# Patient Record
Sex: Male | Born: 1965 | Race: White | Hispanic: No | Marital: Single | Smoking: Never smoker
Health system: Southern US, Community
[De-identification: ages and names within clinical notes are randomized; demographics above are authoritative.]

## PROBLEM LIST (undated history)

## (undated) DIAGNOSIS — K219 Gastro-esophageal reflux disease without esophagitis: Secondary | ICD-10-CM

## (undated) DIAGNOSIS — G8929 Other chronic pain: Secondary | ICD-10-CM

## (undated) DIAGNOSIS — I10 Essential (primary) hypertension: Secondary | ICD-10-CM

## (undated) DIAGNOSIS — I614 Nontraumatic intracerebral hemorrhage in cerebellum: Secondary | ICD-10-CM

## (undated) DIAGNOSIS — R519 Headache, unspecified: Secondary | ICD-10-CM

## (undated) DIAGNOSIS — I2119 ST elevation (STEMI) myocardial infarction involving other coronary artery of inferior wall: Secondary | ICD-10-CM

## (undated) DIAGNOSIS — M545 Low back pain: Secondary | ICD-10-CM

## (undated) DIAGNOSIS — R51 Headache: Secondary | ICD-10-CM

## (undated) DIAGNOSIS — I639 Cerebral infarction, unspecified: Secondary | ICD-10-CM

## (undated) HISTORY — PX: BACK SURGERY: SHX140

---

## 2004-04-09 DIAGNOSIS — I639 Cerebral infarction, unspecified: Secondary | ICD-10-CM

## 2004-04-09 HISTORY — DX: Cerebral infarction, unspecified: I63.9

## 2004-04-09 HISTORY — PX: VENTRICULOSTOMY: SUR1435

## 2004-04-09 HISTORY — PX: OTHER SURGICAL HISTORY: SHX169

## 2004-05-10 HISTORY — PX: TRACHEOSTOMY: SUR1362

## 2004-05-15 ENCOUNTER — Ambulatory Visit: Payer: Self-pay | Admitting: Cardiology

## 2004-05-16 ENCOUNTER — Ambulatory Visit: Payer: Self-pay | Admitting: Physical Medicine & Rehabilitation

## 2004-05-16 ENCOUNTER — Ambulatory Visit: Payer: Self-pay | Admitting: Critical Care Medicine

## 2004-05-16 ENCOUNTER — Encounter (INDEPENDENT_AMBULATORY_CARE_PROVIDER_SITE_OTHER): Payer: Self-pay | Admitting: Specialist

## 2004-05-16 ENCOUNTER — Inpatient Hospital Stay (HOSPITAL_COMMUNITY): Admission: EM | Admit: 2004-05-16 | Discharge: 2004-06-15 | Payer: Self-pay | Admitting: Emergency Medicine

## 2004-05-19 ENCOUNTER — Encounter: Payer: Self-pay | Admitting: Cardiology

## 2004-06-07 ENCOUNTER — Ambulatory Visit: Payer: Self-pay | Admitting: Pulmonary Disease

## 2004-06-15 ENCOUNTER — Inpatient Hospital Stay (HOSPITAL_COMMUNITY)
Admission: AD | Admit: 2004-06-15 | Discharge: 2004-08-04 | Payer: Self-pay | Admitting: Physical Medicine & Rehabilitation

## 2004-06-15 ENCOUNTER — Ambulatory Visit: Payer: Self-pay | Admitting: Physical Medicine & Rehabilitation

## 2004-08-11 ENCOUNTER — Ambulatory Visit: Payer: Self-pay | Admitting: Family Medicine

## 2004-09-01 ENCOUNTER — Inpatient Hospital Stay (HOSPITAL_COMMUNITY): Admission: EM | Admit: 2004-09-01 | Discharge: 2004-09-03 | Payer: Self-pay | Admitting: Emergency Medicine

## 2004-09-01 ENCOUNTER — Encounter
Admission: RE | Admit: 2004-09-01 | Discharge: 2004-11-30 | Payer: Self-pay | Admitting: Physical Medicine & Rehabilitation

## 2004-09-03 ENCOUNTER — Ambulatory Visit: Payer: Self-pay | Admitting: Internal Medicine

## 2004-09-05 ENCOUNTER — Ambulatory Visit: Payer: Self-pay | Admitting: Physical Medicine & Rehabilitation

## 2004-09-06 ENCOUNTER — Ambulatory Visit (HOSPITAL_COMMUNITY)
Admission: RE | Admit: 2004-09-06 | Discharge: 2004-09-06 | Payer: Self-pay | Admitting: Physical Medicine & Rehabilitation

## 2004-10-19 ENCOUNTER — Ambulatory Visit: Payer: Self-pay | Admitting: Physical Medicine & Rehabilitation

## 2004-11-09 ENCOUNTER — Emergency Department (HOSPITAL_COMMUNITY): Admission: EM | Admit: 2004-11-09 | Discharge: 2004-11-09 | Payer: Self-pay | Admitting: Emergency Medicine

## 2005-01-22 ENCOUNTER — Encounter
Admission: RE | Admit: 2005-01-22 | Discharge: 2005-04-22 | Payer: Self-pay | Admitting: Physical Medicine & Rehabilitation

## 2005-01-22 ENCOUNTER — Ambulatory Visit: Payer: Self-pay | Admitting: Physical Medicine & Rehabilitation

## 2005-04-17 ENCOUNTER — Ambulatory Visit: Payer: Self-pay | Admitting: Physical Medicine & Rehabilitation

## 2005-04-20 IMAGING — CR DG ABDOMEN 1V
1 series · 1 of 1 positions shown · non-contrast
Comparison: 06/16/04

CLINICAL DATA: 38 year-old male with nausea and vomiting 
 TMAOXYE-5 VIEW:

[t abdomen supine *]
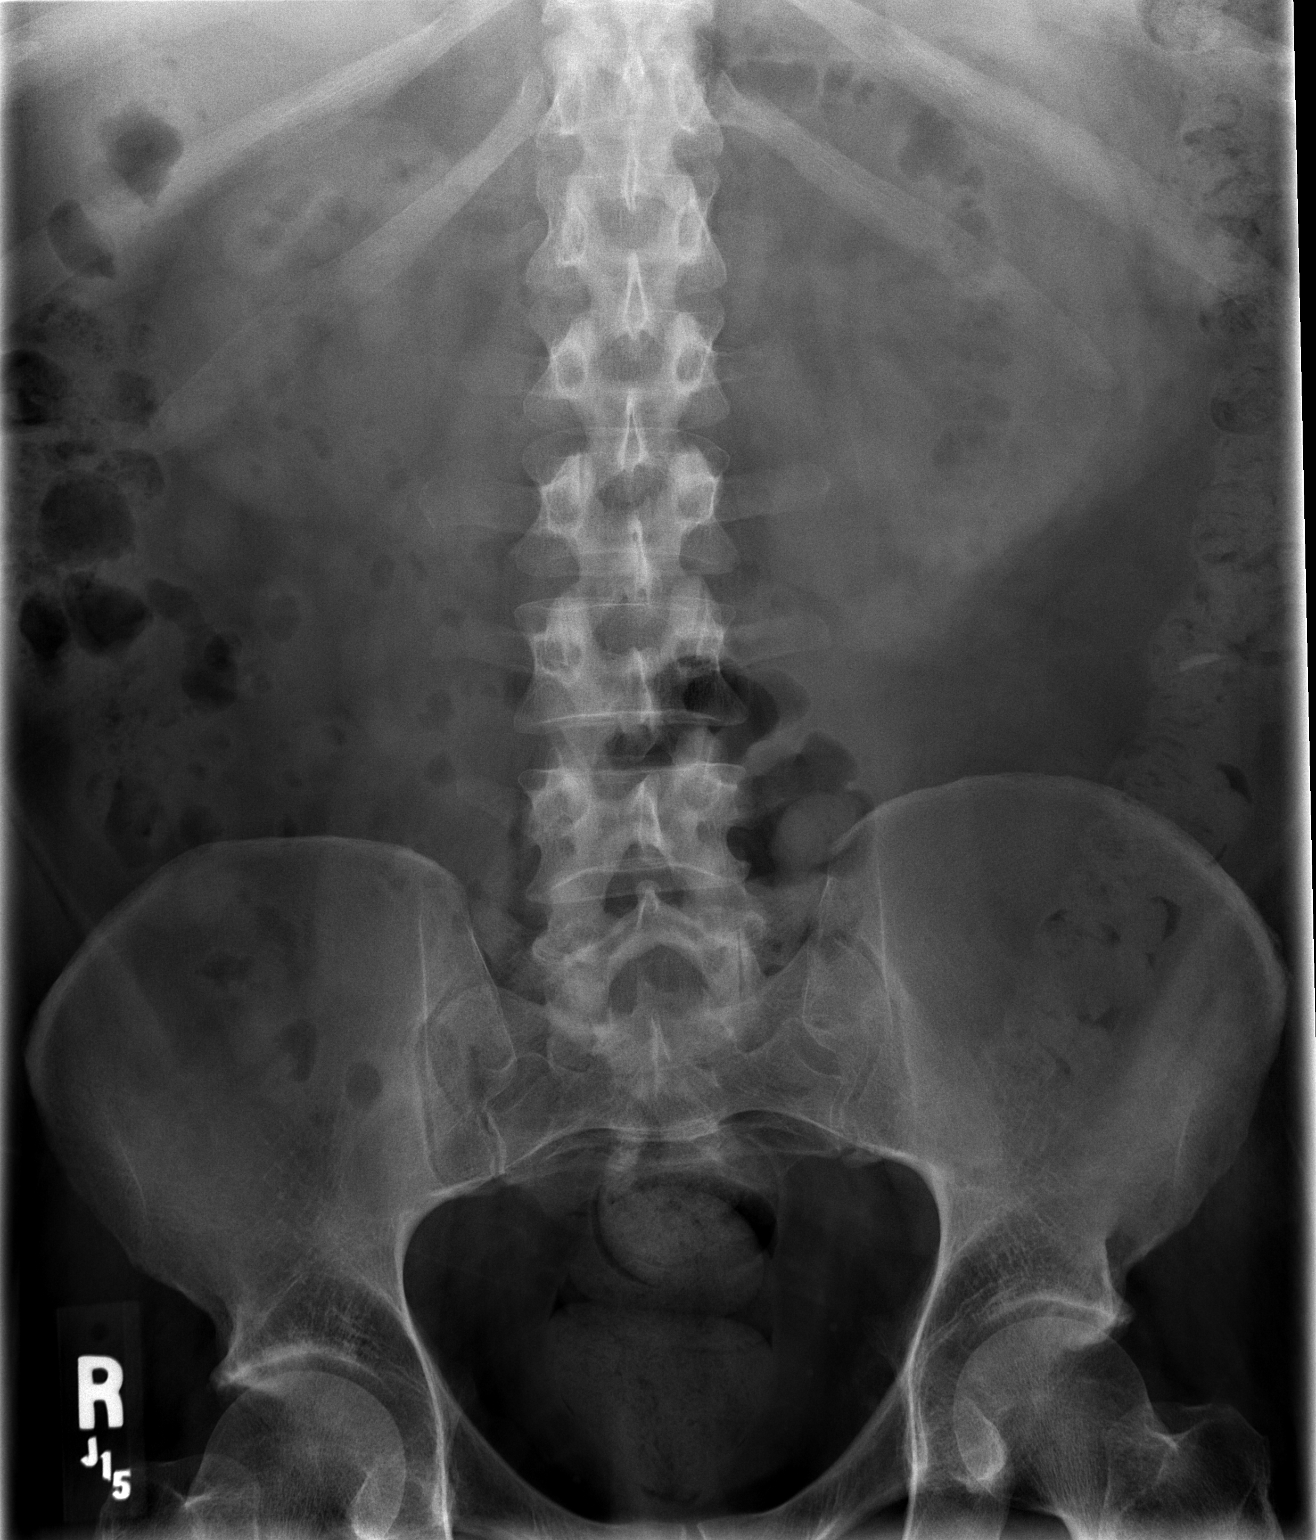

[1 of 1 positions shown; findings below may reference images not displayed]

FINDINGS: No bowel dilatation or obstruction.  Mild constipation throughout the colon.  No abnormal calcifications over the kidneys.
IMPRESSION: 1.  No obstruction or dilatation.
 2.  Mild constipation.

## 2005-09-12 ENCOUNTER — Ambulatory Visit: Payer: Self-pay | Admitting: Family Medicine

## 2005-10-16 ENCOUNTER — Ambulatory Visit: Payer: Self-pay | Admitting: Physical Medicine & Rehabilitation

## 2005-10-16 ENCOUNTER — Encounter
Admission: RE | Admit: 2005-10-16 | Discharge: 2006-01-14 | Payer: Self-pay | Admitting: Physical Medicine & Rehabilitation

## 2005-12-27 ENCOUNTER — Ambulatory Visit: Payer: Self-pay | Admitting: Family Medicine

## 2006-01-28 ENCOUNTER — Ambulatory Visit: Payer: Self-pay | Admitting: Family Medicine

## 2006-03-26 ENCOUNTER — Encounter
Admission: RE | Admit: 2006-03-26 | Discharge: 2006-06-24 | Payer: Self-pay | Admitting: Physical Medicine & Rehabilitation

## 2006-03-26 ENCOUNTER — Ambulatory Visit: Payer: Self-pay | Admitting: Physical Medicine & Rehabilitation

## 2006-05-09 ENCOUNTER — Ambulatory Visit: Payer: Self-pay | Admitting: Family Medicine

## 2007-02-27 ENCOUNTER — Emergency Department (HOSPITAL_COMMUNITY): Admission: EM | Admit: 2007-02-27 | Discharge: 2007-02-27 | Payer: Self-pay | Admitting: Emergency Medicine

## 2007-06-13 ENCOUNTER — Emergency Department (HOSPITAL_COMMUNITY): Admission: EM | Admit: 2007-06-13 | Discharge: 2007-06-13 | Payer: Self-pay | Admitting: Family Medicine

## 2007-11-25 ENCOUNTER — Emergency Department (HOSPITAL_COMMUNITY): Admission: EM | Admit: 2007-11-25 | Discharge: 2007-11-25 | Payer: Self-pay | Admitting: Emergency Medicine

## 2008-03-21 ENCOUNTER — Emergency Department (HOSPITAL_COMMUNITY): Admission: EM | Admit: 2008-03-21 | Discharge: 2008-03-21 | Payer: Self-pay | Admitting: Emergency Medicine

## 2008-03-22 ENCOUNTER — Emergency Department (HOSPITAL_COMMUNITY): Admission: EM | Admit: 2008-03-22 | Discharge: 2008-03-22 | Payer: Self-pay | Admitting: Emergency Medicine

## 2009-09-04 ENCOUNTER — Emergency Department (HOSPITAL_COMMUNITY): Admission: EM | Admit: 2009-09-04 | Discharge: 2009-09-04 | Payer: Self-pay | Admitting: Emergency Medicine

## 2010-08-25 NOTE — H&P (Signed)
NAME:  George Villa, George Villa NO.:  [id number]   MEDICAL RECORD NO.:  [id number]          PATIENT TYPE:  INP   LOCATION:  1826                         FACILITY:  [facility]   PHYSICIAN:  Hewitt Shorts, M.D.DATE OF BIRTH:  05/15/2006   DATE OF ADMISSION:  05/15/2004  DATE OF DISCHARGE:                                HISTORY & PHYSICAL   HISTORY OF PRESENTING ILLNESS:  This patient is a white man in his late 3s  who is brought in by EMS after having vomited earlier this evening and  having been found with decreased response.  The patient was brought to the  emergency room by EMS as noted above and his initial blood pressure per EMS  was 220/170.  He was evaluated in the emergency room by Dr. Bruce Donath who  found decreased response, weakness on the left side and garbled speech.  He  obtained laboratories and a CT scan of the brain.  CT revealed a large right  cerebellar hematoma with early hydrocephalus and neurosurgical consultation  was requested.  At that time Dr. Freida Busman felt that the patient's response  continued to decline and proceeded with intubation of the patient having  given him etomidate.   Further history is rather limited; however, he apparently lives with a  couple and their child.  The patient had been found by the wife of the  couple to have vomited and then she found him with decreased response.  They  know that he has a history of hypertension and has had a previous  hospitalization for that.  The husband of the couple believes that he also  has had some heart problems, but he is unsure of any medications that he  takes or of any allergies; and, he is not aware of other medical conditions  such as diabetes, cancer, previous stroke, peptic ulcer disease, or lung  disease.  He is unaware of previous surgeries.  He is unaware of any  allergies to medications and he is unsure of any medications the patient  takes on a regular basis.   FAMILY HISTORY:  The  family history is limited as well.  According to the  husband of the couple with whom he lives the patient's parents are living as  are three of his brothers, but no medical information regarding them is  available.   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   REVIEW OF SYSTEMS:  The review of systems is unobtainable.   PHYSICAL EXAMINATION:  GENERAL APPEARANCE:  The patient is a well-developed,  well-nourished white male intubated on a ventilator.  VITAL SIGNS:  Temperature is 97.1 and pulse 89.  Blood pressure on arrival  was 153/132 and is now189/122.  LUNGS:  The lungs are clear to auscultation.  He has symmetrical expiratory  excursion.  HEART:  The heart has a regular rate and rhythm.  S1 and  S2.  There is no  murmur.  ABDOMEN:  The abdomen is soft, nondistended and nontender.  Bowel sounds are  diminished.  EXTREMITIES:  The extremity examination shows no clubbing, cyanosis or  edema.  NEUROLOGIC:  Neurological examination shows on mental status he opens his  eyes to stimulation and follows some simple commands. Pupils; on the left  2.5 and on the right it is 1.5, and they are round and react to light.  He  has a right VITH nerve paresis.  Corneals are present, but more vigorous on  the left than the right.  Motor examination reveals significant left  hemiparesis.   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   PLAN:  The patient is admitted to be taken to the operating room for  evaluation of the hematoma and placement of an intraventricular catheter  with subsequent intensive care unit care.   I spoke with the husband of the family that the patient lives with as well  as another friend.   No family of the patient was available.  The patient's  care is emergent at this time and his prognosis is guarded.      RWN/MEDQ  D:  05/16/2004  T:  05/16/2004  Job:  [id number]

## 2010-08-25 NOTE — Assessment & Plan Note (Signed)
HISTORY OF PRESENT ILLNESS:  George Villa is back regarding his right cerebellar  hematoma. He was on the rehabilitation floor for approximately 1 months  time. He was discharged to home about 3 weeks ago. Since he has been home,  he has had 2 episodes where he became unresponsive. The last one was  Thursday of last week. We advised the patient to do to the emergency room.  He was admitted for 3 days at [facility] where his blood pressure  medications were adjusted. It was felt that he had orthostasis leading up to  these problems. He did not have an EEG performed. The patient apparently had  his Avapro reduced to 150 mg a day and his Clonidine patch was reduced  possibly to 0.2 mg. The patient has been progressing from a therapy  standpoint. He was discharged from home therapies. He has been walking with  a weighted walker. He has not fallen as of yet. He is still living with a  roommate. His brothers remain very supportive. The patient denies any pain.  He has had no headaches. Sleeping fairly well. They tried to remove his  Scopolamine patch a couple of weeks ago but then his nausea resurfaced. The  patient is able to walk about 15 minutes without stopping. He is independent  with all of his mobility and home needs.   REVIEW OF SYSTEMS:  The patient reports periodic dizziness. Denies any  constitutional, genitourinary, gastrointestinal, cardiorespiratory problems.   SOCIAL HISTORY:  The patient is divorced and lives with his friend, as  mentioned above.   PHYSICAL EXAMINATION:  VITAL SIGNS:  Blood pressure 108/57, pulse 97,  respiratory rate 18, sating 97% on room air.  GENERAL:  The patient is pleasant. No acute distress.  NEUROLOGIC:  He is alert and oriented x3. He seems to lack some social  awareness. He occasionally confabulates. Gait was examined with a walker  today and he did well on a straight forward walking pattern. He had problems  with changing directions. He was a  bit impulsive with his transfers and  reached for the walker instead of the side arms of the chair. Speech was  clear and articulate. Coordination was decreased particularly involving the  right hand. The patient did tend to drift to the right when he walked.  Reflexes were hyperactive on the right side. As mentioned above, the patient  lacks awareness in memory. Seemed to have better insight and a little less  impulsive than previously when I examined him but remains impulsive  nonetheless. Cranial nerve examination is grossly intact.  HEART:  Regular rate and rhythm.  LUNGS:  Clear.  ABDOMEN:  Soft, nontender.  EXTREMITIES:  Sensory examination is grossly intact on the right side.   ASSESSMENT:  1. 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  2. History of vestibular symptoms secondary to right cerebellar      hemorrhage.  3. Syncope/hypovolemia, secondary to blood pressure medications.     PLAN:  1. Continue Avapro at a 75 mg dose once daily.  2. Continue other medications per [facility] discharge to      include:      a. Maxzide 25/37.5 once daily.      b. Catapres 0.3 mg daily (which apparently was not decreased).      c. Baclofen 10 mg b.i.d.      d. Protonix 40 mg q.d.      e. Ditropan 2.5 mg t.i.d.  f. Labetalol 200 mg b.i.d.      g. Scopolamine patch q. 3 days for nausea.  3. I will send the patient for an electroencephalogram to rule out seizure      activity, although this is precautionary more than anything else.  4. Will see the patient back in about 4 to 6 weeks time to assess blood      pressure and neurologic status.  5. Encouraged better safety judgment.  6. I discussed the situation with both the patient and his brother.        ZTS/MedQ  D:  09/05/2004 15:12:55  T:  09/05/2004 16:22:05  Job #:  [id number]   cc:   Hewitt Shorts, M.D.  [address]  [place]  [place] [address]  Fax: [phone]

## 2010-08-25 NOTE — Procedures (Signed)
CLINICAL HISTORY:  The patient is a 45 year old gentleman who had a stroke  Februaryof 2000. He is right handed and walks with a walker. The patient has  had two episodes of unresponsiveness that lasted for seconds. EEG was done  to look for the presence of a seizure disorder.   PROCEDURE:  The tracing was carried out on a 32-channel digital Cadwell  recorder reformatted into 16 channel montages with one devoted to EKG. The  patient was awake and drowsy during the recording. The international 10/20  system lead placement was used. Medications were not specified in the  information available to me at the time of this dictation.   DESCRIPTION OF FINDINGS:  The dominant frequency is a 10 Hz, 25 Mv activity  that is well regulated, broadly distributed and attenuates partially with  eye opening.   Background activity shows occasional dysrhythmic theta and polymorphic delta  range activity over the left anterior temporal region that is not seen on  the right.   The patient becomes drowsy with a more generalized theta range activity with  occasional upper delta range figures. Light natural sleep was not achieved.   There was no interictal epileptiform activity in the form of spikes or sharp  waves. Activating procedures did not cause significant driving response for  photic stimulation or slowing of the background for hyperventilation.   EKG showed a regular sinus rhythm with ventricular response of 60 beats per  minute.   IMPRESSION:  Abnormal EEG on the basis of mild left-sided slowing that may  correlate with the patient's history of seizures. No seizure activity was  seen in this record.       EAV:WUJW  D:  09/06/2004 15:33:55  T:  09/06/2004 16:29:36  Job #:  [id number]   cc:   Ranelle Oyster, M.D.  [address] [place]  [place] [address]  Fax: [phone]

## 2010-08-25 NOTE — Consult Note (Signed)
NAME:  GROVER, ROBINSON NO.:  [id number]   MEDICAL RECORD NO.:  [id number]          PATIENT TYPE:  INP   LOCATION:  3111                         FACILITY:  [facility]   PHYSICIAN:  Zola Button T. Lazarus Salines, M.D. DATE OF BIRTH:  October 14, 1965   DATE OF CONSULTATION:  05/22/2004  DATE OF DISCHARGE:                                   CONSULTATION   CHIEF COMPLAINT:  Cerebellar hematoma.   HISTORY:  This 45 year old white male was found unresponsive at home and was  brought into the hospital and discovered to have a cerebellar hematoma. On  the day of admission, he was intubated and taken to the operating room where  the hematoma was evacuated and an intraventricular catheter placed. The  patient has been intubated and ventilated since February 6, one week ago. He  is having a very slow neurologic recovery. It is anticipated that he will  require substantial time with his neurologic recovery, and furthermore with  his ventilator wean, not to mention airway protection. ENT was consulted for  possible tracheostomy.   PHYSICAL EXAMINATION:  This is a young middle-aged white male who is  minimally responsive. He has an orotracheal tube in place and a left  nasogastric feeding tube. The neck anatomy is palpably normal.   IMPRESSION:  Prolonged intubation with anticipated prolonged wean and  neurologic recovery.   PLAN:  Agree with need for tracheostomy later this week. I will attempt to  speak with the family to obtain a consent. I will write orders when we have  the specific date.      KTW/MEDQ  D:  05/22/2004  T:  05/22/2004  Job:  [id number]   cc:   Hewitt Shorts, M.D.  [address]  [place]  [place] [address]  Fax: [phone]   Shan Levans, M.D. [facility]

## 2010-08-25 NOTE — Discharge Summary (Signed)
NAME:  George Villa, George Villa NO.:  [id number]   MEDICAL RECORD NO.:  [id number]          PATIENT TYPE:  IPS   LOCATION:  4006                         FACILITY:  [facility]   PHYSICIAN:  Ranelle Oyster, M.D.DATE OF BIRTH:  1965-07-13   DATE OF ADMISSION:  06/15/2004  DATE OF DISCHARGE:  08/04/2004                                 DISCHARGE SUMMARY   DISCHARGE DIAGNOSES:  1.  Right cerebellar hematoma.  2.  Dysphagia.  3.  Tracheostomy, May 23, 2004, decannulated on June 12, 2004.  4.  Hypertension.   PROCEDURES:  Status post right frontal inferior vena cava catheter and  suboccipital craniectomy with microdissection, May 16, 2004, of right  cerebellar hematoma.   HISTORY OF PRESENT ILLNESS:  This is a 45 year old white male with history  of uncontrolled hypertension who was admitted May 16, 2004 with altered  mental status with nausea and vomiting.  Blood pressure diastolic 122.  Cranial CT scan with a large right cerebellar hematoma with early  hydrocephalus.  He underwent right frontal IVC catheter and suboccipital  craniectomy with microdissection, May 16, 2004, per Dr. Newell Coral.  Hospital course with prolonged ventilator support.  Tracheostomy, May 23, 2004, per Dr. Zola Button T[facility]; patient decannulated, June 12, 2004,  without difficulty.  Serial followup modified barium swallows, initially  n.p.o. with nasogastric tube feeds.  During his hospital course, he was  treated for a pneumonia with Critical Care Medicine followup.  Latest  cranial CT scan, June 12, 2004:  No hydrocephalus and stable findings.  Restlessness initially with use of Vail bed for safety.  A PICC line was  discontinued June 14, 2004.  The patient pulled out his Panda tube.  Followup FEES (swallow study) with plan to advance diet.  He was admitted  for a comprehensive rehab program.   PAST MEDICAL HISTORY:  1.  Uncontrolled hypertension.  2.  Occasional alcohol.  3.   No tobacco.   ALLERGIES:  None.   SOCIAL HISTORY:  Lives with roommate.  He owns a [employer].  Elderly mother with limited assistance, plans to home with his brother and  sister-in-law with assistance as needed.   MEDICATIONS PRIOR TO ADMISSION:  None.   HOSPITAL COURSE:  Patient with progressive gains while on rehab services  with therapies initiated on a b.i.d. basis.  The following issues were  followed during patient's rehab course:  Concerning Mr. Harmening right  cerebellar hematoma with right frontal IVC catheter, suboccipital  craniectomy, May 16, 2004, surgical site had healed nicely, followup per  neurosurgery was advised.  His latest followup cranial CT scan, June 28, 2004, showed resolved intraventricular hemorrhage, no hydrocephalus, mild  encephalomalacia involving the right cerebellum.  There was a tiny amount of  residual pneumocephalus.  Initially, the patient was using a Vail bed for  safety, still needing some cues for using his call bell, Vail bed was later  discontinued, side rails x4 for patient's safety.  His overall speech  continued to improve; he was still somewhat slow to initiate at times with  his therapies.  He  was ambulating minimal assistance, monitoring of his  balance.  Ongoing therapies would be noted at time of his discharge.  His  diet has since been advanced to regular, tolerating well with good intake.  Blood pressure was monitored on clonidine patch, hydrochlorothiazide, Avapro  and Normodyne, diastolic pressure 60-70.  The patient did not have a primary  MD prior to hospital admission; orders were given for Case Management to  assist in providing a primary care physician on discharge to monitor  patient's hypertension.  During his rehab course, he had some bouts of  nausea and vomiting with workup leading to cranial CT scan, as noted above,  showing no acute findings.  He was placed on Reglan for a short time; this  was slowly  weaned.  A scopolamine patch was later provided with good  results.  Workup during his nausea and vomiting for amylase and lipase  levels was all within normal limits.  His nausea had greatly improved the  final 2 weeks prior to the patient's discharge from the hospital.  Initially, it was felt that the patient would need a skilled nursing  facility for discharge, although his mobility improved; it was discussed  with family.  He did receive family weekend passes that went well.  It was  felt by his brother that he could provide the necessary care and supervision  on discharge, thus a date was set for August 04, 2004 on discharge to home.   Latest labs showed a hemoglobin of 14, hematocrit 41.6, sodium 143,  potassium 3.5, BUN 13, creatinine 1.1.   DISCHARGE MEDICATIONS AT TIME OF DICTATION:  1.  Clonidine patch 0.3 mg, change every 7 days.  2.  Protonix 40 mg daily.  3.  Baclofen 10 mg three times daily.  4.  Hydrochlorothiazide 25 mg daily.  5.  Avapro 300 mg daily.  6.  Scopolamine patch 1.5 mg -- change every 72 hours.  7.  Normodyne 200 mg twice daily.  8.  Ditropan 2.5 mg three times daily.  9.  Tylenol as needed.  10. Vicodin as needed for breakthrough pain.   ACTIVITY:  Activity as tolerated with supervision for safety.   DIET:  Regular.   SPECIAL INSTRUCTIONS:  Ongoing therapies as dictated per rehab services.   FOLLOWUP:  The patient would follow up with Dr. Faith Rogue at the  outpatient rehab service office as advised, follow up with Dr. Newell Coral,  Neurosurgery, [phone], Case Management to assist in locating a primary MD  for patient on discharge.      DA/MEDQ  D:  08/03/2004  T:  08/03/2004  Job:  [id number]   cc:   Hewitt Shorts, M.D.  [address]  [place]  [place] [address]  Fax: [phone]   [facility]

## 2010-08-25 NOTE — Assessment & Plan Note (Signed)
Wednesday, March 27, 2006   George Villa is back regarding his ataxia.  He continues to walk with his  cane.  He has been working out at [employer] and has felt this has been  beneficial for his balance and strength as well as mood.  He remains on  a Catapres patch and Avapro as well as HCTZ for a blood pressure.  He is  off of labetalol completely.  The blood pressure has remained stable.  He continues to require his scopolamine patch for balance and nausea.  Without it he has significant nausea and vomiting.  He has not made a  lot of attempts to pursue vocational efforts at this point.  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   REVIEW OF SYSTEMS:  Patient denies any new neurological, psychiatric,  constitutional, GU, GI, cardiorespiratory complaints today.  Full review  is in the health and history section.   SOCIAL HISTORY:  As noted.  The patient is divorced.   PHYSICAL EXAMINATION:  Blood pressure 138/67.  Pulse is 90, respiratory  rate 16.  Satting 97% on room air.  Patient is pleasant, no acute distress.  He walks without his cane and tends to lean to the right still and walk  with the right leg externally rotated for balance.  He had some problems  in the change of direction.  He was not at a high risk to fall.  With  the cane he is much more stable.  He has limb ataxia to the right side  as well as pronator drift.  No vertigo is seen or nystagmus noted today.  Cognitively he is more appropriate with better awareness and attention.  HEART:  Was regular.  CHEST:  Was clear.  ABDOMEN:  Soft, nontender.  Patient appears to have lost weight.   ASSESSMENT:  1. Right cerebral hemorrhage, status post occipital craniotomy.  2. History of syncope and hypovolemia.  3. Hypertension.  4. Vestibular symptoms secondary to number 1.   PLAN:  1. Continue Avapro, HCTZ and Catapres.  2. I had hoped to taper off of scopolamine but apparently  he will      require this long-term.  He can use scopolamine patch every 3 days      for now.  3. Encourage ongoing strengthening exercises.  4. I have asked him to start looking at the vocational opportunities      over the next year, which he agreed to.  5. I will see the patient back on a p.r.n. basis.  At this point I      have nothing new to offer.  He will follow up with Dr. Audria Nine at      [facility].      Ranelle Oyster, M.D.  Electronically Signed     ZTS/MedQ  D:  03/27/2006 12:47:14  T:  03/27/2006 15:59:31  Job #:  [id number]   cc:   Maurice March, M.D.  Fax: [phone]

## 2010-08-25 NOTE — Discharge Summary (Signed)
NAME:  George Villa, George Villa NO.:  [id number]   MEDICAL RECORD NO.:  [id number]          PATIENT TYPE:  INP   LOCATION:  3105                         FACILITY:  [facility]   PHYSICIAN:  Hewitt Shorts, M.D.DATE OF BIRTH:  1966-02-01   DATE OF ADMISSION:  05/16/2004  DATE OF DISCHARGE:  06/15/2004                                 DISCHARGE SUMMARY   HISTORY OF PRESENT ILLNESS:  The patient is a 45 year old man who was  brought to the emergency room after having been found unresponsive after  having vomited.  The patient was evaluated by the emergency room physician.  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  The patient's response continues to  decline in the emergency room and Dr. Freida Busman, the emergency room physician,  intubated the patient and obtained neurosurgical consultation.  Further  history was limited.   Examination showed patient intubated on a ventilator, pulse 89, blood  pressure 189/122.  GENERAL EXAMINATION:  Was otherwise unremarkable.  NEUROLOGIC EXAMINATION:  Showed the patient opened his eyes to stimulation  and did follow commands.  Pupils: Left was 2.5 and the right was 1.5, they  were round and reactive to light.  There was a right 6th nerve paresis,  corneals were present left greater than right, and there was a left  hemiparesis.   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  The patient was taken to  the operating room and we placed a right frontal intraventricular catheter  and proceeded with a suboccipital craniectomy and evacuation of the  cerebellar hematoma.  Postoperatively, the patient was transferred to the  neurosurgical intensive care unit, intubated and on a ventilator.  This did  prevent wake-up, patient would open his eyes to voice.  Plus, minus follow  commands weakly on the right with  little movement on the left.  At that  time, there was only a small amount of intraventricular catheter drainage.  Critical care medicine was consulted to assist with ventilator management,  blood pressure management and so on, and the patient was seen in critical  care medicine consultation by Dr. Danise Mina.  Followup CT scan showed good  evacuation of the cerebellar hematoma, good decompression of the  hydrocephalus with the intraventricular catheter in good position.  The  patient has some hypokalemia which was corrected.  The patient continued to  have anisocoria.  He would open  his eyes to voice, left gaze tendency, left  hemiparesis, following commands on the right.  Continued on the ventilator  with critical care medicine assistance in his overall management.  Intraventricular catheter continued to drain well.  Followup CT scan 6 days  after initial presentation and surgery showed continued good evacuation of  the cerebellar hematoma, ventricles well decompressed with evidence of old  strokes including a large right lacunar stroke and a smaller left lacunar  stroke.  We began to raise the intraventricular catheter with plan for  subsequent followup CT.  The patient continued on ventilator  support.  Followup CT showed increased ventricular size, we lowered the  intraventricular catheter and set up for another followup CT.  Dr. Lazarus Salines,  from ENT, was consulted regarding the tracheostomy because of prolonged  intubation.  The patient underwent a successful tracheostomy by Dr. Lazarus Salines  and continued on the ventilator.  However, we were able to subsequently wean  the patient from the ventilator and he was changed over to a trach collar.  The intraventricular catheter continued to drain well.  The patient  continued to follow commands on the right and remained left hemiparetic.  Wound healed well and we were able to remove the patient's staples.  Followup CT scan showed that the  ventricles had decreased in size with  lowering the intraventricular catheter.  A few days later we again tried to  raise the intraventricular catheter and followup CT scan subsequently showed  the ventricles to be stable in size.  It was decided to clamp the  intraventricular catheter.  The patient's neurologic response, roughly 2  weeks after presentation, continued to improve, following commands on the  right, opening his eyes and occasional movement on the left.  Physical  therapy and occupational therapy consultations were obtained.  The patient  continued on a trach collar.  The IVC was clamped for over 24 hours.  CT  scan showed stable ventricular size without hydrocephalus and we removed the  intraventricular catheter and the exit site was sutured closed.  The patient  had had Panda tube placed, and he was supported with tube feedings as well  as free water.  Followup CT scan several days after removing the  intraventricular catheter showed no evidence of hydrocephalus and the  patient continued with physical therapy, occupational therapy and speech  therapy.  We consulted physical medicine rehabilitation about comprehensive  inpatient rehabilitation and it was felt that the patient was a good  candidate for that.  We were eventually able to discontinue his Foley and  use a [place] catheter, continued to be followed by critical medicine  throughout the hospitalization.  He is awake, alert, following commands and  moving all of his extremities, right better than left, and patient was  subsequently transferred to the rehabilitation center for further care.  Swallowing evaluation had been scheduled.  Followup CT scan the day before  transfer showed stable ventricular size, no pseudomeningocele.  The patient  was decannulated prior to transfer to rehabilitation and tolerated that well and had steadily improving voice quality and volume, he was oriented to his  name and year and had made  substantial recovery.      RWN/MEDQ  D:  08/09/2004  T:  08/09/2004  Job:  [id number]

## 2010-08-25 NOTE — Op Note (Signed)
NAME:  NILS, THOR NO.:  [id number]   MEDICAL RECORD NO.:  [id number]          PATIENT TYPE:  INP   LOCATION:  3111                         FACILITY:  [facility]   PHYSICIAN:  Zola Button T. Lazarus Salines, M.D. DATE OF BIRTH:  01-13-1966   DATE OF PROCEDURE:  05/23/2004  DATE OF DISCHARGE:                                 OPERATIVE REPORT   PREOPERATIVE DIAGNOSIS:  Prolonged intubation, vent dependent.   POSTOPERATIVE DIAGNOSIS:  Prolonged intubation, vent dependent.   PROCEDURE PERFORMED:  Tracheostomy.   SURGEON:  Gloris Manchester. Lazarus Salines, M.D.   ANESTHESIA:  General orotracheal converted to general tracheal stomal.   BLOOD LOSS:  Minimal.   COMPLICATIONS:  None.   FINDINGS:  A slightly fatty lower neck with a modest thyroid isthmus.  Relatively large caliber trachea.   PROCEDURE:  With the patient in the comfortable supine position, anesthesia  was administered per indwelling orotracheal tube. At an appropriate level,  the patient was placed in a slightly reversed Trendelenburg, a shoulder roll  was placed, and the neck was extended. The lower midline neck was palpated  with the findings as described above.  1% Xylocaine with 1:100,000  epinephrine, 10 cc total was infiltrated into the lower neck for  intraoperative hemostasis. Several minutes were allowed to this to take  effect. A sterile preparation and draping of the low neck was accomplished.   Half way between the cricoid cartilage and sternal notch, the 4 cm  transverse incision was sharply executed and carried down through skin and  subcutaneous fat.  Small vessels were controlled with cautery and with silk  ligature. The midline raphe of the strap muscles was identified and divided  in two layers and the muscles retracted laterally. The thyroid isthmus was  identified. Thyroid isthmus was isolated between hemostats, divided, and  controlled with 2-0 silk suture ligatures. The anterior face of the trachea  was  cleaned.   In the second and third interspace, the trachea was entered transversely. An  8 mm inferiorly based flap was executed and secured to the lower wound edge  with a 2-0 chromic stitch. The cut mucosal edges were coagulated for  hemostasis.   At this point, the orotracheal tube was gently backed outward and a  previously tested #8 Shiley tracheostomy tube was placed without difficulty.  Ventilation was assumed per tracheostomy tube satisfactorily. The cuff was  inflated and observed to be intact and containing air. The trach dressing  was placed. The trach was secured with cotton twill ties in the standard  fashion. Again hemostasis was observed. At this point the procedure was  completed and the patient was returned to Anesthesia, awakened, and  transferred back to the neurosurgical intensive care unit in stable  condition.   COMMENT:  A 44 year old white male now approximately eight days status post  a intracerebellar bleed secondary to uncontrolled hypertension with  anticipated slow neurologic recovery and prolonged requirements for  ventilator support and pulmonary toilet was the indication for today's  procedure. Anticipate routine postoperative recovery with attention to  standard trache hygiene.  KTW/MEDQ  D:  05/23/2004  T:  05/23/2004  Job:  [id number]   cc:   Hewitt Shorts, M.D.  [address]  [place]  [place] [address]  Fax: [phone]   Shan Levans, M.D. [facility]

## 2010-08-25 NOTE — Discharge Summary (Signed)
NAME:  George Villa, George Villa NO.:  [id number]   MEDICAL RECORD NO.:  [id number]          PATIENT TYPE:  INP   LOCATION:  5525                         FACILITY:  [facility]   PHYSICIAN:  Thomos Lemons, D.O. [facility]   DATE OF BIRTH:  February 23, 1966   DATE OF ADMISSION:  09/01/2004  DATE OF DISCHARGE:  09/03/2004                                 DISCHARGE SUMMARY   DISCHARGE DIAGNOSES:  1.  Syncope/hypovolemia secondary to blood pressure medications.  2.  History of right cerebellar hemorrhage status post suboccipital      craniectomy.  3.  Nausea and vomiting.  4.  Confusion.   DISCHARGE MEDICATIONS:  1.  Maxzide 25/37.5 mg once daily.  2.  Catapres Transdermal 0.3 mg daily.  3.  Baclofen 10 mg twice daily.  4.  Protonix 40 mg once daily.  5.  Ditropan 2.5 mg three times daily.  6.  Labetalol 200 mg twice daily.  7.  Scopolamine transdermal 1 patch q.3 days p.r.n. for nausea.  8.  Avapro 150 mg 1/2 tab once daily.   DISCHARGE INSTRUCTIONS:  The patient is to continue with his physical  therapy and occupational therapy at home and follow up with Dr. Cato Mulligan on  Tuesday, Sep 05, 2004 and we will try to leave a message for Dr. Cato Mulligan that  this patient needs to be seen for hospital followup.  An EEG was not  completed in the hospital, and the patient will need possible EEG and/or  neurologic evaluation as an outpatient.   HOSPITAL COURSE:  The patient is a 45 year old white male who was recently  discharged from [facility] on June 15, 2004 after  prolonged hospitalization for a right cerebellar hemorrhage secondary to  hypertension.  The patient underwent suboccipital craniectomy for  hydrocephalus.  The patient had prolonged ventilatory support.  The patient  was discharged on June 15, 2004 after undergoing rehab.  The patient, after  discharge had one episode of syncope one week later associated with  orthostasis, and had a second episode on admission on Sep 01, 2004.  Both  episodes were associated with orthostasis and the patient denied any  postictal symptoms, no tongue-biting or bowel or bladder incontinence.  The  patient had his blood pressure medications on hold during hospitalization.  His kidney function, which was 1.8 on admission, returned to normal limits.  The patient's blood pressure returned to 130s with 40 systolic.  The patient  on admission was 80/52.   During his hospital course, his roommate related to the nursing staff at  [facility] that he had been acting unusually at home, sometimes making odd  off-the-wall comments, and the nursing staff also noted some confusion while  he was in the hospital.  He asked the nursing staff after hearing an  overhead page, when he was going to the OR.  A physical therapy evaluation  was performed.  The patient was somewhat ataxic in his gait, but states that  he is using a walker at home.  The patient had some episodes of nausea and  vomiting,  as well, and this was apparent while he was undergoing rehab and  was improved with the addition of a Scopolamine patch.   Problem 1. Hypotension/Syncope, most likely secondary to blood pressure  medications.  His Avapro will be decreased to half before discharge.   Problem 2. Status post right cerebellar hemorrhage with some suboccipital  craniectomy.  I would recommend that he obtain an EEG as an outpatient and  arrange for followup with a neurologist.  In addition, he may benefit from  psychiatric testing.      RY/MEDQ  D:  09/03/2004  T:  09/03/2004  Job:  [id number]   cc:   Valetta Mole. Swords, M.D. [facility]

## 2010-08-25 NOTE — Assessment & Plan Note (Signed)
MEDICAL RECORD NUMBER:  [id number].   HISTORY OF PRESENT ILLNESS:  George Villa is back regarding his right cerebellar  hematoma with ataxia, diplopia, nausea and vertigo.  The patient has been at  home without any further problems.  Blood pressures are under good control.  He is on his medications which include Maxzide, Catapres, Labetalol and  Avapro.  He denies any pain or headaches.  Vertigo has been minimal with the  scopolamine patch.  He uses his walker for most movement.  He does  occasionally furniture walk in the house.  He has been in the pool doing  some water walking as well.  He had EGD performed, but I did not receive the  result of this.  He is to require supervision at home.  He has been helping  one of his brothers out at the [employer] doing some odds and ends-type of  work.  He states she can walk about 15-20 minutes without stopping.  The  patient does report some generalized sleepiness.  Denies any urinary  complaints.  Has numbness still in the right hand.   REVIEW OF SYSTEMS:  The patient reports the above.  Denies any  constitutional, GU, GI, cardiorespiratory symptoms.  Mood has been  excellent.  Has been sleeping well.   SOCIAL HISTORY:  The patient would like to get back to his business this  fall.  He works as a Academic librarian.  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   PHYSICAL EXAMINATION:  VITAL SIGNS:  Blood pressure 117/56, pulse 77,  respirations 16, saturating 100% on room air.  GENERAL APPEARANCE:  The patient is pleasant and alert and oriented x3.  NEUROLOGICAL:  Affect is bright and appropriate.  Gait is ataxic, and the  patient easily shifts to the right, ambulating without device.  When he  walks with his weighted walker, he did fairly nicely.  He tended to drift to  the right, but was able to generally compensate and re-center his focus.  He  had approximately 30-40 pounds of weight on his rolling  walker today.  The  patient continued to display generalized ataxia of his right hand and right  leg.  He has diplopia.  No frank nystagmus was seen.  Fine motor  coordination was decreased in the fingers in the right hand.  He has had  decreased proprioception in the right hand as well.  Cognitively, the  patient did have better awareness.  He still lacked some insight.  He was  alert via month and day of the week, and the reason he was here but not the  precise date.  Has insight towards the future.  Showed fair safety awareness  with me today, above and beyond what I have seen in the past.  HEART:  Regular rate and rhythm.  LUNGS:  Clear.  ABDOMEN:  Soft, nontender.   ASSESSMENT:  1.  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  2.  History of vestibular symptoms secondary to #1.  3.  Syncope and hypovolemia secondary to decreased intake and excessive      blood pressure medications.   PLAN:  1.  I refilled multiple medications today as he is doing well with the      current regimen. I  refilled hydrochlorothiazide 25 mg daily, Avapro 300      mg daily, Labetalol 200 mg b.i.d. and Clonidine patch 0.3 mg q.week.  2.  Refilled  his Propylamine patch 1.5 mg q.72h.  We will wean this at      another time.  3.  Due to fatigue, we will try to wean him off the Ditropan which is at 2.5      mg t.i.d.  4.  Change Protonix to p.r.n. dyspepsia.  5.  Encouraged work simulation activities, while avoiding sharp or dangerous      objects or material.  6.  I will see the patient back in about three months time.  Overall, he has      progressed nicely.       ZTS/MedQ  D:  10/23/2004 09:57:30  T:  10/23/2004 12:12:57  Job #:  [id number]   cc:   Hewitt Shorts, M.D.  [address]  [place]  [place] [address]  Fax: [phone]

## 2010-08-25 NOTE — Consult Note (Signed)
NAME:  George Villa, George Villa NO.:  [id number]   MEDICAL RECORD NO.:  [id number]          PATIENT TYPE:  INP   LOCATION:  3111                         FACILITY:  [facility]   PHYSICIAN:  Shan Levans, M.D. LHCDATE OF BIRTH:  1965-07-07   DATE OF CONSULTATION:  05/16/2004  DATE OF DISCHARGE:                                   CONSULTATION   CHIEF COMPLAINT:  Respiratory failure secondary to cerebellar bleeding.   HISTORY OF PRESENT ILLNESS:  This is a 45 year old white male with a  limited history brought into the emergency room by EMS after being found in  a supine position with emesis and decreased responsiveness. He was intubated  in the emergency room by the emergency room physician, CT of the head showed  a large right cerebellar hematoma with early hydrocephalus.  He continued to  decline and was intubated. He had significant hypertension.  Past history we  know of only that he has hypertension.  No other history is known.   PAST MEDICAL HISTORY:  Essentially not know at this time.   PHYSICAL EXAMINATION:  VITAL SIGNS:  Temperature 97, pulse 89, on arrival  was 189/122 now is 160/117. Orally intubated.  CHEST:  Clear to auscultation and percussion with evidence of wheeze, rale  or rhonchi.  CARDIAC:  Showed resting tachycardia without S3, normal S1, S2.  ABDOMEN:  Soft, nontender.  EXTREMITIES:  Showed no edema or clubbing.  SKIN:  Clear.  NEUROLOGIC:  The patient is unresponsive on a Diprivan drift. When the  Diprivan though is reduced, he is able to hold up one finger to response.  SKIN:  Clear.  HEENT:  Orally intubated, no jugular venous distention, no lymphadenopathy.   LABORATORY DATA:  Initial blood gas, pH 742, pCO2 36, PO2 74.   Chest x-ray showed no active disease process, endotracheal tube in adequate  position. EKG normal sinus rhythm, LVH is noted.  Hemoglobin of 15.4, white  count of 15,000, platelet count 335, sodium 142, potassium 2.5,  chloride  111, CO2 21, BUN 14, creatinine 1.7, blood sugar 165, calcium 8.4.  The  patient is on Diprivan drip.   IMPRESSION:  Hypertensive bleeds in the cerebellar location with no other  known past history, associated pyuria and increased urinary sediment is  noted.  Ventilator dependent respiratory therapy, hypertension poorly  controlled and hyperglycemia.   RECOMMENDATIONS:  Full vent support, placed SCD's to the legs, H2 blocker  IV, CCM hyperglycemia protocol. __________ drip, Diprivan for sedation.      PW/MEDQ  D:  05/16/2004  T:  05/16/2004  Job:  [id number]   cc:   Hewitt Shorts, M.D.  [address]  [place]  [place] [address]  Fax: [phone]

## 2010-08-25 NOTE — Assessment & Plan Note (Signed)
MEDICAL RECORD NUMBER:  [id number].   George Villa is back regarding his right cerebellar hematoma. The patient is  doing fairly well at home. He is using his walker and is independent with  this. He feels that his dizziness is improving to some extent. He can  furniture walk for short distances at home. He has not fallen although he  may have had some close calls. Blood pressure seems to be under fair  control. He still requires a scopolamine patch to avoid nausea, although at  times when he has tried coming off of it, his symptoms have been a bit less.  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 The patient denies any pain today. He is  sleeping well and has good appetite.   REVIEW OF SYSTEMS:  The patient denies any other neurological or psychiatric  complaints. He has no constitutional, GU, GI or cardiopulmonary problems  today.   SOCIAL HISTORY:  Pertinent positives are listed above. He lives with his  brother.   PHYSICAL EXAMINATION:  VITAL SIGNS:  Blood pressure is 100/56, pulse 88,  respiratory rate 16. He is saturating 98% on room air.  GENERAL:  The patient is pleasant in no acute distress. He is alert and  oriented x3. Affect is generally bright and appropriate. He is appears to  have lost weight. Gait is slightly shuffling and is ataxic although it is  improved. He does very nicely with his walker. He is actually worn down the  back end of his walker by about 4 to 5 inches and now has tennis balls on  the bottom to protect. He seems to change directions fairly well with his  walker, even in the altered state. Coordination remains decreased  bilaterally. Sensation is fairly normal. Motor function is 5/5.  HEART:  Regular rate and rhythm.  LUNGS:  Clear.  ABDOMEN:  Soft, nontender.  NEUROLOGICAL:  On cranial nerve examination, the patient  has no obvious  nystagmus, has good visual fields. He remains a bit impulsive and seems to  lack some awareness although this is improving as well.   ASSESSMENT:  1.  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  2.  History of vestibular symptoms secondary to #1.  3.  Syncope and hypovolemia secondary to blood pressure medications and      poor intake which seems to be stable.   PLAN:  1.  He will continue with his current blood pressure medications including      Avapro 300 mg daily, hydrochlorothiazide 25 mg daily, labetalol 200 mg      b.i.d., and clonidine patch 0.3 mg every week. I would like to decrease      the labetalol potentially at the next visit.  2.  Continue scopolamine patch 1.5 mg q.72h.  3.  I wrote him a prescription for a new walker.  4.  I will see him back in about three months' time. He continues to      improve,      Ranelle Oyster, M.D.  Electronically Signed     ZTS/MedQ  D:  01/22/2005 15:36:46  T:  01/22/2005 17:02:01  Job #:  [id number]   cc:   Hewitt Shorts, M.D.  Fax: [phone]

## 2010-08-25 NOTE — Assessment & Plan Note (Signed)
INTERVAL HISTORY:  Tab is back regarding his right cerebellar hematoma.  He continues to progress at home.  He is using a rolling walker and doing  his household chores and tasks successively.  He occasional will furniture  walk.  He still has some problems with his balance.  He is taking his  scopolamine patch once every 4 days and does not notice any increase in his  vertigo symptoms.  He still complains of poor vision of objects up close  which has not improved since the stroke.  He has done some work on some  watches and still replaces batteries at the [employer] but still complains  of problems with his vision and left hand which inhibit his coordination and  ability to perform some of the fine motor jewelry tasks.  The patient has  been losing some weight and attributes that to better dietary monitoring.  He remains on his current blood pressure medications which include labetalol  200 mg twice daily, Avapro 300 mg daily, hydrochlorothiazide 25 mg daily,  Catapres-TTS-3 one every week.   REVIEW OF SYSTEMS:  Pertinent positives are listed above.  No other changes  noted today in the health and history section.   SOCIAL HISTORY:  The patient still lives with his brother but is self-  sufficient in the home.   PHYSICAL EXAMINATION:  Blood pressure is 113/77, pulse is 88, respiratory  rate 16, he is saturating 98% in room air.  The patient is pleasant in no  acute distress.  He is alert and oriented x3.  Affect is bright and  appropriate.  Gait is ataxic and he still has problems with placement of the  left foot, particularly when walking without the walker today.  He has  decreased fine motor coordination of left upper extremity as well.  Reflexes  are 2++ on the left.  Sensation was decreased somewhat to proprioception on  the left.  He had a left pronator drift.  Cognitively, he seemed to be more  appropriate and have better insight and awareness.  He was less impulsive  than  I have seen him in the past.  His vision seems to be intact to all  fields.  He did complain of decreased vision of objects up close today.  Heart was regular rate and rhythm.  Lungs were clear.  Abdomen soft,  nontender.   ASSESSMENT:  1.  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  2.  Improving vestibular symptoms secondary to #1.  3.  History of syncope and hypovolemia.  4.  Hypertension.   PLAN:  1.  Decrease labetalol to 100 mg twice daily.  We would like to discontinue      this over the summer.  Continue other medications as above.  2.  We will taper off scopolamine patch over the next 2-3 weeks' time.      Hopefully we can get to a point where he is only using this as needed.  3.  Encouraged walker use, particularly outside the home.  He needs to      continue working on activities to improve balance and coordination of      left arm and leg.  4.  Recommend primary care followup and initiation.  5.  I will see the patient back in about 6 months' time.      Ranelle Oyster, M.D.  Electronically Signed     ZTS/MedQ  D:  04/17/2005 13:58:39  T:  04/17/2005 04:54:09  Job #:  [id number]

## 2010-08-25 NOTE — Assessment & Plan Note (Signed)
George Villa is back regarding his right cerebellar hematoma.  He has progressed  to walking with a cane.  He still complains of loss of balance, particularly  to the right side.  He denies any depression.  He is doing some odd and end  work around the house.  He is still trying to focus on home rehab at this  point.  He has not looked at looking for a new job.  He reports good sleep.  He is changing his scopolamine patch once a week now.  Usually after about 7  days, he notes that he begins to have some vestibular symptoms.  He is on a  Catapres patch TTS once every week.  We reduced his labetalol to 100 mg  b.i.d., which he essentially takes in the morning (a full 200-mg tablet).  He is also on Avapro 300 mg daily.   REVIEW OF SYSTEMS:  The patient does report some tingling on the right side.  Other pertinent positives listed above.  A full review is in the health and  history section.   SOCIAL HISTORY:  Please see health and history section.   PHYSICAL EXAMINATION:  VITAL SIGNS:  Blood pressure 137/64, pulse 91,  respiratory rate 16.  He is satting 99% on room air.  GENERAL:  The patient is pleasant, no acute distress.  He is a bit more  animated than previously.  He walks with a straight cane today and tends to  lean to the right somewhat, putting the cane far out in front of him.  He  seems to have excessive elbow flexion.  He continues to have significant  ataxia to the right side.  I see no frank vertigo or nystagmus on exam  today.  There is a right pronator drift.  Sensation may be a bit decreased  on the right side as well today.  Cognitively, he is a bit more appropriate  and seems to have improved awareness.  HEART:  Regular rate and rhythm.  LUNGS:  Clear.  ABDOMEN:  Soft, nontender.   ASSESSMENT:  1.  Right cerebellar hemorrhage, status post occipital craniotomy  2.  History of syncope and hypovolemia.  3.  Hypertension.  4.  Vestibular symptoms secondary to #1.    PLAN:  1.  We will decrease labetalol to 100 mg daily for 2 weeks, then off, blood      pressure tolerating.  2.  Continue Avapro 300 mg daily.  3.  Continue trying to taper scopolamine patch off.  4.  Encouraged water therapy and exercises and perhaps a stationary bike.  5.  When I see him back in 6 months' time, we will make vocational rehab      referral.      Ranelle Oyster, M.D.  Electronically Signed     ZTS/MedQ  D:  10/17/2005 12:59:21  T:  10/17/2005 16:15:52  Job #:  [id number]

## 2010-08-25 NOTE — H&P (Signed)
NAME:  George Villa, George Villa NO.:  [id number]   MEDICAL RECORD NO.:  [id number]          PATIENT TYPE:  INP   LOCATION:  1830                         FACILITY:  [facility]   PHYSICIAN:  Thomos Lemons, D.O. [facility]   DATE OF BIRTH:  05-26-1965   DATE OF ADMISSION:  09/01/2004  DATE OF DISCHARGE:                                HISTORY & PHYSICAL   PRIMARY CARE PHYSICIAN:  Valetta Mole. Swords, MD, [facility].   CHIEF COMPLAINT:  Syncope.   HISTORY OF PRESENT ILLNESS:  Patient is a 45 year old white male recently  discharged from [facility] on June 15, 2004, after prolonged  hospitalization for a right cerebellar hemorrhage, status post suboccipital  craniectomy.  Patient required prolonged ventilation support and was  discharged on June 15, 2004, after undergoing rehab.  Prior to today's ER  presentation, patient had an episode of syncope one week ago, brief, that  was associated with orthostasis.  Patient had repeat episode today and was  told by his primary care physician to seek care at the emergency room.  The  patient notes that when he was getting up from the sofa to go towards the  bathroom, patient fell unconscious.  There was an elderly male, who was in  the room with him who witnessed the event and told the patient that he was  shaking his hands while he was unconscious.  Episode reportedly to have  lasted approximately two to five minutes.  Patient denies any confusion or  somnolence after episode, no evidence of tongue biting, or loss of bowel or  bladder control.   PAST MEDICAL HISTORY:  1.  Cerebellar hemorrhage, status post suboccipital craniectomy.  2.  Hypertension.  3.  History of alcohol use.  4.  No history of diabetes, heart disease, or lung disease.   SOCIAL HISTORY:  Patient lives with his brother.  There is a history of  alcohol use prior to his hemorrhagic stroke.  Patient says that he consumed  approximately three or four beers per evening.  No history  of tobacco use.  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   FAMILY HISTORY:  Significant for father deceased at approximately age 33 due  to some sort of cancer,  mother is still living, and he has three siblings  that are in good health.   REVIEW OF SYSTEMS:  No recent fevers, chills, or preceding illness.  No  headache, chest pain, shortness of breath, nausea, vomiting, constipation,  diarrhea.  All other systems negative.   LABORATORY DATA:  Starting with a complete metabolic profile:  Sodium 142,  potassium 3.3, chloride 106, CO2 28, BUN 24, creatinine is elevated at 1.8,  baseline is 1.1, and blood sugar is 84.  CBC showed WBC 9.7, H&H of 13.5 and  39.6, and platelets of 333.  LFTs were within normal limits.  First set of  cardiac enzymes were less than 0.01.  EKG was performed in the ER and showed  a normal sinus rhythm at 65 beats per minute, nonspecific ST changes, no  acute changes seen.  A chest  x-ray was also reported normal by ER staff.   CURRENT MEDICATIONS:  1.  Maxzide 25/37.5 one tablet once daily.  2.  Avapro 150 mg once daily.  3.  Catapres transdermal 0.3 mg daily.  4.  Baclofen 10 mg t.i.d.  5.  Protonix 40 mg once a day.  6.  Ditropan 2.5 mg t.i.d.  7.  Labetalol 200 mg b.i.d.  8.  Scopolamine patch 1.5 mg.   PHYSICAL EXAMINATION:  VITAL SIGNS:  Blood pressure is 80/52, pulse is 94,  respirations 20, and O2 SAT is 98% on two liters.  GENERAL:  The patient is a well-developed, well-nourished, 45 year old white  male, awake, alert, and oriented x3.  Patient has some mild, garbled speech  that is at times difficult to understand.  HEENT:  Pupils are equal, round, and reactive to light bilaterally.  Extraocular motility was intact.  Patient was normocephalic, atraumatic.  Patient had no scleral icterus.  Conjunctivae were within normal limits.  Tympanic membranes were clear bilaterally.  His mucous membranes were dry.  NECK:  Exam did not reveal any  adenopathy, thyromegaly, or carotid bruit.  LUNGS:  Chest was clear to auscultation bilaterally.  No rhonchi, rales, or  wheezing.  Patient had normal respiratory effort.  CARDIOVASCULAR:  Regular rate and rhythm.  No significant murmurs, rubs, or  gallops appreciated.  Heart sounds were somewhat distant.  ABDOMEN:  Soft, nontender, positive bowel sounds, no organomegaly.  EXTREMITIES:  No clubbing, cyanosis, or edema.  NEUROLOGIC:  Cranial nerves II-XII were grossly intact.  The patient had a  mild intention tremor.  Able to move all four extremities.  Reflexes were +1  in the upper and extremities and +1 to 2 Achilles and patellar, and there  was no Babinski.   ASSESSMENT AND PLAN:  1.  Syncopal episode with hypotension in a 45 year old male with a recent      history of cerebellar hemorrhage.  2.  History of alcohol use.  3.  Dehydration/azotemia.   RECOMMENDATIONS:  Patient's syncopal episodes secondary to hypovolemia.  We  will hold his Avapro and decrease his Baclofen to 5 mg t.i.d. and decrease  his Labetalol to 100 mg p.o. b.i.d.  With his recent history of hemorrhagic  cerebellar stroke, we will need to rule out seizure activity and an EEG will  be ordered.  We will hydrate the patient with normal saline, observe the  patient with neuro checks q.4h x24 hours.  If patient is much improved  tomorrow and an EEG is negative, he can potentially be discharged with  followup with Dr. Cato Mulligan.      RY/MEDQ  D:  09/01/2004  T:  09/01/2004  Job:  [id number]   cc:   Valetta Mole. Swords, M.D. [facility]

## 2010-08-25 NOTE — Op Note (Signed)
NAME:  EZEL, VALLONE NO.:  [id number]   MEDICAL RECORD NO.:  [id number]          PATIENT TYPE:  INP   LOCATION:  1826                         FACILITY:  [facility]   PHYSICIAN:  Hewitt Shorts, M.D.DATE OF BIRTH:  05/15/2006   DATE OF PROCEDURE:  05/16/2004  DATE OF DISCHARGE:                                 OPERATIVE REPORT   PREOPERATIVE DIAGNOSES:  1.  Cerebellar hematoma.  2.  Obstructive hydrocephalus.   POSTOPERATIVE DIAGNOSES:  1.  Cerebellar hematoma.  2.  Obstructive hydrocephalus.   OPERATION PERFORMED:  1.  Placement of right frontal intraventricular catheter.  2.  Suboccipital craniectomy with evaluation of cerebellar hematoma with      microdissection.   SURGEON:  Hewitt Shorts, M.D.   ANESTHESIA:  General endotracheal.   INDICATIONS:  The patient is a gentleman in his late 65s with a history  hypertension who presented with vomiting and diminished level of response  who was found on CT scan to have a large right cerebellar hematoma with  early hydrocephalus.  The decision was made to proceed with emergent  placement of intraventricular catheter and suboccipital craniotomy, and  evacuation of the hematoma.   DESCRIPTION OF OPERATION:  The patient was intubated already in the  emergency room and was brought to the neurosurgical operating suite, placed  under general endotracheal anesthesia and prepared for surgery by the  anesthesia service.  The right frontal region was shaved, then prepped with  Betadine solution and draped in a sterile fashion.  Then a 5 mm incision was  made in the right mid pupillary line adjacent to coronal suture.  A twist  drill hole was made, the dura was punctured and a ventricular catheter was  passed into the right frontal horn.  CSF drained under pressure was then  capped off and connected to a trocar.  The catheter was tunneled  subcutaneously and brought out through a separate stab incision.  The  catheter was secured to the scalp at four separate points.   The initial incision was closed with 3-0 nylon suture and the catheter was  secured to the scalp with 3-0 nylon suture.  This system was then  subsequently connected to a closed collection system and was dressed with  two OpSites.   We then applied the three-pin Mayfield head holder.  The patient was turned  to a prone position with the neck gently flexed.  The suboccipital region  was shaved and then prepped with Betadine soap and solution, and draped in a  sterile fashion.  The midline was infiltrated with local anesthetic with  epinephrine.  Then a midline incision was made and carried down to the  subcutaneous tissue.  Bipolar cautery and electrocautery were used to  maintained hemostasis.  Dissection was carried down to the occipital bone  and we exposed the occipital bone to the foramen magnum, and exposed the  posterior ring of C1.  Self-retaining retractors were placed and then we  performed a craniectomy using the X-max drill, Kerrison punches and double-  action rongeurs.   The dura was then  opened in a V-shaped fashion, hinged superiorly and then a  further side cut was made to the right side to better expose the right  cerebellar hemisphere.  The hematoma had broken through the cerebellar  cortex.  This opening was extended.  Bipolar cautery was used to maintain  hemostasis.  We were able to gradually mobilize the hematoma by gently  irrigating it off the cerebellar surface. Portions of the hematoma were  saved and sent to pathology for permanent specimen.   The microscope was draped and brought into the field to provide additional  magnification, illumination and visualization.  The remainder of the  hematoma removal and the establishment of hemostasis were performed using  microdissection and microsurgical technique.  We gently irrigated the  remainder of the hematoma off the cerebellar surface.  Bipolar cautery  was  used as necessary to establish hemostasis.  Once the hematoma had been  removed and hemostasis established we lined the walls of the hematoma cavity  with Surgicel.  Again, good hemostasis was noted.  After this was observed  for approximately 10 minutes we proceeded with closure.   We used a 4 x 4 cm piece of DuraGuard as a dural patch and this was sutured  to the edges of the dural opening with interrupted 4-0 Nurolon sutures.  We  then approximated the nuchal muscles with interrupted undyed 1-Vicryl  sutures, the deep fascia was closed with interrupted undyed 1-Vicryl suture  and the subcutaneous and the subcuticular were closed with interrupted  inverted 2-0 undyed Vicryl sutures.  The skin edges were closed with  surgical staples. The wound was dressed with Adaptic and sterile gauze, and  Hydrofix.   Following surgery the patient was turned back to the supine position.  Th  three-pin Mayfield head holder was removed. The patient was not reversed  from anesthetic, but was then transferred to the neurosurgical intensive  care unit for further care.   ESTIMATED BLOOD LOSS:  The estimated blood loss was 100 mL.   COUNTS:  Sponge and needle counts were correct.      RWN/MEDQ  D:  05/16/2004  T:  05/16/2004  Job:  [id number]

## 2010-09-28 ENCOUNTER — Emergency Department (HOSPITAL_COMMUNITY)
Admission: EM | Admit: 2010-09-28 | Discharge: 2010-09-28 | Disposition: A | Discharge status: Home / Self Care | Payer: Medicaid Other | Attending: Emergency Medicine | Admitting: Emergency Medicine

## 2010-09-28 DIAGNOSIS — Z79899 Other long term (current) drug therapy: Secondary | ICD-10-CM | POA: Insufficient documentation

## 2010-09-28 DIAGNOSIS — Z8673 Personal history of transient ischemic attack (TIA), and cerebral infarction without residual deficits: Secondary | ICD-10-CM | POA: Insufficient documentation

## 2010-09-28 DIAGNOSIS — M79609 Pain in unspecified limb: Secondary | ICD-10-CM | POA: Insufficient documentation

## 2010-09-28 DIAGNOSIS — I10 Essential (primary) hypertension: Secondary | ICD-10-CM | POA: Insufficient documentation

## 2010-12-03 ENCOUNTER — Emergency Department (HOSPITAL_COMMUNITY): Payer: Medicaid Other

## 2010-12-03 ENCOUNTER — Emergency Department (HOSPITAL_COMMUNITY)
Admission: EM | Admit: 2010-12-03 | Discharge: 2010-12-03 | Disposition: A | Discharge status: Home / Self Care | Payer: Medicaid Other | Attending: Emergency Medicine | Admitting: Emergency Medicine

## 2010-12-03 DIAGNOSIS — IMO0002 Reserved for concepts with insufficient information to code with codable children: Secondary | ICD-10-CM | POA: Insufficient documentation

## 2010-12-03 DIAGNOSIS — I10 Essential (primary) hypertension: Secondary | ICD-10-CM | POA: Insufficient documentation

## 2010-12-03 DIAGNOSIS — Z8673 Personal history of transient ischemic attack (TIA), and cerebral infarction without residual deficits: Secondary | ICD-10-CM | POA: Insufficient documentation

## 2010-12-03 DIAGNOSIS — M79609 Pain in unspecified limb: Secondary | ICD-10-CM | POA: Insufficient documentation

## 2011-01-12 LAB — POCT I-STAT, CHEM 8
BUN: 19 mg/dL (ref 6–23)
Chloride: 106 mEq/L (ref 96–112)
Creatinine, Ser: 1.6 mg/dL — ABNORMAL HIGH (ref 0.4–1.5)
Potassium: 3.5 mEq/L (ref 3.5–5.1)
Sodium: 143 mEq/L (ref 135–145)
TCO2: 27 mmol/L (ref 0–100)

## 2011-04-10 HISTORY — PX: LUMBAR DISC SURGERY: SHX700

## 2013-05-20 ENCOUNTER — Emergency Department (HOSPITAL_COMMUNITY)
Admission: EM | Admit: 2013-05-20 | Discharge: 2013-05-20 | Disposition: A | Discharge status: Home / Self Care | Payer: Medicaid Other | Attending: Emergency Medicine | Admitting: Emergency Medicine

## 2013-05-20 ENCOUNTER — Encounter (HOSPITAL_COMMUNITY): Payer: Self-pay | Admitting: Emergency Medicine

## 2013-05-20 ENCOUNTER — Emergency Department (HOSPITAL_COMMUNITY): Payer: Medicaid Other

## 2013-05-20 DIAGNOSIS — Z79899 Other long term (current) drug therapy: Secondary | ICD-10-CM | POA: Insufficient documentation

## 2013-05-20 DIAGNOSIS — W010XXA Fall on same level from slipping, tripping and stumbling without subsequent striking against object, initial encounter: Secondary | ICD-10-CM | POA: Insufficient documentation

## 2013-05-20 DIAGNOSIS — Y9301 Activity, walking, marching and hiking: Secondary | ICD-10-CM | POA: Insufficient documentation

## 2013-05-20 DIAGNOSIS — S42291A Other displaced fracture of upper end of right humerus, initial encounter for closed fracture: Secondary | ICD-10-CM

## 2013-05-20 DIAGNOSIS — Z8673 Personal history of transient ischemic attack (TIA), and cerebral infarction without residual deficits: Secondary | ICD-10-CM | POA: Insufficient documentation

## 2013-05-20 DIAGNOSIS — Y929 Unspecified place or not applicable: Secondary | ICD-10-CM | POA: Insufficient documentation

## 2013-05-20 DIAGNOSIS — S42293A Other displaced fracture of upper end of unspecified humerus, initial encounter for closed fracture: Secondary | ICD-10-CM | POA: Insufficient documentation

## 2013-05-20 DIAGNOSIS — I10 Essential (primary) hypertension: Secondary | ICD-10-CM | POA: Insufficient documentation

## 2013-05-20 HISTORY — DX: Cerebral infarction, unspecified: I63.9

## 2013-05-20 HISTORY — DX: Essential (primary) hypertension: I10

## 2013-05-20 MED ORDER — OXYCODONE-ACETAMINOPHEN 7.5-325 MG PO TABS: 1.0000 | ORAL_TABLET | ORAL | Status: DC | PRN

## 2013-05-20 MED ORDER — MORPHINE SULFATE 4 MG/ML IJ SOLN
4.0000 mg | Freq: Once | INTRAMUSCULAR | Status: AC
  Administered 2013-05-20: 4 mg via INTRAMUSCULAR
  Filled 2013-05-20: qty 1

## 2013-05-20 NOTE — ED Notes (Signed)
Pt c/o  R shoulder pain after tripping and falling on shoulder last night while walking on sidewalk. Denies other injuries. Denies LOC. Can lift arm put painful

## 2013-05-20 NOTE — Progress Notes (Signed)
   CARE MANAGEMENT ED NOTE 05/20/2013  Patient:  George Villa,George Villa   Account Number:  [id number]  Date Initiated:  05/20/2013  Documentation initiated by:  Radford PaxFERRERO,Izik Bingman  Subjective/Objective Assessment:   Patient presents to Ed with right shoulder pain     Subjective/Objective Assessment Detail:     Action/Plan:   Action/Plan Detail:   Anticipated DC Date:  05/20/2013     Status Recommendation to Physician:   Result of Recommendation:    Other ED Services  Consult Working Plan    DC Planning Services  PCP issues    Choice offered to / List presented to:            Status of service:  Completed, signed off  ED Comments:   ED Comments Detail:  Patient confirms he does not have a pcp.  [facility] with [facility] of pcp's who [place].  Patient thankful for resources.  no further EDCM needs at this time.

## 2013-05-20 NOTE — ED Notes (Signed)
Ortho tech called for sling immobilizer.

## 2013-05-20 NOTE — Discharge Instructions (Signed)
It is important for you to wear the sling as directed. Followup with orthopedics as soon as possible. Apply ice intermittently throughout the day. Take percocet for severe pain only. No driving or operating heavy machinery while taking percocet. This medication may cause drowsiness.  Humerus Fracture, Treated with Immobilization The humerus is the large bone in your upper arm. You have a broken (fractured) humerus. These fractures are easily diagnosed with X-rays. TREATMENT  Simple fractures which will heal without disability are treated with simple immobilization. Immobilization means you will wear a cast, splint, or sling. You have a fracture which will do well with immobilization. The fracture will heal well simply by being held in a good position until it is stable enough to begin range of motion exercises. Do not take part in activities which would further injure your arm.  HOME CARE INSTRUCTIONS   Put ice on the injured area.  Put ice in a plastic bag.  Place a towel between your skin and the bag.  Leave the ice on for 15-20 minutes, 03-04 times a day.  If you have a cast:  Do not scratch the skin under the cast using sharp or pointed objects.  Check the skin around the cast every day. You may put lotion on any red or sore areas.  Keep your cast dry and clean.  If you have a splint:  Wear the splint as directed.  Keep your splint dry and clean.  You may loosen the elastic around the splint if your fingers become numb, tingle, or turn cold or blue.  If you have a sling:  Wear the sling as directed.  Do not put pressure on any part of your cast or splint until it is fully hardened.  Your cast or splint can be protected during bathing with a plastic bag. Do not lower the cast or splint into water.  Only take over-the-counter or prescription medicines for pain, discomfort, or fever as directed by your caregiver.  Do range of motion exercises as instructed by your  caregiver.  Follow up as directed by your caregiver. This is very important in order to avoid permanent injury or disability and chronic pain. SEEK IMMEDIATE MEDICAL CARE IF:   Your skin or nails in the injured arm turn blue or gray.  Your arm feels cold or numb.  You develop severe pain in the injured arm.  You are having problems with the medicines you were given. MAKE SURE YOU:   Understand these instructions.  Will watch your condition.  Will get help right away if you are not doing well or get worse. Document Released: 07/02/2000 Document Revised: 06/18/2011 Document Reviewed: 05/10/2010 [facility] Patient Information 2014 [employer] [place].

## 2013-05-20 NOTE — ED Provider Notes (Signed)
CSN: [id number]     Arrival date & time 05/20/13  1908 History  This chart was scribed for non-physician practitioner, George Mace, PA-C working with George Porter, MD by George Villa, ED scribe. This patient was seen in room WTR6/WTR6 and the patient's care was started at 8:30 PM.    Chief Complaint  Patient presents with  . Shoulder Injury    The history is provided by the patient. No language interpreter was used.   HPI Comments: George Villa is a 48 y.o. male who presents to the Emergency Department complaining of sudden onset right shoulder pain that started 1 day ago. Pt states that he was carry a heavy object when he slipped and fell on his right shoulder. Pain currently 10/10, worse with movement. He reports taking oxycodone with little to no relief. Pt is complaining of associated sleep disturbances last night due to pain. Denies numbness or tingling.    Past Medical History  Diagnosis Date  . Hypertension   . Stroke    Past Surgical History  Procedure Laterality Date  . Back surgery     No family history on file. History  Substance Use Topics  . Smoking status: Never Smoker   . Smokeless tobacco: Not on file  . Alcohol Use: Yes     Comment: occ    Review of Systems A complete 10 system review of systems was obtained and all systems are negative except as noted in the HPI and PMH.     Allergies  Review of patient's allergies indicates no known allergies.  Home Medications   Current Outpatient Rx  Name  Route  Sig  Dispense  Refill  . lisinopril (PRINIVIL,ZESTRIL) 20 MG tablet   Oral   Take 20 mg by mouth daily.         Marland Kitchen oxyCODONE-acetaminophen (PERCOCET/ROXICET) 5-325 MG per tablet   Oral   Take 1 tablet by mouth every 6 (six) hours as needed for severe pain.         Marland Kitchen oxyCODONE-acetaminophen (PERCOCET) 7.5-325 MG per tablet   Oral   Take 1 tablet by mouth every 4 (four) hours as needed for pain.   15 tablet   0     Triage Vitals: BP  146/98  Pulse 102  Temp(Src) 99.2 F (37.3 C) (Oral)  Resp 18  Ht 5\' 7"  (1.702 m)  Wt 215 lb (97.523 kg)  BMI 33.67 kg/m2  SpO2 95%  Physical Exam  Nursing note and vitals reviewed. Constitutional: He is oriented to person, place, and time. He appears well-developed and well-nourished. No distress.  HENT:  Head: Normocephalic and atraumatic.  Eyes: Conjunctivae and EOM are normal.  Neck: Normal range of motion. Neck supple.  Cardiovascular: Normal rate, regular rhythm and normal heart sounds.   Pulmonary/Chest: Effort normal and breath sounds normal.  Musculoskeletal: Normal range of motion. He exhibits no edema.  Tenderness to palpation of entire right shoulder and deltoid. Very mild ecchymosis over the right deltoid. No deformity. No swelling. Full flexion, pain noted. Abduction limited to 90 degrees. Crepitus noted. Intact distal pulses. Sensation intact. Elbow normal.    Neurological: He is alert and oriented to person, place, and time.  Skin: Skin is warm and dry.  Psychiatric: He has a normal mood and affect. His behavior is normal.    ED Course  Procedures (including critical care time)  DIAGNOSTIC STUDIES: Oxygen Saturation is 95% on RA, adequate by my interpretation.    COORDINATION OF CARE:  8:51 PM- Will prescribe some antiinflammatories. Pt advised of plan for treatment and pt agrees.  Labs Review Labs Reviewed - No data to display Imaging Review Dg Shoulder Right  05/20/2013   CLINICAL DATA:  Fall onto right shoulder with pain  EXAM: RIGHT SHOULDER - 2+ VIEW  COMPARISON:  None.  FINDINGS: There is a nondisplaced mildly comminuted fracture of the humeral head extending into the surgical neck of the humerus. Fracture extends from humeral head vertically into the surgical neck, with component of the fracture line extending into the greater tuberosity.  IMPRESSION: Mildly comminuted nondisplaced fracture of the humeral head and neck.   Electronically Signed   By:  George Heiraymond  Villa M.D.   On: 05/20/2013 20:57    EKG Interpretation   None       MDM   Final diagnoses:  Closed fracture of head of right humerus    Patient presenting with right shoulder pain. X-ray showing mildly comminuted nondisplaced fracture of humeral head fracture. Neurovascularly intact. Patient is in no apparent distress. Sling immobilizer given. Patient is stable for discharge home with orthopedic followup. Percocet for pain. Return precautions given. Patient states understanding of treatment care plan and is agreeable. Case discussed with attending Dr. Fayrene FearingJames who agrees with plan of care.   I personally performed the services described in this documentation, which was scribed in my presence. The recorded information has been reviewed and is accurate.    George MaceRobyn M Albert, PA-C 05/20/13 2118

## 2013-05-25 NOTE — ED Provider Notes (Signed)
Medical screening examination/treatment/procedure(s) were performed by non-physician practitioner and as supervising physician I was immediately available for consultation/collaboration.  EKG Interpretation   None         Shayanna Thatch, MD 05/25/13 2246 

## 2014-02-17 ENCOUNTER — Inpatient Hospital Stay (HOSPITAL_COMMUNITY)
Admission: EM | Admit: 2014-02-17 | Discharge: 2014-02-19 | DRG: 247 | Disposition: A | Discharge status: Home / Self Care | Payer: Medicaid Other | Attending: Interventional Cardiology | Admitting: Interventional Cardiology

## 2014-02-17 ENCOUNTER — Encounter (HOSPITAL_COMMUNITY): Payer: Self-pay | Admitting: Physician Assistant

## 2014-02-17 ENCOUNTER — Encounter (HOSPITAL_COMMUNITY)
Admission: EM | Disposition: A | Discharge status: Home / Self Care | Payer: Medicaid Other | Source: Home / Self Care | Attending: Interventional Cardiology

## 2014-02-17 DIAGNOSIS — Z7982 Long term (current) use of aspirin: Secondary | ICD-10-CM | POA: Diagnosis not present

## 2014-02-17 DIAGNOSIS — Z79899 Other long term (current) drug therapy: Secondary | ICD-10-CM

## 2014-02-17 DIAGNOSIS — R739 Hyperglycemia, unspecified: Secondary | ICD-10-CM | POA: Diagnosis present

## 2014-02-17 DIAGNOSIS — G8929 Other chronic pain: Secondary | ICD-10-CM | POA: Diagnosis present

## 2014-02-17 DIAGNOSIS — Z8679 Personal history of other diseases of the circulatory system: Secondary | ICD-10-CM

## 2014-02-17 DIAGNOSIS — I2582 Chronic total occlusion of coronary artery: Secondary | ICD-10-CM | POA: Diagnosis present

## 2014-02-17 DIAGNOSIS — I517 Cardiomegaly: Secondary | ICD-10-CM

## 2014-02-17 DIAGNOSIS — I2510 Atherosclerotic heart disease of native coronary artery without angina pectoris: Secondary | ICD-10-CM

## 2014-02-17 DIAGNOSIS — I2111 ST elevation (STEMI) myocardial infarction involving right coronary artery: Secondary | ICD-10-CM | POA: Diagnosis present

## 2014-02-17 DIAGNOSIS — M549 Dorsalgia, unspecified: Secondary | ICD-10-CM | POA: Diagnosis present

## 2014-02-17 DIAGNOSIS — I2119 ST elevation (STEMI) myocardial infarction involving other coronary artery of inferior wall: Secondary | ICD-10-CM | POA: Diagnosis present

## 2014-02-17 DIAGNOSIS — Z9861 Coronary angioplasty status: Secondary | ICD-10-CM

## 2014-02-17 DIAGNOSIS — R001 Bradycardia, unspecified: Secondary | ICD-10-CM | POA: Diagnosis not present

## 2014-02-17 DIAGNOSIS — I252 Old myocardial infarction: Secondary | ICD-10-CM | POA: Diagnosis present

## 2014-02-17 DIAGNOSIS — R0602 Shortness of breath: Secondary | ICD-10-CM | POA: Diagnosis present

## 2014-02-17 DIAGNOSIS — Z823 Family history of stroke: Secondary | ICD-10-CM

## 2014-02-17 DIAGNOSIS — E669 Obesity, unspecified: Secondary | ICD-10-CM | POA: Diagnosis present

## 2014-02-17 DIAGNOSIS — K219 Gastro-esophageal reflux disease without esophagitis: Secondary | ICD-10-CM | POA: Diagnosis present

## 2014-02-17 DIAGNOSIS — Z6831 Body mass index (BMI) 31.0-31.9, adult: Secondary | ICD-10-CM | POA: Diagnosis not present

## 2014-02-17 DIAGNOSIS — Z7902 Long term (current) use of antithrombotics/antiplatelets: Secondary | ICD-10-CM

## 2014-02-17 DIAGNOSIS — I9581 Postprocedural hypotension: Secondary | ICD-10-CM | POA: Diagnosis not present

## 2014-02-17 DIAGNOSIS — I10 Essential (primary) hypertension: Secondary | ICD-10-CM | POA: Diagnosis present

## 2014-02-17 HISTORY — DX: Nontraumatic intracerebral hemorrhage in cerebellum: I61.4

## 2014-02-17 HISTORY — DX: Low back pain: M54.5

## 2014-02-17 HISTORY — DX: Other chronic pain: G89.29

## 2014-02-17 HISTORY — DX: Gastro-esophageal reflux disease without esophagitis: K21.9

## 2014-02-17 HISTORY — DX: ST elevation (STEMI) myocardial infarction involving other coronary artery of inferior wall: I21.19

## 2014-02-17 HISTORY — DX: Headache, unspecified: R51.9

## 2014-02-17 HISTORY — DX: Headache: R51

## 2014-02-17 HISTORY — PX: LEFT HEART CATHETERIZATION WITH CORONARY ANGIOGRAM: SHX5451

## 2014-02-17 HISTORY — PX: PERCUTANEOUS CORONARY STENT INTERVENTION (PCI-S): SHX5485

## 2014-02-17 LAB — LIPID PANEL
CHOL/HDL RATIO: 4 ratio
Cholesterol: 177 mg/dL (ref 0–200)
HDL: 44 mg/dL (ref >39)
LDL CALC: 109 mg/dL — AB (ref 0–99)
TRIGLYCERIDES: 121 mg/dL (ref <150)
VLDL: 24 mg/dL (ref 0–40)

## 2014-02-17 LAB — CK TOTAL AND CKMB (NOT AT ARMC)
CK, MB: 3.7 ng/mL (ref 0.3–4.0)
RELATIVE INDEX: 2.2 (ref 0.0–2.5)
Total CK: 167 U/L (ref 7–232)

## 2014-02-17 LAB — CBC WITH DIFFERENTIAL/PLATELET
Basophils Absolute: 0 10*3/uL (ref 0.0–0.1)
Basophils Relative: 0 % (ref 0–1)
Eosinophils Absolute: 0.1 10*3/uL (ref 0.0–0.7)
Eosinophils Relative: 1 % (ref 0–5)
HEMATOCRIT: 41.8 % (ref 39.0–52.0)
Hemoglobin: 13.2 g/dL (ref 13.0–17.0)
LYMPHS ABS: 0.8 10*3/uL (ref 0.7–4.0)
Lymphocytes Relative: 6 % — ABNORMAL LOW (ref 12–46)
MCH: 23.1 pg — AB (ref 26.0–34.0)
MCHC: 31.6 g/dL (ref 30.0–36.0)
MCV: 73.1 fL — ABNORMAL LOW (ref 78.0–100.0)
MONO ABS: 0.7 10*3/uL (ref 0.1–1.0)
Monocytes Relative: 6 % (ref 3–12)
NEUTROS ABS: 11.4 10*3/uL — AB (ref 1.7–7.7)
NEUTROS PCT: 87 % — AB (ref 43–77)
Platelets: 290 10*3/uL (ref 150–400)
RBC: 5.72 MIL/uL (ref 4.22–5.81)
RDW: 15.3 % (ref 11.5–15.5)
WBC: 13 10*3/uL — ABNORMAL HIGH (ref 4.0–10.5)

## 2014-02-17 LAB — COMPREHENSIVE METABOLIC PANEL
ALBUMIN: 3.6 g/dL (ref 3.5–5.2)
ALK PHOS: 89 U/L (ref 39–117)
ALT: 27 U/L (ref 0–53)
AST: 23 U/L (ref 0–37)
Anion gap: 15 (ref 5–15)
BILIRUBIN TOTAL: 0.3 mg/dL (ref 0.3–1.2)
BUN: 15 mg/dL (ref 6–23)
CHLORIDE: 105 meq/L (ref 96–112)
CO2: 20 meq/L (ref 19–32)
Calcium: 8.9 mg/dL (ref 8.4–10.5)
Creatinine, Ser: 1.18 mg/dL (ref 0.50–1.35)
GFR calc Af Amer: 82 mL/min — ABNORMAL LOW (ref >90)
GFR, EST NON AFRICAN AMERICAN: 71 mL/min — AB (ref >90)
Glucose, Bld: 134 mg/dL — ABNORMAL HIGH (ref 70–99)
POTASSIUM: 3.9 meq/L (ref 3.7–5.3)
Sodium: 140 mEq/L (ref 137–147)
Total Protein: 7.5 g/dL (ref 6.0–8.3)

## 2014-02-17 LAB — POCT I-STAT, CHEM 8
BUN: 14 mg/dL (ref 6–23)
CALCIUM ION: 1.2 mmol/L (ref 1.12–1.23)
CREATININE: 1.2 mg/dL (ref 0.50–1.35)
Chloride: 111 mEq/L (ref 96–112)
Glucose, Bld: 134 mg/dL — ABNORMAL HIGH (ref 70–99)
HCT: 42 % (ref 39.0–52.0)
HEMOGLOBIN: 14.3 g/dL (ref 13.0–17.0)
Potassium: 3.8 mEq/L (ref 3.7–5.3)
Sodium: 139 mEq/L (ref 137–147)
TCO2: 20 mmol/L (ref 0–100)

## 2014-02-17 LAB — POCT ACTIVATED CLOTTING TIME: Activated Clotting Time: 445 seconds

## 2014-02-17 LAB — PROTIME-INR
INR: 1.06 (ref 0.00–1.49)
PROTHROMBIN TIME: 14 s (ref 11.6–15.2)

## 2014-02-17 LAB — MRSA PCR SCREENING: MRSA BY PCR: NEGATIVE

## 2014-02-17 LAB — TROPONIN I: Troponin I: <0.3 ng/mL (ref <0.30)

## 2014-02-17 LAB — APTT: aPTT: 32 seconds (ref 24–37)

## 2014-02-17 SURGERY — LEFT HEART CATHETERIZATION WITH CORONARY ANGIOGRAM
Anesthesia: LOCAL

## 2014-02-17 MED ORDER — SODIUM CHLORIDE 0.9 % IV SOLN
INTRAVENOUS | Status: DC
  Administered 2014-02-17 (×2): via INTRAVENOUS

## 2014-02-17 MED ORDER — NITROGLYCERIN IN D5W 200-5 MCG/ML-% IV SOLN
10.0000 ug/min | INTRAVENOUS | Status: DC
  Administered 2014-02-17: 10 ug/min via INTRAVENOUS
  Filled 2014-02-17: qty 250

## 2014-02-17 MED ORDER — BIVALIRUDIN 250 MG IV SOLR
INTRAVENOUS | Status: AC
  Filled 2014-02-17: qty 250

## 2014-02-17 MED ORDER — METOPROLOL TARTRATE 25 MG/10 ML ORAL SUSPENSION
25.0000 mg | Freq: Two times a day (BID) | ORAL | Status: DC
  Administered 2014-02-17: 25 mg via ORAL
  Filled 2014-02-17 (×2): qty 10

## 2014-02-17 MED ORDER — AMLODIPINE BESYLATE 5 MG PO TABS
5.0000 mg | ORAL_TABLET | Freq: Every day | ORAL | Status: DC
  Administered 2014-02-17 – 2014-02-19 (×3): 5 mg via ORAL
  Filled 2014-02-17 (×3): qty 1

## 2014-02-17 MED ORDER — CLOPIDOGREL BISULFATE 75 MG PO TABS
75.0000 mg | ORAL_TABLET | Freq: Every day | ORAL | Status: DC
  Administered 2014-02-18 – 2014-02-19 (×2): 75 mg via ORAL
  Filled 2014-02-17 (×3): qty 1

## 2014-02-17 MED ORDER — INFLUENZA VAC SPLIT QUAD 0.5 ML IM SUSY
0.5000 mL | PREFILLED_SYRINGE | INTRAMUSCULAR | Status: AC
  Administered 2014-02-18: 0.5 mL via INTRAMUSCULAR
  Filled 2014-02-17: qty 0.5

## 2014-02-17 MED ORDER — OXYCODONE-ACETAMINOPHEN 5-325 MG PO TABS
1.0000 | ORAL_TABLET | ORAL | Status: DC | PRN
  Administered 2014-02-17 – 2014-02-18 (×3): 2 via ORAL
  Filled 2014-02-17 (×3): qty 2

## 2014-02-17 MED ORDER — MIDAZOLAM HCL 2 MG/2ML IJ SOLN
INTRAMUSCULAR | Status: AC
  Filled 2014-02-17: qty 2

## 2014-02-17 MED ORDER — ACETAMINOPHEN 325 MG PO TABS
650.0000 mg | ORAL_TABLET | ORAL | Status: DC | PRN
  Administered 2014-02-17: 650 mg via ORAL
  Filled 2014-02-17: qty 2

## 2014-02-17 MED ORDER — CLOPIDOGREL BISULFATE 300 MG PO TABS
ORAL_TABLET | ORAL | Status: AC
  Filled 2014-02-17: qty 1

## 2014-02-17 MED ORDER — ATORVASTATIN CALCIUM 80 MG PO TABS
80.0000 mg | ORAL_TABLET | Freq: Every day | ORAL | Status: DC
  Administered 2014-02-17 – 2014-02-18 (×2): 80 mg via ORAL
  Filled 2014-02-17 (×3): qty 1

## 2014-02-17 MED ORDER — METOPROLOL TARTRATE 25 MG PO TABS
25.0000 mg | ORAL_TABLET | Freq: Two times a day (BID) | ORAL | Status: DC
  Administered 2014-02-17 – 2014-02-18 (×2): 25 mg via ORAL
  Filled 2014-02-17 (×3): qty 1

## 2014-02-17 MED ORDER — NITROGLYCERIN 1 MG/10 ML FOR IR/CATH LAB
INTRA_ARTERIAL | Status: AC
  Filled 2014-02-17: qty 10

## 2014-02-17 MED ORDER — LISINOPRIL 20 MG PO TABS
20.0000 mg | ORAL_TABLET | Freq: Every day | ORAL | Status: DC
  Administered 2014-02-17 – 2014-02-19 (×3): 20 mg via ORAL
  Filled 2014-02-17 (×3): qty 1

## 2014-02-17 MED ORDER — LIDOCAINE HCL (PF) 1 % IJ SOLN
INTRAMUSCULAR | Status: AC
  Filled 2014-02-17: qty 30

## 2014-02-17 MED ORDER — ATROPINE SULFATE 0.1 MG/ML IJ SOLN
INTRAMUSCULAR | Status: AC
  Filled 2014-02-17: qty 10

## 2014-02-17 MED ORDER — HEPARIN (PORCINE) IN NACL 2-0.9 UNIT/ML-% IJ SOLN
INTRAMUSCULAR | Status: AC
  Filled 2014-02-17: qty 1000

## 2014-02-17 MED ORDER — SODIUM CHLORIDE 0.9 % IV SOLN
0.2500 mg/kg/h | INTRAVENOUS | Status: AC
  Administered 2014-02-17: 0.25 mg/kg/h via INTRAVENOUS
  Filled 2014-02-17: qty 250

## 2014-02-17 MED ORDER — FENTANYL CITRATE 0.05 MG/ML IJ SOLN
INTRAMUSCULAR | Status: AC
  Filled 2014-02-17: qty 2

## 2014-02-17 MED ORDER — ONDANSETRON HCL 4 MG/2ML IJ SOLN: 4.0000 mg | Freq: Four times a day (QID) | INTRAMUSCULAR | Status: DC | PRN

## 2014-02-17 MED ORDER — ASPIRIN EC 325 MG PO TBEC
325.0000 mg | DELAYED_RELEASE_TABLET | Freq: Every day | ORAL | Status: DC
  Administered 2014-02-18 – 2014-02-19 (×2): 325 mg via ORAL
  Filled 2014-02-17 (×2): qty 1

## 2014-02-17 NOTE — Progress Notes (Signed)
Utilization Review Completed.Leroy Trim T11/02/2014  

## 2014-02-17 NOTE — H&P (Signed)
History and Physical  Patient ID: George Villa MRN: [id number], DOB: 02/09/1966 Date of Encounter: 02/17/2014, 12:40 PM Primary Physician: No primary care provider on file. Primary Cardiologist: New to Dr. 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 He has no prior cardiac hx or known family history of CAD (only stroke). 2D Echo 2006 in setting of his stroke showed EF 55-60%. 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 The symptoms persisted so EMS was called. EKG demonstrated inferior STEMI and he was markedly hypertensive at 200/100. He was subsequently brought emergently to the cath lab. He denies actual pain but feels a feeling like something is wrapped around his chest. He endorses nausea. He denies any known bleeding issues (aside from remote cerebellar event), vomiting, near syncope, syncope, LEE, or prior history of similar episodes.  Past Medical History  Diagnosis Date  . Hypertension   . 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  . Chronic back pain      Most Recent Cardiac Studies: 2D Echo 05/2004 Overall left ventricular systolic function was normal. Left ventricular ejection fraction was estimated , range being 55% to 65 %.. This study was inadequate for the evaluation of left ventricular regional wall motion. Appears to be a technically limited study since valves were not well visualized.    Surgical History:  Past Surgical History  Procedure Laterality Date  . Back surgery    . Brain hematoma evacuation    . Ventriculostomy       Home Meds: Prior to Admission medications   Medication Sig Start Date End Date  Taking? Authorizing Provider  lisinopril (PRINIVIL,ZESTRIL) 20 MG tablet Take 20 mg by mouth daily.    Historical Provider, MD  oxyCODONE-acetaminophen (PERCOCET) 7.5-325 MG per tablet Take 1 tablet by mouth every 4 (four) hours as needed for pain. 05/20/13   George Ching, PA-C  oxyCODONE-acetaminophen (PERCOCET/ROXICET) 5-325 MG per tablet Take 1 tablet by mouth every 6 (six) hours as needed for severe pain.    Historical Provider, MD  ? Acid reflux medicine  Allergies: No Known Allergies  History   Social History  . Marital Status: Single    Spouse Name: N/A    Number of Children: N/A  . Years of Education: N/A   Occupational History  .      Works in [place]  . Smoking status: Never Smoker   . Smokeless tobacco: Not on file  . Alcohol Use: 0.0 oz/week    0 Not specified per week     Comment: occ - 2-3x/week  . Drug Use: No  . Sexual Activity: Not on file   Other Topics Concern  . Not on file   Social History Narrative     Family History  Problem Relation Age of Onset  . Stroke      Review of Systems: General: negative for chills, fever Cardiovascular: negative for edema, orthopnea, palpitations Dermatological: negative for rash Respiratory: negative for cough or wheezing Urologic: negative for hematuria Abdominal: negative for bright red blood per rectum, melena, or hematemesis Neurologic: negative for syncope All other systems reviewed and  are otherwise negative except as noted above.  Labs: K 3.8, BUN 14/Cr 1.2, Hgb 14.3, Hct 42, glucose 131  Radiology/Studies:  No results found.   EKG: NSR 61bpm, inferior ST Elevation up to max 30m in lead III with reciprocal TWI in I, avL (max 3.514min avL), and mild ST-T abnormality in lead V2  Physical Exam: pulse 98, BP 186/140, RR 19, Pox 100% 2L General: Well developed, well nourished WM, in no acute distress. Shivering slightly. Head: Normocephalic, atraumatic, sclera non-icteric, no  xanthomas, nares are without discharge.  Neck: JVD not elevated. Lungs: Clear bilaterally to auscultation without wheezes, rales, or rhonchi. Breathing is unlabored. Heart: RRR with S1 S2. No murmurs, rubs, or gallops appreciated. Abdomen: Soft, non-tender, non-distended with normoactive bowel sounds. No hepatomegaly. No rebound/guarding. No obvious abdominal masses. Msk:  Strength and tone appear normal for age. Extremities: No clubbing or cyanosis. No edema.  Distal pedal pulses are 2+ and equal bilaterally. Neuro: Alert and oriented X 3. No focal deficit. No facial asymmetry. Moves all extremities spontaneously. Psych:  Responds to questions appropriately with a normal affect.   ASSESSMENT AND PLAN:   1. Acute inferior ST elevation MI / CAD 2. Accelerated hypertension 3. H/o cerebellar hemorrhage in 2006 s/p surgery 4. GERD 5. Hyperglycemia 6. Obesity Body mass index is 31.76 kg/(m^2).  The patient is undergoing emergent cardiac catheterization. Medical therapy to be determined based on outcome of cardiac catheterization. He is not a candidate for Brilinta or Effient due to his history of cerebellar hemorrhage. Will plan for baseline CXR, fasting lipids and A1C to further risk stratify.  Signed, DaMelina CopaA-C 02/17/2014, 12:40 PM  The patient presented with a complaint of dyspnea this started 9 AM. An EKG was then coated by EMS that demonstrated inferior ST elevation with reciprocal lateral changes compatible with STEMI. We met the patient in the emergency room where his complaint was dyspnea that he described as a sensation that something was wrapped around his chest that prevented him from taking a deep breath. EKG was obviously abnormal. We examined him and advanced for bringing him straight to the heart cath lab. No obvious abnormalities were found on examination. The history was consistent for prior history of intracranial hemorrhage 6 years prior. He has left lower extremity  weakness. He does not smoke. He denies drinking. He has severe hypertension and states that he is compliant with his medication. On the cath table to cardiac exam did not demonstrate murmur, gallop, rales, or rub. Carotid, radial, femoral, and posterior tibial pulses were 2-3+ bilateral. Neurological exam demonstrated a weak left lower extremity.  Patient's blood pressure was extremely elevated. Heparin was not administered. Arterial access was obtained immediately upon arriving in the cath lab.  The above dictated note is accurate.   Critical care time 35 minutes.

## 2014-02-17 NOTE — Progress Notes (Signed)
  Echocardiogram 2D Echocardiogram has been performed.  George Villa, George Villa 02/17/2014, 3:56 PM

## 2014-02-17 NOTE — CV Procedure (Signed)
Left Heart Catheterization with Coronary Angiography and DES RCA Report  George Villa  48 y.o.  male Oct 24, 1965  Procedure Date: 02/17/2014 Referring Physician: EMS Primary Cardiologist: Helyn Numbers, M.D.  INDICATIONS: Acute inferior ST elevation MI  PROCEDURE: 1. Left heart catheterization, emergency; 2. Coronary angiography; 3. Left ventriculography; 4. DES mid to distal RCA  CONSENT:  The risks, benefits, and details of the procedure were explained in detail to the patient. Risks including death, stroke, heart attack, kidney injury, allergy, limb ischemia, bleeding and radiation injury were discussed.  The patient verbalized understanding and wanted to proceed.  Informed written consent was obtained.  PROCEDURE TECHNIQUE:  After Xylocaine anesthesia a 5 French Slender sheath was placed in the right radial artery with an angiocath and the modified Seldinger technique.  Coronary angiography was done using a 5 F JR 4 guide catheter catheter.  The left subclavian enter the aorta beyond the midline. Left coronary was performed after intervention on the right coronary artery 5 French EBU 3.5 cm guide. Left ventriculography was done using the JR 4 diagnostic catheter and hand injection.   After the initial right coronary shots but was obvious that the EKG correlated with distal RCA occlusion. Angiomax bolus and infusion was started. 600 mg of Plavix was administered because of the patient's history 6 years ago of intracranial hemorrhage. April water guidewire was used to cross the stenosis in the distal right coronary. This led to reperfusion with TIMI grade 1-2 flow. It became obvious that the distal right coronary was severely and diffusely diseased. We used a 2.0 x 15 mm balloon for initial angioplasty. Initial angioplasty led to dissection but with patency and reestablishment of distal flow. Reperfusion was associated with transient hypotension and bradycardia. Atropine 0.6 mg IV  was administered. The blood pressure and heart rate recovered quickly. Intracoronary nitroglycerin was administered, 200 g, 2. Despite IC nitroglycerin the distal artery was documented to be severely and diffusely diseased and small in caliber. The PDA and left ventricular branches were tiny and diffusely diseased. After some consideration we placed a 2.25 x 28 Promus Premier drug-eluting stent just proximal to the PDA and deployed at 15 atm. We then placed in an overlapping fashion a 28 x 2.75 Promus Premier at 14 atm. Postdilatation was then performed with a 2.75 x 18 [place] from the distal stent back to the proximal stent margin of the overlapping stent segment. TIMI grade 3 flow was noted. Distal RCA branches were diffusely diseased but had TIMI grade 3 runoff. Symptoms resolved. ST segment elevation resolved.  Left coronary angiography demonstrated moderately severe mid obtuse marginal #2 disease. A 70% distal LAD lesion was also noted.  Wrist pain was used with good hemostasis.   CONTRAST:  Total of 170 cc.  COMPLICATIONS:  Transient bradycardia and hypotension responding to atropine and IV fluid   HEMODYNAMICS:  Aortic pressure 196/123 mmHg initially while still having severe pain. After recanalizing the right coronary and placing stents 139/99 mmHg; LV pressure 139 over 7 mmHg; LVEDP 12 mmHg  ANGIOGRAPHIC DATA:   The left main coronary artery is widely patent.  The left anterior descending artery is widely patent with irregularities noted in the proximal and mid vessel. 3 diagonal branches arise proximally. The larger of the diagonal branches trifurcates. Ostium of this diagonal contains a 70% stenosis.  The left circumflex artery is patent. He gives origin to 2 obtuse marginal branches. The first marginal contains segmental 70% stenosis. This  branch is a small of the tube marginals. The second larger marginal contains mid segment eccentric 80% stenosis..  The right coronary artery is totally  occluded pre-intervention. Postintervention, the vessel is noted to be diffusely diseased involving the PDA and continuation to the left ventricular branch.the acute marginal branch is severely and diffusely diseased. After angioplasty the distal to mid RCA is widely patent with TIMI grade 3 flow as noted below.  PCI RESULTS: The totally occluded distal RCA was recanalized with angioplasty and then stented in an overlapping fashion using a 28 x 2.25 Promus Premier distally and overlapping more proximally with a 28 x 2.75 mm Promus Premier. The entire segment was then postdilated up to 15 atm using a  15 mm 2.75 diameter balloon. 0% stenosis was noted within the stented region. Severe diffuse disease in the PDA and left ventricular branches was noted.  LEFT VENTRICULOGRAM:  Left ventricular angiogram was done in the 30 RAO projection and revealed normal cavity size with mild inferior hypokinesis and EF of 60%.   IMPRESSIONS:  1. Acute inferior ST elevation myocardial infarction due to total occlusion of the distal RCA.  2. Successful PTCA and stenting of the distal RCA. Unfortunately the PDA and left ventricular branches are small and severely/diffusely diseased. And plasty and stenting was successful with TIMI grade 3 flow reestablished, 0% stenosis within the stented segment, and resolution of symptoms.because of the severe distal vessel disease, stent thrombosis is more likely than usual because of poor runoff.  3. Moderate to severe first and second obtuse marginal stenosis and moderate distal LAD disease as noted.  3. Normal left ventricular function   RECOMMENDATION:  Aspirin and Plavix (history of intracranial hemorrhage)  P2Y12 in a.m.  Aggressive blood pressure control(continued ACE inhibitor, start beta blocker therapy, and consider other agents as needed).  Aggressive lipid management.

## 2014-02-18 ENCOUNTER — Encounter (HOSPITAL_COMMUNITY): Payer: Self-pay | Admitting: General Practice

## 2014-02-18 DIAGNOSIS — R739 Hyperglycemia, unspecified: Secondary | ICD-10-CM

## 2014-02-18 DIAGNOSIS — I2510 Atherosclerotic heart disease of native coronary artery without angina pectoris: Secondary | ICD-10-CM

## 2014-02-18 DIAGNOSIS — E669 Obesity, unspecified: Secondary | ICD-10-CM

## 2014-02-18 DIAGNOSIS — Z9861 Coronary angioplasty status: Secondary | ICD-10-CM

## 2014-02-18 LAB — BASIC METABOLIC PANEL
Anion gap: 14 (ref 5–15)
BUN: 13 mg/dL (ref 6–23)
CO2: 21 meq/L (ref 19–32)
CREATININE: 1.18 mg/dL (ref 0.50–1.35)
Calcium: 8.5 mg/dL (ref 8.4–10.5)
Chloride: 106 mEq/L (ref 96–112)
GFR calc non Af Amer: 71 mL/min — ABNORMAL LOW (ref >90)
GFR, EST AFRICAN AMERICAN: 82 mL/min — AB (ref >90)
Glucose, Bld: 100 mg/dL — ABNORMAL HIGH (ref 70–99)
Potassium: 4 mEq/L (ref 3.7–5.3)
Sodium: 141 mEq/L (ref 137–147)

## 2014-02-18 LAB — PLATELET INHIBITION P2Y12: Platelet Function  P2Y12: 208 [PRU] (ref 194–418)

## 2014-02-18 LAB — TROPONIN I
TROPONIN I: 12.16 ng/mL — AB (ref <0.30)
Troponin I: 8.35 ng/mL (ref <0.30)
Troponin I: 8.01 ng/mL (ref <0.30)

## 2014-02-18 LAB — CBC
HEMATOCRIT: 37.3 % — AB (ref 39.0–52.0)
HEMOGLOBIN: 11.8 g/dL — AB (ref 13.0–17.0)
MCH: 22.9 pg — AB (ref 26.0–34.0)
MCHC: 31.6 g/dL (ref 30.0–36.0)
MCV: 72.3 fL — ABNORMAL LOW (ref 78.0–100.0)
Platelets: 275 10*3/uL (ref 150–400)
RBC: 5.16 MIL/uL (ref 4.22–5.81)
RDW: 15.4 % (ref 11.5–15.5)
WBC: 8.9 10*3/uL (ref 4.0–10.5)

## 2014-02-18 LAB — HEMOGLOBIN A1C
Hgb A1c MFr Bld: 5.6 % (ref <5.7)
Mean Plasma Glucose: 114 mg/dL (ref <117)

## 2014-02-18 MED ORDER — METOPROLOL TARTRATE 50 MG PO TABS
50.0000 mg | ORAL_TABLET | Freq: Two times a day (BID) | ORAL | Status: DC
  Administered 2014-02-18 – 2014-02-19 (×2): 50 mg via ORAL
  Filled 2014-02-18 (×2): qty 1

## 2014-02-18 MED ORDER — NITROGLYCERIN 0.4 MG SL SUBL: 0.4000 mg | SUBLINGUAL_TABLET | SUBLINGUAL | Status: DC | PRN

## 2014-02-18 MED ORDER — PANTOPRAZOLE SODIUM 40 MG PO TBEC: 40.0000 mg | DELAYED_RELEASE_TABLET | Freq: Every day | ORAL | Status: DC

## 2014-02-18 MED ORDER — PANTOPRAZOLE SODIUM 40 MG PO TBEC
40.0000 mg | DELAYED_RELEASE_TABLET | Freq: Every day | ORAL | Status: DC
  Administered 2014-02-18 – 2014-02-19 (×2): 40 mg via ORAL
  Filled 2014-02-18 (×2): qty 1

## 2014-02-18 MED FILL — Sodium Chloride IV Soln 0.9%: INTRAVENOUS | Qty: 50 | Status: AC

## 2014-02-18 NOTE — Progress Notes (Signed)
CARDIAC REHAB PHASE I   PRE:  Rate/Rhythm: 72 SR  BP:  Supine:   Sitting: 149/97  Standing:    SaO2:   MODE:  Ambulation: 350 ft   POST:  Rate/Rhythm: 100 SR  BP:  Supine:   Sitting: 166/90  Standing:    SaO2:  0915-1000 Pt walked 350 ft on RA with rolling walker(uses cane due to CVA) with steady gait. Limps. Denied CP. Tolerated well for first walk. To recliner after walk. Complete ed except for ex ed and Phase 2 which will be completed tomorrow. Reviewed heart healthy diet, stent/plavix, MI restrictions, risk factors and NTG use and importance of notifying cardiologist of any recurring CP due to cfx blockage. Pt voiced understanding.   George Nuttingharlene Johnathin Vanderschaaf, RN BSN  02/18/2014 10:19 AM

## 2014-02-18 NOTE — Progress Notes (Signed)
       Patient Name: George Villa Date of Encounter: 02/18/2014    SUBJECTIVE: The sense that he is unable take a deep breath because there is something wrapped around his chest has resolved. He denies pain. He denies dyspnea. He has had no access site complications.  TELEMETRY:  Normal sinus rhythm Filed Vitals:   02/18/14 0500 02/18/14 0600 02/18/14 0700 02/18/14 0800  BP: 126/77 146/86 141/95 126/78  Pulse: 73 53  63  Temp:    98.2 F (36.8 C)  TempSrc:    Oral  Resp:      Height:      Weight:      SpO2: 98% 98%  97%    Intake/Output Summary (Last 24 hours) at 02/18/14 0847 Last data filed at 02/18/14 0700  Gross per 24 hour  Intake 1890.83 ml  Output   1300 ml  Net 590.83 ml   LABS: Basic Metabolic Panel:  Recent Labs  40/98/1110/02/21 1238 02/17/14 1242 02/18/14 0245  NA 140 139 141  K 3.9 3.8 4.0  CL 105 111 106  CO2 20  --  21  GLUCOSE 134* 134* 100*  BUN 15 14 13   CREATININE 1.18 1.20 1.18  CALCIUM 8.9  --  8.5   CBC:  Recent Labs  02/17/14 1238 02/17/14 1242 02/18/14 0245  WBC 13.0*  --  8.9  NEUTROABS 11.4*  --   --   HGB 13.2 14.3 11.8*  HCT 41.8 42.0 37.3*  MCV 73.1*  --  72.3*  PLT 290  --  275   Cardiac Enzymes:  Recent Labs  02/17/14 1238  CKTOTAL 167  CKMB 3.7  TROPONINI <0.30    BNP: Invalid input(s): POCBNP Hemoglobin A1C: No results for input(s): HGBA1C in the last 72 hours. Fasting Lipid Panel:  Recent Labs  02/17/14 1238  CHOL 177  HDL 44  LDLCALC 109*  TRIG 121  CHOLHDL 4.0   P2Y12 :  208  Radiology/Studies:  No new data  Physical Exam: Blood pressure 126/78, pulse 63, temperature 98.2 F (36.8 C), temperature source Oral, resp. rate 15, height 5\' 7"  (1.702 m), weight 202 lb 13.2 oz (92 kg), SpO2 97 %. Weight change:   Wt Readings from Last 3 Encounters:  02/17/14 202 lb 13.2 oz (92 kg)  05/20/13 215 lb (97.523 kg)    S4 gallop Clear chest Right radial access site is unremarkable No  edema  ASSESSMENT:  1. Acute inferior wall myocardial infarction treated with angioplasty and stenting. Distal vessel is diffusely disease. Risk of stent thrombosis is high due to poor runoff. 2. Severe hypertension, better controlled on medical regimen 3. LDL was not bad at 109.  Plan:  1. Phase I rehabilitation 2. Discontinue IV nitroglycerin 3. Increase beta blocker therapy 4. Probable discharge in a.m. 5. Plan to treat circumflex lesion only if angina or other symptoms, otherwise medical therapy  Signed, Lesleigh NoeSMITH III,Teresa Lemmerman W 02/18/2014, 8:47 AM

## 2014-02-19 DIAGNOSIS — I2119 ST elevation (STEMI) myocardial infarction involving other coronary artery of inferior wall: Principal | ICD-10-CM

## 2014-02-19 LAB — TROPONIN I: TROPONIN I: 7.61 ng/mL — AB (ref <0.30)

## 2014-02-19 MED ORDER — ATORVASTATIN CALCIUM 80 MG PO TABS: 80.0000 mg | ORAL_TABLET | Freq: Every day | ORAL | Status: DC

## 2014-02-19 MED ORDER — ASPIRIN 325 MG PO TBEC: 325.0000 mg | DELAYED_RELEASE_TABLET | Freq: Every day | ORAL | Status: DC

## 2014-02-19 MED ORDER — PANTOPRAZOLE SODIUM 40 MG PO TBEC: 40.0000 mg | DELAYED_RELEASE_TABLET | Freq: Every day | ORAL | Status: DC

## 2014-02-19 MED ORDER — CLOPIDOGREL BISULFATE 75 MG PO TABS: 75.0000 mg | ORAL_TABLET | Freq: Every day | ORAL | Status: DC

## 2014-02-19 MED ORDER — AMLODIPINE BESYLATE 5 MG PO TABS: 5.0000 mg | ORAL_TABLET | Freq: Every day | ORAL | Status: DC

## 2014-02-19 MED ORDER — NITROGLYCERIN 0.4 MG SL SUBL: 0.4000 mg | SUBLINGUAL_TABLET | SUBLINGUAL | Status: AC | PRN

## 2014-02-19 MED ORDER — METOPROLOL TARTRATE 50 MG PO TABS: 50.0000 mg | ORAL_TABLET | Freq: Two times a day (BID) | ORAL | Status: DC

## 2014-02-19 NOTE — Progress Notes (Signed)
CARDIAC REHAB PHASE I   PRE:  Rate/Rhythm: 73 SR  BP:  Supine: 118/74  Sitting:   Standing:    SaO2:   MODE:  Ambulation: 350 ft   POST:  Rate/Rhythm: 71 SR  BP:  Supine: 138/90  Sitting:   Standing:    SaO2:  0910-0930 Pt walked 350 ft with rolling walker with steady gait. No CP. Gave pt written ex ed and reviewed with pt who voiced understanding. Discussed CRP 2 and pt declined due to work schedule. Pt stated he can work up to 30 minutes walking with cane.   Luetta Nuttingharlene Prima Rayner, RN BSN  02/19/2014 9:29 AM

## 2014-02-19 NOTE — Discharge Summary (Signed)
Physician Discharge Summary  Patient ID: George Villa MRN: [id number] DOB/AGE: May 02, 1965 48 y.o.   Primary Cardiologist: Dr. [name]  Admit date: 02/17/2014 Discharge date: 02/19/2014  Admission Diagnoses: Inferior STEMI  Discharge Diagnoses:  Principal Problem:   ST elevation myocardial infarction (STEMI) of inferior wall Active Problems:   Accelerated hypertension   History of cerebellar hemorrhage 2006   GERD (gastroesophageal reflux disease)   Hyperglycemia   Obesity   ST elevation myocardial infarction (STEMI) involving right coronary artery in recovery phase   CAD S/P PCI of dRCA - 2 DES (2.25 x 28 Promus Premier & 2.75 x 28 Promus Premier -- postdilated to 2.75 mm) - distal RCA branches small & diffusely diseased.   Discharged Condition: stable  Hospital Course:  The patient  is a 48 y/o nonsmoking M with a history of HTN, cerebellar hemorrhage 05/2004 s/p evacuation (in setting of high BP), and chronic back pain who [facility] with an inferior STEMI. He denied any prior cardiac hx or known family history of CAD (only stroke). 2D Echo in 2006 in setting of his stroke showed EF 55-60%. He developed symptoms around 9am day of presentation when he developed SOB and dizziness. The symptoms persisted so EMS was called. EKG demonstrated inferior STEMI and he was markedly hypertensive at 200/100. He was subsequently brought emergently to the cath lab. The procedure was performed by Dr. [name]. Access was obtained via the right radial artery. He was found to have total occlusion of the distal RCA. He underwent successful PTCA and stenting of the segment utilizing overlapping DES. He was also noted to have residual disease in the OM1 with 70% segmental stenosis and 80% in the mid portion of the OM2. The PDA was also severely/diffusely disease, however was not amendable to PCI due to small size. Medical therapy was recommended. Left ventriculography revealed  mild inferior hypokenisis and normal systolic function with EF of 60%. He tolerated the procedure well and left the cath lab in stable condition. He was placed on DAPT with ASA + Plavix. P2Y12 testing revealed that is is a "Plavix responder" with level at 208. He was also placed on a high dose statin, a BB and ACE-I. He denied any recurrent chest pain. Vital signs remained stable. Right radial access site also stable. An echocardiogram was performed post cath and also demonstrated normal LVF. EF was estimated at 55% with basal inferior severe hypokinesis. Grade 2 diastolic dysfunction was noted. He had no difficulties ambulating with cardiac rehab. He was last seen and examined by Dr. Clifton JamesMcAlhany who determined he was stable for discharge home. He is scheduled to follow-up with Nada BoozerLaura Ingold, NP, [facility]. He will be followed long term by Dr. [name] [facility].   Consults: None  Significant Diagnostic Studies:   Emergent LHC 02/17/14 HEMODYNAMICS: Aortic pressure 196/123 mmHg initially while still having severe pain. After recanalizing the right coronary and placing stents 139/99 mmHg; LV pressure 139 over 7 mmHg; LVEDP 12 mmHg  ANGIOGRAPHIC DATA: The left main coronary artery is widely patent.  The left anterior descending artery is widely patent with irregularities noted in the proximal and mid vessel. 3 diagonal branches arise proximally. The larger of the diagonal branches trifurcates. Ostium of this diagonal contains a 70% stenosis.  The left circumflex artery is patent. He gives origin to 2 obtuse marginal branches. The first marginal contains segmental 70% stenosis. This branch is a small of the tube marginals.  The second larger marginal contains mid segment eccentric 80% stenosis..  The right coronary artery is totally occluded pre-intervention. Postintervention, the vessel is noted to be diffusely diseased involving the PDA and continuation to the left ventricular  branch.the acute marginal branch is severely and diffusely diseased. After angioplasty the distal to mid RCA is widely patent with TIMI grade 3 flow as noted below.    2D echo 02/17/14 Study Conclusions  - Left ventricle: The cavity size was normal. Wall thickness was increased in a pattern of mild LVH. The estimated ejection fraction was 55%. Basal inferior severe hypokinesis. Features are consistent with a pseudonormal left ventricular filling pattern, with concomitant abnormal relaxation and increased filling pressure (grade 2 diastolic dysfunction). - Aortic valve: There was no stenosis. There was no significant regurgitation. - Mitral valve: Mildly calcified annulus. - Right ventricle: The cavity size was normal. Systolic function was normal. - Pulmonary arteries: No complete TR doppler jet so unable to estimate PA systolic pressure. - Inferior vena cava: The vessel was normal in size. The respirophasic diameter changes were in the normal range (>= 50%), consistent with normal central venous pressure.  Impressions:  - Normal LV size with mild LV hypertrophy. EF 55% with basal inferior severe hypokinesis. Normal RV size and systolic function. No significant valvular abnormalities.   Treatments: See Hospital Course  Discharge Exam: Blood pressure 128/75, pulse 77, temperature 98.1 F (36.7 C), temperature source Oral, resp. rate 16, height 5\' 7"  (1.702 m), weight 198 lb 9.6 oz (90.084 kg), SpO2 98 %.   Disposition: 01-Home or Self Care      Discharge Instructions    Diet - low sodium heart healthy    Complete by:  As directed      Increase activity slowly    Complete by:  As directed             Medication List    STOP taking these medications        omeprazole 20 MG capsule  Commonly known as:  PRILOSEC      TAKE these medications        amLODipine 5 MG tablet  Commonly known as:  NORVASC  Take 1 tablet (5 mg total) by mouth  daily.     aspirin 325 MG EC tablet  Take 1 tablet (325 mg total) by mouth daily.     atorvastatin 80 MG tablet  Commonly known as:  LIPITOR  Take 1 tablet (80 mg total) by mouth daily at 6 PM.     clopidogrel 75 MG tablet  Commonly known as:  PLAVIX  Take 1 tablet (75 mg total) by mouth daily with breakfast.     lisinopril 20 MG tablet  Commonly known as:  PRINIVIL,ZESTRIL  Take 20 mg by mouth daily.     metoprolol 50 MG tablet  Commonly known as:  LOPRESSOR  Take 1 tablet (50 mg total) by mouth 2 (two) times daily.     nitroGLYCERIN 0.4 MG SL tablet  Commonly known as:  NITROSTAT  Place 1 tablet (0.4 mg total) under the tongue every 5 (five) minutes as needed for chest pain.     oxyCODONE-acetaminophen 7.5-325 MG per tablet  Commonly known as:  PERCOCET  Take 1 tablet by mouth every 4 (four) hours as needed for pain.     oxyCODONE-acetaminophen 10-325 MG per tablet  Commonly known as:  PERCOCET  Take 1 tablet by mouth 4 (four) times daily.     pantoprazole 40 MG  tablet  Commonly known as:  PROTONIX  Take 1 tablet (40 mg total) by mouth daily.       Follow-up Information    Follow up with INGOLD,LAURA R, NP On 03/09/2014.   Specialty:  Cardiology   Why:  11:30 am (Dr. Michaelle CopasSmith's PA. [facility])   Contact information:   [address] [place] [address] [phone]      TIME SPENT ON DISCHARGE, INCLUDING PHYSICIAN TIME: > 30 MINUTES  Signed: Levis Nazir 02/19/2014, 10:32 AM

## 2014-02-19 NOTE — Plan of Care (Signed)
Problem: Discharge Progression Outcomes Goal: No anginal pain Outcome: Completed/Met Date Met:  02/19/14 Goal: Hemodynamically stable Outcome: Completed/Met Date Met:  24/17/53 Goal: Complications resolved/controlled Outcome: Completed/Met Date Met:  02/19/14 Goal: Vascular site scale level 0 - I Vascular Site Scale Level 0: No bruising/bleeding/hematoma Level I (Mild): Bruising/Ecchymosis, minimal bleeding/ooozing, palpable hematoma < 3 cm Level II (Moderate): Bleeding not affecting hemodynamic parameters, pseudoaneurysm, palpable hematoma > 3 cm Level III (Severe) Bleeding which affects hemodynamic parameters or retroperitoneal hemorrhage  Outcome: Completed/Met Date Met:  02/19/14 Goal: Tolerates diet Outcome: Completed/Met Date Met:  02/19/14 Goal: Activity appropriate for discharge plan Outcome: Completed/Met Date Met:  02/19/14

## 2014-02-19 NOTE — Progress Notes (Signed)
SUBJECTIVE: No chest pain or SOB. Ambulating well  BP 128/75 mmHg  Pulse 77  Temp(Src) 98.1 F (36.7 C) (Oral)  Resp 16  Ht _0  (1.702 m)  Wt 198 lb 9.6 oz (90.084 kg)  BMI 31.10 kg/m2  SpO2 98%  Intake/Output Summary (Last 24 hours) at 02/19/14 0850 Last data filed at 02/18/14 2156  Gross per 24 hour  Intake    240 ml  Output      0 ml  Net    240 ml    PHYSICAL EXAM General: Well developed, well nourished, in no acute distress. Alert and oriented x 3.  Psych:  Good affect, responds appropriately Neck: No JVD. No masses noted.  Lungs: Clear bilaterally with no wheezes or rhonci noted.  Heart: RRR with no murmurs noted. Abdomen: Bowel sounds are present. Soft, non-tender.  Extremities: No lower extremity edema.   LABS: Basic Metabolic Panel:  Recent Labs  02/17/14 1238 02/17/14 1242 02/18/14 0245  NA 140 139 141  K 3.9 3.8 4.0  CL 105 111 106  CO2 20  --  21  GLUCOSE 134* 134* 100*  BUN _1 CREATININE 1.18 1.20 1.18  CALCIUM 8.9  --  8.5   CBC:  Recent Labs  02/17/14 1238 02/17/14 1242 02/18/14 0245  WBC 13.0*  --  8.9  NEUTROABS 11.4*  --   --   HGB 13.2 14.3 11.8*  HCT 41.8 42.0 37.3*  MCV 73.1*  --  72.3*  PLT 290  --  275   Cardiac Enzymes:  Recent Labs  02/17/14 1238  02/18/14 1415 02/18/14 2049 02/19/14 0230  CKTOTAL 167  --   --   --   --   CKMB 3.7  --   --   --   --   TROPONINI <0.30  < > 8.35* 8.01* 7.61*  < > = values in this interval not displayed. Fasting Lipid Panel:  Recent Labs  02/17/14 1238  CHOL 177  HDL 44  LDLCALC 109*  TRIG 121  CHOLHDL 4.0    Current Meds: . amLODipine  5 mg Oral Daily  . aspirin EC  325 mg Oral Daily  . atorvastatin  80 mg Oral q1800  . clopidogrel  75 mg Oral Q breakfast  . lisinopril  20 mg Oral Daily  . metoprolol tartrate  50 mg Oral BID  . pantoprazole  40 mg Oral Daily   Cardiac cath 02/17/14: HEMODYNAMICS: Aortic pressure 196/123 mmHg initially while still  having severe pain. After recanalizing the right coronary and placing stents 139/99 mmHg; LV pressure 139 over 7 mmHg; LVEDP 12 mmHg  ANGIOGRAPHIC DATA: The left main coronary artery is widely patent.  The left anterior descending artery is widely patent with irregularities noted in the proximal and mid vessel. 3 diagonal branches arise proximally. The larger of the diagonal branches trifurcates. Ostium of this diagonal contains a 70% stenosis.  The left circumflex artery is patent. He gives origin to 2 obtuse marginal branches. The first marginal contains segmental 70% stenosis. This branch is a small of the tube marginals. The second larger marginal contains mid segment eccentric 80% stenosis..  The right coronary artery is totally occluded pre-intervention. Postintervention, the vessel is noted to be diffusely diseased involving the PDA and continuation to the left ventricular branch.the acute marginal branch is severely and diffusely diseased. After angioplasty the distal to mid RCA is widely patent with TIMI grade 3 flow as noted below.  PCI RESULTS: The totally occluded distal RCA was recanalized with angioplasty and then stented in an overlapping fashion using a 28 x 2.25 Promus Premier distally and overlapping more proximally with a 28 x 2.75 mm Promus Premier. The entire segment was then postdilated up to 15 atm using a [place] 15 mm 2.75 diameter balloon. 0% stenosis was noted within the stented region. Severe diffuse disease in the PDA and left ventricular branches was noted.  LEFT VENTRICULOGRAM: Left ventricular angiogram was done in the 30 RAO projection and revealed normal cavity size with mild inferior hypokinesis and EF of 60%.  Echo 02/17/14: Left ventricle: The cavity size was normal. Wall thickness was increased in a pattern of mild LVH. The estimated ejection fraction was 55%. Basal inferior severe hypokinesis. Features are consistent with a pseudonormal left ventricular  filling pattern, with concomitant abnormal relaxation and increased filling pressure (grade 2 diastolic dysfunction). - Aortic valve: There was no stenosis. There was no significant regurgitation. - Mitral valve: Mildly calcified annulus. - Right ventricle: The cavity size was normal. Systolic function was normal. - Pulmonary arteries: No complete TR doppler jet so unable to estimate PA systolic pressure. - Inferior vena cava: The vessel was normal in size. The respirophasic diameter changes were in the normal range (>= 50%), consistent with normal central venous pressure.  Impressions:  - Normal LV size with mild LV hypertrophy. EF 55% with basal inferior severe hypokinesis. Normal RV size and systolic function. No significant valvular abnormalities.  ASSESSMENT AND PLAN:  1. CAD: Admitted with inferior STEMI on 02/17/14. Overlapping DES RCA. He is stable on ASA and Plavix. Continue statin, beta blocker, Ace-inh. LV function is preserved. Medical management of distal RCA and circumflex disease.   2. HTN: BP controlled on current therapy.   Discharge home today. Follow up with Dr. Tamala Julian in 2-3 weeks.   George Villa  11/13/20158:50 AM

## 2014-02-19 NOTE — Discharge Summary (Addendum)
Chrissy, charge RN, reviewed discharge instructions with patient. Patient denies questions. AVS given to patient. Patient aware that new prescription called [facility] [address]. IV and telemetry previously discontinued. Patient's ride to arrive at 11AM. Patient to notify of arrival of his ride so transport can be requested.

## 2014-03-09 ENCOUNTER — Ambulatory Visit: Payer: Medicaid Other | Admitting: Cardiology

## 2014-03-18 ENCOUNTER — Encounter (HOSPITAL_COMMUNITY): Payer: Self-pay | Admitting: Interventional Cardiology

## 2014-07-15 ENCOUNTER — Inpatient Hospital Stay (HOSPITAL_COMMUNITY)
Admission: EM | Admit: 2014-07-15 | Discharge: 2014-07-16 | DRG: 066 | Disposition: A | Discharge status: Home / Self Care | Payer: Medicaid Other | Attending: Internal Medicine | Admitting: Internal Medicine

## 2014-07-15 ENCOUNTER — Inpatient Hospital Stay (HOSPITAL_COMMUNITY): Payer: Medicaid Other

## 2014-07-15 ENCOUNTER — Encounter (HOSPITAL_COMMUNITY): Payer: Self-pay | Admitting: Radiology

## 2014-07-15 ENCOUNTER — Emergency Department (HOSPITAL_COMMUNITY): Payer: Medicaid Other

## 2014-07-15 DIAGNOSIS — Z9114 Patient's other noncompliance with medication regimen: Secondary | ICD-10-CM | POA: Diagnosis present

## 2014-07-15 DIAGNOSIS — I639 Cerebral infarction, unspecified: Secondary | ICD-10-CM | POA: Diagnosis present

## 2014-07-15 DIAGNOSIS — I252 Old myocardial infarction: Secondary | ICD-10-CM

## 2014-07-15 DIAGNOSIS — I2510 Atherosclerotic heart disease of native coronary artery without angina pectoris: Secondary | ICD-10-CM | POA: Diagnosis present

## 2014-07-15 DIAGNOSIS — D509 Iron deficiency anemia, unspecified: Secondary | ICD-10-CM | POA: Diagnosis present

## 2014-07-15 DIAGNOSIS — I6789 Other cerebrovascular disease: Secondary | ICD-10-CM | POA: Diagnosis not present

## 2014-07-15 DIAGNOSIS — Z6829 Body mass index (BMI) 29.0-29.9, adult: Secondary | ICD-10-CM | POA: Diagnosis not present

## 2014-07-15 DIAGNOSIS — Z8673 Personal history of transient ischemic attack (TIA), and cerebral infarction without residual deficits: Secondary | ICD-10-CM

## 2014-07-15 DIAGNOSIS — H5340 Unspecified visual field defects: Secondary | ICD-10-CM | POA: Diagnosis present

## 2014-07-15 DIAGNOSIS — R278 Other lack of coordination: Secondary | ICD-10-CM | POA: Diagnosis present

## 2014-07-15 DIAGNOSIS — M545 Low back pain: Secondary | ICD-10-CM | POA: Diagnosis present

## 2014-07-15 DIAGNOSIS — I10 Essential (primary) hypertension: Secondary | ICD-10-CM | POA: Diagnosis present

## 2014-07-15 DIAGNOSIS — I63532 Cerebral infarction due to unspecified occlusion or stenosis of left posterior cerebral artery: Principal | ICD-10-CM | POA: Diagnosis present

## 2014-07-15 DIAGNOSIS — E780 Pure hypercholesterolemia: Secondary | ICD-10-CM | POA: Diagnosis present

## 2014-07-15 DIAGNOSIS — H538 Other visual disturbances: Secondary | ICD-10-CM | POA: Diagnosis present

## 2014-07-15 DIAGNOSIS — Z955 Presence of coronary angioplasty implant and graft: Secondary | ICD-10-CM | POA: Diagnosis not present

## 2014-07-15 DIAGNOSIS — Z7982 Long term (current) use of aspirin: Secondary | ICD-10-CM | POA: Diagnosis not present

## 2014-07-15 DIAGNOSIS — K219 Gastro-esophageal reflux disease without esophagitis: Secondary | ICD-10-CM | POA: Diagnosis present

## 2014-07-15 DIAGNOSIS — G8929 Other chronic pain: Secondary | ICD-10-CM | POA: Diagnosis present

## 2014-07-15 DIAGNOSIS — Z79899 Other long term (current) drug therapy: Secondary | ICD-10-CM

## 2014-07-15 DIAGNOSIS — Z95818 Presence of other cardiac implants and grafts: Secondary | ICD-10-CM

## 2014-07-15 DIAGNOSIS — E669 Obesity, unspecified: Secondary | ICD-10-CM | POA: Diagnosis present

## 2014-07-15 DIAGNOSIS — E785 Hyperlipidemia, unspecified: Secondary | ICD-10-CM | POA: Diagnosis present

## 2014-07-15 DIAGNOSIS — R51 Headache: Secondary | ICD-10-CM | POA: Diagnosis present

## 2014-07-15 DIAGNOSIS — Z9861 Coronary angioplasty status: Secondary | ICD-10-CM

## 2014-07-15 DIAGNOSIS — Z7902 Long term (current) use of antithrombotics/antiplatelets: Secondary | ICD-10-CM | POA: Diagnosis not present

## 2014-07-15 DIAGNOSIS — R9431 Abnormal electrocardiogram [ECG] [EKG]: Secondary | ICD-10-CM | POA: Diagnosis present

## 2014-07-15 DIAGNOSIS — Z8679 Personal history of other diseases of the circulatory system: Secondary | ICD-10-CM

## 2014-07-15 LAB — BASIC METABOLIC PANEL
Anion gap: 9 (ref 5–15)
BUN: 15 mg/dL (ref 6–23)
CO2: 23 mmol/L (ref 19–32)
CREATININE: 1.34 mg/dL (ref 0.50–1.35)
Calcium: 8.8 mg/dL (ref 8.4–10.5)
Chloride: 107 mmol/L (ref 96–112)
GFR calc non Af Amer: 60 mL/min — ABNORMAL LOW (ref >90)
GFR, EST AFRICAN AMERICAN: 70 mL/min — AB (ref >90)
Glucose, Bld: 97 mg/dL (ref 70–99)
Potassium: 3.6 mmol/L (ref 3.5–5.1)
SODIUM: 139 mmol/L (ref 135–145)

## 2014-07-15 LAB — HEPATIC FUNCTION PANEL
ALBUMIN: 3.2 g/dL — AB (ref 3.5–5.2)
ALT: 22 U/L (ref 0–53)
AST: 27 U/L (ref 0–37)
Alkaline Phosphatase: 64 U/L (ref 39–117)
BILIRUBIN TOTAL: 0.6 mg/dL (ref 0.3–1.2)
Bilirubin, Direct: 0.2 mg/dL (ref 0.0–0.5)
Indirect Bilirubin: 0.4 mg/dL (ref 0.3–0.9)
Total Protein: 6.2 g/dL (ref 6.0–8.3)

## 2014-07-15 LAB — TSH: TSH: 0.911 u[IU]/mL (ref 0.350–4.500)

## 2014-07-15 LAB — RAPID URINE DRUG SCREEN, HOSP PERFORMED
Amphetamines: NOT DETECTED
BENZODIAZEPINES: NOT DETECTED
Barbiturates: NOT DETECTED
COCAINE: NOT DETECTED
Opiates: NOT DETECTED
TETRAHYDROCANNABINOL: NOT DETECTED

## 2014-07-15 LAB — LIPID PANEL
CHOL/HDL RATIO: 5.2 ratio
CHOLESTEROL: 199 mg/dL (ref 0–200)
HDL: 38 mg/dL — ABNORMAL LOW (ref >39)
LDL Cholesterol: 145 mg/dL — ABNORMAL HIGH (ref 0–99)
TRIGLYCERIDES: 81 mg/dL (ref <150)
VLDL: 16 mg/dL (ref 0–40)

## 2014-07-15 LAB — CBC WITH DIFFERENTIAL/PLATELET
BASOS ABS: 0.1 10*3/uL (ref 0.0–0.1)
Basophils Relative: 1 % (ref 0–1)
EOS PCT: 4 % (ref 0–5)
Eosinophils Absolute: 0.3 10*3/uL (ref 0.0–0.7)
HEMATOCRIT: 40.2 % (ref 39.0–52.0)
Hemoglobin: 12.3 g/dL — ABNORMAL LOW (ref 13.0–17.0)
Lymphocytes Relative: 14 % (ref 12–46)
Lymphs Abs: 1.1 10*3/uL (ref 0.7–4.0)
MCH: 22.5 pg — ABNORMAL LOW (ref 26.0–34.0)
MCHC: 30.6 g/dL (ref 30.0–36.0)
MCV: 73.5 fL — AB (ref 78.0–100.0)
MONO ABS: 0.5 10*3/uL (ref 0.1–1.0)
MONOS PCT: 7 % (ref 3–12)
NEUTROS ABS: 5.6 10*3/uL (ref 1.7–7.7)
Neutrophils Relative %: 74 % (ref 43–77)
Platelets: 248 10*3/uL (ref 150–400)
RBC: 5.47 MIL/uL (ref 4.22–5.81)
RDW: 15.6 % — AB (ref 11.5–15.5)
WBC: 7.6 10*3/uL (ref 4.0–10.5)

## 2014-07-15 LAB — PHOSPHORUS: PHOSPHORUS: 2.6 mg/dL (ref 2.3–4.6)

## 2014-07-15 LAB — RETICULOCYTES
RBC.: 5.21 MIL/uL (ref 4.22–5.81)
RETIC COUNT ABSOLUTE: 62.5 10*3/uL (ref 19.0–186.0)
Retic Ct Pct: 1.2 % (ref 0.4–3.1)

## 2014-07-15 LAB — MRSA PCR SCREENING: MRSA by PCR: NEGATIVE

## 2014-07-15 LAB — PROTIME-INR
INR: 1.05 (ref 0.00–1.49)
PROTHROMBIN TIME: 13.8 s (ref 11.6–15.2)

## 2014-07-15 LAB — ETHANOL

## 2014-07-15 LAB — MAGNESIUM: Magnesium: 2 mg/dL (ref 1.5–2.5)

## 2014-07-15 MED ORDER — SODIUM CHLORIDE 0.9 % IJ SOLN
3.0000 mL | Freq: Two times a day (BID) | INTRAMUSCULAR | Status: DC
  Administered 2014-07-15 – 2014-07-16 (×2): 3 mL via INTRAVENOUS

## 2014-07-15 MED ORDER — OXYCODONE-ACETAMINOPHEN 5-325 MG PO TABS
1.0000 | ORAL_TABLET | ORAL | Status: DC | PRN
Start: 2014-07-15 — End: 2014-07-16
  Administered 2014-07-15 – 2014-07-16 (×3): 1 via ORAL
  Filled 2014-07-15 (×3): qty 1

## 2014-07-15 MED ORDER — DIPHENHYDRAMINE HCL 50 MG/ML IJ SOLN
25.0000 mg | Freq: Once | INTRAMUSCULAR | Status: AC
  Administered 2014-07-15: 25 mg via INTRAVENOUS
  Filled 2014-07-15: qty 1

## 2014-07-15 MED ORDER — PROCHLORPERAZINE MALEATE 10 MG PO TABS
10.0000 mg | ORAL_TABLET | Freq: Once | ORAL | Status: AC
  Administered 2014-07-15: 10 mg via ORAL
  Filled 2014-07-15: qty 1

## 2014-07-15 MED ORDER — SODIUM CHLORIDE 0.9 % IV BOLUS (SEPSIS)
1000.0000 mL | Freq: Once | INTRAVENOUS | Status: AC
  Administered 2014-07-15: 1000 mL via INTRAVENOUS

## 2014-07-15 MED ORDER — OXYCODONE-ACETAMINOPHEN 10-325 MG PO TABS: 1.0000 | ORAL_TABLET | ORAL | Status: DC | PRN

## 2014-07-15 MED ORDER — CLOPIDOGREL BISULFATE 75 MG PO TABS
75.0000 mg | ORAL_TABLET | Freq: Every day | ORAL | Status: DC
  Administered 2014-07-16: 75 mg via ORAL
  Filled 2014-07-15: qty 1

## 2014-07-15 MED ORDER — CLOPIDOGREL BISULFATE 75 MG PO TABS: 75.0000 mg | ORAL_TABLET | Freq: Every day | ORAL | Status: DC

## 2014-07-15 MED ORDER — OXYCODONE HCL 5 MG PO TABS
5.0000 mg | ORAL_TABLET | ORAL | Status: DC | PRN
  Administered 2014-07-15 – 2014-07-16 (×3): 5 mg via ORAL
  Filled 2014-07-15 (×3): qty 1

## 2014-07-15 MED ORDER — ASPIRIN EC 325 MG PO TBEC
325.0000 mg | DELAYED_RELEASE_TABLET | Freq: Every day | ORAL | Status: DC
Start: 2014-07-15 — End: 2014-07-16
  Administered 2014-07-15 – 2014-07-16 (×2): 325 mg via ORAL
  Filled 2014-07-15 (×2): qty 1

## 2014-07-15 MED ORDER — OXYCODONE-ACETAMINOPHEN 10-325 MG PO TABS: 1.0000 | ORAL_TABLET | Freq: Four times a day (QID) | ORAL | Status: DC

## 2014-07-15 MED ORDER — PANTOPRAZOLE SODIUM 40 MG PO TBEC
40.0000 mg | DELAYED_RELEASE_TABLET | Freq: Every day | ORAL | Status: DC
  Administered 2014-07-15 – 2014-07-16 (×2): 40 mg via ORAL
  Filled 2014-07-15 (×2): qty 1

## 2014-07-15 MED ORDER — GADOBENATE DIMEGLUMINE 529 MG/ML IV SOLN: 20.0000 mL | Freq: Once | INTRAVENOUS | Status: AC | PRN

## 2014-07-15 MED ORDER — ATORVASTATIN CALCIUM 80 MG PO TABS
80.0000 mg | ORAL_TABLET | Freq: Every day | ORAL | Status: DC
Start: 2014-07-15 — End: 2014-07-16
  Administered 2014-07-15: 80 mg via ORAL
  Filled 2014-07-15 (×2): qty 1

## 2014-07-15 MED ORDER — HEPARIN SODIUM (PORCINE) 5000 UNIT/ML IJ SOLN
5000.0000 [IU] | Freq: Three times a day (TID) | INTRAMUSCULAR | Status: DC
  Administered 2014-07-15 – 2014-07-16 (×4): 5000 [IU] via SUBCUTANEOUS
  Filled 2014-07-15 (×4): qty 1

## 2014-07-15 MED ORDER — ACETAMINOPHEN 325 MG PO TABS
650.0000 mg | ORAL_TABLET | Freq: Four times a day (QID) | ORAL | Status: DC | PRN
  Administered 2014-07-15: 650 mg via ORAL
  Filled 2014-07-15: qty 2

## 2014-07-15 MED ORDER — ACETAMINOPHEN 650 MG RE SUPP: 650.0000 mg | Freq: Four times a day (QID) | RECTAL | Status: DC | PRN

## 2014-07-15 NOTE — ED Notes (Signed)
Pt presents with occipital H/A X 3 days. Pt has a history of stroke 8 or 9 years ago with left leg deficits. NIH - 0 pt states that this is how he felt when he had his last stroke.

## 2014-07-15 NOTE — H&P (Signed)
Date: 07/15/2014               Patient Name:  George Villa MRN: [id number]  DOB: 10/15/1965 Age / Sex: 49 y.o., male   PCP: Lahoma Crocker, MD         Medical Service: Internal Medicine Teaching Service         Attending Physician: Dr. Margarito Liner, MD    First Contact: Dr. Loma Newton Pager: [phone]  Second Contact: Dr. Johna Roles Pager: [phone]       After Hours (After 5p/  First Contact Pager: [phone]  weekends / holidays): Second Contact Pager: [phone]   Chief Complaint: Headache  History of Present Illness:   Patient is a 49 year old with a history of hypertension, coronary artery disease (status post PCI 2 drug-eluting stents), GERD, chronic low back pain who presents with an occipital headache lasting 3 days. He states that it is a dull pain that radiates to the front of his head. He states that it is around an 8 out of 10 in severity and that his home Percocets helps a little bit for his headache. Patient states that the only time he had a headache similar to this in the past was when he had his hemorrhagic cerebellar stroke in 2006, although he says that the severity of the pain this time is much less. Patient also reports associated right-sided blurry vision. He states that this symptom has resolved although his headache has not resolved. Otherwise, patient denies any language deficits, unilateral weakness, dysarthria, or facial droop. Patient states that he walks with a cane since the previous stroke in 2015 that was remarkable for left leg weakness.  Otherwise, patient denies any fevers, chills, chest pain, shortness of breath, vomiting, nausea, abdominal pain, constipation, diarrhea, dysuria, or hematuria.  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 Patient had another CVA in 2015. Patient also had a inferior STEMI at the end of 2015 as well.  Meds: Medications Prior to  Admission  Medication Sig Dispense Refill  . amLODipine (NORVASC) 5 MG tablet Take 1 tablet (5 mg total) by mouth daily. 30 tablet 5  . aspirin EC 325 MG EC tablet Take 1 tablet (325 mg total) by mouth daily. 30 tablet 11  . lisinopril (PRINIVIL,ZESTRIL) 20 MG tablet Take 20 mg by mouth daily.    Marland Kitchen omeprazole (PRILOSEC) 20 MG capsule Take 20 mg by mouth.    . oxyCODONE-acetaminophen (PERCOCET) 10-325 MG per tablet Take 1 tablet by mouth 4 (four) times daily.  0  . atorvastatin (LIPITOR) 80 MG tablet Take 1 tablet (80 mg total) by mouth daily at 6 PM. (Patient not taking: Reported on 07/15/2014) 30 tablet 5  . clopidogrel (PLAVIX) 75 MG tablet Take 1 tablet (75 mg total) by mouth daily with breakfast. (Patient not taking: Reported on 07/15/2014) 30 tablet 11  . metoprolol (LOPRESSOR) 50 MG tablet Take 1 tablet (50 mg total) by mouth 2 (two) times daily. (Patient not taking: Reported on 07/15/2014) 60 tablet 5  . nitroGLYCERIN (NITROSTAT) 0.4 MG SL tablet Place 1 tablet (0.4 mg total) under the tongue every 5 (five) minutes as needed for chest pain. (Patient not taking: Reported on 07/15/2014) 25 tablet 2  . oxyCODONE-acetaminophen (PERCOCET) 7.5-325 MG per tablet Take 1 tablet by mouth every 4 (four) hours as needed for pain. (Patient not taking: Reported on 07/15/2014) 15 tablet 0  . pantoprazole (PROTONIX) 40 MG tablet  Take 1 tablet (40 mg total) by mouth daily. (Patient not taking: Reported on 07/15/2014) 30 tablet 5   Current Facility-Administered Medications  Medication Dose Route Frequency Provider Last Rate Last Dose  . aspirin EC tablet 325 mg  325 mg Oral Daily Marjan Rabbani, MD   325 mg at 07/15/14 1541  . atorvastatin (LIPITOR) tablet 80 mg  80 mg Oral q1800 Marjan Rabbani, MD   80 mg at 07/15/14 1715  . [START ON 07/16/2014] clopidogrel (PLAVIX) tablet 75 mg  75 mg Oral Q breakfast Marjan Rabbani, MD      . gadobenate dimeglumine (MULTIHANCE) injection 20 mL  20 mL Intravenous Once PRN Medication  Radiologist, MD      . heparin injection 5,000 Units  5,000 Units Subcutaneous 3 times per day Otis Brace, MD   5,000 Units at 07/15/14 1541  . oxyCODONE-acetaminophen (PERCOCET/ROXICET) 5-325 MG per tablet 1 tablet  1 tablet Oral Q4H PRN Margarito Liner, MD   1 tablet at 07/15/14 1713   And  . oxyCODONE (Oxy IR/ROXICODONE) immediate release tablet 5 mg  5 mg Oral Q4H PRN Margarito Liner, MD   5 mg at 07/15/14 1713  . pantoprazole (PROTONIX) EC tablet 40 mg  40 mg Oral Daily Hanna Patel-Mills, PA-C   40 mg at 07/15/14 1155  . sodium chloride 0.9 % injection 3 mL  3 mL Intravenous Q12H Otis Brace, MD        Past Medical History  Diagnosis Date  . Hypertension   . 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  . Acid reflux   . Headache     "monthly" (02/18/2014)  . Stroke 2006    "left leg weak since" (02/18/2014)  . Chronic lower back pain   . 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/17/2014    [name] 02/17/2014    Past Surgical History  Procedure Laterality Date  . Back surgery    . Brain hematoma evacuation  2006  . Ventriculostomy  2006  . Lumbar disc surgery  2013  . Tracheostomy  05/2004  . Left heart catheterization with coronary angiogram N/A 02/17/2014    Procedure: LEFT HEART CATHETERIZATION WITH CORONARY ANGIOGRAM;  Surgeon: [name], MD;  Location: [facility] CATH LAB;  Service: Cardiovascular;  Laterality: N/A;  . Percutaneous coronary stent intervention (pci-s)  02/17/2014    Procedure: PERCUTANEOUS CORONARY STENT INTERVENTION (PCI-S);  Surgeon: [name], MD;  Location: [facility] CATH LAB;  Service: Cardiovascular;;     Allergies: Allergies as of 07/15/2014  . (No Known Allergies)    Family History  Problem Relation Age of Onset  . Stroke      History   Social History  . Marital Status: Single    Spouse Name: N/A  . Number of Children: N/A  . Years of Education: N/A   Occupational History  .      Works in [employer]     Social History Main Topics  . Smoking status: Never Smoker   . Smokeless tobacco: Never Used  . Alcohol Use: 3.6 oz/week    6 Cans of beer, 0 Standard drinks or equivalent per week  . Drug Use: No  . Sexual Activity: Yes   Other Topics Concern  . Not on file   Social History Narrative     Review of Systems: All pertinent ROS as stated in HPI.   Physical Exam: Blood pressure 134/89, pulse 88, temperature 98.2 F (36.8 C), temperature source  Oral, resp. rate 14, height 5\' 7"  (1.702 m), weight 195 lb (88.451 kg), SpO2 98 %. General: resting in bed HEENT: PERRL, EOMI, no scleral icterus Cardiac: RRR, no rubs, murmurs or gallops Pulm: clear to auscultation bilaterally, moving normal volumes of air Abd: soft, nontender, nondistended, BS present Ext: warm and well perfused, no pedal edema Neuro: alert and oriented X3  - cranial nerves II through XII intact  - motor: Strength 5+ throughout all 4 extremities  - Sensation: Intact to light touch throughout all 4 extremities  - Reflexes: 3+ deep tendon reflexes throughout left upper and lower extremity, 2+ deep tendon reflexes throughout right upper and lower extremity  - cerebellar: Finger to nose and heel to shin intact, negative Romberg sign  - Gait: Walks with a cane and favors the right lower extremity Skin: no rashes or lesions noted Psych: appropriate affect  Lab results: Basic Metabolic Panel:  Recent Labs  16/10/96 1030 07/15/14 1638  NA 139  --   K 3.6  --   CL 107  --   CO2 23  --   GLUCOSE 97  --   BUN 15  --   CREATININE 1.34  --   CALCIUM 8.8  --   MG  --  2.0  PHOS  --  2.6   Liver Function Tests:  Recent Labs  07/15/14 1638  AST 27  ALT 22  ALKPHOS 64  BILITOT 0.6  PROT 6.2  ALBUMIN 3.2*   No results for input(s): LIPASE, AMYLASE in the last 72 hours. No results for input(s): AMMONIA in the last 72 hours. CBC:  Recent Labs  07/15/14 1030  WBC 7.6  NEUTROABS 5.6  HGB 12.3*  HCT 40.2   MCV 73.5*  PLT 248   Cardiac Enzymes: No results for input(s): CKTOTAL, CKMB, CKMBINDEX, TROPONINI in the last 72 hours. BNP: No results for input(s): PROBNP in the last 72 hours. D-Dimer: No results for input(s): DDIMER in the last 72 hours. CBG: No results for input(s): GLUCAP in the last 72 hours. Hemoglobin A1C: No results for input(s): HGBA1C in the last 72 hours. Fasting Lipid Panel:  Recent Labs  07/15/14 1638  CHOL 199  HDL 38*  LDLCALC 145*  TRIG 81  CHOLHDL 5.2   Thyroid Function Tests:  Recent Labs  07/15/14 1638  TSH 0.911   Anemia Panel: No results for input(s): VITAMINB12, FOLATE, FERRITIN, TIBC, IRON, RETICCTPCT in the last 72 hours. Coagulation:  Recent Labs  07/15/14 1030  LABPROT 13.8  INR 1.05   Urine Drug Screen: Drugs of Abuse  No results found for: LABOPIA, COCAINSCRNUR, LABBENZ, AMPHETMU, THCU, LABBARB  Alcohol Level:  Recent Labs  07/15/14 1638  ETH <5   Urinalysis: No results for input(s): COLORURINE, LABSPEC, PHURINE, GLUCOSEU, HGBUR, BILIRUBINUR, KETONESUR, PROTEINUR, UROBILINOGEN, NITRITE, LEUKOCYTESUR in the last 72 hours.  Invalid input(s): APPERANCEUR  Imaging results:  Ct Head Wo Contrast  07/15/2014   CLINICAL DATA:  Occipital headache for 3 days.  Dizziness.  EXAM: CT HEAD WITHOUT CONTRAST  TECHNIQUE: Contiguous axial images were obtained from the base of the skull through the vertex without intravenous contrast.  COMPARISON:  Head CT scan 02/27/2007.  Head CT scan 02/28/2011.  FINDINGS: Right suboccipital craniectomy defect and encephalomalacia in the right cerebellum appear unchanged. Since the prior examination, the patient has suffered a small left occipital lobe infarct, age indeterminate. Scattered areas of hypoattenuation in deep white matter structures are identified as on the most recent comparison study. No  hemorrhage, midline shift or abnormal extra-axial fluid collection is identified. No hydrocephalus or  pneumocephalus. The calvarium is intact.  IMPRESSION: New, age indeterminate left PCA territory infarct.  Advanced for age chronic microvascular ischemic change.  Status post right suboccipital craniectomy with right cerebellar encephalomalacia, unchanged.   Electronically Signed   By: Drusilla Kannerhomas  Dalessio M.D.   On: 07/15/2014 10:30   Mr Angiogram Head Wo Contrast  07/15/2014   CLINICAL DATA:  Occipital headache for 3 days. Dizziness. Hypertension. RIGHT lower homonymous hemianopsia. Initial encounter.  EXAM: MRI HEAD WITHOUT AND WITH CONTRAST AND MRA HEAD WITHOUT AND WITH CONTRAST AND MRI NECK WITHOUT AND WITH CONTRAST  TECHNIQUE: Multiplanar, multiecho pulse sequences of the brain and surrounding structures were obtained without and with intravenous contrast. Angiographic images of the head were obtained using MRA technique without and with contrast. Multiplanar, multiecho pulse sequences of the neck and surrounding structures were obtained without and with intravenous contrast.  CONTRAST:  MultiHance 19 mL.  COMPARISON:  CT head earlier today.  FINDINGS: MRI HEAD FINDINGS  The patient was unable to remain motionless for the exam. Small or subtle lesions could be overlooked. Moderate-sized area of restricted diffusion LEFT occipital lobe correlates with the area of cytotoxic edema on CT representing acute infarction. This involves LEFT occipital pole and calcarine cortex. No other areas of diffusion restriction are observed.  Premature for age cerebral and cerebellar atrophy. Moderate T2 and FLAIR hyperintensities throughout the periventricular and subcortical white matter representing chronic microvascular ischemic change. Scattered areas of deep white matter lacunar infarction, chronic. Extensive encephalomalacia in the RIGHT cerebellar hemisphere related through previous hypertensive hemorrhage which required surgical evacuation. Ex vacuo enlargement fourth ventricle. Post infusion, mild intravascular  enhancement LEFT occipital lobe without dural or parenchymal enhancement. Extracranial soft tissues unremarkable. No midline abnormality.  MRA HEAD FINDINGS  Significant motion degradation decreases sensitivity.  Dominant LEFT internal carotid artery due to diseased or atretic RIGHT A1 ACA. Both anterior cerebrals distally fed from the LEFT.  There is irregularity involving the supraclinoid RIGHT ICA. It is unclear if there is a flow-limiting stenosis at this segment. 50-75% stenosis could be present based on the observed projection images.  No RIGHT or LEFT proximal middle cerebral artery M1 stenosis. No MCA branch occlusion. Mild irregularity distal MCA branches consistent with intracranial atherosclerotic change.  The basilar is thick-walled. The LEFT vertebral is the dominant contributor. 50-75% stenosis of the RIGHT vertebral distal V4 segment likely not significant. There is misregistration due to patient motion across the mid to distal basilar artery, but based on axial source images, concern is raised for a basilar dissection. There is an apparent 50-75% stenosis in the mid basilar. No definite cerebellar branch occlusion.  Fetal origin LEFT PCA. Moderately diseased P2 and P3 segments of the PCAs bilaterally. Abrupt occlusion of the LEFT PCA at the level of the calcarine branch origin.  MRI NECK FINDINGS  Conventional branching without visible proximal stenosis or arch abnormality. Mild non stenotic irregularity both carotid bifurcations. LEFT vertebral dominant without significant ostial stenosis.  IMPRESSION: The patient was unable to remain motionless for the exam. Small or subtle lesions could be overlooked.  Acute LEFT occipital lobe infarct affecting the occipital pole and calcarine cortex.  Moderately extensive chronic changes as described, including atrophy, small vessel disease, and sequelae of previous remote hypertensive bleed.  Fetal origin LEFT PCA directly from the internal carotid. Evidence  for significant intracranial atherosclerotic change both posterior cerebral arteries with probable distal thrombosis, although the  distal embolus is not excluded.  Motion degraded intracranial MRA demonstrating apparent abnormality of the mid to distal basilar which could represent a localized dissection; at a minimum, the basilar is thick-walled although this abnormality is not felt to be contributory to the acute stroke. 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  No extracranial flow reducing lesion is evident.   Electronically Signed   By: Davonna Belling M.D.   On: 07/15/2014 15:37   Mr Angiogram Neck W Wo Contrast  07/15/2014   CLINICAL DATA:  Occipital headache for 3 days. Dizziness. Hypertension. RIGHT lower homonymous hemianopsia. Initial encounter.  EXAM: MRI HEAD WITHOUT AND WITH CONTRAST AND MRA HEAD WITHOUT AND WITH CONTRAST AND MRI NECK WITHOUT AND WITH CONTRAST  TECHNIQUE: Multiplanar, multiecho pulse sequences of the brain and surrounding structures were obtained without and with intravenous contrast. Angiographic images of the head were obtained using MRA technique without and with contrast. Multiplanar, multiecho pulse sequences of the neck and surrounding structures were obtained without and with intravenous contrast.  CONTRAST:  MultiHance 19 mL.  COMPARISON:  CT head earlier today.  FINDINGS: MRI HEAD FINDINGS  The patient was unable to remain motionless for the exam. Small or subtle lesions could be overlooked. Moderate-sized area of restricted diffusion LEFT occipital lobe correlates with the area of cytotoxic edema on CT representing acute infarction. This involves LEFT occipital pole and calcarine cortex. No other areas of diffusion restriction are observed.  Premature for age cerebral and cerebellar atrophy. Moderate T2 and FLAIR hyperintensities throughout the periventricular and subcortical white matter representing chronic microvascular ischemic change. Scattered areas of deep white matter  lacunar infarction, chronic. Extensive encephalomalacia in the RIGHT cerebellar hemisphere related through previous hypertensive hemorrhage which required surgical evacuation. Ex vacuo enlargement fourth ventricle. Post infusion, mild intravascular enhancement LEFT occipital lobe without dural or parenchymal enhancement. Extracranial soft tissues unremarkable. No midline abnormality.  MRA HEAD FINDINGS  Significant motion degradation decreases sensitivity.  Dominant LEFT internal carotid artery due to diseased or atretic RIGHT A1 ACA. Both anterior cerebrals distally fed from the LEFT.  There is irregularity involving the supraclinoid RIGHT ICA. It is unclear if there is a flow-limiting stenosis at this segment. 50-75% stenosis could be present based on the observed projection images.  No RIGHT or LEFT proximal middle cerebral artery M1 stenosis. No MCA branch occlusion. Mild irregularity distal MCA branches consistent with intracranial atherosclerotic change.  The basilar is thick-walled. The LEFT vertebral is the dominant contributor. 50-75% stenosis of the RIGHT vertebral distal V4 segment likely not significant. There is misregistration due to patient motion across the mid to distal basilar artery, but based on axial source images, concern is raised for a basilar dissection. There is an apparent 50-75% stenosis in the mid basilar. No definite cerebellar branch occlusion.  Fetal origin LEFT PCA. Moderately diseased P2 and P3 segments of the PCAs bilaterally. Abrupt occlusion of the LEFT PCA at the level of the calcarine branch origin.  MRI NECK FINDINGS  Conventional branching without visible proximal stenosis or arch abnormality. Mild non stenotic irregularity both carotid bifurcations. LEFT vertebral dominant without significant ostial stenosis.  IMPRESSION: The patient was unable to remain motionless for the exam. Small or subtle lesions could be overlooked.  Acute LEFT occipital lobe infarct affecting the  occipital pole and calcarine cortex.  Moderately extensive chronic changes as described, including atrophy, small vessel disease, and sequelae of previous remote hypertensive bleed.  Fetal origin LEFT PCA directly from the internal carotid. Evidence for significant intracranial atherosclerotic  change both posterior cerebral arteries with probable distal thrombosis, although the distal embolus is not excluded.  Motion degraded intracranial MRA demonstrating apparent abnormality of the mid to distal basilar which could represent a localized dissection; at a minimum, the basilar is thick-walled although this abnormality is not felt to be contributory to the acute stroke. 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  No extracranial flow reducing lesion is evident.   Electronically Signed   By: Davonna Belling M.D.   On: 07/15/2014 15:37   Mr Brain Wo Contrast  07/15/2014   CLINICAL DATA:  Occipital headache for 3 days. Dizziness. Hypertension. RIGHT lower homonymous hemianopsia. Initial encounter.  EXAM: MRI HEAD WITHOUT AND WITH CONTRAST AND MRA HEAD WITHOUT AND WITH CONTRAST AND MRI NECK WITHOUT AND WITH CONTRAST  TECHNIQUE: Multiplanar, multiecho pulse sequences of the brain and surrounding structures were obtained without and with intravenous contrast. Angiographic images of the head were obtained using MRA technique without and with contrast. Multiplanar, multiecho pulse sequences of the neck and surrounding structures were obtained without and with intravenous contrast.  CONTRAST:  MultiHance 19 mL.  COMPARISON:  CT head earlier today.  FINDINGS: MRI HEAD FINDINGS  The patient was unable to remain motionless for the exam. Small or subtle lesions could be overlooked. Moderate-sized area of restricted diffusion LEFT occipital lobe correlates with the area of cytotoxic edema on CT representing acute infarction. This involves LEFT occipital pole and calcarine cortex. No other areas of diffusion restriction are observed.   Premature for age cerebral and cerebellar atrophy. Moderate T2 and FLAIR hyperintensities throughout the periventricular and subcortical white matter representing chronic microvascular ischemic change. Scattered areas of deep white matter lacunar infarction, chronic. Extensive encephalomalacia in the RIGHT cerebellar hemisphere related through previous hypertensive hemorrhage which required surgical evacuation. Ex vacuo enlargement fourth ventricle. Post infusion, mild intravascular enhancement LEFT occipital lobe without dural or parenchymal enhancement. Extracranial soft tissues unremarkable. No midline abnormality.  MRA HEAD FINDINGS  Significant motion degradation decreases sensitivity.  Dominant LEFT internal carotid artery due to diseased or atretic RIGHT A1 ACA. Both anterior cerebrals distally fed from the LEFT.  There is irregularity involving the supraclinoid RIGHT ICA. It is unclear if there is a flow-limiting stenosis at this segment. 50-75% stenosis could be present based on the observed projection images.  No RIGHT or LEFT proximal middle cerebral artery M1 stenosis. No MCA branch occlusion. Mild irregularity distal MCA branches consistent with intracranial atherosclerotic change.  The basilar is thick-walled. The LEFT vertebral is the dominant contributor. 50-75% stenosis of the RIGHT vertebral distal V4 segment likely not significant. There is misregistration due to patient motion across the mid to distal basilar artery, but based on axial source images, concern is raised for a basilar dissection. There is an apparent 50-75% stenosis in the mid basilar. No definite cerebellar branch occlusion.  Fetal origin LEFT PCA. Moderately diseased P2 and P3 segments of the PCAs bilaterally. Abrupt occlusion of the LEFT PCA at the level of the calcarine branch origin.  MRI NECK FINDINGS  Conventional branching without visible proximal stenosis or arch abnormality. Mild non stenotic irregularity both carotid  bifurcations. LEFT vertebral dominant without significant ostial stenosis.  IMPRESSION: The patient was unable to remain motionless for the exam. Small or subtle lesions could be overlooked.  Acute LEFT occipital lobe infarct affecting the occipital pole and calcarine cortex.  Moderately extensive chronic changes as described, including atrophy, small vessel disease, and sequelae of previous remote hypertensive bleed.  Fetal origin LEFT PCA directly  from the internal carotid. Evidence for significant intracranial atherosclerotic change both posterior cerebral arteries with probable distal thrombosis, although the distal embolus is not excluded.  Motion degraded intracranial MRA demonstrating apparent abnormality of the mid to distal basilar which could represent a localized dissection; at a minimum, the basilar is thick-walled although this abnormality is not felt to be contributory to the acute stroke. 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  No extracranial flow reducing lesion is evident.   Electronically Signed   By: Davonna Belling M.D.   On: 07/15/2014 15:37    Other results: EKG Interpretation  Date/Time:  Thursday July 15 2014 09:49:26 EDT Ventricular Rate:  77 PR Interval:  155 QRS Duration: 103 QT Interval:  441 QTC Calculation: 499 R Axis:   16 Text Interpretation:  Sinus rhythm Borderline prolonged QT interval Confirmed by Wilkie Aye  MD, COURTNEY ([id number]) on 07/15/2014 11:52:47 AM   Assessment & Plan by Problem: Active Problems:   CVA (cerebral infarction)   Acute ischemic left PCA stroke  Left PCA territory infact: Patient presenting with occipital headache associated with blurry vision on his right visual field. On current exam, blurry vision has normalized although headache is persistent. Physical exam unremarkable for any recent changes although left-sided hyperreflexia noted. CT and MRI showing evidence of left occipital pole and calcarine cortex infarction, which corresponds with  patient's reported visual deficit. Patient is out of the window for TPA and has a history of intracranial hemorrhage. Patient reporting a recent history of nonadherence to aspirin and Plavix therapy. -Resume aspirin 325 mg daily and Plavix 75 mg daily -Echocardiogram -Hold home antihypertensives in the setting of progressive hypertension -Telemetry monitoring -Frequent neuro checks  -PT/OT SLP -Appreciate neuro recommendations -Home Percocet for management of headache.  Microcytic anemia: Admission hemoglobin of 12.3 down from a normal range from 4 months ago. No record of a colonoscopy found in the record. He denies any change in color of the stool. -FOBT -Anemia panel  CAD: Inferior MI from November 2015. STEMI. S/p pic status post PCI of dRCA with 2 DES. Would question whether recent history of myocardial infarction may dispose patient to cardiac arrhythmias that could be contributory to embolic stroke. -Continue dual antiplatelet therapy as above. -Hold metoprolol and lisinopril in the setting of permissive hypertension.  GERD: No acute issues. -Continue home Protonix 40 mg daily  Chronic low back pain: No acute issues at this point. -Continue home Percocet in the setting of headache.  HTN: Patient mildly hypertensive at 155/93. At home, patient is on amlodipine 5 mg daily, lisinopril 20 mg daily, and metoprolol 50 mg twice a day. -Hold home antihypertensives in the setting of permissive hypertension.  Diet: Heart Prophylaxis: heparin subq Code: Full  Dispo: Disposition is deferred at this time, awaiting improvement of current medical problems. Anticipated discharge in approximately 3 day(s).   The patient does have a current PCP (Dittana Phoncharoensri, MD) and does need an [facility] hospital follow-up appointment after discharge.  The patient does not have transportation limitations that hinder transportation to clinic appointments.  Signed: Harold Barban, M.D., Ph.D. Internal  Medicine Teaching Service, PGY-1 07/15/2014, 7:17 PM

## 2014-07-15 NOTE — ED Notes (Signed)
Patient in MRI 

## 2014-07-15 NOTE — ED Provider Notes (Signed)
CSN: [id number]     Arrival date & time 07/15/14  8295 History   First MD Initiated Contact with Patient 07/15/14 [phone]     Chief Complaint  Patient presents with  . Headache     (Consider location/radiation/quality/duration/timing/severity/associated sxs/prior Treatment) HPI George Villa is a 49 y.o white male with a history of HTN, cerebellar hemorrhage and MI who presents with new onset occipital headache for the past 3 days.  He did have an episode of blurry vision yesterday but it resolved. In the past hour he states he is dizzy with blurred vision and weakness. He describes the dizziness as feeling off balance. Nothing makes it better or worse. He denies any fever, chest pain, shortness of breath, abdominal pain, vomiting, urinary symptoms, or leg swelling. He does take aspirin daily but has not taken one today.  Past Medical History  Diagnosis Date  . Hypertension   . 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  . Acid reflux   . Headache     "monthly" (02/18/2014)  . Stroke 2006    "left leg weak since" (02/18/2014)  . Chronic lower back pain   . 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 Back surgery    . Brain hematoma evacuation  2006  . Ventriculostomy  2006  . Lumbar disc surgery  2013  . Tracheostomy  05/2004  . Left heart catheterization with coronary angiogram N/A 02/17/2014    Procedure: LEFT HEART CATHETERIZATION WITH CORONARY ANGIOGRAM;  Surgeon: [name], MD;  Location: [facility] CATH LAB;  Service: Cardiovascular;  Laterality: N/A;  . Percutaneous coronary stent intervention (pci-s)  02/17/2014    Procedure: PERCUTANEOUS CORONARY STENT INTERVENTION (PCI-S);  Surgeon: [name], MD;  Location: [facility] CATH LAB;  Service: Cardiovascular;;   Family History  Problem Relation Age of Onset  . Stroke     History  Substance Use Topics  . Smoking  status: Never Smoker   . Smokeless tobacco: Never Used  . Alcohol Use: 3.6 oz/week    6 Cans of beer, 0 Standard drinks or equivalent per week    Review of Systems  Neurological: Negative for seizures, syncope and numbness.  All other systems reviewed and are negative.     Allergies  Review of patient's allergies indicates no known allergies.  Home Medications   Prior to Admission medications   Medication Sig Start Date End Date Taking? Authorizing Provider  amLODipine (NORVASC) 5 MG tablet Take 1 tablet (5 mg total) by mouth daily. 02/19/14  Yes Brittainy Sherlynn Carbon, PA-C  aspirin EC 325 MG EC tablet Take 1 tablet (325 mg total) by mouth daily. 02/19/14  Yes Brittainy Sherlynn Carbon, PA-C  lisinopril (PRINIVIL,ZESTRIL) 20 MG tablet Take 20 mg by mouth daily.   Yes Historical Provider, MD  omeprazole (PRILOSEC) 20 MG capsule Take 20 mg by mouth.   Yes Historical Provider, MD  oxyCODONE-acetaminophen (PERCOCET) 10-325 MG per tablet Take 1 tablet by mouth 4 (four) times daily. 01/26/14  Yes Historical Provider, MD  atorvastatin (LIPITOR) 80 MG tablet Take 1 tablet (80 mg total) by mouth daily at 6 PM. Patient not taking: Reported on 07/15/2014 02/19/14   Brittainy Sherlynn Carbon, PA-C  clopidogrel (PLAVIX) 75 MG tablet Take 1 tablet (75 mg total) by mouth daily with breakfast. Patient not taking: Reported on 07/15/2014 02/19/14   Brittainy Sherlynn Carbon, PA-C  metoprolol (LOPRESSOR) 50 MG tablet Take 1 tablet (50 mg total) by mouth 2 (two) times daily. Patient not taking: Reported on 07/15/2014 02/19/14   Brittainy M Sharol Harness, PA-C  nitroGLYCERIN (NITROSTAT) 0.4 MG SL tablet Place 1 tablet (0.4 mg total) under the tongue every 5 (five) minutes as needed for chest pain. Patient not taking: Reported on 07/15/2014 02/19/14   Brittainy M Sharol Harness, PA-C  oxyCODONE-acetaminophen (PERCOCET) 7.5-325 MG per tablet Take 1 tablet by mouth every 4 (four) hours as needed for pain. Patient not taking: Reported on 07/15/2014  05/20/13   Kathrynn Speed, PA-C  pantoprazole (PROTONIX) 40 MG tablet Take 1 tablet (40 mg total) by mouth daily. Patient not taking: Reported on 07/15/2014 02/19/14   Brittainy M Simmons, PA-C   BP 144/101 mmHg  Pulse 77  Temp(Src) 98 F (36.7 C) (Oral)  Resp 12  Ht  (1.702 m)  Wt 195 lb (88.451 kg)  BMI 30.53 kg/m2  SpO2 97% Physical Exam  Constitutional: He is oriented to person, place, and time. He appears well-developed and well-nourished.  HENT:  Head: Normocephalic and atraumatic.  Eyes: Conjunctivae and EOM are normal.  Neck: Normal range of motion. Neck supple.  Cardiovascular: Normal rate, regular rhythm and normal heart sounds.   Pulmonary/Chest: Effort normal and breath sounds normal.  Abdominal: Soft. There is no tenderness.  Musculoskeletal: Normal range of motion.  Neurological: He is alert and oriented to person, place, and time.  Cranial nerves III-XII in tact. No sensory or motor deficit.  Patient ambulates with cane. Normal finger to nose and normal heel to chin.    Skin: Skin is warm and dry.  Nursing note and vitals reviewed.   ED Course  Procedures (including critical care time) Labs Review Labs Reviewed  CBC WITH DIFFERENTIAL/PLATELET - Abnormal; Notable for the following:    Hemoglobin 12.3 (*)    MCV 73.5 (*)    MCH 22.5 (*)    RDW 15.6 (*)    All other components within normal limits  BASIC METABOLIC PANEL - Abnormal; Notable for the following:    GFR calc non Af Amer 60 (*)    GFR calc Af Amer 70 (*)    All other components within normal limits  PROTIME-INR    Imaging Review Ct Head Wo Contrast  07/15/2014   CLINICAL DATA:  Occipital headache for 3 days.  Dizziness.  EXAM: CT HEAD WITHOUT CONTRAST  TECHNIQUE: Contiguous axial images were obtained from the base of the skull through the vertex without intravenous contrast.  COMPARISON:  Head CT scan 02/27/2007.  Head CT scan 02/28/2011.  FINDINGS: Right suboccipital craniectomy defect and  encephalomalacia in the right cerebellum appear unchanged. Since the prior examination, the patient has suffered a small left occipital lobe infarct, age indeterminate. Scattered areas of hypoattenuation in deep white matter structures are identified as on the most recent comparison study. No hemorrhage, midline shift or abnormal extra-axial fluid collection is identified. No hydrocephalus or pneumocephalus. The calvarium is intact.  IMPRESSION: New, age indeterminate left PCA territory infarct.  Advanced for age chronic microvascular ischemic change.  Status post right suboccipital craniectomy with right cerebellar encephalomalacia, unchanged.   Electronically Signed   By: Drusilla Kanner M.D.   On: 07/15/2014 10:30     EKG Interpretation   Date/Time:  Thursday July 15 2014 09:49:26 EDT Ventricular Rate:  77 PR Interval:  155 QRS Duration: 103 QT Interval:  441 QTC Calculation: 499 R Axis:   16 Text Interpretation:  Sinus rhythm Borderline prolonged QT interval  Confirmed by HORTON  MD, COURTNEY ([id number]) on 07/15/2014 11:52:47 AM      MDM   Final diagnoses:  Acute left PCA stroke  Patient has a history of cerebellar hemorrhage and HTN and presents with 3 days of occipital headache.  Worse in the last hour with vision changes and dizziness. His neuro exam is normal.  He has no strength deficits although he reports residual left sided lower extremity weakness from previous stroke.  Normal heel to chin and finger to nose exam.  He does have a right lower peripheral field deficit. I have discussed this patient with Dr. Wilkie AyeHorton who has seen the patient.   On CT head he had a new left PCA territory infarct.   10:40 Dr. Wilkie AyeHorton has a call out to neuro for consult.  10:44 Dr. Wilkie AyeHorton spoke to Dr. Amada JupiterKirkpatrick.  He will see the patient in ED. No code stroke. Not a TPA candidate at this time. Ordered MR brain w/o contrast and MR neck with contrast.  Patient is stable. He is resting comfortably. Labs  are normal. Filed Vitals:   07/15/14 1154  BP:   Pulse:   Temp: 98 F (36.7 C)  Resp:    I have given him fluids, benadryl, and compazine for headache.  I spoke to hospitalist regarding admission.  He has been accepted to tele by Dr. Meredith PelJoines.    Catha GosselinHanna Patel-Mills, PA-C 07/15/14 1209  Shon Batonourtney F Horton, MD 07/15/14 [phone]

## 2014-07-15 NOTE — ED Notes (Signed)
Patient transported to CT 

## 2014-07-15 NOTE — ED Notes (Signed)
Attempted report 

## 2014-07-15 NOTE — ED Notes (Signed)
Patient remains in MRI, MRI given this RN's number to call when patient is ready to be transported to 4N.

## 2014-07-15 NOTE — Consult Note (Signed)
Neurology Consultation Reason for Consult: vision changes.  Referring Physician: Wilkie Aye, C  CC: Vision cahnges  History is obtained from:patient  HPI: George Villa is a 49 y.o. male who noticed some vision changes yesterday, but they were not severe. Today, he has complained of blurred vision worsening around 9am. He had a CT showing changes consistent with his deficit and neurology was consulted. He denies language change, weakness, numbness, or other new changes.   He does endorse posterior headache present for 2 days. He has been on ASA + plavix in the past for cardiac reasons, but is non-compliant.    LKW: yesterday tpa given?: no, out of window, previous ICH    ROS: A 14 point ROS was performed and is negative except as noted in the HPI.   Past Medical History  Diagnosis Date  . Hypertension   . 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  . Acid reflux   . Headache     "monthly" (02/18/2014)  . Stroke 2006    "left leg weak since" (02/18/2014)  . Chronic lower back pain   . 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/17/2014    [name] 02/17/2014    Family History: No histoyr of similar  Social History: Tob: denies  Exam: Current vital signs: BP 144/101 mmHg  Pulse 77  Temp(Src) 98 F (36.7 C) (Oral)  Resp 12  Ht  (1.702 m)  Wt 88.451 kg (195 lb)  BMI 30.53 kg/m2  SpO2 97% Vital signs in last 24 hours: Temp:  [98 F (36.7 C)-98.2 F (36.8 C)] 98 F (36.7 C) (04/07 1154) Pulse Rate:  [77-81] 77 (04/07 1145) Resp:  [12-14] 12 (04/07 1145) BP: (144-160)/(97-101) 144/101 mmHg (04/07 1145) SpO2:  [97 %-100 %] 97 % (04/07 1145) Weight:  [88.451 kg (195 lb)] 88.451 kg (195 lb) (04/07 0944)  Physical Exam  Constitutional: Appears well-developed and well-nourished.  Psych: Affect appropriate to situation Eyes: No scleral injection HENT: No OP obstrucion Head: Normocephalic.  Cardiovascular: Normal rate and regular  rhythm.  Respiratory: Effort normal and breath sounds normal to anterior ascultation GI: Soft.  No distension. There is no tenderness.  Skin: WDI  Neuro: Mental Status: Patient is awake, alert, oriented to person, place, month, year, and situation. Patient is able to give a clear and coherent history. No signs of aphasia or neglect Cranial Nerves: II: Right lower field cut. Pupils are equal, round, and reactive to light.   III,IV, VI: EOMI without ptosis or diploplia.  V: Facial sensation is symmetric to temperature VII: Facial movement is symmetric.  VIII: hearing is intact to voice X: Uvula elevates symmetrically XI: Shoulder shrug is symmetric. XII: tongue is midline without atrophy or fasciculations.  Motor: Tone is normal. Bulk is normal. 5/5 strength was present in all four extremities with the exception of mild weakness of the left leg.  Sensory: Sensation is symmetric to light touch and temperature in the arms and legs. Deep Tendon Reflexes: 3+ and symmetric in the patellae. 3+ left bicep, 2+ right bicep.  Cerebellar: FNF and HKS with dysmetria on right.      I have reviewed labs in epic and the results pertinent to this consultation are: Bmp unremarkable.   I have reviewed the images obtained:CT head - left occipital infarct.   Impression: 49 yo M with likely subacute occipital infarct. He has a history of ICH, but this is remote and he is supposed to be on antiplatelet therapy  but is non-compliant.   Recommendations: 1. HgbA1c, fasting lipid panel 2. MRI, MRA  of the brain without contrast, MRA neck with contrast.  3. Frequent neuro checks 4. Echocardiogram 5. Carotid dopplers are not needed given MRA neck 6. Prophylactic therapy-Antiplatelet med: Aspirin - dose 325mg  PO or 300mg  PR, no contraindication to dual AP therapy if needed from cardiac perspective 7. Risk factor modification 8. Telemetry monitoring 9. OT consult    Ritta SlotMcNeill Kirkpatrick, MD [facility] [phone]  If 7pm- 7am, please page neurology on call as listed in AMION.

## 2014-07-16 DIAGNOSIS — R9431 Abnormal electrocardiogram [ECG] [EKG]: Secondary | ICD-10-CM | POA: Diagnosis present

## 2014-07-16 DIAGNOSIS — E785 Hyperlipidemia, unspecified: Secondary | ICD-10-CM | POA: Diagnosis present

## 2014-07-16 DIAGNOSIS — I6789 Other cerebrovascular disease: Secondary | ICD-10-CM

## 2014-07-16 DIAGNOSIS — I639 Cerebral infarction, unspecified: Secondary | ICD-10-CM | POA: Diagnosis present

## 2014-07-16 DIAGNOSIS — D509 Iron deficiency anemia, unspecified: Secondary | ICD-10-CM | POA: Diagnosis present

## 2014-07-16 LAB — IRON AND TIBC
IRON: 25 ug/dL — AB (ref 42–165)
SATURATION RATIOS: 7 % — AB (ref 20–55)
TIBC: 339 ug/dL (ref 215–435)
UIBC: 314 ug/dL (ref 125–400)

## 2014-07-16 LAB — HIV ANTIBODY (ROUTINE TESTING W REFLEX): HIV SCREEN 4TH GENERATION: NONREACTIVE

## 2014-07-16 LAB — APTT: APTT: 31 s (ref 24–37)

## 2014-07-16 LAB — TECHNOLOGIST SMEAR REVIEW

## 2014-07-16 LAB — VITAMIN B12: Vitamin B-12: 604 pg/mL (ref 211–911)

## 2014-07-16 LAB — FOLATE

## 2014-07-16 LAB — FERRITIN: FERRITIN: 15 ng/mL — AB (ref 22–322)

## 2014-07-16 MED ORDER — FERROUS SULFATE 325 (65 FE) MG PO TABS: 325.0000 mg | ORAL_TABLET | Freq: Three times a day (TID) | ORAL | Status: DC

## 2014-07-16 MED ORDER — FERROUS SULFATE 325 (65 FE) MG PO TABS
325.0000 mg | ORAL_TABLET | Freq: Three times a day (TID) | ORAL | Status: DC
  Administered 2014-07-16 (×3): 325 mg via ORAL
  Filled 2014-07-16 (×3): qty 1

## 2014-07-16 MED ORDER — GADOBENATE DIMEGLUMINE 529 MG/ML IV SOLN
20.0000 mL | Freq: Once | INTRAVENOUS | Status: AC
  Administered 2014-07-16: 17 mL via INTRAVENOUS

## 2014-07-16 NOTE — Progress Notes (Signed)
STROKE TEAM PROGRESS NOTE   HISTORY George Villa is a 49 y.o. male who noticed some vision changes yesterday, but they were not severe. Today, he has complained of blurred vision worsening around 9am. He had a CT showing changes consistent with his deficit and neurology was consulted. He denies language change, weakness, numbness, or other new changes.   He does endorse posterior headache present for 2 days. He has been on ASA + plavix in the past for cardiac reasons, but is non-compliant.    LKW: yesterday tpa given?: no, out of window, previous ICH    SUBJECTIVE (INTERVAL HISTORY) No family members present today. The patient feels that he is back to baseline. On exam Dr. Rico Ala noted a right visual field deficit. He instructed the patient that he should not drive until this condition had improved. He will see the patient back in the office in approximately 2 months. Okay to proceed with discharge from stroke team standpoint.   OBJECTIVE Temp:  [97.9 F (36.6 C)-98.8 F (37.1 C)] 97.9 F (36.6 C) (04/08 0525) Pulse Rate:  [67-90] 67 (04/08 0525) Cardiac Rhythm:  [-] Normal sinus rhythm (04/07 1932) Resp:  [12-16] 14 (04/08 0525) BP: (115-160)/(71-109) 126/80 mmHg (04/08 0525) SpO2:  [96 %-100 %] 96 % (04/08 0525) Weight:  [86 kg (189 lb 9.5 oz)-88.451 kg (195 lb)] 86 kg (189 lb 9.5 oz) (04/08 0500)  No results for input(s): GLUCAP in the last 168 hours.  Recent Labs Lab 07/15/14 1030 07/15/14 1638  NA 139  --   K 3.6  --   CL 107  --   CO2 23  --   GLUCOSE 97  --   BUN 15  --   CREATININE 1.34  --   CALCIUM 8.8  --   MG  --  2.0  PHOS  --  2.6    Recent Labs Lab 07/15/14 1638  AST 27  ALT 22  ALKPHOS 64  BILITOT 0.6  PROT 6.2  ALBUMIN 3.2*    Recent Labs Lab 07/15/14 1030  WBC 7.6  NEUTROABS 5.6  HGB 12.3*  HCT 40.2  MCV 73.5*  PLT 248   No results for input(s): CKTOTAL, CKMB, CKMBINDEX, TROPONINI in the last 168 hours.  Recent Labs   07/15/14 1030  LABPROT 13.8  INR 1.05   No results for input(s): COLORURINE, LABSPEC, PHURINE, GLUCOSEU, HGBUR, BILIRUBINUR, KETONESUR, PROTEINUR, UROBILINOGEN, NITRITE, LEUKOCYTESUR in the last 72 hours.  Invalid input(s): APPERANCEUR     Component Value Date/Time   CHOL 199 07/15/2014 1638   TRIG 81 07/15/2014 1638   HDL 38* 07/15/2014 1638   CHOLHDL 5.2 07/15/2014 1638   VLDL 16 07/15/2014 1638   LDLCALC 145* 07/15/2014 1638   Lab Results  Component Value Date   HGBA1C 5.6 02/17/2014      Component Value Date/Time   LABOPIA NONE DETECTED 07/15/2014 2100   COCAINSCRNUR NONE DETECTED 07/15/2014 2100   LABBENZ NONE DETECTED 07/15/2014 2100   AMPHETMU NONE DETECTED 07/15/2014 2100   THCU NONE DETECTED 07/15/2014 2100   LABBARB NONE DETECTED 07/15/2014 2100     Recent Labs Lab 07/15/14 1638  ETH <5    Ct Head Wo Contrast 07/15/2014    New, age indeterminate left PCA territory infarct.  Advanced for age chronic microvascular ischemic change.  Status post right suboccipital craniectomy with right cerebellar encephalomalacia, unchanged.       Mr Angiogram Head and Neck Wo Contrast 07/15/2014    The patient was  unable to remain motionless for the exam. Small or subtle lesions could be overlooked.   Acute LEFT occipital lobe infarct affecting the occipital pole and calcarine cortex.   Moderately extensive chronic changes as described, including atrophy, small vessel disease, and sequelae of previous remote hypertensive bleed.   Fetal origin LEFT PCA directly from the internal carotid.  Evidence for significant intracranial atherosclerotic change both posterior cerebral arteries with probable distal thrombosis, although the distal embolus is not excluded.   Motion degraded intracranial MRA demonstrating apparent abnormality of the mid to distal basilar which could represent a localized dissection; at a minimum, the basilar is thick-walled although this abnormality is not felt to  be contributory to the acute stroke. 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   No extracranial flow reducing lesion is evident.        PHYSICAL EXAM  pleasant middle-aged male not in distress. . Afebrile. Head is nontraumatic. Neck is supple without bruit.    Cardiac exam no murmur or gallop. Lungs are clear to auscultation. Distal pulses are well felt.  Neurological Exam : Awake alert oriented 3 with normal speech and language function. No aphasia, dysarthria or apraxia. Extraocular movements are full range without nystagmus. Right partial numbness hemianopsia to bedside confrontational testing. Fundi were not visualized. Face is symmetric without weakness. Tongue is midline. Cough and gag are good. Motor system exam revealed no upper or lower extremity drift. Symmetric and equal strength in all 4 extremities. Deep tendon reflexes are 1+ symmetric. Plantars are downgoing. Sensation is intact coordination is normal. Gait was not tested.    ASSESSMENT/PLAN [name] is a 49 y.o. 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  He did not receive IV t-PA due to late presentation and previous cerebellar hemorrhage.  Stroke:  Dominant - left occipital lobe infarct secondary to atherosclerosis.  Resultant - right visual field deficit  MRI - Acute LEFT occipital lobe infarct affecting the occipital pole and calcarine cortex.    MRA - Suspected midbasilar 50-75% stenosis.    Carotid Doppler - see MRA of the neck  2D Echo - EF 55-60%. No cardiac source of emboli identified.  LDL - 145  HgbA1c pending  Subcutaneous heparin for VTE prophylaxis  Diet Heart Room service appropriate?: Yes; Fluid consistency:: Thin  aspirin 325 mg orally every day and clopidogrel 75 mg  orally every day prior to admission, now on aspirin 325 mg orally every day and clopidogrel 75 mg orally every day  Patient counseled to be compliant with his antithrombotic medications  Ongoing aggressive stroke risk factor management  Therapy recommendations:  Outpatient physical therapy recommended  Disposition:  Pending - okay for discharge today from stroke team standpoint  Hypertension  Home meds: Norvasc, lisinopril, metoprolol  Stable - Permissive hypertension <220/120 for 24-48 hours and then gradually normalize within 5-7 days  Patient counseled to be compliant with his blood pressure medications  Hyperlipidemia  Home meds:  Lipitor 80 mg daily resumed in hospital  LDL 145, goal < 70  Verify compliance  Continue statin at discharge    Other Stroke Risk Factors  Obesity, Body mass index is 29.69 kg/(m^2).   Hx stroke/TIA  Coronary artery disease   Other Active Problems  Right visual field deficits - the patient is not to drive until cleared by Dr. Pearlean Brownie.  Mild anemia    Other Pertinent History   PLAN  Continue aspirin and Plavix therapy  Address elevated LDL - goal is less than 70 - verify compliance with Lipitor  Outpatient physical therapy recommended  Okay for discharge today from stroke team standpoint  No need for TEE or Loop recorder.  History of noncompliance with medications  No driving until cleared by Dr Pearlean BrownieSethi  Follow-up Dr. Pearlean BrownieSethi in 2 months  Hospital day # 1  Delton SeeDavid Rinehuls PA-C [facility] Pager [phone] 07/16/2014, 1:15 PM  I have personally examined this patient, reviewed notes, independently viewed imaging studies, participated in medical decision making and plan of care. I have made any additions or clarifications directly to the above note. Agree with note above. He has presented with vision difficulties secondary to left posterior cerebral artery branch infarct etiology likely next from intracranial  atherosclerosis as well as small vessel disease. I do not believe he needs workup for embolic source for TEE. He remains at risk for recurrent stroke/TIA neurological worsening. Continue ongoing stroke evaluation. Aggressive antiplatelet therapy with aspirin 81 and Plavix 75 g daily for 3 months followed by Plavix alone. Aggressive control of hypertension, lipids and sugars. I have advised him not to drive till his peripheral vision improves. Kindly call for questions.  Delia HeadyPramod Sethi, MD Medical Director [facility] Pager: [phone] 07/16/2014 1:52 PM    To contact Stroke Continuity provider, please refer to [email or web address]. After hours, contact General Neurology

## 2014-07-16 NOTE — Progress Notes (Signed)
Discharge orders received. Pt educated on d/c instructions and stroke education. Pt verbalized understanding. Pt handed d/c packet. IV and tele removed. Pt taken downstairs by staff via wheelchair.

## 2014-07-16 NOTE — Discharge Instructions (Signed)
You have had a stroke that may have affected your condition and cause your headache. Please follow-up with neurology as scheduled. Please follow-up in our internal medicine clinic ([facility]   Your found to have some low blood counts in the hospital and we recommend that you get a screening colonoscopy at some point. Your provider will discuss this further with you at your follow-up hospital appointment.  Your provider will also refer you back to your cardiologist for further management of your medicines after your stent placement last year.  Please do not drive any vehicles over the next 2 months because of your visual deficits. Please wait until your follow-up appointment with neurology before considering driving again.
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
## 2014-07-16 NOTE — Progress Notes (Signed)
UR complete.  Tammie Yanda RN, MSN 

## 2014-07-16 NOTE — Progress Notes (Signed)
Talked to patient about Outpatient rehab choices, [facility]; orders/ clinical information faxed to the rehab center. [facility] will contact the patient for a start up date and time for his therapyAlexis Goodell; B Burna Atlas RN,BSN,MHA [phone]

## 2014-07-16 NOTE — Discharge Summary (Signed)
Name: George Villa MRN: [id number] DOB: [birth year] 49 y.o. PCP: Formerly Lahoma Crocker, MD was to transition care to a provider [facility].  Date of Admission: 07/15/2014  9:34 AM Date of Discharge: 07/16/2014 Attending Physician: Margarito Liner, MD  Discharge Diagnosis: Principal Problem:   Acute ischemic left PCA stroke Active Problems:   History of ST elevation myocardial infarction (STEMI)   Essential hypertension   History of cerebellar hemorrhage 2006   GERD (gastroesophageal reflux disease)   Obesity   CAD S/P PCI of dRCA - 2 DES (2.25 x 28 Promus Premier & 2.75 x 28 Promus Premier -- postdilated to 2.75 mm) - distal RCA branches small & diffusely diseased.   Hyperlipidemia   Iron deficiency anemia   Prolonged Q-T interval on ECG   CVA (cerebral infarction)  Discharge Medications:   Medication List    STOP taking these medications        amLODipine 5 MG tablet  Commonly known as:  NORVASC     lisinopril 20 MG tablet  Commonly known as:  PRINIVIL,ZESTRIL     metoprolol 50 MG tablet  Commonly known as:  LOPRESSOR     pantoprazole 40 MG tablet  Commonly known as:  PROTONIX      TAKE these medications        aspirin 325 MG EC tablet  Take 1 tablet (325 mg total) by mouth daily.     atorvastatin 80 MG tablet  Commonly known as:  LIPITOR  Take 1 tablet (80 mg total) by mouth daily at 6 PM.     clopidogrel 75 MG tablet  Commonly known as:  PLAVIX  Take 1 tablet (75 mg total) by mouth daily with breakfast.     ferrous sulfate 325 (65 FE) MG tablet  Take 1 tablet (325 mg total) by mouth 3 (three) times daily with meals.     nitroGLYCERIN 0.4 MG SL tablet  Commonly known as:  NITROSTAT  Place 1 tablet (0.4 mg total) under the tongue every 5 (five) minutes as needed for chest pain.     omeprazole 20 MG capsule  Commonly known as:  PRILOSEC  Take 20 mg by mouth.     oxyCODONE-acetaminophen 7.5-325 MG per tablet  Commonly  known as:  PERCOCET  Take 1 tablet by mouth every 4 (four) hours as needed for pain.        Disposition and follow-up:   [facility].  At the hospital follow up visit please address:  Left PCA territory infact: Neurology noted a persistent right-sided visual field deficit and advised the patient to avoid driving until a follow-up appointment in 2 months from discharge. Patient will need a referral for outpatient physical therapy.   HTN: Would recommend resumption  of antihypertensives as appropriate at hospital follow-up.  Iron deficiency anemia: Patient will require a screening colonoscopy as an outpatient.  CAD: Recommend that patient follow with cardiology as an outpatient after his hospital follow-up. A follow-up appointment was not set up at the time of discharge due to to concern for a history of not following up with specialists.  2.  Labs / imaging needed at time of follow-up: none  3.  Pending labs/ test needing follow-up: none  Follow-up Appointments:     Follow-up Information    Follow up with [facility].   Specialty:  Rehabilitation   Why:  they will call you  for a start up date and time for your therapy   Contact information:   [address] [id number] [place] [id number] [phone]      Follow up with Annett GulaMcLean, Tracy N, MD.   Specialty:  Internal Medicine   Why:  07/26/14 3:45 pm   Contact information:   [address] [place] [id number] [phone]       Discharge Instructions: Discharge Instructions    Ambulatory referral to Neurology    Complete by:  As directed   Dr. Pearlean BrownieSethi requests follow up for this patient in 2 months.     Call MD for:  difficulty breathing, headache or visual disturbances    Complete by:  As directed      Call MD for:  extreme fatigue    Complete by:  As directed      Call MD for:   hives    Complete by:  As directed      Call MD for:  persistant dizziness or light-headedness    Complete by:  As directed      Call MD for:  persistant nausea and vomiting    Complete by:  As directed      Call MD for:  redness, tenderness, or signs of infection (pain, swelling, redness, odor or green/yellow discharge around incision site)    Complete by:  As directed      Call MD for:  severe uncontrolled pain    Complete by:  As directed      Call MD for:  temperature >100.4    Complete by:  As directed      Diet - low sodium heart healthy    Complete by:  As directed      Increase activity slowly    Complete by:  As directed            Consultations: Treatment Team:  Md Stroke, MD  Procedures Performed:  Ct Head Wo Contrast  07/15/2014   CLINICAL DATA:  Occipital headache for 3 days.  Dizziness.  EXAM: CT HEAD WITHOUT CONTRAST  TECHNIQUE: Contiguous axial images were obtained from the base of the skull through the vertex without intravenous contrast.  COMPARISON:  Head CT scan 02/27/2007.  Head CT scan 02/28/2011.  FINDINGS: Right suboccipital craniectomy defect and encephalomalacia in the right cerebellum appear unchanged. Since the prior examination, the patient has suffered a small left occipital lobe infarct, age indeterminate. Scattered areas of hypoattenuation in deep white matter structures are identified as on the most recent comparison study. No hemorrhage, midline shift or abnormal extra-axial fluid collection is identified. No hydrocephalus or pneumocephalus. The calvarium is intact.  IMPRESSION: New, age indeterminate left PCA territory infarct.  Advanced for age chronic microvascular ischemic change.  Status post right suboccipital craniectomy with right cerebellar encephalomalacia, unchanged.   Electronically Signed   By: Drusilla Kannerhomas  Dalessio M.D.   On: 07/15/2014 10:30   Mr Angiogram Head Wo Contrast  07/15/2014   CLINICAL DATA:  Occipital headache for 3 days. Dizziness.  Hypertension. RIGHT lower homonymous hemianopsia. Initial encounter.  EXAM: MRI HEAD WITHOUT AND WITH CONTRAST AND MRA HEAD WITHOUT AND WITH CONTRAST AND MRI NECK WITHOUT AND WITH CONTRAST  TECHNIQUE: Multiplanar, multiecho pulse sequences of the brain and surrounding structures were obtained without and with intravenous contrast. Angiographic images of the head were obtained using MRA technique without and with contrast. Multiplanar, multiecho pulse sequences of the neck and surrounding structures were obtained without and with intravenous  contrast.  CONTRAST:  MultiHance 19 mL.  COMPARISON:  CT head earlier today.  FINDINGS: MRI HEAD FINDINGS  The patient was unable to remain motionless for the exam. Small or subtle lesions could be overlooked. Moderate-sized area of restricted diffusion LEFT occipital lobe correlates with the area of cytotoxic edema on CT representing acute infarction. This involves LEFT occipital pole and calcarine cortex. No other areas of diffusion restriction are observed.  Premature for age cerebral and cerebellar atrophy. Moderate T2 and FLAIR hyperintensities throughout the periventricular and subcortical white matter representing chronic microvascular ischemic change. Scattered areas of deep white matter lacunar infarction, chronic. Extensive encephalomalacia in the RIGHT cerebellar hemisphere related through previous hypertensive hemorrhage which required surgical evacuation. Ex vacuo enlargement fourth ventricle. Post infusion, mild intravascular enhancement LEFT occipital lobe without dural or parenchymal enhancement. Extracranial soft tissues unremarkable. No midline abnormality.  MRA HEAD FINDINGS  Significant motion degradation decreases sensitivity.  Dominant LEFT internal carotid artery due to diseased or atretic RIGHT A1 ACA. Both anterior cerebrals distally fed from the LEFT.  There is irregularity involving the supraclinoid RIGHT ICA. It is unclear if there is a flow-limiting  stenosis at this segment. 50-75% stenosis could be present based on the observed projection images.  No RIGHT or LEFT proximal middle cerebral artery M1 stenosis. No MCA branch occlusion. Mild irregularity distal MCA branches consistent with intracranial atherosclerotic change.  The basilar is thick-walled. The LEFT vertebral is the dominant contributor. 50-75% stenosis of the RIGHT vertebral distal V4 segment likely not significant. There is misregistration due to patient motion across the mid to distal basilar artery, but based on axial source images, concern is raised for a basilar dissection. There is an apparent 50-75% stenosis in the mid basilar. No definite cerebellar branch occlusion.  Fetal origin LEFT PCA. Moderately diseased P2 and P3 segments of the PCAs bilaterally. Abrupt occlusion of the LEFT PCA at the level of the calcarine branch origin.  MRI NECK FINDINGS  Conventional branching without visible proximal stenosis or arch abnormality. Mild non stenotic irregularity both carotid bifurcations. LEFT vertebral dominant without significant ostial stenosis.  IMPRESSION: The patient was unable to remain motionless for the exam. Small or subtle lesions could be overlooked.  Acute LEFT occipital lobe infarct affecting the occipital pole and calcarine cortex.  Moderately extensive chronic changes as described, including atrophy, small vessel disease, and sequelae of previous remote hypertensive bleed.  Fetal origin LEFT PCA directly from the internal carotid. Evidence for significant intracranial atherosclerotic change both posterior cerebral arteries with probable distal thrombosis, although the distal embolus is not excluded.  Motion degraded intracranial MRA demonstrating apparent abnormality of the mid to distal basilar which could represent a localized dissection; at a minimum, the basilar is thick-walled although this abnormality is not felt to be contributory to the acute stroke. Suspected midbasilar  50-75% stenosis.  No extracranial flow reducing lesion is evident.   Electronically Signed   By: Davonna Belling M.D.   On: 07/15/2014 15:37   Mr Angiogram Neck W Wo Contrast  07/15/2014   CLINICAL DATA:  Occipital headache for 3 days. Dizziness. Hypertension. RIGHT lower homonymous hemianopsia. Initial encounter.  EXAM: MRI HEAD WITHOUT AND WITH CONTRAST AND MRA HEAD WITHOUT AND WITH CONTRAST AND MRI NECK WITHOUT AND WITH CONTRAST  TECHNIQUE: Multiplanar, multiecho pulse sequences of the brain and surrounding structures were obtained without and with intravenous contrast. Angiographic images of the head were obtained using MRA technique without and with contrast. Multiplanar, multiecho pulse sequences of  the neck and surrounding structures were obtained without and with intravenous contrast.  CONTRAST:  MultiHance 19 mL.  COMPARISON:  CT head earlier today.  FINDINGS: MRI HEAD FINDINGS  The patient was unable to remain motionless for the exam. Small or subtle lesions could be overlooked. Moderate-sized area of restricted diffusion LEFT occipital lobe correlates with the area of cytotoxic edema on CT representing acute infarction. This involves LEFT occipital pole and calcarine cortex. No other areas of diffusion restriction are observed.  Premature for age cerebral and cerebellar atrophy. Moderate T2 and FLAIR hyperintensities throughout the periventricular and subcortical white matter representing chronic microvascular ischemic change. Scattered areas of deep white matter lacunar infarction, chronic. Extensive encephalomalacia in the RIGHT cerebellar hemisphere related through previous hypertensive hemorrhage which required surgical evacuation. Ex vacuo enlargement fourth ventricle. Post infusion, mild intravascular enhancement LEFT occipital lobe without dural or parenchymal enhancement. Extracranial soft tissues unremarkable. No midline abnormality.  MRA HEAD FINDINGS  Significant motion degradation decreases  sensitivity.  Dominant LEFT internal carotid artery due to diseased or atretic RIGHT A1 ACA. Both anterior cerebrals distally fed from the LEFT.  There is irregularity involving the supraclinoid RIGHT ICA. It is unclear if there is a flow-limiting stenosis at this segment. 50-75% stenosis could be present based on the observed projection images.  No RIGHT or LEFT proximal middle cerebral artery M1 stenosis. No MCA branch occlusion. Mild irregularity distal MCA branches consistent with intracranial atherosclerotic change.  The basilar is thick-walled. The LEFT vertebral is the dominant contributor. 50-75% stenosis of the RIGHT vertebral distal V4 segment likely not significant. There is misregistration due to patient motion across the mid to distal basilar artery, but based on axial source images, concern is raised for a basilar dissection. There is an apparent 50-75% stenosis in the mid basilar. No definite cerebellar branch occlusion.  Fetal origin LEFT PCA. Moderately diseased P2 and P3 segments of the PCAs bilaterally. Abrupt occlusion of the LEFT PCA at the level of the calcarine branch origin.  MRI NECK FINDINGS  Conventional branching without visible proximal stenosis or arch abnormality. Mild non stenotic irregularity both carotid bifurcations. LEFT vertebral dominant without significant ostial stenosis.  IMPRESSION: The patient was unable to remain motionless for the exam. Small or subtle lesions could be overlooked.  Acute LEFT occipital lobe infarct affecting the occipital pole and calcarine cortex.  Moderately extensive chronic changes as described, including atrophy, small vessel disease, and sequelae of previous remote hypertensive bleed.  Fetal origin LEFT PCA directly from the internal carotid. Evidence for significant intracranial atherosclerotic change both posterior cerebral arteries with probable distal thrombosis, although the distal embolus is not excluded.  Motion degraded intracranial MRA  demonstrating apparent abnormality of the mid to distal basilar which could represent a localized dissection; at a minimum, the basilar is thick-walled although this abnormality is not felt to be contributory to the acute stroke. 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  No extracranial flow reducing lesion is evident.   Electronically Signed   By: Davonna Belling M.D.   On: 07/15/2014 15:37   Mr Brain Wo Contrast  07/15/2014   CLINICAL DATA:  Occipital headache for 3 days. Dizziness. Hypertension. RIGHT lower homonymous hemianopsia. Initial encounter.  EXAM: MRI HEAD WITHOUT AND WITH CONTRAST AND MRA HEAD WITHOUT AND WITH CONTRAST AND MRI NECK WITHOUT AND WITH CONTRAST  TECHNIQUE: Multiplanar, multiecho pulse sequences of the brain and surrounding structures were obtained without and with intravenous contrast. Angiographic images of the head were obtained using MRA technique  without and with contrast. Multiplanar, multiecho pulse sequences of the neck and surrounding structures were obtained without and with intravenous contrast.  CONTRAST:  MultiHance 19 mL.  COMPARISON:  CT head earlier today.  FINDINGS: MRI HEAD FINDINGS  The patient was unable to remain motionless for the exam. Small or subtle lesions could be overlooked. Moderate-sized area of restricted diffusion LEFT occipital lobe correlates with the area of cytotoxic edema on CT representing acute infarction. This involves LEFT occipital pole and calcarine cortex. No other areas of diffusion restriction are observed.  Premature for age cerebral and cerebellar atrophy. Moderate T2 and FLAIR hyperintensities throughout the periventricular and subcortical white matter representing chronic microvascular ischemic change. Scattered areas of deep white matter lacunar infarction, chronic. Extensive encephalomalacia in the RIGHT cerebellar hemisphere related through previous hypertensive hemorrhage which required surgical evacuation. Ex vacuo enlargement fourth  ventricle. Post infusion, mild intravascular enhancement LEFT occipital lobe without dural or parenchymal enhancement. Extracranial soft tissues unremarkable. No midline abnormality.  MRA HEAD FINDINGS  Significant motion degradation decreases sensitivity.  Dominant LEFT internal carotid artery due to diseased or atretic RIGHT A1 ACA. Both anterior cerebrals distally fed from the LEFT.  There is irregularity involving the supraclinoid RIGHT ICA. It is unclear if there is a flow-limiting stenosis at this segment. 50-75% stenosis could be present based on the observed projection images.  No RIGHT or LEFT proximal middle cerebral artery M1 stenosis. No MCA branch occlusion. Mild irregularity distal MCA branches consistent with intracranial atherosclerotic change.  The basilar is thick-walled. The LEFT vertebral is the dominant contributor. 50-75% stenosis of the RIGHT vertebral distal V4 segment likely not significant. There is misregistration due to patient motion across the mid to distal basilar artery, but based on axial source images, concern is raised for a basilar dissection. There is an apparent 50-75% stenosis in the mid basilar. No definite cerebellar branch occlusion.  Fetal origin LEFT PCA. Moderately diseased P2 and P3 segments of the PCAs bilaterally. Abrupt occlusion of the LEFT PCA at the level of the calcarine branch origin.  MRI NECK FINDINGS  Conventional branching without visible proximal stenosis or arch abnormality. Mild non stenotic irregularity both carotid bifurcations. LEFT vertebral dominant without significant ostial stenosis.  IMPRESSION: The patient was unable to remain motionless for the exam. Small or subtle lesions could be overlooked.  Acute LEFT occipital lobe infarct affecting the occipital pole and calcarine cortex.  Moderately extensive chronic changes as described, including atrophy, small vessel disease, and sequelae of previous remote hypertensive bleed.  Fetal origin LEFT PCA  directly from the internal carotid. Evidence for significant intracranial atherosclerotic change both posterior cerebral arteries with probable distal thrombosis, although the distal embolus is not excluded.  Motion degraded intracranial MRA demonstrating apparent abnormality of the mid to distal basilar which could represent a localized dissection; at a minimum, the basilar is thick-walled although this abnormality is not felt to be contributory to the acute stroke. 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  No extracranial flow reducing lesion is evident.   Electronically Signed   By: Davonna Belling M.D.   On: 07/15/2014 15:37    2D Echo:   Study Conclusions  - Left ventricle: The cavity size was normal. Wall thickness was increased in a pattern of mild LVH. Systolic function was normal. The estimated ejection fraction was in the range of 55% to 60%. Wall motion was normal; there were no regional wall motion abnormalities. Doppler parameters are consistent with abnormal left ventricular relaxation (grade 1 diastolic dysfunction). -  Aortic valve: There was trivial regurgitation. - Mitral valve: Calcified annulus.  Impressions:  - Normal LV function; grade 1 diastolic dysfunction; trace AI and MR.  Cardiac Cath: none  Admission HPI:   Patient is a 49 year old with a history of hypertension, coronary artery disease (status post PCI 2 drug-eluting stents), GERD, chronic low back pain who presents with an occipital headache lasting 3 days. He states that it is a dull pain that radiates to the front of his head. He states that it is around an 8 out of 10 in severity and that his home Percocets helps a little bit for his headache. Patient states that the only time he had a headache similar to this in the past was when he had his hemorrhagic cerebellar stroke in 2006, although he says that the severity of the pain this time is much less. Patient also reports associated right-sided blurry  vision. He states that this symptom has resolved although his headache has not resolved. Otherwise, patient denies any language deficits, unilateral weakness, dysarthria, or facial droop. Patient states that he walks with a cane since the previous stroke in 2015 that was remarkable for left leg weakness.  Otherwise, patient denies any fevers, chills, chest pain, shortness of breath, vomiting, nausea, abdominal pain, constipation, diarrhea, dysuria, or hematuria.  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 Patient had another CVA in 2015. Patient also had a inferior STEMI at the end of 2015 as well.  Hospital Course by problem list: Principal Problem:   Acute ischemic left PCA stroke Active Problems:   History of ST elevation myocardial infarction (STEMI)   Essential hypertension   History of cerebellar hemorrhage 2006   GERD (gastroesophageal reflux disease)   Obesity   CAD S/P PCI of dRCA - 2 DES (2.25 x 28 Promus Premier & 2.75 x 28 Promus Premier -- postdilated to 2.75 mm) - distal RCA branches small & diffusely diseased.   Hyperlipidemia   Iron deficiency anemia   Prolonged Q-T interval on ECG   CVA (cerebral infarction)   Left PCA territory infact: Patient initially presenting with an occipital headache associated with blurry vision in his right visual field. A concurrent left-sided hyperreflexia was also noted. CT and MRI showed evidence of a left occipital pole and calcarine cortex infarction which corresponded with the patient's reported history of a visual deficit. Patient was outside of the window for TPA and also had a history of intracranial hemorrhage in 2006. By the day of discharge, patient reports that his headache had improved and that his visual deficits had also improved. However, neurology noted a persistent right-sided visual field deficit and advised the patient to avoid  driving until a follow-up appointment in 2 months from discharge. It was thought that patient had an ischemic stroke secondary to intracranial atherosclerosis as opposed to an embolic stroke by neurology. There is no indication at this point to continue with a TEE or loop recorder given low suspicion for embolic source. Additional risk factors include noncompliance with aspirin, Plavix, and Lipitor. Patient's LDL was elevated at 145. There is no evidence of an embolic source of stroke from transthoracic echocardiogram. Patient was discharged on his home dosage of aspirin 325 mg daily, Plavix 75 mg daily, and Lipitor 80 mg daily. Patient was counseled on the importance of continuing with these medications and trying to prevent a repeat stroke. Patient will need a referral for outpatient physical therapy.  HTN: Patient's blood pressures remained well controlled in the 120s over 80s off of his home medications. Patient does report that he has some medication noncompliance at home. He does report that he takes lisinopril 20 mg daily but does not take the other medications. Upon discharge, all of his home antihypertensives were held in the setting of normotension and permissive hypertension in the post stroke period. Would recommend resumption of antihypertensives at hospital follow-up.  Iron deficiency anemia: Patient was found to have a new microcytic anemia associated with low iron and ferritin consistent with iron deficiency anemia. Ferrous sulfate 325 mg 3 times a day was started. Patient will require a screening colonoscopy as an outpatient.  CAD: No acute issues during this hospitalization. Patient is status post inferior STEMI from November 2015 status post PCI of the dRCA with DES x 2. Patient's home metoprolol and lisinopril were held in the setting of permissive hypertension. Patient has noted that he has not been to a cardiology follow-up recently. Recommend that patient follow with cardiology as an  outpatient after his hospital follow-up. A follow-up appointment was not set up at the time of discharge is a concern for a history of not following up with specialists.  GERD: No acute issues. Patient was continued on his home Protonix 40 mg daily.  Chronic low back pain: Patient was continued on his home Percocet in the setting of headache. No acute issues during this hospitalization.  Discharge Vitals:   BP 141/85 mmHg  Pulse 89  Temp(Src) 97.5 F (36.4 C) (Oral)  Resp 16  Ht 5\' 7"  (1.702 m)  Wt 189 lb 9.5 oz (86 kg)  BMI 29.69 kg/m2  SpO2 98%  Discharge Labs:  Results for orders placed or performed during the hospital encounter of 07/15/14 (from the past 24 hour(s))  Hepatic function panel     Status: Abnormal   Collection Time: 07/15/14  4:38 PM  Result Value Ref Range   Total Protein 6.2 6.0 - 8.3 g/dL   Albumin 3.2 (L) 3.5 - 5.2 g/dL   AST 27 0 - 37 U/L   ALT 22 0 - 53 U/L   Alkaline Phosphatase 64 39 - 117 U/L   Total Bilirubin 0.6 0.3 - 1.2 mg/dL   Bilirubin, Direct 0.2 0.0 - 0.5 mg/dL   Indirect Bilirubin 0.4 0.3 - 0.9 mg/dL  Magnesium     Status: None   Collection Time: 07/15/14  4:38 PM  Result Value Ref Range   Magnesium 2.0 1.5 - 2.5 mg/dL  Phosphorus     Status: None   Collection Time: 07/15/14  4:38 PM  Result Value Ref Range   Phosphorus 2.6 2.3 - 4.6 mg/dL  TSH     Status: None   Collection Time: 07/15/14  4:38 PM  Result Value Ref Range   TSH 0.911 0.350 - 4.500 uIU/mL  Lipid panel     Status: Abnormal   Collection Time: 07/15/14  4:38 PM  Result Value Ref Range   Cholesterol 199 0 - 200 mg/dL   Triglycerides 81 <161 mg/dL   HDL 38 (L) >09 mg/dL   Total CHOL/HDL Ratio 5.2 RATIO   VLDL 16 0 - 40 mg/dL   LDL Cholesterol 604 (H) 0 - 99 mg/dL  Ethanol     Status: None   Collection Time: 07/15/14  4:38 PM  Result Value Ref Range   Alcohol, Ethyl (B) <5 0 - 9 mg/dL  Iron and TIBC     Status: Abnormal  Collection Time: 07/15/14  8:21 PM  Result  Value Ref Range   Iron 25 (L) 42 - 165 ug/dL   TIBC 161 096 - 045 ug/dL   Saturation Ratios 7 (L) 20 - 55 %   UIBC 314 125 - 400 ug/dL  Folate     Status: None   Collection Time: 07/15/14  8:21 PM  Result Value Ref Range   Folate >20.0 ng/mL  Ferritin     Status: Abnormal   Collection Time: 07/15/14  8:21 PM  Result Value Ref Range   Ferritin 15 (L) 22 - 322 ng/mL  Reticulocytes     Status: None   Collection Time: 07/15/14  8:21 PM  Result Value Ref Range   Retic Ct Pct 1.2 0.4 - 3.1 %   RBC. 5.21 4.22 - 5.81 MIL/uL   Retic Count, Manual 62.5 19.0 - 186.0 K/uL  Urine rapid drug screen (hosp performed)     Status: None   Collection Time: 07/15/14  9:00 PM  Result Value Ref Range   Opiates NONE DETECTED NONE DETECTED   Cocaine NONE DETECTED NONE DETECTED   Benzodiazepines NONE DETECTED NONE DETECTED   Amphetamines NONE DETECTED NONE DETECTED   Tetrahydrocannabinol NONE DETECTED NONE DETECTED   Barbiturates NONE DETECTED NONE DETECTED  HIV antibody     Status: None   Collection Time: 07/16/14  5:18 AM  Result Value Ref Range   HIV Screen 4th Generation wRfx Non Reactive Non Reactive  Technologist smear review     Status: None   Collection Time: 07/16/14  5:18 AM  Result Value Ref Range   Tech Review RARE ATYPICAL LYMPHOCYTE SEEN.   APTT     Status: None   Collection Time: 07/16/14  5:18 AM  Result Value Ref Range   aPTT 31 24 - 37 seconds   Signed: Harold Barban, MD 07/16/2014, 4:02 PM   Services Ordered on Discharge: none Equipment Ordered on Discharge: none

## 2014-07-16 NOTE — Progress Notes (Signed)
  Echocardiogram 2D Echocardiogram has been performed.  George Villa FRANCES 07/16/2014, 10:20 AM

## 2014-07-16 NOTE — Progress Notes (Signed)
Subjective:  Patient states that he is feeling better this morning. He states that the severity of his headache is now down to 3 out of 10 from 8 out of 10 yesterday. He does not report any changes in his vision.  Objective: Vital signs in last 24 hours: Filed Vitals:   07/16/14 0500 07/16/14 0525 07/16/14 0855 07/16/14 1300  BP:  126/80 121/79 141/85  Pulse:  67 80 89  Temp:  97.9 F (36.6 C) 98.1 F (36.7 C) 97.5 F (36.4 C)  TempSrc:  Oral Oral Oral  Resp:  14 16 16   Height:      Weight: 189 lb 9.5 oz (86 kg)     SpO2:  96% 100% 98%   Weight change:   Intake/Output Summary (Last 24 hours) at 07/16/14 1322 Last data filed at 07/15/14 2050  Gross per 24 hour  Intake      0 ml  Output    100 ml  Net   -100 ml   General: resting in bed HEENT: PERRL, EOMI, no scleral icterus Cardiac: RRR, no rubs, murmurs or gallops Pulm: clear to auscultation bilaterally, moving normal volumes of air Abd: soft, nontender, nondistended, BS present Ext: warm and well perfused, no pedal edema Neuro: alert and oriented X3 - cranial nerves II through XII intact - motor: Strength 5+ throughout all 4 extremities - Sensation: Intact to light touch throughout all 4 extremities - Reflexes: 3+ deep tendon reflexes throughout left upper and lower extremity, 2+ deep tendon reflexes throughout right upper and lower extremity Skin: no rashes or lesions noted Psych: appropriate affect  Lab Results: Basic Metabolic Panel:  Recent Labs Lab 07/15/14 1030 07/15/14 1638  NA 139  --   K 3.6  --   CL 107  --   CO2 23  --   GLUCOSE 97  --   BUN 15  --   CREATININE 1.34  --   CALCIUM 8.8  --   MG  --  2.0  PHOS  --  2.6   Liver Function Tests:  Recent Labs Lab 07/15/14 1638  AST 27  ALT 22  ALKPHOS 64  BILITOT 0.6  PROT 6.2  ALBUMIN 3.2*   No results for input(s): LIPASE, AMYLASE in the last 168 hours. No results for input(s): AMMONIA in the last 168 hours. CBC:  Recent  Labs Lab 07/15/14 1030  WBC 7.6  NEUTROABS 5.6  HGB 12.3*  HCT 40.2  MCV 73.5*  PLT 248   Cardiac Enzymes: No results for input(s): CKTOTAL, CKMB, CKMBINDEX, TROPONINI in the last 168 hours. BNP: No results for input(s): PROBNP in the last 168 hours. D-Dimer: No results for input(s): DDIMER in the last 168 hours. CBG: No results for input(s): GLUCAP in the last 168 hours. Hemoglobin A1C: No results for input(s): HGBA1C in the last 168 hours. Fasting Lipid Panel:  Recent Labs Lab 07/15/14 1638  CHOL 199  HDL 38*  LDLCALC 145*  TRIG 81  CHOLHDL 5.2   Thyroid Function Tests:  Recent Labs Lab 07/15/14 1638  TSH 0.911   Coagulation:  Recent Labs Lab 07/15/14 1030  LABPROT 13.8  INR 1.05   Anemia Panel:  Recent Labs Lab 07/15/14 2021  FOLATE >20.0  FERRITIN 15*  TIBC 339  IRON 25*  RETICCTPCT 1.2   Urine Drug Screen: Drugs of Abuse     Component Value Date/Time   LABOPIA NONE DETECTED 07/15/2014 2100   COCAINSCRNUR NONE DETECTED 07/15/2014 2100   LABBENZ  NONE DETECTED 07/15/2014 2100   AMPHETMU NONE DETECTED 07/15/2014 2100   THCU NONE DETECTED 07/15/2014 2100   LABBARB NONE DETECTED 07/15/2014 2100    Alcohol Level:  Recent Labs Lab 07/15/14 1638  ETH <5   Urinalysis: No results for input(s): COLORURINE, LABSPEC, PHURINE, GLUCOSEU, HGBUR, BILIRUBINUR, KETONESUR, PROTEINUR, UROBILINOGEN, NITRITE, LEUKOCYTESUR in the last 168 hours.  Invalid input(s): APPERANCEUR Micro Results: Recent Results (from the past 240 hour(s))  MRSA PCR Screening     Status: None   Collection Time: 07/15/14  2:44 PM  Result Value Ref Range Status   MRSA by PCR NEGATIVE NEGATIVE Final    Comment:        The GeneXpert MRSA Assay (FDA approved for NASAL specimens only), is one component of a comprehensive MRSA colonization surveillance program. It is not intended to diagnose MRSA infection nor to guide or monitor treatment for MRSA infections.     Studies/Results: Ct Head Wo Contrast  07/15/2014   CLINICAL DATA:  Occipital headache for 3 days.  Dizziness.  EXAM: CT HEAD WITHOUT CONTRAST  TECHNIQUE: Contiguous axial images were obtained from the base of the skull through the vertex without intravenous contrast.  COMPARISON:  Head CT scan 02/27/2007.  Head CT scan 02/28/2011.  FINDINGS: Right suboccipital craniectomy defect and encephalomalacia in the right cerebellum appear unchanged. Since the prior examination, the patient has suffered a small left occipital lobe infarct, age indeterminate. Scattered areas of hypoattenuation in deep white matter structures are identified as on the most recent comparison study. No hemorrhage, midline shift or abnormal extra-axial fluid collection is identified. No hydrocephalus or pneumocephalus. The calvarium is intact.  IMPRESSION: New, age indeterminate left PCA territory infarct.  Advanced for age chronic microvascular ischemic change.  Status post right suboccipital craniectomy with right cerebellar encephalomalacia, unchanged.   Electronically Signed   By: Drusilla Kanner M.D.   On: 07/15/2014 10:30   Mr Angiogram Head Wo Contrast  07/15/2014   CLINICAL DATA:  Occipital headache for 3 days. Dizziness. Hypertension. RIGHT lower homonymous hemianopsia. Initial encounter.  EXAM: MRI HEAD WITHOUT AND WITH CONTRAST AND MRA HEAD WITHOUT AND WITH CONTRAST AND MRI NECK WITHOUT AND WITH CONTRAST  TECHNIQUE: Multiplanar, multiecho pulse sequences of the brain and surrounding structures were obtained without and with intravenous contrast. Angiographic images of the head were obtained using MRA technique without and with contrast. Multiplanar, multiecho pulse sequences of the neck and surrounding structures were obtained without and with intravenous contrast.  CONTRAST:  MultiHance 19 mL.  COMPARISON:  CT head earlier today.  FINDINGS: MRI HEAD FINDINGS  The patient was unable to remain motionless for the exam. Small or  subtle lesions could be overlooked. Moderate-sized area of restricted diffusion LEFT occipital lobe correlates with the area of cytotoxic edema on CT representing acute infarction. This involves LEFT occipital pole and calcarine cortex. No other areas of diffusion restriction are observed.  Premature for age cerebral and cerebellar atrophy. Moderate T2 and FLAIR hyperintensities throughout the periventricular and subcortical white matter representing chronic microvascular ischemic change. Scattered areas of deep white matter lacunar infarction, chronic. Extensive encephalomalacia in the RIGHT cerebellar hemisphere related through previous hypertensive hemorrhage which required surgical evacuation. Ex vacuo enlargement fourth ventricle. Post infusion, mild intravascular enhancement LEFT occipital lobe without dural or parenchymal enhancement. Extracranial soft tissues unremarkable. No midline abnormality.  MRA HEAD FINDINGS  Significant motion degradation decreases sensitivity.  Dominant LEFT internal carotid artery due to diseased or atretic RIGHT A1 ACA. Both anterior cerebrals  distally fed from the LEFT.  There is irregularity involving the supraclinoid RIGHT ICA. It is unclear if there is a flow-limiting stenosis at this segment. 50-75% stenosis could be present based on the observed projection images.  No RIGHT or LEFT proximal middle cerebral artery M1 stenosis. No MCA branch occlusion. Mild irregularity distal MCA branches consistent with intracranial atherosclerotic change.  The basilar is thick-walled. The LEFT vertebral is the dominant contributor. 50-75% stenosis of the RIGHT vertebral distal V4 segment likely not significant. There is misregistration due to patient motion across the mid to distal basilar artery, but based on axial source images, concern is raised for a basilar dissection. There is an apparent 50-75% stenosis in the mid basilar. No definite cerebellar branch occlusion.  Fetal origin LEFT  PCA. Moderately diseased P2 and P3 segments of the PCAs bilaterally. Abrupt occlusion of the LEFT PCA at the level of the calcarine branch origin.  MRI NECK FINDINGS  Conventional branching without visible proximal stenosis or arch abnormality. Mild non stenotic irregularity both carotid bifurcations. LEFT vertebral dominant without significant ostial stenosis.  IMPRESSION: The patient was unable to remain motionless for the exam. Small or subtle lesions could be overlooked.  Acute LEFT occipital lobe infarct affecting the occipital pole and calcarine cortex.  Moderately extensive chronic changes as described, including atrophy, small vessel disease, and sequelae of previous remote hypertensive bleed.  Fetal origin LEFT PCA directly from the internal carotid. Evidence for significant intracranial atherosclerotic change both posterior cerebral arteries with probable distal thrombosis, although the distal embolus is not excluded.  Motion degraded intracranial MRA demonstrating apparent abnormality of the mid to distal basilar which could represent a localized dissection; at a minimum, the basilar is thick-walled although this abnormality is not felt to be contributory to the acute stroke. 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  No extracranial flow reducing lesion is evident.   Electronically Signed   By: Davonna Belling M.D.   On: 07/15/2014 15:37   Mr Angiogram Neck W Wo Contrast  07/15/2014   CLINICAL DATA:  Occipital headache for 3 days. Dizziness. Hypertension. RIGHT lower homonymous hemianopsia. Initial encounter.  EXAM: MRI HEAD WITHOUT AND WITH CONTRAST AND MRA HEAD WITHOUT AND WITH CONTRAST AND MRI NECK WITHOUT AND WITH CONTRAST  TECHNIQUE: Multiplanar, multiecho pulse sequences of the brain and surrounding structures were obtained without and with intravenous contrast. Angiographic images of the head were obtained using MRA technique without and with contrast. Multiplanar, multiecho pulse sequences of the neck  and surrounding structures were obtained without and with intravenous contrast.  CONTRAST:  MultiHance 19 mL.  COMPARISON:  CT head earlier today.  FINDINGS: MRI HEAD FINDINGS  The patient was unable to remain motionless for the exam. Small or subtle lesions could be overlooked. Moderate-sized area of restricted diffusion LEFT occipital lobe correlates with the area of cytotoxic edema on CT representing acute infarction. This involves LEFT occipital pole and calcarine cortex. No other areas of diffusion restriction are observed.  Premature for age cerebral and cerebellar atrophy. Moderate T2 and FLAIR hyperintensities throughout the periventricular and subcortical white matter representing chronic microvascular ischemic change. Scattered areas of deep white matter lacunar infarction, chronic. Extensive encephalomalacia in the RIGHT cerebellar hemisphere related through previous hypertensive hemorrhage which required surgical evacuation. Ex vacuo enlargement fourth ventricle. Post infusion, mild intravascular enhancement LEFT occipital lobe without dural or parenchymal enhancement. Extracranial soft tissues unremarkable. No midline abnormality.  MRA HEAD FINDINGS  Significant motion degradation decreases sensitivity.  Dominant LEFT internal carotid artery  due to diseased or atretic RIGHT A1 ACA. Both anterior cerebrals distally fed from the LEFT.  There is irregularity involving the supraclinoid RIGHT ICA. It is unclear if there is a flow-limiting stenosis at this segment. 50-75% stenosis could be present based on the observed projection images.  No RIGHT or LEFT proximal middle cerebral artery M1 stenosis. No MCA branch occlusion. Mild irregularity distal MCA branches consistent with intracranial atherosclerotic change.  The basilar is thick-walled. The LEFT vertebral is the dominant contributor. 50-75% stenosis of the RIGHT vertebral distal V4 segment likely not significant. There is misregistration due to patient  motion across the mid to distal basilar artery, but based on axial source images, concern is raised for a basilar dissection. There is an apparent 50-75% stenosis in the mid basilar. No definite cerebellar branch occlusion.  Fetal origin LEFT PCA. Moderately diseased P2 and P3 segments of the PCAs bilaterally. Abrupt occlusion of the LEFT PCA at the level of the calcarine branch origin.  MRI NECK FINDINGS  Conventional branching without visible proximal stenosis or arch abnormality. Mild non stenotic irregularity both carotid bifurcations. LEFT vertebral dominant without significant ostial stenosis.  IMPRESSION: The patient was unable to remain motionless for the exam. Small or subtle lesions could be overlooked.  Acute LEFT occipital lobe infarct affecting the occipital pole and calcarine cortex.  Moderately extensive chronic changes as described, including atrophy, small vessel disease, and sequelae of previous remote hypertensive bleed.  Fetal origin LEFT PCA directly from the internal carotid. Evidence for significant intracranial atherosclerotic change both posterior cerebral arteries with probable distal thrombosis, although the distal embolus is not excluded.  Motion degraded intracranial MRA demonstrating apparent abnormality of the mid to distal basilar which could represent a localized dissection; at a minimum, the basilar is thick-walled although this abnormality is not felt to be contributory to the acute stroke. 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  No extracranial flow reducing lesion is evident.   Electronically Signed   By: Davonna Belling M.D.   On: 07/15/2014 15:37   Mr Brain Wo Contrast  07/15/2014   CLINICAL DATA:  Occipital headache for 3 days. Dizziness. Hypertension. RIGHT lower homonymous hemianopsia. Initial encounter.  EXAM: MRI HEAD WITHOUT AND WITH CONTRAST AND MRA HEAD WITHOUT AND WITH CONTRAST AND MRI NECK WITHOUT AND WITH CONTRAST  TECHNIQUE: Multiplanar, multiecho pulse sequences of  the brain and surrounding structures were obtained without and with intravenous contrast. Angiographic images of the head were obtained using MRA technique without and with contrast. Multiplanar, multiecho pulse sequences of the neck and surrounding structures were obtained without and with intravenous contrast.  CONTRAST:  MultiHance 19 mL.  COMPARISON:  CT head earlier today.  FINDINGS: MRI HEAD FINDINGS  The patient was unable to remain motionless for the exam. Small or subtle lesions could be overlooked. Moderate-sized area of restricted diffusion LEFT occipital lobe correlates with the area of cytotoxic edema on CT representing acute infarction. This involves LEFT occipital pole and calcarine cortex. No other areas of diffusion restriction are observed.  Premature for age cerebral and cerebellar atrophy. Moderate T2 and FLAIR hyperintensities throughout the periventricular and subcortical white matter representing chronic microvascular ischemic change. Scattered areas of deep white matter lacunar infarction, chronic. Extensive encephalomalacia in the RIGHT cerebellar hemisphere related through previous hypertensive hemorrhage which required surgical evacuation. Ex vacuo enlargement fourth ventricle. Post infusion, mild intravascular enhancement LEFT occipital lobe without dural or parenchymal enhancement. Extracranial soft tissues unremarkable. No midline abnormality.  MRA HEAD FINDINGS  Significant motion  degradation decreases sensitivity.  Dominant LEFT internal carotid artery due to diseased or atretic RIGHT A1 ACA. Both anterior cerebrals distally fed from the LEFT.  There is irregularity involving the supraclinoid RIGHT ICA. It is unclear if there is a flow-limiting stenosis at this segment. 50-75% stenosis could be present based on the observed projection images.  No RIGHT or LEFT proximal middle cerebral artery M1 stenosis. No MCA branch occlusion. Mild irregularity distal MCA branches consistent with  intracranial atherosclerotic change.  The basilar is thick-walled. The LEFT vertebral is the dominant contributor. 50-75% stenosis of the RIGHT vertebral distal V4 segment likely not significant. There is misregistration due to patient motion across the mid to distal basilar artery, but based on axial source images, concern is raised for a basilar dissection. There is an apparent 50-75% stenosis in the mid basilar. No definite cerebellar branch occlusion.  Fetal origin LEFT PCA. Moderately diseased P2 and P3 segments of the PCAs bilaterally. Abrupt occlusion of the LEFT PCA at the level of the calcarine branch origin.  MRI NECK FINDINGS  Conventional branching without visible proximal stenosis or arch abnormality. Mild non stenotic irregularity both carotid bifurcations. LEFT vertebral dominant without significant ostial stenosis.  IMPRESSION: The patient was unable to remain motionless for the exam. Small or subtle lesions could be overlooked.  Acute LEFT occipital lobe infarct affecting the occipital pole and calcarine cortex.  Moderately extensive chronic changes as described, including atrophy, small vessel disease, and sequelae of previous remote hypertensive bleed.  Fetal origin LEFT PCA directly from the internal carotid. Evidence for significant intracranial atherosclerotic change both posterior cerebral arteries with probable distal thrombosis, although the distal embolus is not excluded.  Motion degraded intracranial MRA demonstrating apparent abnormality of the mid to distal basilar which could represent a localized dissection; at a minimum, the basilar is thick-walled although this abnormality is not felt to be contributory to the acute stroke. 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  No extracranial flow reducing lesion is evident.   Electronically Signed   By: Davonna Belling M.D.   On: 07/15/2014 15:37   Medications: I have reviewed the patient's current medications. Scheduled Meds: . aspirin EC  325  mg Oral Daily  . atorvastatin  80 mg Oral q1800  . clopidogrel  75 mg Oral Q breakfast  . ferrous sulfate  325 mg Oral TID WC  . heparin  5,000 Units Subcutaneous 3 times per day  . pantoprazole  40 mg Oral Daily  . sodium chloride  3 mL Intravenous Q12H   Continuous Infusions:  PRN Meds:.oxyCODONE-acetaminophen **AND** oxyCODONE Assessment/Plan: Principal Problem:   Acute ischemic left PCA stroke Active Problems:   History of ST elevation myocardial infarction (STEMI)   Essential hypertension   History of cerebellar hemorrhage 2006   GERD (gastroesophageal reflux disease)   Obesity   CAD S/P PCI of dRCA - 2 DES (2.25 x 28 Promus Premier & 2.75 x 28 Promus Premier -- postdilated to 2.75 mm) - distal RCA branches small & diffusely diseased.   Hyperlipidemia   Iron deficiency anemia   Prolonged Q-T interval on ECG  Left PCA territory infact: Patient with improved headache and reports improved vision. Neurology noting a persistent right-sided visual field deficit. They are also favoring primary atherosclerosis rather than embolic events as etiology for stroke. Risk factors include noncompliance with aspirin, Plavix, and Lipitor. LDL persistently elevated at 145. No evidence of embolic source from transthoracic echocardiogram. -Appreciate neurology recommendations. -Patient to be discharged on aspirin 325 mg daily  and Plavix 75 mg daily -Lipitor 80 mg daily -Plan to resume home antihypertensives upon discharge. -Home Percocet for management of headache. -Patient is not to drive until follow-up with neurology in 2 months. -No need for TEE or loop recorder -Outpatient physical therapy  Iron deficiency anemia: Patient with a low iron and low ferritin in the setting of a microcytic anemia. -Ferrous sulfate 325 mg 3 times a day started. -FOBT pending -Patient will require a screening colonoscopy as an outpatient.  CAD: Inferior MI from November 2015. STEMI. S/p pic status post PCI of  dRCA with 2 DES. -Continue dual antiplatelet therapy as above. -Hold metoprolol and lisinopril in the setting of permissive hypertension. -Patient to follow-up with cardiology as an outpatient.  GERD: No acute issues. -Continue home Protonix 40 mg daily  Chronic low back pain: No acute issues at this point. -Continue home Percocet in the setting of headache.  HTN:  Blood pressures have been well controlled off of home medications. At home, patient is on amlodipine 5 mg daily, lisinopril 20 mg daily, and metoprolol 50 mg twice a day. -Hold home antihypertensives in the setting of permissive hypertension.  Diet: Heart Prophylaxis: heparin subq Code: Full  Dispo: Disposition is deferred at this time, awaiting improvement of current medical problems.  Anticipated discharge in approximately 0 day(s).   The patient does have a current PCP (Dittana Phoncharoensri, MD) and [facility].  The patient does not have transportation limitations that hinder transportation to clinic appointments.  .Services Needed at time of discharge: Y = Yes, Blank = No PT:   OT:   RN:   Equipment:   Other:     LOS: 1 day   Harold BarbanLawrence Keyondra Lagrand, MD 07/16/2014, 1:22 PM

## 2014-07-16 NOTE — Evaluation (Signed)
Physical Therapy Evaluation and Discharge Patient Details Name: George Villa MRN: [id number] DOB: [birth year] Today's Date: 07/16/2014   History of Present Illness  49 y.o. male admitted for vision changes with CT demon strating left occipital infarct. Hx of Cerebellar hemorrhage in 2006, stroke 2015, and MI 2015.  Clinical Impression  Patient evaluated by Physical Therapy with no further acute PT needs identified. Patient reports feeling well this morning. He tolerated challenging dynamic gait tasks without requiring physical assist. States he is at his baseline level of function. Uses a cane since prior stroke, but his goal is to be able to ambulate without this device. Discussed following up with an outpatient physical therapy clinic to progress patient's gait abilities. All education has been completed and the patient has no further questions. See below for any follow-up Physial Therapy or equipment needs. PT is signing off. Thank you for this referral.     Follow Up Recommendations Outpatient PT    Equipment Recommendations  None recommended by PT    Recommendations for Other Services       Precautions / Restrictions Precautions Precautions: Fall Restrictions Weight Bearing Restrictions: No      Mobility  Bed Mobility               General bed mobility comments: Sitting in chair  Transfers Overall transfer level: Modified independent Equipment used: Straight cane Transfers: Sit to/from Stand Sit to Stand: Modified independent (Device/Increase time)         General transfer comment: Did not require physical assist  Ambulation/Gait Ambulation/Gait assistance: Supervision Ambulation Distance (Feet): 400 Feet Assistive device: Straight cane Gait Pattern/deviations: Step-through pattern;Decreased step length - right;Decreased stance time - left;Decreased stride length;Decreased dorsiflexion - left;Decreased weight shift to left (poor Lt hip flexion and Lt heel  strike) Gait velocity: decreased   General Gait Details: Patient tolerated dynamic gait challenges including high marching, quick turns, variable speeds, and stepping backwards. Did not require physical assist at any point. Supervision provided for safety but pt feels that he is at his baseline level of function with use of a cane for stability. No buckling of LEs noted, nor did he demonstrate any loss of balance during ambulatory bout.  Stairs            Wheelchair Mobility    Modified Rankin (Stroke Patients Only) Modified Rankin (Stroke Patients Only) Pre-Morbid Rankin Score: Slight disability Modified Rankin: Slight disability     Balance Overall balance assessment: Needs assistance Sitting-balance support: No upper extremity supported;Feet supported Sitting balance-Leahy Scale: Normal     Standing balance support: No upper extremity supported Standing balance-Leahy Scale: Fair                               Pertinent Vitals/Pain Pain Assessment: No/denies pain    Home Living Family/patient expects to be discharged to:: Private residence Living Arrangements: Alone Available Help at Discharge: Neighbor Type of Home: Apartment Home Access: Level entry     Home Layout: One level Home Equipment: Cane - single point      Prior Function Level of Independence: Independent with assistive device(s)         Comments: Has been using a cane since 2015 after stroke     Hand Dominance   Dominant Hand: Right    Extremity/Trunk Assessment   Upper Extremity Assessment: Defer to OT evaluation           Lower Extremity  Assessment: LLE deficits/detail   LLE Deficits / Details: decreased hip flexion and knee flexion as compared to Rt side. Reports this is baseline since previous stroke     Communication   Communication: No difficulties  Cognition Arousal/Alertness: Awake/alert Behavior During Therapy: WFL for tasks assessed/performed Overall  Cognitive Status: Within Functional Limits for tasks assessed                      General Comments General comments (skin integrity, edema, etc.): Educated on modifiable risk factors for stroke including compliance with prescribed medications, and a healthy lifestyle to include proper diet and exercise as recommended by other health professionals. Patient's ultimate goal is to be able to ambulate without a cane. Discussed follow up with outpatient physical therapy services to help patient achieve this goal.    Exercises        Assessment/Plan    PT Assessment Patent does not need any further PT services  PT Diagnosis Abnormality of gait;Difficulty walking   PT Problem List    PT Treatment Interventions     PT Goals (Current goals can be found in the Care Plan section) Acute Rehab PT Goals Patient Stated Goal: Be able to walk without my cane PT Goal Formulation: All assessment and education complete, DC therapy    Frequency     Barriers to discharge        Co-evaluation               End of Session   Activity Tolerance: Patient tolerated treatment well Patient left: in chair;with call bell/phone within reach;with chair alarm set Nurse Communication: Mobility status;Other (comment) (Sign-off as independent in room)         Time: 0901-0919 PT Time Calculation (min) (ACUTE ONLY): 18 min   Charges:   PT Evaluation $Initial PT Evaluation Tier I: 1 Procedure     PT G CodesBerton Mount 07/16/2014, 11:12 AM Charlsie Merles, PT [phone]

## 2014-07-17 LAB — HEMOGLOBIN A1C
Hgb A1c MFr Bld: 5.9 % — ABNORMAL HIGH (ref 4.8–5.6)
Mean Plasma Glucose: 123 mg/dL

## 2014-07-19 LAB — HEPATITIS C ANTIBODY (REFLEX): HCV AB: NEGATIVE

## 2014-07-26 ENCOUNTER — Encounter: Payer: Medicaid Other | Admitting: Internal Medicine

## 2014-08-06 ENCOUNTER — Ambulatory Visit: Payer: Medicaid Other | Admitting: Internal Medicine

## 2014-11-11 ENCOUNTER — Encounter: Payer: Self-pay | Admitting: Neurology

## 2014-11-11 ENCOUNTER — Ambulatory Visit: Payer: Self-pay | Admitting: Neurology

## 2014-11-11 ENCOUNTER — Ambulatory Visit (INDEPENDENT_AMBULATORY_CARE_PROVIDER_SITE_OTHER): Payer: Medicaid Other | Admitting: Neurology

## 2014-11-11 VITALS — BP 158/90 | HR 79 | Ht 67.0 in | Wt 193.0 lb

## 2014-11-11 DIAGNOSIS — M5442 Lumbago with sciatica, left side: Secondary | ICD-10-CM

## 2014-11-11 DIAGNOSIS — M5441 Lumbago with sciatica, right side: Secondary | ICD-10-CM | POA: Diagnosis not present

## 2014-11-11 MED ORDER — TRAMADOL HCL 50 MG PO TABS: 100.0000 mg | ORAL_TABLET | Freq: Four times a day (QID) | ORAL | Status: DC | PRN

## 2014-11-11 NOTE — Patient Instructions (Signed)
I had a long d/w patient about his recent stroke, risk for recurrent stroke/TIAs, personally independently reviewed imaging studies and stroke evaluation results and answered questions.Continue aspirin 325 mg orally every day  for secondary stroke prevention and maintain strict control of hypertension with blood pressure goal below 130/90, diabetes with hemoglobin A1c goal below 6.5% and lipids with LDL cholesterol goal below 100 mg/dL. I also advised the patient to eat a healthy diet with plenty of whole grains, cereals, fruits and vegetables, exercise regularly and maintain ideal body weight .he was advised to be careful with his driving due to his partial vision loss. Patient requested a prescription for the active medication for his chronic pain as well as oxycodone was not working. I recommend tramadol 100 mg every 6 hours when necessary. He was given 60 tablets without refills and advised to follow-up with his primary physician for further prescriptions or alternative medications. He will return for follow-up in 6 months or call earlier if necessary.   Stroke Prevention Some medical conditions and behaviors are associated with an increased chance of having a stroke. You may prevent a stroke by making healthy choices and managing medical conditions. HOW CAN I REDUCE MY RISK OF HAVING A STROKE?   Stay physically active. Get at least 30 minutes of activity on most or all days.  Do not smoke. It may also be helpful to avoid exposure to secondhand smoke.  Limit alcohol use. Moderate alcohol use is considered to be:  No more than 2 drinks per day for men.  No more than 1 drink per day for nonpregnant women.  Eat healthy foods. This involves:  Eating 5 or more servings of fruits and vegetables a day.  Making dietary changes that address high blood pressure (hypertension), high cholesterol, diabetes, or obesity.  Manage your cholesterol levels.  Making food choices that are high in fiber and low  in saturated fat, trans fat, and cholesterol may control cholesterol levels.  Take any prescribed medicines to control cholesterol as directed by your health care provider.  Manage your diabetes.  Controlling your carbohydrate and sugar intake is recommended to manage diabetes.  Take any prescribed medicines to control diabetes as directed by your health care provider.  Control your hypertension.  Making food choices that are low in salt (sodium), saturated fat, trans fat, and cholesterol is recommended to manage hypertension.  Take any prescribed medicines to control hypertension as directed by your health care provider.  Maintain a healthy weight.  Reducing calorie intake and making food choices that are low in sodium, saturated fat, trans fat, and cholesterol are recommended to manage weight.  Stop drug abuse.  Avoid taking birth control pills.  Talk to your health care provider about the risks of taking birth control pills if you are over 28 years old, smoke, get migraines, or have ever had a blood clot.  Get evaluated for sleep disorders (sleep apnea).  Talk to your health care provider about getting a sleep evaluation if you snore a lot or have excessive sleepiness.  Take medicines only as directed by your health care provider.  For some people, aspirin or blood thinners (anticoagulants) are helpful in reducing the risk of forming abnormal blood clots that can lead to stroke. If you have the irregular heart rhythm of atrial fibrillation, you should be on a blood thinner unless there is a good reason you cannot take them.  Understand all your medicine instructions.  Make sure that other conditions (such as anemia  or atherosclerosis) are addressed. SEEK IMMEDIATE MEDICAL CARE IF:   You have sudden weakness or numbness of the face, arm, or leg, especially on one side of the body.  Your face or eyelid droops to one side.  You have sudden confusion.  You have trouble  speaking (aphasia) or understanding.  You have sudden trouble seeing in one or both eyes.  You have sudden trouble walking.  You have dizziness.  You have a loss of balance or coordination.  You have a sudden, severe headache with no known cause.  You have new chest pain or an irregular heartbeat. Any of these symptoms may represent a serious problem that is an emergency. Do not wait to see if the symptoms will go away. Get medical help at once. Call your local emergency services (911 in [place] Do not drive yourself to the hospital. Document Released: 05/03/2004 Document Revised: 08/10/2013 Document Reviewed: 09/26/2012 [facility] Patient Information 2015 [place], [place]. This information is not intended to replace advice given to you by your health care provider. Make sure you discuss any questions you have with your health care provider.

## 2014-11-11 NOTE — Progress Notes (Signed)
[facility] [address] [place]. [place] [address] [phone]       OFFICE FOLLOW-UP NOTE  [name] Date of Birth:  [birth year] Medical Record Number:  [id number]   HPI: He is seen today for follow-up for first office visit following hospital admission for stroke on  07/09/14.Patient is a 49 year old with a history of hypertension, coronary artery disease (status post PCI 2 drug-eluting stents), GERD, chronic low back pain who presents with an occipital headache lasting 3 days. He states that it is a dull pain that radiates to the front of his head. He states that it is around an 8 out of 10 in severity and that his home Percocet helps a little bit for his headache. Patient states that the only time he had a headache similar to this in the past was when he had his hemorrhagic cerebellar stroke in 2006, although he says that the severity of the pain this time is much less. Patient also reports associated right-sided blurry vision. He states that this symptom has resolved although his headache has not resolved. Otherwise, patient denies any language deficits, unilateral weakness, dysarthria, or facial droop. Patient states that he walks with a cane since the previous stroke in 2015 that was remarkable for left leg weakness. CT head initially was unremarkable but MRI scan showed left occipital pole infarct. Patient presented outside the time window for TPA. 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 Transthoracic echo showed normal ejection fraction. Hemoglobin A1c was 5.9. LDL cholesterol was elevated at 145, HDL 38 and total cholesterol 199 mg percent. Vitamin B12 and TSH are normal. Patient was started on aspirin and Plavix and seen by physical outpatient therapy. He states his done well since discharge his cardiologist's asked him to discontinue Plavix  and is taking only aspirin now. States his blood pressure is well controlled though today it is slightly elevated at 158/909 office. He has been driving limitedly and has not had any problems or accidents. He states his vision is improving though he still has. Partial peripheral vision loss. He has had chronic back pain and has undergone back surgery which did not help. He has been getting oxycodone but he does not like it due to side effects and is asking for alternate and medications.   ROS:   14 system review of systems is positive for  vision difficulties, gait difficulty, weakness, importance, joint pain and all other systems negative PMH:  Past Medical History  Diagnosis Date  . Hypertension   . 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  . Acid reflux   . Headache     "monthly" (02/18/2014)  . Stroke 2006    "left leg weak since" (02/18/2014)  . Chronic lower back pain   . 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 Marital Status: Single    Spouse Name: N/A  . Number of Children: N/A  . Years of Education: N/A   Occupational History  . umemployed      Works in [employer]    Social History Main Topics  . Smoking status: Never Smoker   . Smokeless tobacco: Never Used  . Alcohol Use: 3.6 oz/week    6 Cans of beer, 0 Standard drinks or equivalent per week  Comment: OCCASSIONALLY   . Drug Use: No  . Sexual Activity: Yes   Other Topics Concern  . Not on file   Social History Narrative   7 cups of caffeine a week     Medications:   Current Outpatient Prescriptions on File Prior to Visit  Medication Sig Dispense Refill  . aspirin EC 325 MG EC tablet Take 1 tablet (325 mg total) by mouth daily. 30 tablet 11  . nitroGLYCERIN (NITROSTAT) 0.4 MG SL tablet Place 1 tablet (0.4 mg total) under the tongue every 5 (five) minutes as needed for chest pain. 25 tablet 2    . omeprazole (PRILOSEC) 20 MG capsule Take 20 mg by mouth.     No current facility-administered medications on file prior to visit.    Allergies:  No Known Allergies  Physical Exam General: well developed, well nourished, seated, in no evident distress Head: head normocephalic and atraumatic.  Neck: supple with no carotid or supraclavicular bruits Cardiovascular: regular rate and rhythm, no murmurs Musculoskeletal: no deformity Skin:  no rash/petichiae Vascular:  Normal pulses all extremities Filed Vitals:   11/11/14 1509  BP: 158/90  Pulse: 79   Neurologic Exam Mental Status: Awake and fully alert. Oriented to place and time. Recent and remote memory intact. Attention span, concentration and fund of knowledge appropriate. Mood and affect appropriate.  Cranial Nerves: Fundoscopic exam reveals sharp disc margins. Pupils equal, briskly reactive to light. Extraocular movements full without nystagmus. Visual fields show partial right homonymous hemianopsia to confrontation. Hearing intact. Facial sensation intact. Mild left lower face weakness., Tongue, palate moves normally and symmetrically.  Motor: Normal bulk and tone. Normal strength in all tested extremity muscles except left foot drop and mild weakness of left grip and intrinsic hand muscles. Orbits right over left upper extremity. Fine finger movements are diminished on the left. Sensory.: intact to touch ,pinprick .position and vibratory sensation.  Coordination: Rapid alternating movements are diminished on the left side Finger-to-nose and heel-to-shin performed slowly on the left side Gait and Station: Arises from chair without difficulty. Stance is stooped with favoring of his back due to pain. Spastic hemiplegic gait with left foot drop and dragging of left leg. Uses a cane.  Reflexes: 2+ and asymmetric and brisker on the left. Toes downgoing.   NIHSS  2 Modified Rankin  2   ASSESSMENT: 49 year male with left occipital  lobe infarct secondary to intracranial  Atherosclerosis in April 2016.  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    PLAN: I had a long d/w patient about his recent stroke, risk for recurrent stroke/TIAs, personally independently reviewed imaging studies and stroke evaluation results and answered questions.Continue aspirin 325 mg orally every day  for secondary stroke prevention and maintain strict control of hypertension with blood pressure goal below 130/90, diabetes with hemoglobin A1c goal below 6.5% and lipids with LDL cholesterol goal below 100 mg/dL. I also advised the patient to eat a healthy diet with plenty of whole grains, cereals, fruits and vegetables, exercise regularly and maintain ideal body weight .he was advised to be careful with his driving due to his partial vision loss. Patient requested a prescription for the active medication for his chronic pain as well as oxycodone was not working. I recommend tramadol 100 mg every 6 hours when necessary. He was given 60 tablets without refills and advised to follow-up with his primary physician for further prescriptions or alternative medications. He  will return for follow-up in 6 months or call earlier if necessary.    Delia Heady, MD   Note: This document was prepared with digital dictation and possible smart phrase technology. Any transcriptional errors that result from this process are unintentional

## 2014-11-12 ENCOUNTER — Telehealth: Payer: Self-pay | Admitting: Neurology

## 2014-11-12 NOTE — Telephone Encounter (Signed)
Rx was sent yesterday.  I called the pharmacy.  Spoke with pharmacist who said this Rx is ready for pick up.  I called the patient back.  Relayed Rx is ready per pharmacy.  He expressed understanding and said nothing further is needed at this time.

## 2014-11-12 NOTE — Telephone Encounter (Signed)
Pt called regarding prescription being sent in to [facility] for Tramadol

## 2014-11-16 ENCOUNTER — Telehealth: Payer: Self-pay

## 2014-11-16 ENCOUNTER — Telehealth: Payer: Self-pay | Admitting: Neurology

## 2014-11-16 NOTE — Telephone Encounter (Signed)
Patient is calling as his new Rx tramadol 50 mg is not working well for his pain.  He would like another pain Rx.  Please call.  Thanks!

## 2014-11-17 NOTE — Telephone Encounter (Signed)
Pt calling again regarding tramadol not working well. Pt would like another medication for pain.

## 2014-11-17 NOTE — Telephone Encounter (Signed)
I have routing this message to Dr.Sethi about the ultram not working. Dr.Sethi probably will not prescribed another pain medicine unless he comes in the office. I left a vm for pt today.If he calls back, he needs to schedule an appt with Dr. Pearlean Brownie. Thank you.

## 2014-11-19 NOTE — Telephone Encounter (Signed)
I had clearly stated in my last office visit visit with the patient that I was going to try ultram for his pain and if it did not work he was advised to seek further care with his primary care physician for his chronic back pain

## 2014-11-22 ENCOUNTER — Encounter: Payer: Self-pay | Admitting: Neurology

## 2014-11-22 ENCOUNTER — Ambulatory Visit (INDEPENDENT_AMBULATORY_CARE_PROVIDER_SITE_OTHER): Payer: Medicaid Other | Admitting: Neurology

## 2014-11-22 VITALS — BP 161/100 | HR 79 | Ht 67.0 in | Wt 194.4 lb

## 2014-11-22 DIAGNOSIS — M545 Low back pain, unspecified: Secondary | ICD-10-CM

## 2014-11-22 NOTE — Patient Instructions (Signed)
I had a long discussion with the patient regarding his chronic back pain and really stated that he that is an area which I would like him to follow-up with his primary care physician for an try alternative pain medications or perhaps seek referral to a pain clinic. I would like to concentrate my care on stroke prevention. Continue aspirin 325 mg orally every day  for secondary stroke prevention and maintain strict control of hypertension with blood pressure goal below 130/90, diabetes with hemoglobin A1c goal below 6.5% and lipids with LDL cholesterol goal below 100 mg/dL. I also advised the patient to eat a healthy diet with plenty of whole grains, cereals, fruits and vegetables, exercise regularly and maintain ideal body weight. I gave him a set of back stretching exercises to be done regularly. Followup in the future with me in 6 months or call earlier if necessary

## 2014-11-22 NOTE — Telephone Encounter (Signed)
Patient was seen by Dr Pearlean Brownie today.

## 2014-11-22 NOTE — Progress Notes (Signed)
[facility] [address] [place] [address] [phone]       OFFICE FOLLOW-UP NOTE  [name] Date of Birth:  [birth year] Medical Record Number:  [id number]   HPI: He is seen today for follow-up for first office visit following hospital admission for stroke on  07/09/14.Patient is a 49 year old with a history of hypertension, coronary artery disease (status post PCI 2 drug-eluting stents), GERD, chronic low back pain who presents with an occipital headache lasting 3 days. He states that it is a dull pain that radiates to the front of his head. He states that it is around an 8 out of 10 in severity and that his home Percocet helps a little bit for his headache. Patient states that the only time he had a headache similar to this in the past was when he had his hemorrhagic cerebellar stroke in 2006, although he says that the severity of the pain this time is much less. Patient also reports associated right-sided blurry vision. He states that this symptom has resolved although his headache has not resolved. Otherwise, patient denies any language deficits, unilateral weakness, dysarthria, or facial droop. Patient states that he walks with a cane since the previous stroke in 2015 that was remarkable for left leg weakness. CT head initially was unremarkable but MRI scan showed left occipital pole infarct. Patient presented outside the time window for TPA. 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 Transthoracic echo showed normal ejection fraction. Hemoglobin A1c was 5.9. LDL cholesterol was elevated at 145, HDL 38 and total cholesterol 199 mg percent. Vitamin B12 and TSH are normal. Patient was started on aspirin and Plavix and seen by physical outpatient therapy. He states his done well since discharge his cardiologist's asked him to discontinue Plavix  and is taking only aspirin now. States his blood pressure is well controlled though today it is slightly elevated at 158/909 office. He has been driving limitedly and has not had any problems or accidents. He states his vision is improving though he still has. Partial peripheral vision loss. He has had chronic back pain and has undergone back surgery which did not help. He has been getting oxycodone but he does not like it due to side effects and is asking for alternate and medications. Update 11/22/2014 : Patient is worked into the schedule today as he called complaining of, persistent back pain and tramadol not working and increased back pain. I had informed him and stated clearly in my last note that I would try tramadol only has one time and it did not work to seek further guidance from his primary care physician who has been managing his back pain for years. He has been taking Percocet when necessary as well with only short-term relief. His chronic back pain is being managed by his primary physician. He denies any radicular symptoms. He has had no recurrent stroke or TIA symptoms. He remains on aspirin which is tolerating well. He states his blood pressure has been running high mainly because of his pain. Blood pressure today is 161/100 in office.  ROS:   14 system review of systems is positive for  vision difficulties, gait difficulty, weakness, importance, joint pain and all other systems negative PMH:  Past Medical History  Diagnosis Date  . Hypertension   . Cerebellar hemorrhage     a. 05/2004 associated with hydrocephalus s/p evacuation  and ventriculostomy.  . Acid reflux   . Headache     "monthly" (02/18/2014)  . Stroke 2006    "left leg weak since" (02/18/2014)  . Chronic lower back pain   . 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 Marital Status: Single    Spouse Name: N/A  . Number of Children: N/A  . Years of  Education: N/A   Occupational History  . umemployed      Works in [employer]    Social History Main Topics  . Smoking status: Never Smoker   . Smokeless tobacco: Never Used  . Alcohol Use: 3.6 oz/week    6 Cans of beer, 0 Standard drinks or equivalent per week     Comment: OCCASSIONALLY   . Drug Use: No  . Sexual Activity: Yes   Other Topics Concern  . Not on file   Social History Narrative   7 cups of caffeine a week     Medications:   Current Outpatient Prescriptions on File Prior to Visit  Medication Sig Dispense Refill  . aspirin EC 325 MG EC tablet Take 1 tablet (325 mg total) by mouth daily. 30 tablet 11  . lisinopril (PRINIVIL,ZESTRIL) 20 MG tablet Take 20 mg by mouth.    . nitroGLYCERIN (NITROSTAT) 0.4 MG SL tablet Place 1 tablet (0.4 mg total) under the tongue every 5 (five) minutes as needed for chest pain. 25 tablet 2  . omeprazole (PRILOSEC) 20 MG capsule Take 20 mg by mouth.    . oxyCODONE-acetaminophen (PERCOCET) 10-325 MG per tablet TK 1 T PO  Q 6 H PRN P  0  . traMADol (ULTRAM) 50 MG tablet Take 2 tablets (100 mg total) by mouth every 6 (six) hours as needed. 60 tablet 0   No current facility-administered medications on file prior to visit.    Allergies:  No Known Allergies  Physical Exam General: well developed, well nourished, seated, in no evident distress Head: head normocephalic and atraumatic.  Neck: supple with no carotid or supraclavicular bruits Cardiovascular: regular rate and rhythm, no murmurs Musculoskeletal: no deformity Skin:  no rash/petichiae Vascular:  Normal pulses all extremities Filed Vitals:   11/22/14 1107  BP: 161/100  Pulse: 79   Neurologic Exam Mental Status: Awake and fully alert. Oriented to place and time. Recent and remote memory intact. Attention span, concentration and fund of knowledge appropriate. Mood and affect appropriate.  Cranial Nerves: Fundoscopic exam reveals sharp disc margins. Pupils equal, briskly reactive  to light. Extraocular movements full without nystagmus. Visual fields show partial right homonymous hemianopsia to confrontation. Hearing intact. Facial sensation intact. Mild left lower face weakness., Tongue, palate moves normally and symmetrically.  Motor: Normal bulk and tone. Normal strength in all tested extremity muscles except left foot drop and mild weakness of left grip and intrinsic hand muscles. Orbits right over left upper extremity. Fine finger movements are diminished on the left. Slight increased tone on the left side. Sensory.: intact to touch ,pinprick .position and vibratory sensation.  Coordination: Rapid alternating movements are diminished on the left side Finger-to-nose and heel-to-shin performed slowly on the left side Gait and Station: Arises from chair without difficulty. Stance is stooped with favoring of his back due to pain. Spastic hemiplegic gait with left foot drop and dragging of left leg. Uses a cane.  Reflexes: 2+ and asymmetric and brisker on the left. Toes downgoing.   NIHSS  2 Modified Rankin  2   ASSESSMENT: 49 year male with left occipital lobe infarct secondary to intracranial  Atherosclerosis in April 2016.  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 Chronic back pain with acute exacerbation from degenerative lumbar spine disease    PLAN: I had a long discussion with the patient regarding his chronic back pain and really stated that he that is an area which I would like him to follow-up with his primary care physician for an try alternative pain medications or perhaps seek referral to a pain clinic. I would like to concentrate my care on stroke prevention. Continue aspirin 325 mg orally every day  for secondary stroke prevention and maintain strict control of hypertension with blood pressure goal below 130/90, diabetes with hemoglobin A1c goal below 6.5% and lipids with LDL  cholesterol goal below 100 mg/dL. I also advised the patient to eat a healthy diet with plenty of whole grains, cereals, fruits and vegetables, exercise regularly and maintain ideal body weight. I gave him a set of back stretching exercises to be done regularly. Followup in the future with me in 6 months or call earlier if necessary   Delia Heady, MD   Note: This document was prepared with digital dictation and possible smart phrase technology. Any transcriptional errors that result from this process are unintentional

## 2015-05-24 ENCOUNTER — Ambulatory Visit: Payer: Medicaid Other | Admitting: Neurology

## 2015-05-25 ENCOUNTER — Encounter: Payer: Self-pay | Admitting: Neurology

## 2015-09-27 ENCOUNTER — Inpatient Hospital Stay (HOSPITAL_COMMUNITY): Payer: Medicaid Other

## 2015-09-27 ENCOUNTER — Emergency Department (HOSPITAL_COMMUNITY): Payer: Medicaid Other

## 2015-09-27 ENCOUNTER — Encounter (HOSPITAL_COMMUNITY): Payer: Self-pay | Admitting: Emergency Medicine

## 2015-09-27 ENCOUNTER — Inpatient Hospital Stay (HOSPITAL_COMMUNITY)
Admission: EM | Admit: 2015-09-27 | Discharge: 2015-09-28 | DRG: 066 | Disposition: A | Discharge status: Home / Self Care | Payer: Medicaid Other | Attending: Internal Medicine | Admitting: Internal Medicine

## 2015-09-27 ENCOUNTER — Other Ambulatory Visit: Payer: Self-pay

## 2015-09-27 DIAGNOSIS — Z7982 Long term (current) use of aspirin: Secondary | ICD-10-CM

## 2015-09-27 DIAGNOSIS — Z9861 Coronary angioplasty status: Secondary | ICD-10-CM

## 2015-09-27 DIAGNOSIS — M545 Low back pain: Secondary | ICD-10-CM | POA: Diagnosis present

## 2015-09-27 DIAGNOSIS — Z79899 Other long term (current) drug therapy: Secondary | ICD-10-CM

## 2015-09-27 DIAGNOSIS — Z823 Family history of stroke: Secondary | ICD-10-CM | POA: Diagnosis not present

## 2015-09-27 DIAGNOSIS — R29898 Other symptoms and signs involving the musculoskeletal system: Secondary | ICD-10-CM

## 2015-09-27 DIAGNOSIS — I63532 Cerebral infarction due to unspecified occlusion or stenosis of left posterior cerebral artery: Principal | ICD-10-CM | POA: Diagnosis present

## 2015-09-27 DIAGNOSIS — I2510 Atherosclerotic heart disease of native coronary artery without angina pectoris: Secondary | ICD-10-CM | POA: Diagnosis not present

## 2015-09-27 DIAGNOSIS — R519 Headache, unspecified: Secondary | ICD-10-CM

## 2015-09-27 DIAGNOSIS — I639 Cerebral infarction, unspecified: Secondary | ICD-10-CM | POA: Diagnosis present

## 2015-09-27 DIAGNOSIS — D509 Iron deficiency anemia, unspecified: Secondary | ICD-10-CM

## 2015-09-27 DIAGNOSIS — Z8249 Family history of ischemic heart disease and other diseases of the circulatory system: Secondary | ICD-10-CM

## 2015-09-27 DIAGNOSIS — I69341 Monoplegia of lower limb following cerebral infarction affecting right dominant side: Secondary | ICD-10-CM | POA: Diagnosis not present

## 2015-09-27 DIAGNOSIS — I252 Old myocardial infarction: Secondary | ICD-10-CM

## 2015-09-27 DIAGNOSIS — H53461 Homonymous bilateral field defects, right side: Secondary | ICD-10-CM | POA: Insufficient documentation

## 2015-09-27 DIAGNOSIS — M549 Dorsalgia, unspecified: Secondary | ICD-10-CM | POA: Diagnosis not present

## 2015-09-27 DIAGNOSIS — E669 Obesity, unspecified: Secondary | ICD-10-CM

## 2015-09-27 DIAGNOSIS — Z955 Presence of coronary angioplasty implant and graft: Secondary | ICD-10-CM

## 2015-09-27 DIAGNOSIS — K219 Gastro-esophageal reflux disease without esophagitis: Secondary | ICD-10-CM | POA: Diagnosis present

## 2015-09-27 DIAGNOSIS — R7302 Impaired glucose tolerance (oral): Secondary | ICD-10-CM | POA: Diagnosis present

## 2015-09-27 DIAGNOSIS — I10 Essential (primary) hypertension: Secondary | ICD-10-CM | POA: Diagnosis not present

## 2015-09-27 DIAGNOSIS — G8929 Other chronic pain: Secondary | ICD-10-CM | POA: Diagnosis present

## 2015-09-27 DIAGNOSIS — R29703 NIHSS score 3: Secondary | ICD-10-CM | POA: Diagnosis present

## 2015-09-27 DIAGNOSIS — I672 Cerebral atherosclerosis: Secondary | ICD-10-CM | POA: Diagnosis present

## 2015-09-27 DIAGNOSIS — I6789 Other cerebrovascular disease: Secondary | ICD-10-CM | POA: Diagnosis not present

## 2015-09-27 DIAGNOSIS — I69312 Visuospatial deficit and spatial neglect following cerebral infarction: Secondary | ICD-10-CM | POA: Diagnosis not present

## 2015-09-27 DIAGNOSIS — R739 Hyperglycemia, unspecified: Secondary | ICD-10-CM

## 2015-09-27 DIAGNOSIS — I2111 ST elevation (STEMI) myocardial infarction involving right coronary artery: Secondary | ICD-10-CM

## 2015-09-27 DIAGNOSIS — R9431 Abnormal electrocardiogram [ECG] [EKG]: Secondary | ICD-10-CM

## 2015-09-27 DIAGNOSIS — I6330 Cerebral infarction due to thrombosis of unspecified cerebral artery: Secondary | ICD-10-CM | POA: Diagnosis not present

## 2015-09-27 DIAGNOSIS — R51 Headache: Secondary | ICD-10-CM

## 2015-09-27 DIAGNOSIS — Z8673 Personal history of transient ischemic attack (TIA), and cerebral infarction without residual deficits: Secondary | ICD-10-CM | POA: Insufficient documentation

## 2015-09-27 DIAGNOSIS — E785 Hyperlipidemia, unspecified: Secondary | ICD-10-CM | POA: Diagnosis not present

## 2015-09-27 DIAGNOSIS — Z8679 Personal history of other diseases of the circulatory system: Secondary | ICD-10-CM

## 2015-09-27 LAB — ETHANOL: Alcohol, Ethyl (B): <5 mg/dL (ref <5)

## 2015-09-27 LAB — RAPID URINE DRUG SCREEN, HOSP PERFORMED
AMPHETAMINES: NOT DETECTED
BARBITURATES: NOT DETECTED
Benzodiazepines: NOT DETECTED
Cocaine: NOT DETECTED
Opiates: NOT DETECTED
TETRAHYDROCANNABINOL: NOT DETECTED

## 2015-09-27 LAB — CBC
HCT: 43.3 % (ref 39.0–52.0)
HEMOGLOBIN: 14.3 g/dL (ref 13.0–17.0)
MCH: 25.6 pg — AB (ref 26.0–34.0)
MCHC: 33 g/dL (ref 30.0–36.0)
MCV: 77.5 fL — ABNORMAL LOW (ref 78.0–100.0)
Platelets: 268 10*3/uL (ref 150–400)
RBC: 5.59 MIL/uL (ref 4.22–5.81)
RDW: 14.6 % (ref 11.5–15.5)
WBC: 10.5 10*3/uL (ref 4.0–10.5)

## 2015-09-27 LAB — I-STAT CHEM 8, ED
BUN: 16 mg/dL (ref 6–20)
CREATININE: 1.2 mg/dL (ref 0.61–1.24)
Calcium, Ion: 1.22 mmol/L (ref 1.12–1.23)
Chloride: 107 mmol/L (ref 101–111)
Glucose, Bld: 88 mg/dL (ref 65–99)
HEMATOCRIT: 44 % (ref 39.0–52.0)
Hemoglobin: 15 g/dL (ref 13.0–17.0)
Potassium: 3.9 mmol/L (ref 3.5–5.1)
Sodium: 143 mmol/L (ref 135–145)
TCO2: 22 mmol/L (ref 0–100)

## 2015-09-27 LAB — COMPREHENSIVE METABOLIC PANEL
ALBUMIN: 3.7 g/dL (ref 3.5–5.0)
ALK PHOS: 58 U/L (ref 38–126)
ALT: 21 U/L (ref 17–63)
AST: 19 U/L (ref 15–41)
Anion gap: 7 (ref 5–15)
BUN: 17 mg/dL (ref 6–20)
CO2: 22 mmol/L (ref 22–32)
Calcium: 8.8 mg/dL — ABNORMAL LOW (ref 8.9–10.3)
Chloride: 111 mmol/L (ref 101–111)
Creatinine, Ser: 1.11 mg/dL (ref 0.61–1.24)
GFR calc Af Amer: >60 mL/min (ref >60)
GFR calc non Af Amer: >60 mL/min (ref >60)
GLUCOSE: 90 mg/dL (ref 65–99)
Potassium: 3.8 mmol/L (ref 3.5–5.1)
Sodium: 140 mmol/L (ref 135–145)
Total Bilirubin: 0.7 mg/dL (ref 0.3–1.2)
Total Protein: 7.1 g/dL (ref 6.5–8.1)

## 2015-09-27 LAB — DIFFERENTIAL
BASOS ABS: 0 10*3/uL (ref 0.0–0.1)
Basophils Relative: 0 %
Eosinophils Absolute: 0.2 10*3/uL (ref 0.0–0.7)
Eosinophils Relative: 2 %
LYMPHS ABS: 1.2 10*3/uL (ref 0.7–4.0)
Lymphocytes Relative: 11 %
Monocytes Absolute: 1 10*3/uL (ref 0.1–1.0)
Monocytes Relative: 10 %
NEUTROS ABS: 8 10*3/uL — AB (ref 1.7–7.7)
Neutrophils Relative %: 77 %

## 2015-09-27 LAB — PROTIME-INR
INR: 1.05 (ref 0.00–1.49)
Prothrombin Time: 13.9 seconds (ref 11.6–15.2)

## 2015-09-27 LAB — I-STAT TROPONIN, ED: Troponin i, poc: 0.01 ng/mL (ref 0.00–0.08)

## 2015-09-27 LAB — URINALYSIS, ROUTINE W REFLEX MICROSCOPIC
BILIRUBIN URINE: NEGATIVE
Glucose, UA: NEGATIVE mg/dL
Hgb urine dipstick: NEGATIVE
Ketones, ur: NEGATIVE mg/dL
Leukocytes, UA: NEGATIVE
NITRITE: NEGATIVE
PH: 7 (ref 5.0–8.0)
Protein, ur: NEGATIVE mg/dL
Specific Gravity, Urine: 1.023 (ref 1.005–1.030)

## 2015-09-27 LAB — APTT: APTT: 29 s (ref 24–37)

## 2015-09-27 MED ORDER — OXYCODONE-ACETAMINOPHEN 5-325 MG PO TABS
1.0000 | ORAL_TABLET | Freq: Four times a day (QID) | ORAL | Status: DC | PRN
  Administered 2015-09-28: 1 via ORAL
  Filled 2015-09-27: qty 1

## 2015-09-27 MED ORDER — TAMSULOSIN HCL 0.4 MG PO CAPS: 0.4000 mg | ORAL_CAPSULE | Freq: Every day | ORAL | Status: DC

## 2015-09-27 MED ORDER — PANTOPRAZOLE SODIUM 40 MG PO TBEC
40.0000 mg | DELAYED_RELEASE_TABLET | Freq: Every day | ORAL | Status: DC
  Administered 2015-09-28: 40 mg via ORAL
  Filled 2015-09-27: qty 1

## 2015-09-27 MED ORDER — ENOXAPARIN SODIUM 40 MG/0.4ML ~~LOC~~ SOLN
40.0000 mg | SUBCUTANEOUS | Status: DC
  Administered 2015-09-28: 40 mg via SUBCUTANEOUS
  Filled 2015-09-27: qty 0.4

## 2015-09-27 MED ORDER — TRAZODONE HCL 50 MG PO TABS
50.0000 mg | ORAL_TABLET | Freq: Every day | ORAL | Status: DC
  Administered 2015-09-28: 50 mg via ORAL
  Filled 2015-09-27: qty 1

## 2015-09-27 MED ORDER — ASPIRIN 81 MG PO CHEW
324.0000 mg | CHEWABLE_TABLET | Freq: Once | ORAL | Status: AC
  Administered 2015-09-27: 324 mg via ORAL
  Filled 2015-09-27: qty 4

## 2015-09-27 MED ORDER — NITROGLYCERIN 0.4 MG SL SUBL: 0.4000 mg | SUBLINGUAL_TABLET | SUBLINGUAL | Status: DC | PRN

## 2015-09-27 MED ORDER — SODIUM CHLORIDE 0.9 % IV SOLN: INTRAVENOUS | Status: DC

## 2015-09-27 MED ORDER — SENNOSIDES-DOCUSATE SODIUM 8.6-50 MG PO TABS: 1.0000 | ORAL_TABLET | Freq: Every evening | ORAL | Status: DC | PRN

## 2015-09-27 MED ORDER — STROKE: EARLY STAGES OF RECOVERY BOOK
Freq: Once | Status: AC
  Administered 2015-09-27: 23:00:00
  Filled 2015-09-27: qty 1

## 2015-09-27 MED ORDER — OXYCODONE HCL 5 MG PO TABS
5.0000 mg | ORAL_TABLET | Freq: Four times a day (QID) | ORAL | Status: DC | PRN
  Administered 2015-09-28: 5 mg via ORAL
  Filled 2015-09-27: qty 1

## 2015-09-27 MED ORDER — ASPIRIN EC 81 MG PO TBEC
81.0000 mg | DELAYED_RELEASE_TABLET | Freq: Every day | ORAL | Status: DC
  Administered 2015-09-28: 81 mg via ORAL
  Filled 2015-09-27: qty 1

## 2015-09-27 MED ORDER — HYDROMORPHONE HCL 1 MG/ML IJ SOLN
1.0000 mg | Freq: Once | INTRAMUSCULAR | Status: AC
  Administered 2015-09-27: 1 mg via INTRAVENOUS
  Filled 2015-09-27: qty 1

## 2015-09-27 MED ORDER — OXYCODONE-ACETAMINOPHEN 10-325 MG PO TABS: 1.0000 | ORAL_TABLET | Freq: Four times a day (QID) | ORAL | Status: DC | PRN

## 2015-09-27 MED ORDER — LISINOPRIL 20 MG PO TABS
20.0000 mg | ORAL_TABLET | Freq: Every day | ORAL | Status: DC
  Administered 2015-09-28: 20 mg via ORAL
  Filled 2015-09-27: qty 1

## 2015-09-27 MED ORDER — SODIUM CHLORIDE 0.9 % IV SOLN
INTRAVENOUS | Status: DC
  Administered 2015-09-27: 17:00:00 via INTRAVENOUS

## 2015-09-27 NOTE — ED Notes (Signed)
Called [facility] for transport 

## 2015-09-27 NOTE — Progress Notes (Signed)
Patient arrived to 5C12 AAOx4 and pleasant. Tele placed and call bell at his side. Bed alarm activated. Will continue to monitor. Leoni Goodness, Dayton ScrapeSarah E, RN

## 2015-09-27 NOTE — ED Provider Notes (Addendum)
Patient signed out to me I examine patient at 8 PM. He complains of left leg pain which is increased over baseline onset 3 days ago. MRI scan consistent with acute stroke. I spoke with Dr. Roseanne RenoStewart who [facility] and admission under hospitalist service. Patient passed swallow screen. Aspirin ordered Results for orders placed or performed during the hospital encounter of 09/27/15  Ethanol  Result Value Ref Range   Alcohol, Ethyl (B) <5 <5 mg/dL  Protime-INR  Result Value Ref Range   Prothrombin Time 13.9 11.6 - 15.2 seconds   INR 1.05 0.00 - 1.49  APTT  Result Value Ref Range   aPTT 29 24 - 37 seconds  CBC  Result Value Ref Range   WBC 10.5 4.0 - 10.5 K/uL   RBC 5.59 4.22 - 5.81 MIL/uL   Hemoglobin 14.3 13.0 - 17.0 g/dL   HCT 16.143.3 09.639.0 - 04.552.0 %   MCV 77.5 (L) 78.0 - 100.0 fL   MCH 25.6 (L) 26.0 - 34.0 pg   MCHC 33.0 30.0 - 36.0 g/dL   RDW 40.914.6 81.111.5 - 91.415.5 %   Platelets 268 150 - 400 K/uL  Differential  Result Value Ref Range   Neutrophils Relative % 77 %   Neutro Abs 8.0 (H) 1.7 - 7.7 K/uL   Lymphocytes Relative 11 %   Lymphs Abs 1.2 0.7 - 4.0 K/uL   Monocytes Relative 10 %   Monocytes Absolute 1.0 0.1 - 1.0 K/uL   Eosinophils Relative 2 %   Eosinophils Absolute 0.2 0.0 - 0.7 K/uL   Basophils Relative 0 %   Basophils Absolute 0.0 0.0 - 0.1 K/uL  Comprehensive metabolic panel  Result Value Ref Range   Sodium 140 135 - 145 mmol/L   Potassium 3.8 3.5 - 5.1 mmol/L   Chloride 111 101 - 111 mmol/L   CO2 22 22 - 32 mmol/L   Glucose, Bld 90 65 - 99 mg/dL   BUN 17 6 - 20 mg/dL   Creatinine, Ser 7.821.11 0.61 - 1.24 mg/dL   Calcium 8.8 (L) 8.9 - 10.3 mg/dL   Total Protein 7.1 6.5 - 8.1 g/dL   Albumin 3.7 3.5 - 5.0 g/dL   AST 19 15 - 41 U/L   ALT 21 17 - 63 U/L   Alkaline Phosphatase 58 38 - 126 U/L   Total Bilirubin 0.7 0.3 - 1.2 mg/dL   GFR calc non Af Amer >60 >60 mL/min   GFR calc Af Amer >60 >60 mL/min   Anion gap 7 5 - 15  Urine rapid drug  screen (hosp performed)not at [facility]  Result Value Ref Range   Opiates NONE DETECTED NONE DETECTED   Cocaine NONE DETECTED NONE DETECTED   Benzodiazepines NONE DETECTED NONE DETECTED   Amphetamines NONE DETECTED NONE DETECTED   Tetrahydrocannabinol NONE DETECTED NONE DETECTED   Barbiturates NONE DETECTED NONE DETECTED  Urinalysis, Routine w reflex microscopic (not at [facility])  Result Value Ref Range   Color, Urine YELLOW YELLOW   APPearance CLEAR CLEAR   Specific Gravity, Urine 1.023 1.005 - 1.030   pH 7.0 5.0 - 8.0   Glucose, UA NEGATIVE NEGATIVE mg/dL   Hgb urine dipstick NEGATIVE NEGATIVE   Bilirubin Urine NEGATIVE NEGATIVE   Ketones, ur NEGATIVE NEGATIVE mg/dL   Protein, ur NEGATIVE NEGATIVE mg/dL   Nitrite NEGATIVE NEGATIVE   Leukocytes, UA NEGATIVE NEGATIVE  I-Stat Chem 8, ED  (not at [facility])  Result Value Ref Range   Sodium  143 135 - 145 mmol/L   Potassium 3.9 3.5 - 5.1 mmol/L   Chloride 107 101 - 111 mmol/L   BUN 16 6 - 20 mg/dL   Creatinine, Ser 1.61 0.61 - 1.24 mg/dL   Glucose, Bld 88 65 - 99 mg/dL   Calcium, Ion 0.96 0.45 - 1.23 mmol/L   TCO2 22 0 - 100 mmol/L   Hemoglobin 15.0 13.0 - 17.0 g/dL   HCT 40.9 81.1 - 91.4 %  I-stat troponin, ED (not at [facility])  Result Value Ref Range   Troponin i, poc 0.01 0.00 - 0.08 ng/mL   Comment 3           Ct Head Wo Contrast  09/27/2015  CLINICAL DATA:  Leg weakness and pain along the back of the head starting 2 days ago. Remote stroke. EXAM: CT HEAD WITHOUT CONTRAST TECHNIQUE: Contiguous axial images were obtained from the base of the skull through the vertex without intravenous contrast. COMPARISON:  07/15/2014 CT and MRI exams FINDINGS: Encephalomalacia in the right cerebellum with prior suboccipital craniectomy. Remote left posterior cerebral artery distribution infarct. Remote lacunar infarcts in the right external capsule and right periventricular white matter. Periventricular white matter and corona radiata hypodensities  favor chronic ischemic microvascular white matter disease. No intracranial hemorrhage, mass lesion, or acute CVA. IMPRESSION: 1. No acute intracranial findings. 2. Right cerebellar encephalomalacia with prior left PCA distribution infarct and remote lacunar infarcts in the right external capsule and periventricular white matter. 3. Periventricular white matter and corona radiata hypodensities favor chronic ischemic microvascular white matter disease. 4. Prior suboccipital craniectomy. Electronically Signed   By: Gaylyn Rong M.D.   On: 09/27/2015 15:40   Mr Brain Wo Contrast  09/27/2015  CLINICAL DATA:  Posterior headache and worsening right leg weakness beginning 2 days ago. 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 EXAM: MRI HEAD WITHOUT CONTRAST TECHNIQUE: Multiplanar, multiecho pulse sequences of the brain and surrounding structures were obtained without intravenous contrast. COMPARISON:  Head CT 09/27/2015 and MRI 07/15/2014 FINDINGS: There is a 4 mm focus of mildly restricted diffusion involving right temporoparietal white matter adjacent to the lateral ventricle consistent with acute infarct. There is also a 9 mm region of abnormal trace diffusion signal in the posterior left corona radiata with normal to subtly diminished ADC, also likely reflecting an acute or early subacute infarct given patient's worsening right leg weakness. No associated hemorrhage. No mass, midline shift, or extra-axial fluid collection. Global cerebral atrophy is unchanged from the prior MRI and mildly advanced for age. There is a chronic left occipital PCA territory infarct which was acute on the prior MRI. Patchy T2 hyperintensities elsewhere in the cerebral white matter bilaterally are unchanged and compatible with moderately advanced chronic small vessel ischemic disease for age. Chronic deep cerebral white matter/ basal ganglia and thalamic lacunar infarcts are noted  bilaterally. A large area of encephalomalacia is again seen involving the right cerebellum related to prior hemorrhage with ex vacuo dilatation of the fourth ventricle. Sequelae of suboccipital craniectomy are again identified. There are also small chronic infarcts in the right pons. Orbits are unremarkable. 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 Paranasal sinuses and mastoid air cells are clear. Major intracranial vascular flow voids are preserved. IMPRESSION: 1. Small, acute to early subacute infarcts in the left corona radiata and  right temporoparietal white matter. 2. Moderate chronic small vessel ischemic disease. 3. Chronic left PCA infarct. 4. Chronic cerebellar encephalomalacia. Electronically Signed   By: Sebastian Ache M.D.   On: 09/27/2015 19:24   I consulted Dr. Arlyss Queen who will evaluate patient in the emergency department and make arrangements for transfer to [facility]. Patient will be admitted to telemetry bed at [facility] Diagnosis subacute stroke  Doug Sou, MD 09/27/15 1610  Doug Sou, MD 09/27/15 2131

## 2015-09-27 NOTE — ED Notes (Signed)
PT have been made aware of urine sample 

## 2015-09-27 NOTE — ED Notes (Signed)
Pt successfully competed MRI. [facility]

## 2015-09-27 NOTE — ED Provider Notes (Addendum)
CSN: [id number]     Arrival date & time 09/27/15  1258 History   First MD Initiated Contact with Patient 09/27/15 1421     No chief complaint on file.    (Consider location/radiation/quality/duration/timing/severity/associated sxs/prior Treatment) The history is provided by the patient.  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 Patient has a right sided visual field deficit and has residual right leg weakness.  Patient states that 2 days ago this got worse the right leg weakness. And that he developed a posterior headache which she says is 5 out of 10. Has no neck stiffness. Patient has some baseline chronic low back pain but no new or worse back pain or stiffness.  No fevers no nausea or vomiting.  Past Medical History  Diagnosis Date  . Hypertension   . 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  . Acid reflux   . Headache     "monthly" (02/18/2014)  . Stroke [facility](HCC) 2006    "left leg weak since" (02/18/2014)  . Chronic lower back pain   . Inferior [place] [id number](HCC) 02/17/2014    [name]/notes 02/17/2014   Past Surgical History  Procedure Laterality Date  . Back surgery    . Brain hematoma evacuation  2006  . Ventriculostomy  2006  . Lumbar disc surgery  2013  . Tracheostomy  05/2004  . Left heart catheterization with coronary angiogram N/A 02/17/2014    Procedure: LEFT HEART CATHETERIZATION WITH CORONARY ANGIOGRAM;  Surgeon: Lesleigh NoeHenry W Quintero III, MD;  [facility];  Service: Cardiovascular;  Laterality: N/A;  . Percutaneous coronary stent intervention (pci-s)  02/17/2014    Procedure: PERCUTANEOUS CORONARY STENT INTERVENTION (PCI-S);  Surgeon: Lesleigh NoeHenry W Baston III, MD;  [facility];  Service: Cardiovascular;;   Family History  Problem Relation Age of Onset  . Stroke    . Heart Problems Father   .  Hypertension Mother    Social History  Substance Use Topics  . Smoking status: Never Smoker   . Smokeless tobacco: Never Used  . Alcohol Use: 3.6 oz/week    6 Cans of beer, 0 Standard drinks or equivalent per week     Comment: OCCASSIONALLY     Review of Systems  Constitutional: Negative for fever.  HENT: Negative for congestion and sinus pressure.   Eyes: Positive for visual disturbance.  Respiratory: Negative for shortness of breath.   Cardiovascular: Negative for chest pain.  Gastrointestinal: Negative for nausea, vomiting and abdominal pain.  Genitourinary: Negative for dysuria.  Musculoskeletal: Positive for back pain. Negative for neck pain and neck stiffness.  Skin: Negative for rash.  Neurological: Positive for weakness and headaches. Negative for numbness.  Hematological: Does not bruise/bleed easily.  Psychiatric/Behavioral: Negative for confusion.      Allergies  Review of patient's allergies indicates no known allergies.  Home Medications   Prior to Admission medications   Medication Sig Start Date End Date Taking? Authorizing Provider  lisinopril (PRINIVIL,ZESTRIL) 20 MG tablet Take 20 mg by mouth daily. 09/19/15  Yes Historical Provider, MD  nitroGLYCERIN (NITROSTAT) 0.4 MG SL tablet Place 1 tablet (0.4 mg total) under the tongue every 5 (five) minutes as needed for chest pain. 02/19/14  Yes Brittainy Sherlynn CarbonM Simmons, PA-C  omeprazole (PRILOSEC) 20 MG capsule Take 20 mg by mouth.   Yes Historical Provider, MD  oxyCODONE-acetaminophen (PERCOCET) 10-325 MG  per tablet take 1 tablet 4 times daily as needed for pain 11/04/14  Yes Historical Provider, MD  RA ASPIRIN EC 81 MG EC tablet Take 81 mg by mouth daily. 08/01/15  Yes Historical Provider, MD  traZODone (DESYREL) 50 MG tablet Take 50 mg by mouth at bedtime. 08/24/15  Yes Historical Provider, MD   BP 153/104 mmHg  Pulse 88  Temp(Src) 98.1 F (36.7 C) (Oral)  Resp 19  SpO2 98% Physical Exam  Constitutional: He is  oriented to person, place, and time. He appears well-developed and well-nourished. No distress.  HENT:  Head: Normocephalic and atraumatic.  Mouth/Throat: Oropharynx is clear and moist.  Eyes: Conjunctivae and EOM are normal. Pupils are equal, round, and reactive to light.  Neck: Normal range of motion. Neck supple.  Cardiovascular: Normal rate, regular rhythm and normal heart sounds.   No murmur heard. Pulmonary/Chest: Effort normal and breath sounds normal. No respiratory distress.  Abdominal: Soft. Bowel sounds are normal. There is no tenderness.  Musculoskeletal: Normal range of motion. He exhibits no edema.  Neurological: He is alert and oriented to person, place, and time. A cranial nerve deficit is present.  Old visual field deficit to the right peripheral part of his vision. Also slight weakness to left lower extremity patient states that this is worse than baseline.  Skin: Skin is warm.  Nursing note and vitals reviewed.   ED Course  Procedures (including critical care time) Labs Review Labs Reviewed  CBC - Abnormal; Notable for the following:    MCV 77.5 (*)    MCH 25.6 (*)    All other components within normal limits  DIFFERENTIAL - Abnormal; Notable for the following:    Neutro Abs 8.0 (*)    All other components within normal limits  COMPREHENSIVE METABOLIC PANEL - Abnormal; Notable for the following:    Calcium 8.8 (*)    All other components within normal limits  ETHANOL  PROTIME-INR  APTT  URINE RAPID DRUG SCREEN, HOSP PERFORMED  URINALYSIS, ROUTINE W REFLEX MICROSCOPIC (NOT AT [facility])  I-STAT CHEM 8, ED  I-STAT TROPOININ, ED   Results for orders placed or performed during the hospital encounter of 09/27/15  Ethanol  Result Value Ref Range   Alcohol, Ethyl (B) <5 <5 mg/dL  Protime-INR  Result Value Ref Range   Prothrombin Time 13.9 11.6 - 15.2 seconds   INR 1.05 0.00 - 1.49  APTT  Result Value Ref Range   aPTT 29 24 - 37 seconds  CBC  Result Value Ref  Range   WBC 10.5 4.0 - 10.5 K/uL   RBC 5.59 4.22 - 5.81 MIL/uL   Hemoglobin 14.3 13.0 - 17.0 g/dL   HCT 13.2 44.0 - 10.2 %   MCV 77.5 (L) 78.0 - 100.0 fL   MCH 25.6 (L) 26.0 - 34.0 pg   MCHC 33.0 30.0 - 36.0 g/dL   RDW 72.5 36.6 - 44.0 %   Platelets 268 150 - 400 K/uL  Differential  Result Value Ref Range   Neutrophils Relative % 77 %   Neutro Abs 8.0 (H) 1.7 - 7.7 K/uL   Lymphocytes Relative 11 %   Lymphs Abs 1.2 0.7 - 4.0 K/uL   Monocytes Relative 10 %   Monocytes Absolute 1.0 0.1 - 1.0 K/uL   Eosinophils Relative 2 %   Eosinophils Absolute 0.2 0.0 - 0.7 K/uL   Basophils Relative 0 %   Basophils Absolute 0.0 0.0 - 0.1 K/uL  Comprehensive metabolic panel  Result  Value Ref Range   Sodium 140 135 - 145 mmol/L   Potassium 3.8 3.5 - 5.1 mmol/L   Chloride 111 101 - 111 mmol/L   CO2 22 22 - 32 mmol/L   Glucose, Bld 90 65 - 99 mg/dL   BUN 17 6 - 20 mg/dL   Creatinine, Ser 1.61 0.61 - 1.24 mg/dL   Calcium 8.8 (L) 8.9 - 10.3 mg/dL   Total Protein 7.1 6.5 - 8.1 g/dL   Albumin 3.7 3.5 - 5.0 g/dL   AST 19 15 - 41 U/L   ALT 21 17 - 63 U/L   Alkaline Phosphatase 58 38 - 126 U/L   Total Bilirubin 0.7 0.3 - 1.2 mg/dL   GFR calc non Af Amer >60 >60 mL/min   GFR calc Af Amer >60 >60 mL/min   Anion gap 7 5 - 15  Urine rapid drug screen (hosp performed)not at [facility]  Result Value Ref Range   Opiates NONE DETECTED NONE DETECTED   Cocaine NONE DETECTED NONE DETECTED   Benzodiazepines NONE DETECTED NONE DETECTED   Amphetamines NONE DETECTED NONE DETECTED   Tetrahydrocannabinol NONE DETECTED NONE DETECTED   Barbiturates NONE DETECTED NONE DETECTED  Urinalysis, Routine w reflex microscopic (not at [facility])  Result Value Ref Range   Color, Urine YELLOW YELLOW   APPearance CLEAR CLEAR   Specific Gravity, Urine 1.023 1.005 - 1.030   pH 7.0 5.0 - 8.0   Glucose, UA NEGATIVE NEGATIVE mg/dL   Hgb urine dipstick NEGATIVE NEGATIVE   Bilirubin Urine NEGATIVE NEGATIVE   Ketones, ur NEGATIVE  NEGATIVE mg/dL   Protein, ur NEGATIVE NEGATIVE mg/dL   Nitrite NEGATIVE NEGATIVE   Leukocytes, UA NEGATIVE NEGATIVE  I-Stat Chem 8, ED  (not at [facility])  Result Value Ref Range   Sodium 143 135 - 145 mmol/L   Potassium 3.9 3.5 - 5.1 mmol/L   Chloride 107 101 - 111 mmol/L   BUN 16 6 - 20 mg/dL   Creatinine, Ser 0.96 0.61 - 1.24 mg/dL   Glucose, Bld 88 65 - 99 mg/dL   Calcium, Ion 0.45 4.09 - 1.23 mmol/L   TCO2 22 0 - 100 mmol/L   Hemoglobin 15.0 13.0 - 17.0 g/dL   HCT 81.1 91.4 - 78.2 %  I-stat troponin, ED (not at [facility])  Result Value Ref Range   Troponin i, poc 0.01 0.00 - 0.08 ng/mL   Comment 3             Imaging Review Ct Head Wo Contrast  09/27/2015  CLINICAL DATA:  Leg weakness and pain along the back of the head starting 2 days ago. Remote stroke. EXAM: CT HEAD WITHOUT CONTRAST TECHNIQUE: Contiguous axial images were obtained from the base of the skull through the vertex without intravenous contrast. COMPARISON:  07/15/2014 CT and MRI exams FINDINGS: Encephalomalacia in the right cerebellum with prior suboccipital craniectomy. Remote left posterior cerebral artery distribution infarct. Remote lacunar infarcts in the right external capsule and right periventricular white matter. Periventricular white matter and corona radiata hypodensities favor chronic ischemic microvascular white matter disease. No intracranial hemorrhage, mass lesion, or acute CVA. IMPRESSION: 1. No acute intracranial findings. 2. Right cerebellar encephalomalacia with prior left PCA distribution infarct and remote lacunar infarcts in the right external capsule and periventricular white matter. 3. Periventricular white matter and corona radiata hypodensities favor chronic ischemic microvascular white matter disease. 4. Prior suboccipital craniectomy. Electronically Signed   By: Gaylyn Rong M.D.   On: 09/27/2015 15:40  I have personally reviewed and evaluated these images and lab results as part of my  medical decision-making.   EKG Interpretation   Date/Time:  Tuesday September 27 2015 15:32:37 EDT Ventricular Rate:  78 PR Interval:    QRS Duration: 108 QT Interval:  390 QTC Calculation: 445 R Axis:   26 Text Interpretation:  Sinus rhythm No significant change since last  tracing Confirmed by Tyshun Tuckerman  MD, Evalee Gerard [phone]) on 09/27/2015 6:10:41 PM      MDM   Final diagnoses:  Left leg weakness  Acute nonintractable headache, unspecified headache type    CVA not completely ruled out. Head CT negative for any evidence of bleed. Patient with increased left leg weakness. Also headache to the back of the head patient states 5 out of 10 no neck stiffness no low back stiffness or pain. Patient concerned about recurrent CVA. Patient had a posterior cerebral accident documented April 2016. Also in 2006 patient had a aneurysmal bleed.  Clinically doubt that this is a aneurysmal bleed or subarachnoid hemorrhage. Symptoms of them present for 2 days. Patient's had known history of hypertension which is been difficult to control. Current pressures are in normal range for him. She has a primary care doctor.  Presentation and negative head CTs discussed with neuro hospitalist Dr. Amada Jupiter. He recommended MRI. If negative can be discharged home if in doubt really discussed with neuro hospitalist on call.  MRI is gone down here at [facility] long hospital area patient will be transferred to [place] to have MRI done.    Vanetta Mulders, MD 09/27/15 1723  Addendum:  [facility] was awaiting [facility] but we are now informed that the [facility] MRI machine is now functional. Patient should be headed there to have it done. Discussed with swelling physician ED who will follow-up results.  Vanetta Mulders, MD 09/27/15 [phone]

## 2015-09-27 NOTE — ED Notes (Signed)
MRI started working again. Pt transported to [facility]. [facility] called at the same time as pt was leaving the department for MRI. Will call [facility] back if machine breaks again.

## 2015-09-27 NOTE — ED Notes (Addendum)
Pt with previous stroke 10 years ago, reports leg weakness and pain to the back of his head 7/10 that started 2 days ago. Pt says he is not able to ambulate as well as he could before 2 days. Denies fall or injury. Denies difficulty swallowing or breathing. Denies dizziness. Pt ambulates with a cane per normal.

## 2015-09-27 NOTE — H&P (Addendum)
History and Physical    George Villa ZOX:[id number] DOB: [birth year] DOA: 09/27/2015  Referring MD/NP/PA: Dr. Ethelda Chick PCP: Lahoma Crocker, MD  Patient coming from: home   Chief Complaint: left leg weakness  HPI: George Villa is a 50 y.o. 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 Patient ambulates with a cane at baseline, but had noted more difficulty ambulating since onset of the symptoms. He reports stumbling more than usual. Describes a throbbing headache on the right posterior aspect of his head. Associated symptoms included left leg pain.  This patient notes that symptoms seemed to be progressively worsening for which he came in for evaluated today. Denies having any chest pain, palpitations, difficulty swallowing, shortness of breath, lightheadedness, confusion, fever, chills, shortness of breath, nausea, vomiting, or dysuria. He reports taking all of his medications as advised. He does not smoke, reports only drinking intermittently throughout the week, and denies being a heavy daily drinker.  ED Course: Upon admission into the emergency department patient was seen to be afebrile with vitals within normal limits except for blood pressure seen as high as 158/114. Lab work was relatively unremarkable except for upper limit of normal WBC of 10.5 all other lab work was relatively within normal limits. CT scan of the brain showed previous old infarcts but no acute abnormalities. Subsequently MRI of the brain revealed small acute to early subacute infarct in the left corona radiata and the right temporoparietal white matter.   Review of Systems: As per HPI otherwise 10 point review of systems negative.   Past Medical History  Diagnosis Date   . Hypertension   . 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  . Acid reflux   . Headache     "monthly" (02/18/2014)  . Stroke [facility]) 2006    "left leg weak since" (02/18/2014)  . Chronic lower back pain   . 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 Back surgery    . Brain hematoma evacuation  2006  . Ventriculostomy  2006  . Lumbar disc surgery  2013  . Tracheostomy  05/2004  . Left heart catheterization with coronary angiogram N/A 02/17/2014    Procedure: LEFT HEART CATHETERIZATION WITH CORONARY ANGIOGRAM;  Surgeon: [name], MD;  Location[facility] CATH LAB;  Service: Cardiovascular;  Laterality: N/A;  . Percutaneous coronary stent intervention (pci-s)  02/17/2014    Procedure: PERCUTANEOUS CORONARY STENT INTERVENTION (PCI-S);  Surgeon: [name], MD;  Location: [facility] CATH LAB;  Service: Cardiovascular;;     reports that he has never smoked. He has never used smokeless tobacco. He reports that he drinks about 3.6 oz of alcohol per week. He reports that he does not use illicit drugs.  No Known Allergies  Family History  Problem Relation Age of Onset  . Stroke    . Heart Problems Father   . Hypertension Mother     Prior to Admission medications   Medication Sig Start Date End Date Taking? Authorizing Provider  lisinopril (PRINIVIL,ZESTRIL) 20 MG tablet Take 20 mg by mouth daily. 09/19/15  Yes Historical Provider, MD  nitroGLYCERIN (NITROSTAT) 0.4 MG SL tablet Place 1  tablet (0.4 mg total) under the tongue every 5 (five) minutes as needed for chest pain. 02/19/14  Yes Brittainy Sherlynn Carbon, PA-C  omeprazole (PRILOSEC) 20 MG capsule Take 20 mg by mouth.   Yes Historical Provider, MD  oxyCODONE-acetaminophen (PERCOCET) 10-325 MG per tablet take 1 tablet 4 times daily as needed for pain 11/04/14  Yes Historical Provider, MD  RA ASPIRIN EC 81 MG EC  tablet Take 81 mg by mouth daily. 08/01/15  Yes Historical Provider, MD  traZODone (DESYREL) 50 MG tablet Take 50 mg by mouth at bedtime. 08/24/15  Yes Historical Provider, MD    Physical Exam: Filed Vitals:   09/27/15 1602 09/27/15 1700 09/27/15 1800 09/27/15 1937  BP:  153/104 134/97 131/102  Pulse:  88 82 76  Temp: 98.1 F (36.7 C)   97.9 F (36.6 C)  TempSrc:    Oral  Resp:  SpO2:  98% 96% 97%      Constitutional:Obese male in NAD, calm, comfortable Filed Vitals:   09/27/15 1602 09/27/15 1700 09/27/15 1800 09/27/15 1937  BP:  153/104 134/97 131/102  Pulse:  88 82 76  Temp: 98.1 F (36.7 C)   97.9 F (36.6 C)  TempSrc:    Oral  Resp:  SpO2:  98% 96% 97%   Eyes: PERRL, lids and conjunctivae normal ENMT: Mucous membranes are moist. Posterior pharynx clear of any exudate or lesions.Normal dentition.  Neck: normal, supple, no masses, no thyromegaly Respiratory: clear to auscultation bilaterally, no wheezing, no crackles. Normal respiratory effort. No accessory muscle use.  Cardiovascular: Regular rate and rhythm, no murmurs / rubs / gallops. No extremity edema. 2+ pedal pulses. No carotid bruits.  Abdomen: no tenderness, no masses palpated. No hepatosplenomegaly. Bowel sounds positive.  Musculoskeletal: no clubbing / cyanosis. No joint deformity upper and lower extremities. Good ROM, no contractures. Normal muscle tone.   Skin: no rashes, lesions, ulcers. No induration Neurologic: CN 2-12 grossly intact. Sensation intact, DTR normal. Strength 4/5 in the left lower extremity all other extremities appear to be a 5 out of 5.  Psychiatric: Normal judgment and insight. Alert and oriented x 3. Normal mood.     Labs on Admission: I have personally reviewed following labs and imaging studies  CBC:  Recent Labs Lab 09/27/15 1558 09/27/15 1609  WBC 10.5  --   NEUTROABS 8.0*  --   HGB 14.3 15.0  HCT 43.3 44.0  MCV 77.5*  --   PLT 268  --    Basic  Metabolic Panel:  Recent Labs Lab 09/27/15 1558 09/27/15 1609  NA 140 143  K 3.8 3.9  CL 111 107  CO2 22  --   GLUCOSE 90 88  BUN 17 16  CREATININE 1.11 1.20  CALCIUM 8.8*  --    GFR: CrCl cannot be calculated (Unknown ideal weight.). Liver Function Tests:  Recent Labs Lab 09/27/15 1558  AST 19  ALT 21  ALKPHOS 58  BILITOT 0.7  PROT 7.1  ALBUMIN 3.7   No results for input(s): LIPASE, AMYLASE in the last 168 hours. No results for input(s): AMMONIA in the last 168 hours. Coagulation Profile:  Recent Labs Lab 09/27/15 1558  INR 1.05   Cardiac Enzymes: No results for input(s): CKTOTAL, CKMB, CKMBINDEX, TROPONINI in the last 168 hours. BNP (last 3 results) No results for input(s): PROBNP in the last 8760 hours. HbA1C: No results for input(s): HGBA1C in the last 72 hours. CBG: No results for  input(s): GLUCAP in the last 168 hours. Lipid Profile: No results for input(s): CHOL, HDL, LDLCALC, TRIG, CHOLHDL, LDLDIRECT in the last 72 hours. Thyroid Function Tests: No results for input(s): TSH, T4TOTAL, FREET4, T3FREE, THYROIDAB in the last 72 hours. Anemia Panel: No results for input(s): VITAMINB12, FOLATE, FERRITIN, TIBC, IRON, RETICCTPCT in the last 72 hours. Urine analysis:    Component Value Date/Time   COLORURINE YELLOW 09/27/2015 1551   APPEARANCEUR CLEAR 09/27/2015 1551   LABSPEC 1.023 09/27/2015 1551   PHURINE 7.0 09/27/2015 1551   GLUCOSEU NEGATIVE 09/27/2015 1551   HGBUR NEGATIVE 09/27/2015 1551   BILIRUBINUR NEGATIVE 09/27/2015 1551   KETONESUR NEGATIVE 09/27/2015 1551   PROTEINUR NEGATIVE 09/27/2015 1551   NITRITE NEGATIVE 09/27/2015 1551   LEUKOCYTESUR NEGATIVE 09/27/2015 1551   Sepsis Labs: No results found for this or any previous visit (from the past 240 hour(s)).   Radiological Exams on Admission: Ct Head Wo Contrast  09/27/2015  CLINICAL DATA:  Leg weakness and pain along the back of the head starting 2 days ago. Remote stroke. EXAM: CT  HEAD WITHOUT CONTRAST TECHNIQUE: Contiguous axial images were obtained from the base of the skull through the vertex without intravenous contrast. COMPARISON:  07/15/2014 CT and MRI exams FINDINGS: Encephalomalacia in the right cerebellum with prior suboccipital craniectomy. Remote left posterior cerebral artery distribution infarct. Remote lacunar infarcts in the right external capsule and right periventricular white matter. Periventricular white matter and corona radiata hypodensities favor chronic ischemic microvascular white matter disease. No intracranial hemorrhage, mass lesion, or acute CVA. IMPRESSION: 1. No acute intracranial findings. 2. Right cerebellar encephalomalacia with prior left PCA distribution infarct and remote lacunar infarcts in the right external capsule and periventricular white matter. 3. Periventricular white matter and corona radiata hypodensities favor chronic ischemic microvascular white matter disease. 4. Prior suboccipital craniectomy. Electronically Signed   By: Gaylyn RongWalter  Liebkemann M.D.   On: 09/27/2015 15:40   Mr Brain Wo Contrast  09/27/2015  CLINICAL DATA:  Posterior headache and worsening right leg weakness beginning 2 days ago. 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 EXAM: MRI HEAD WITHOUT CONTRAST TECHNIQUE: Multiplanar, multiecho pulse sequences of the brain and surrounding structures were obtained without intravenous contrast. COMPARISON:  Head CT 09/27/2015 and MRI 07/15/2014 FINDINGS: There is a 4 mm focus of mildly restricted diffusion involving right temporoparietal white matter adjacent to the lateral ventricle consistent with acute infarct. There is also a 9 mm region of abnormal trace diffusion signal in the posterior left corona radiata with normal to subtly diminished ADC, also likely reflecting an acute or early subacute infarct given patient's worsening right leg weakness. No associated hemorrhage. No mass,  midline shift, or extra-axial fluid collection. Global cerebral atrophy is unchanged from the prior MRI and mildly advanced for age. There is a chronic left occipital PCA territory infarct which was acute on the prior MRI. Patchy T2 hyperintensities elsewhere in the cerebral white matter bilaterally are unchanged and compatible with moderately advanced chronic small vessel ischemic disease for age. Chronic deep cerebral white matter/ basal ganglia and thalamic lacunar infarcts are noted bilaterally. A large area of encephalomalacia is again seen involving the right cerebellum related to prior hemorrhage with ex vacuo dilatation of the fourth ventricle. Sequelae of suboccipital craniectomy are again identified. There are also small chronic infarcts in the right pons. Orbits are unremarkable. 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 Paranasal sinuses and mastoid air cells are clear. Major intracranial vascular flow voids are preserved. IMPRESSION: 1. Small, acute to early subacute infarcts in the left corona radiata and right temporoparietal white matter. 2. Moderate chronic small vessel ischemic disease. 3. Chronic left PCA infarct. 4. Chronic cerebellar encephalomalacia. Electronically Signed   By: Sebastian Ache M.D.   On: 09/27/2015 19:24    EKG: Independently reviewed. Sinus rhythm similar to previous tracings  Assessment/Plan CVA with worsening left-sided leg weakness:Acute/Subacute. Patient with complaints of worsening left lower leg weakness. Initial CT of the brain negative and subsequent MRI of the brain showed a small acute to early subacute infarct in the left corona radiata and the right temporoparietal white matter - Admit to telemetry bed at [facility] - Check hemoglobin A1c and lipid panel in a.m. - Checking echocardiogram and Carotid vascular duplex ultrasound - PT/OT to  eval and treat - Continue ASA - Social work consult  - Neurology consulted, will follow-up any further recommendations   Essential hypertension: Uncontrolledat this time allowing for permissive hypertension. - Continue lisinopril  History of CVA and hemorrhagic stroke: Patient left with residual weakness and right-sided peripheral visual field deficit.  Coronary artery disease s/p PCI - Continue aspirin  GERD - Pharmacy substitution of Protonix   chronic back pain - Continue trazodone and oxycodone prn  Low MCV/MCH - Check iron studies in a.m.   DVT prophylaxis: lovenox  Code Status:  Full Family Communication: None Disposition Plan:  Possible discharge home in 2-3 days Consults called:  Neurology Admission status: Inpatient telemetry  Clydie Braun MD [facility] Pager [phone]  If 7PM-7AM, please contact night-coverage [email or web address] Password [email or web address]  09/27/2015, 8:39 PM

## 2015-09-27 NOTE — ED Notes (Signed)
MD at bedside. 

## 2015-09-27 NOTE — Consult Note (Signed)
Admission H&P    Chief Complaint: Acute worsening of left lower extremity weakness.  HPI: George Villa is an 50 y.o. male with a history of hypertension, previous strokes, including cerebellar hemorrhage with hydrocephalus, requiring surgical evacuation, coronary artery disease and chronic low back pain, presenting with worsening of weakness involving his left lower extremity starting on 09/24/2015. He has residual weakness from previous stroke and routinely ambulates with a cane. Patient has been taking aspirin daily. CT scan of his head was unremarkable. MRI of his brain showed acute to early subacute right parietal temporal and left corona radiata small ischemic infarctions. NIH stroke score was 3.  LSN: 09/24/2015 tPA Given: No: Beyond time under for treatment consideration mRankin:  Past Medical History  Diagnosis Date  . Hypertension   . 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  . Acid reflux   . Headache     "monthly" (02/18/2014)  . Stroke [facility]) 2006    "left leg weak since" (02/18/2014)  . Chronic lower back pain   . 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 Back surgery    . Brain hematoma evacuation  2006  . Ventriculostomy  2006  . Lumbar disc surgery  2013  . Tracheostomy  05/2004  . Left heart catheterization with coronary angiogram N/A 02/17/2014    Procedure: LEFT HEART CATHETERIZATION WITH CORONARY ANGIOGRAM;  Surgeon: Sinclair Grooms, MD;  Location: [facility] CATH LAB;  Service: Cardiovascular;  Laterality: N/A;  . Percutaneous coronary stent intervention (pci-s)  02/17/2014    Procedure: PERCUTANEOUS CORONARY STENT INTERVENTION (PCI-S);  Surgeon: Sinclair Grooms, MD;  Location: [facility] CATH LAB;  Service: Cardiovascular;;    Family History  Problem Relation Age of Onset  . Stroke    . Heart Problems Father   . Hypertension Mother     Social History:  reports that he has never smoked. He has never used smokeless tobacco. He reports that he drinks about 3.6 oz of alcohol per week. He reports that he does not use illicit drugs.  Allergies: No Known Allergies  Medications: Preadmission medications were reviewed by me.  ROS: History obtained from the patient  General ROS: negative for - chills, fatigue, fever, night sweats, weight gain or weight loss Psychological ROS: negative for - behavioral disorder, hallucinations, memory difficulties, mood swings or suicidal ideation Ophthalmic ROS: negative for - blurry vision, double vision, eye pain or loss of vision ENT ROS: negative for - epistaxis, nasal discharge, oral lesions, sore throat, tinnitus or vertigo Allergy and Immunology ROS: negative for - hives or itchy/watery eyes Hematological and Lymphatic ROS: negative for - bleeding problems, bruising or swollen lymph nodes Endocrine ROS: negative for - galactorrhea, hair pattern changes, polydipsia/polyuria or temperature intolerance Respiratory ROS: negative for - cough, hemoptysis, shortness of breath or wheezing Cardiovascular ROS: negative for - chest pain, dyspnea on exertion, edema or irregular heartbeat Gastrointestinal ROS: negative for - abdominal pain, diarrhea, hematemesis, nausea/vomiting or stool incontinence Genito-Urinary ROS: negative for - dysuria, hematuria, incontinence or urinary frequency/urgency Musculoskeletal ROS: negative for - joint swelling or muscular weakness Neurological ROS: as noted in HPI Dermatological ROS: negative for rash and skin lesion changes  Physical Examination: Blood pressure 141/97, pulse 78, temperature 97.9 F (36.6 C), temperature source Oral, resp. rate 16, SpO2 98 %.  HEENT-  Normocephalic, no lesions, without obvious abnormality.  Normal external eye and conjunctiva.  Normal TM's bilaterally.  Normal auditory canals and external ears. Normal external nose, mucus  membranes and septum.  Normal pharynx. Neck supple with no masses, nodes, nodules or enlargement. Cardiovascular - regular rate and rhythm, S1, S2 normal, no murmur, click, rub or gallop Lungs - chest clear, no wheezing, rales, normal symmetric air entry Abdomen - soft, non-tender; bowel sounds normal; no masses,  no organomegaly Extremities - no joint deformities, effusion, or inflammation and no edema  Neurologic Examination: Mental Status: Alert, oriented, no acute distress.  Speech fluent without evidence of aphasia. Able to follow commands without difficulty. Cranial Nerves: II-Visual fields were normal. III/IV/VI-Pupils were equal and reacted normally to light. Extraocular movements were full and conjugate.    V/VII-no facial numbness; equivocal mild right lower facial weakness. VIII-normal. X-normal speech and symmetrical palatal movement. XI: trapezius strength/neck flexion strength normal bilaterally XII-midline tongue extension with normal strength. Motor: Slight drift of left lower extremity, motor exam otherwise unremarkable. Sensory: Normal throughout. Deep Tendon Reflexes: 2+ and asymmetric, left greater than right. Plantars: Flexor on the right and extensor on the left. Cerebellar: Mild impairment of coordination of left upper extremity. Carotid auscultation: Normal  Results for orders placed or performed during the hospital encounter of 09/27/15 (from the past 48 hour(s))  Urine rapid drug screen (hosp performed)not at [facility]     Status: None   Collection Time: 09/27/15  3:51 PM  Result Value Ref Range   Opiates NONE DETECTED NONE DETECTED   Cocaine NONE DETECTED NONE DETECTED   Benzodiazepines NONE DETECTED NONE DETECTED   Amphetamines NONE DETECTED NONE DETECTED   Tetrahydrocannabinol NONE DETECTED NONE DETECTED   Barbiturates NONE DETECTED NONE DETECTED    Comment:        DRUG SCREEN FOR MEDICAL PURPOSES ONLY.  IF CONFIRMATION IS NEEDED FOR ANY PURPOSE, NOTIFY  LAB WITHIN 5 DAYS.        LOWEST DETECTABLE LIMITS FOR URINE DRUG SCREEN Drug Class       Cutoff (ng/mL) Amphetamine      1000 Barbiturate      200 Benzodiazepine   709 Tricyclics       628 Opiates          300 Cocaine          300 THC              50   Urinalysis, Routine w reflex microscopic (not at [facility])     Status: None   Collection Time: 09/27/15  3:51 PM  Result Value Ref Range   Color, Urine YELLOW YELLOW   APPearance CLEAR CLEAR   Specific Gravity, Urine 1.023 1.005 - 1.030   pH 7.0 5.0 - 8.0   Glucose, UA NEGATIVE NEGATIVE mg/dL   Hgb urine dipstick NEGATIVE NEGATIVE   Bilirubin Urine NEGATIVE NEGATIVE   Ketones, ur NEGATIVE NEGATIVE mg/dL   Protein, ur NEGATIVE NEGATIVE mg/dL   Nitrite NEGATIVE NEGATIVE   Leukocytes, UA NEGATIVE NEGATIVE    Comment: MICROSCOPIC NOT DONE ON URINES WITH NEGATIVE PROTEIN, BLOOD, LEUKOCYTES, NITRITE, OR GLUCOSE <1000 mg/dL.  Ethanol     Status: None   Collection Time: 09/27/15  3:58 PM  Result Value Ref Range   Alcohol, Ethyl (B) <5 <5 mg/dL    Comment:        LOWEST DETECTABLE LIMIT FOR SERUM ALCOHOL IS 5 mg/dL FOR MEDICAL PURPOSES ONLY   Protime-INR     Status: None   Collection Time: 09/27/15  3:58 PM  Result Value Ref Range   Prothrombin Time 13.9 11.6 - 15.2 seconds   INR 1.05 0.00 - 1.49  APTT     Status: None   Collection Time: 09/27/15  3:58 PM  Result Value Ref Range   aPTT 29 24 - 37 seconds  CBC     Status: Abnormal   Collection Time: 09/27/15  3:58 PM  Result Value Ref Range   WBC 10.5 4.0 - 10.5 K/uL   RBC 5.59 4.22 - 5.81 MIL/uL   Hemoglobin 14.3 13.0 - 17.0 g/dL   HCT 43.3 39.0 - 52.0 %   MCV 77.5 (L) 78.0 - 100.0 fL   MCH 25.6 (L) 26.0 - 34.0 pg   MCHC 33.0 30.0 - 36.0 g/dL   RDW 14.6 11.5 - 15.5 %   Platelets 268 150 - 400 K/uL  Differential     Status: Abnormal   Collection Time: 09/27/15  3:58 PM  Result Value Ref Range   Neutrophils Relative % 77 %   Neutro Abs 8.0 (H) 1.7 - 7.7 K/uL    Lymphocytes Relative 11 %   Lymphs Abs 1.2 0.7 - 4.0 K/uL   Monocytes Relative 10 %   Monocytes Absolute 1.0 0.1 - 1.0 K/uL   Eosinophils Relative 2 %   Eosinophils Absolute 0.2 0.0 - 0.7 K/uL   Basophils Relative 0 %   Basophils Absolute 0.0 0.0 - 0.1 K/uL  Comprehensive metabolic panel     Status: Abnormal   Collection Time: 09/27/15  3:58 PM  Result Value Ref Range   Sodium 140 135 - 145 mmol/L   Potassium 3.8 3.5 - 5.1 mmol/L   Chloride 111 101 - 111 mmol/L   CO2 22 22 - 32 mmol/L   Glucose, Bld 90 65 - 99 mg/dL   BUN 17 6 - 20 mg/dL   Creatinine, Ser 1.11 0.61 - 1.24 mg/dL   Calcium 8.8 (L) 8.9 - 10.3 mg/dL   Total Protein 7.1 6.5 - 8.1 g/dL   Albumin 3.7 3.5 - 5.0 g/dL   AST 19 15 - 41 U/L   ALT 21 17 - 63 U/L   Alkaline Phosphatase 58 38 - 126 U/L   Total Bilirubin 0.7 0.3 - 1.2 mg/dL   GFR calc non Af Amer >60 >60 mL/min   GFR calc Af Amer >60 >60 mL/min    Comment: (NOTE) The eGFR has been calculated using the CKD EPI equation. This calculation has not been validated in all clinical situations. eGFR's persistently <60 mL/min signify possible Chronic Kidney Disease.    Anion gap 7 5 - 15  I-stat troponin, ED (not at [facility])     Status: None   Collection Time: 09/27/15  4:08 PM  Result Value Ref Range   Troponin i, poc 0.01 0.00 - 0.08 ng/mL   Comment 3            Comment: Due to the release kinetics of cTnI, a negative result within the first hours of the onset of symptoms does not rule out myocardial infarction with certainty. If myocardial infarction is still suspected, repeat the test at appropriate intervals.   I-Stat Chem 8, ED  (not at [facility])     Status: None   Collection Time: 09/27/15  4:09 PM  Result Value Ref Range   Sodium 143 135 - 145 mmol/L   Potassium 3.9 3.5 - 5.1 mmol/L   Chloride 107 101 - 111 mmol/L   BUN 16 6 -  20 mg/dL   Creatinine, Ser 1.20 0.61 - 1.24 mg/dL   Glucose, Bld 88 65 - 99 mg/dL   Calcium, Ion 1.22 1.12 - 1.23  mmol/L   TCO2 22 0 - 100 mmol/L   Hemoglobin 15.0 13.0 - 17.0 g/dL   HCT 44.0 39.0 - 52.0 %   Dg Chest 2 View  09/27/2015  CLINICAL DATA:  CEREBRAL VASCULAR ACCIDENT. EXAM: CHEST  2 VIEW COMPARISON:  06/19/2004 FINDINGS: Normal mediastinum and cardiac silhouette. Rounded density over the LEFT cardiophrenic angle measuring 4 cm in diameter. Normal pulmonary vasculature. No evidence of effusion, infiltrate, or pneumothorax. No acute bony abnormality. IMPRESSION: No active cardiopulmonary disease. Rounded density over the LEFT cardiophrenic angle with differential including hiatal hernia or aortic aneurysm. Electronically Signed   By: Suzy Bouchard M.D.   On: 09/27/2015 21:35   Ct Head Wo Contrast  09/27/2015  CLINICAL DATA:  Leg weakness and pain along the back of the head starting 2 days ago. Remote stroke. EXAM: CT HEAD WITHOUT CONTRAST TECHNIQUE: Contiguous axial images were obtained from the base of the skull through the vertex without intravenous contrast. COMPARISON:  07/15/2014 CT and MRI exams FINDINGS: Encephalomalacia in the right cerebellum with prior suboccipital craniectomy. Remote left posterior cerebral artery distribution infarct. Remote lacunar infarcts in the right external capsule and right periventricular white matter. Periventricular white matter and corona radiata hypodensities favor chronic ischemic microvascular white matter disease. No intracranial hemorrhage, mass lesion, or acute CVA. IMPRESSION: 1. No acute intracranial findings. 2. Right cerebellar encephalomalacia with prior left PCA distribution infarct and remote lacunar infarcts in the right external capsule and periventricular white matter. 3. Periventricular white matter and corona radiata hypodensities favor chronic ischemic microvascular white matter disease. 4. Prior suboccipital craniectomy. Electronically Signed   By: Van Clines M.D.   On: 09/27/2015 15:40   Mr Brain Wo Contrast  09/27/2015  CLINICAL DATA:   Posterior headache and worsening right leg weakness beginning 2 days ago. 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 EXAM: MRI HEAD WITHOUT CONTRAST TECHNIQUE: Multiplanar, multiecho pulse sequences of the brain and surrounding structures were obtained without intravenous contrast. COMPARISON:  Head CT 09/27/2015 and MRI 07/15/2014 FINDINGS: There is a 4 mm focus of mildly restricted diffusion involving right temporoparietal white matter adjacent to the lateral ventricle consistent with acute infarct. There is also a 9 mm region of abnormal trace diffusion signal in the posterior left corona radiata with normal to subtly diminished ADC, also likely reflecting an acute or early subacute infarct given patient's worsening right leg weakness. No associated hemorrhage. No mass, midline shift, or extra-axial fluid collection. Global cerebral atrophy is unchanged from the prior MRI and mildly advanced for age. There is a chronic left occipital PCA territory infarct which was acute on the prior MRI. Patchy T2 hyperintensities elsewhere in the cerebral white matter bilaterally are unchanged and compatible with moderately advanced chronic small vessel ischemic disease for age. Chronic deep cerebral white matter/ basal ganglia and thalamic lacunar infarcts are noted bilaterally. A large area of encephalomalacia is again seen involving the right cerebellum related to prior hemorrhage with ex vacuo dilatation of the fourth ventricle. Sequelae of suboccipital craniectomy are again identified. There are also small chronic infarcts in the right pons. Orbits are unremarkable. 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 Paranasal sinuses  and mastoid air cells are clear. Major intracranial vascular flow voids are preserved. IMPRESSION: 1. Small, acute to early  subacute infarcts in the left corona radiata and right temporoparietal white matter. 2. Moderate chronic small vessel ischemic disease. 3. Chronic left PCA infarct. 4. Chronic cerebellar encephalomalacia. Electronically Signed   By: Logan Bores M.D.   On: 09/27/2015 19:24    Assessment: 50 y.o. male with multiple risk factors for stroke as well as previous strokes, including cerebral hemorrhage, presenting with acute bilateral subcortical small ischemic infarctions as described above.  Stroke Risk Factors - family history and hypertension  Plan: 1. HgbA1c, fasting lipid panel 2. MRA  of the brain without contrast 3. PT consult, OT consult 4. Echocardiogram 5. Carotid dopplers 6. Prophylactic therapy-Antiplatelet med: Aspirin  7. Risk factor modification 8. Telemetry monitoring  C.R. Nicole Kindred, MD [facility] [phone]  09/27/2015, 9:54 PM

## 2015-09-27 NOTE — ED Notes (Signed)
Report given to Earvin HansenSara, 5C, [facility].

## 2015-09-28 ENCOUNTER — Other Ambulatory Visit: Payer: Self-pay | Admitting: Neurology

## 2015-09-28 ENCOUNTER — Inpatient Hospital Stay (HOSPITAL_COMMUNITY): Payer: Medicaid Other

## 2015-09-28 ENCOUNTER — Encounter (HOSPITAL_COMMUNITY): Payer: Self-pay | Admitting: Radiology

## 2015-09-28 DIAGNOSIS — I6330 Cerebral infarction due to thrombosis of unspecified cerebral artery: Secondary | ICD-10-CM

## 2015-09-28 DIAGNOSIS — M549 Dorsalgia, unspecified: Secondary | ICD-10-CM

## 2015-09-28 DIAGNOSIS — E785 Hyperlipidemia, unspecified: Secondary | ICD-10-CM

## 2015-09-28 DIAGNOSIS — I6789 Other cerebrovascular disease: Secondary | ICD-10-CM

## 2015-09-28 DIAGNOSIS — Z8673 Personal history of transient ischemic attack (TIA), and cerebral infarction without residual deficits: Secondary | ICD-10-CM | POA: Insufficient documentation

## 2015-09-28 DIAGNOSIS — H53461 Homonymous bilateral field defects, right side: Secondary | ICD-10-CM | POA: Insufficient documentation

## 2015-09-28 DIAGNOSIS — G8929 Other chronic pain: Secondary | ICD-10-CM | POA: Diagnosis present

## 2015-09-28 LAB — ECHOCARDIOGRAM COMPLETE
CHL CUP MV DEC (S): 197
E decel time: 197 msec
E/e' ratio: 11
FS: 31 % (ref 28–44)
HEIGHTINCHES: 67 in
IV/PV OW: 1.37
LA vol index: 11.2 mL/m2
LADIAMINDEX: 1.78 cm/m2
LASIZE: 36 mm
LAVOL: 22.7 mL
LAVOLA4C: 21.5 mL
LDCA: 2.54 cm2
LEFT ATRIUM END SYS DIAM: 36 mm
LV E/e' medial: 11
LV E/e'average: 11
LV PW d: 12.4 mm — AB (ref 0.6–1.1)
LVELAT: 8.38 cm/s
LVOT diameter: 18 mm
MV Peak grad: 3 mmHg
MV pk E vel: 92.2 m/s
MVPKAVEL: 79.5 m/s
TAPSE: 17.6 mm
TDI e' lateral: 8.38
TDI e' medial: 6.2
WEIGHTICAEL: 3200 [oz_av]

## 2015-09-28 LAB — CBC
HCT: 42.5 % (ref 39.0–52.0)
HEMOGLOBIN: 13.6 g/dL (ref 13.0–17.0)
MCH: 24.9 pg — ABNORMAL LOW (ref 26.0–34.0)
MCHC: 32 g/dL (ref 30.0–36.0)
MCV: 77.7 fL — ABNORMAL LOW (ref 78.0–100.0)
Platelets: 224 10*3/uL (ref 150–400)
RBC: 5.47 MIL/uL (ref 4.22–5.81)
RDW: 14.4 % (ref 11.5–15.5)
WBC: 5.3 10*3/uL (ref 4.0–10.5)

## 2015-09-28 LAB — FOLATE: Folate: 12.2 ng/mL (ref >5.9)

## 2015-09-28 LAB — RPR: RPR: NONREACTIVE

## 2015-09-28 LAB — BASIC METABOLIC PANEL
Anion gap: 7 (ref 5–15)
BUN: 14 mg/dL (ref 6–20)
CHLORIDE: 112 mmol/L — AB (ref 101–111)
CO2: 20 mmol/L — ABNORMAL LOW (ref 22–32)
Calcium: 8.5 mg/dL — ABNORMAL LOW (ref 8.9–10.3)
Creatinine, Ser: 1.15 mg/dL (ref 0.61–1.24)
GFR calc non Af Amer: >60 mL/min (ref >60)
Glucose, Bld: 96 mg/dL (ref 65–99)
POTASSIUM: 4 mmol/L (ref 3.5–5.1)
SODIUM: 139 mmol/L (ref 135–145)

## 2015-09-28 LAB — LIPID PANEL
CHOL/HDL RATIO: 5.2 ratio
CHOLESTEROL: 161 mg/dL (ref 0–200)
HDL: 31 mg/dL — ABNORMAL LOW (ref >40)
LDL Cholesterol: 110 mg/dL — ABNORMAL HIGH (ref 0–99)
TRIGLYCERIDES: 102 mg/dL (ref <150)
VLDL: 20 mg/dL (ref 0–40)

## 2015-09-28 LAB — TSH: TSH: 0.99 u[IU]/mL (ref 0.350–4.500)

## 2015-09-28 LAB — VITAMIN B12: Vitamin B-12: 231 pg/mL (ref 180–914)

## 2015-09-28 LAB — HIV ANTIBODY (ROUTINE TESTING W REFLEX): HIV Screen 4th Generation wRfx: NONREACTIVE

## 2015-09-28 MED ORDER — CLOPIDOGREL BISULFATE 75 MG PO TABS
75.0000 mg | ORAL_TABLET | Freq: Every day | ORAL | Status: DC
  Administered 2015-09-28: 75 mg via ORAL
  Filled 2015-09-28: qty 1

## 2015-09-28 MED ORDER — ATORVASTATIN CALCIUM 40 MG PO TABS: 40.0000 mg | ORAL_TABLET | Freq: Every day | ORAL | Status: DC

## 2015-09-28 MED ORDER — IOPAMIDOL (ISOVUE-370) INJECTION 76%
INTRAVENOUS | Status: AC
  Administered 2015-09-28: 50 mL
  Filled 2015-09-28: qty 50

## 2015-09-28 MED ORDER — CLOPIDOGREL BISULFATE 75 MG PO TABS: 75.0000 mg | ORAL_TABLET | Freq: Every day | ORAL | Status: AC

## 2015-09-28 NOTE — Evaluation (Signed)
Physical Therapy Evaluation Patient Details Name: George Villa MRN: [id number] DOB: [birth year] Today's Date: 09/28/2015   History of Present Illness  Patient is a 50 y/o male with hx of CVAs with residual leg weakness and right sided peripheral vision loss, intracranial hemorrhage s/p evacuation, CAD s/p PCI, and chronic back pain presents with complaints of worsening left leg weakness and headache for the last 3 days. MRI-Small, acute to early subacute infarcts in the left corona radiata and right temporoparietal white matter.   Clinical Impression  Patient presents with residual left sided weakness/deficits from prior CVA and balance deficits s/p above impacting mobility. Pt slightly impulsive during mobility. Tolerated gait training with Min A for safety/balance due to slipping of SPC as rubber stopper is broken. Pt is a fall risk due to broken cane and will need a new one ASAP to prevent falls. Pt is independent, drives and works [employer]. Will follow acutely to maximize independence and mobility prior to return home. Anticipate with new cane, balance and mobility will improve.     Follow Up Recommendations No PT follow up;Supervision - Intermittent    Equipment Recommendations  Cane    Recommendations for Other Services OT consult     Precautions / Restrictions Precautions Precautions: Fall Restrictions Weight Bearing Restrictions: No      Mobility  Bed Mobility Overal bed mobility: Needs Assistance Bed Mobility: Supine to Sit     Supine to sit: Min guard;HOB elevated     General bed mobility comments: Some difficulty getting to EOB but able to perform without assist.   Transfers Overall transfer level: Needs assistance Equipment used: Straight cane Transfers: Sit to/from Stand Sit to Stand: Min guard         General transfer comment: Min guard for safety with pt reaching for IV pole for support.   Ambulation/Gait Ambulation/Gait assistance: Min  assist Ambulation Distance (Feet): 150 Feet Assistive device: Straight cane Gait Pattern/deviations: Step-through pattern;Decreased dorsiflexion - left;Decreased step length - left     General Gait Details: Unsteady gait with decreased hip/knee flexion and decreased arm swing LUE. The rubber stopper on cane is broken so pt's cane slipping on floor resulting in almost LOB and staggering.   Stairs            Wheelchair Mobility    Modified Rankin (Stroke Patients Only) Modified Rankin (Stroke Patients Only) Pre-Morbid Rankin Score: No significant disability Modified Rankin: Slight disability     Balance Overall balance assessment: Needs assistance Sitting-balance support: Feet supported;No upper extremity supported Sitting balance-Leahy Scale: Good Sitting balance - Comments: Able to donn socks without difficulty.   Standing balance support: During functional activity Standing balance-Leahy Scale: Fair Standing balance comment: Requires UE support during standing.                             Pertinent Vitals/Pain Pain Assessment: No/denies pain    Home Living Family/patient expects to be discharged to:: Private residence Living Arrangements: Alone Available Help at Discharge: Family;Available PRN/intermittently Type of Home: Apartment Home Access: Level entry     Home Layout: One level Home Equipment: Cane - single point      Prior Function Level of Independence: Independent with assistive device(s)         Comments: Has been using SPC since 2015 after stroke. Works Fish farm managerrepairing jewelry, drives.      Hand Dominance   Dominant Hand: Right    Extremity/Trunk Assessment  Upper Extremity Assessment: Defer to OT evaluation (Residual weakness LUE from prior CVA)           Lower Extremity Assessment: LLE deficits/detail;Generalized weakness   LLE Deficits / Details: Grossly ~2+/5 knee extension, 2+/5 knee flexion, DF/PF.     Communication    Communication: No difficulties  Cognition Arousal/Alertness: Awake/alert Behavior During Therapy: WFL for tasks assessed/performed Overall Cognitive Status: Within Functional Limits for tasks assessed                      General Comments      Exercises        Assessment/Plan    PT Assessment Patient needs continued PT services  PT Diagnosis Abnormality of gait   PT Problem List Decreased strength;Decreased range of motion;Decreased activity tolerance;Decreased balance;Decreased mobility;Decreased safety awareness;Decreased knowledge of precautions  PT Treatment Interventions Balance training;Gait training;Functional mobility training;Therapeutic activities;Therapeutic exercise;Patient/family education;Neuromuscular re-education   PT Goals (Current goals can be found in the Care Plan section) Acute Rehab PT Goals Patient Stated Goal: to get home today PT Goal Formulation: With patient Time For Goal Achievement: 10/12/15 Potential to Achieve Goals: Good    Frequency Min 3X/week   Barriers to discharge Decreased caregiver support lives alone    Co-evaluation               End of Session Equipment Utilized During Treatment: Gait belt Activity Tolerance: Patient tolerated treatment well Patient left: in chair;with call bell/phone within reach;with chair alarm set Nurse Communication: Mobility status         Time: 1610-9604 PT Time Calculation (min) (ACUTE ONLY): 21 min   Charges:   PT Evaluation $PT Eval Moderate Complexity: 1 Procedure     PT G Codes:        Daisi Kentner A Emerie Vanderkolk 09/28/2015, 10:31 AM Mylo Red, PT, DPT [phone]

## 2015-09-28 NOTE — Progress Notes (Signed)
  Echocardiogram 2D Echocardiogram has been performed.  Leta JunglingCooper, Mamie Hundertmark M 09/28/2015, 2:11 PM

## 2015-09-28 NOTE — Discharge Summary (Addendum)
Physician Discharge Summary  NASH BOLLS EAV:[id number] DOB: 1965-11-12 DOA: 09/27/2015  PCP: Lahoma Crocker, MD  Admit date: 09/27/2015 Discharge date: 09/28/2015  Admitted From: Home Disposition:  Home Recommendations for Outpatient Follow-up:  1. Follow up with PCP in 1-2 weeks 2. Please obtain venous duplex of lower extremities  Home Health:No Equipment/Devices:No  Discharge Condition:stable CODE STATUS:FULL Diet recommendation: Heart Healthy   Brief/Interim Summary: 50 y.o. 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 Patient ambulates with a cane at baseline, but had noted more difficulty ambulating since onset of the symptoms. He reports stumbling more than usual. The patient was noted to have an MRI that showed acute to early subacute infarct in the left corona radiata and right temporoparietal area.  Neurology was consulted and full stroke work up was undertaken. ADD and is the hospitalist Dr. Kenyon Ana man I am I was looking at [facility] note from earlier on whenever patient Brenda Cowher he is a guy with the early subacute left corona radiata Discharge Diagnoses:  Acute to early subacute left corona radiata and right temporoparietal infarct  CTA H&N no emergeny large vessel occlusion. New L PCA occlusion since 2016. Mod to severe intracranial atherosclerosis.   MRI left corona radiata and right temporoparietal white matter infarcts   MRA No large vessel stenosis  2D Echo EF 55-60%. Severe LVH. No source of embolus   LDL 110--home with atorvastatin 40 mg daily  HgbA1c 5.9 in July 15, 2015  aspirin 81 mg daily and clopidogrel 75 mg daily  PT--no follow up needed;  HHOT set up to eval safety of driving  with hemanianopsia  Case discussed with Dr. Jerel Shepherd on day of d/c and he cleared pt for d/c and to have pt f/u with Dr. Pearlean Brownie to have duplex of LE Essential hypertension:  - Continue lisinopril after d/c -allowed for permissive HTN during hospitalization  History of CVA and hemorrhagic stroke: Patient left with residual weakness and right-sided peripheral visual field deficit. -per stroke team  Coronary artery disease s/p PCI - Continue aspirin 81 mg daily--plavix added during the hospitalization   GERD - Pharmacy substitution of Protonix  chronic back pain - Continue trazodone and oxycodone prn  Impaired glucose tolerance -A1C 5.9 on 07/15/15 -lifestyle modification -TSH 0.990   Discharge Instructions      Discharge Instructions    Diet - low sodium heart healthy    Complete by:  As directed      Increase activity slowly    Complete by:  As directed             Medication List    TAKE these medications        atorvastatin 40 MG tablet  Commonly known as:  LIPITOR  Take 1 tablet (40 mg total) by mouth daily at 6 PM.     clopidogrel 75 MG tablet  Commonly known as:  PLAVIX  Take 1 tablet (75 mg total) by mouth daily.     lisinopril 20 MG tablet  Commonly known as:  PRINIVIL,ZESTRIL  Take 20 mg by mouth daily.     nitroGLYCERIN 0.4 MG SL tablet  Commonly known as:  NITROSTAT  Place 1 tablet (0.4 mg total) under the tongue every 5 (five) minutes as needed for chest  pain.     omeprazole 20 MG capsule  Commonly known as:  PRILOSEC  Take 20 mg by mouth.     oxyCODONE-acetaminophen 10-325 MG tablet  Commonly known as:  PERCOCET  take 1 tablet 4 times daily as needed for pain     RA ASPIRIN EC 81 MG EC tablet  Generic drug:  aspirin  Take 81 mg by mouth daily.     traZODone 50 MG tablet  Commonly known as:  DESYREL  Take 50 mg by mouth at bedtime.        No Known Allergies  Consultations:  Neurology   Procedures/Studies: Ct Angio Head W Or Wo  Contrast  09/28/2015  CLINICAL DATA:  50 year old male with small bilateral white matter infarcts detected on MRI for headache and right lower extremity weakness. Personal history of cerebellar and left PCA infarcts. Initial encounter. EXAM: CT ANGIOGRAPHY HEAD AND NECK TECHNIQUE: Multidetector CT imaging of the head and neck was performed using the standard protocol during bolus administration of intravenous contrast. Multiplanar CT image reconstructions and MIPs were obtained to evaluate the vascular anatomy. Carotid stenosis measurements (when applicable) are obtained utilizing NASCET criteria, using the distal internal carotid diameter as the denominator. CONTRAST:  50 mL Isovue 370 COMPARISON:  Intracranial MRA 09/28/2015, and head and neck MRA 07/15/2014. Head CT without contrast 09/27/2015. FINDINGS: CTA NECK Skeleton: Absent maxillary dentition. 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 Previous suboccipital craniectomy. No acute osseous abnormality identified. Stable and well pneumatized visualized paranasal sinuses and mastoids. Other neck: Mediastinal lipomatosis. No upper thoracic lymphadenopathy. Negative lung apices. Negative thyroid. Mild deformity of the subglottic trachea probably is related to previous tracheostomy. No airway narrowing. Negative larynx, pharynx, parapharyngeal spaces, retropharyngeal space, sublingual space, submandibular glands and parotid glands. Negative orbit soft tissues. No acute scalp soft tissue findings. No cervical lymphadenopathy. Aortic arch: 3 vessel arch configuration. Mild soft atherosclerotic plaque of the arch with no great vessel origin stenosis. Right carotid system: Mild soft plaque in the right CCA without stenosis. Soft and calcified plaque at the right carotid bifurcation, but no subsequent stenosis. Left carotid system: Mild soft plaque of the left CCA. Mild tortuosity of the proximal left CCA. Unusual anterior distal left CCA  thyroidal branch. Soft plaque at the left carotid bifurcation, but no associated left ICA stenosis. Vertebral arteries: No proximal subclavian artery stenosis. Normal vertebral artery origins. However, there is high-grade stenosis of the right vertebral artery V1 segment related to soft plaque as seen on series 8, image 121. No proximal left vertebral artery stenosis. The right vertebral remains patent, but the left vertebral is dominant throughout the neck. No other vertebral artery stenosis in the neck. CTA HEAD Posterior circulation: Calcified plaque in both distal vertebral arteries. On the left this is distal to the normal left PICA origin. There is subsequent moderate stenosis of the left V4 segment. On the right there is multifocal calcified plaque with moderate to severe right V4 segment stenosis (series 7, image 157 and series 8, image 97). Despite this the vertebrobasilar junction is patent. There is basilar artery irregularity with moderate proximal basilar stenosis best seen on series 11, image 24. This appears stable since 2016. The basilar remains patent. There is distal basilar artery irregularity with mild stenosis just proximal to the SCA origins (series 11, image 23). Fetal type left PCA origin with mildly irregular left posterior communicating artery. Diminutive right posterior communicating artery. The right PCA origin is mildly irregular and stenotic.  Multifocal high-grade stenosis in the right PCA P2 and proximal P3 segments as seen on series 12, image 17. The left PCA is occluded in the P2 segment which is new since 2016. Anterior circulation: Both ICA siphons are patent, the left is somewhat dolichoectatic but with only minimal calcified plaque and no stenosis. There is intermittent calcified plaque in the right siphon with mild stenosis at the right anterior genu. Ophthalmic and posterior communicating artery origins are normal. Carotid termini are patent. The right ACA A1 segment is  diminutive or absent. Normal left ACA origin. Anterior communicating artery is normal. There is widespread mild bilateral ACA A2 and distal irregularity. Left MCA origin, M1 segment, bifurcation, and left MCA branches are within normal limits. Right MCA origin, M1 segment, bifurcation, and right MCA branches are within normal limits. Venous sinuses: Patent. Anatomic variants: Fetal type left PCA origin. Dominant left ACA A1 and diminutive or absent right ACA A1. Dominant appearing left vertebral artery and left ICA siphon. Delayed phase: Chronic left PCA infarct. Patchy white matter and deep gray matter hypodensity appears stable. Chronic right cerebellar infarct. Stable gray-white matter differentiation throughout the brain. No abnormal enhancement identified. IMPRESSION: 1. No emergent large vessel occlusion. Occlusion of the left PCA is new since the 2016 MRA but probably occurred in association with the left PCA infarct at that time. 2. Moderate to severe atherosclerosis affecting the posterior circulation. Moderate or severe stenoses of the: - Right vertebral artery V1 and V4 segments, - Left vertebral artery V4 segment, - mid Basilar artery, - Right PCA P2 and P3 segments. 3. Comparatively mild anterior circulation atherosclerosis, with no carotid bifurcation stenosis despite atherosclerotic plaque. There is mild atherosclerotic stenosis of the right ICA siphon due to calcified plaque, and widespread mild ACA branch irregularity which appears likely due to atherosclerosis. 4. Stable CT appearance of the brain since 09/27/2015. Electronically Signed   By: Odessa Fleming M.D.   On: 09/28/2015 11:05   Dg Chest 2 View  09/27/2015  CLINICAL DATA:  CEREBRAL VASCULAR ACCIDENT. EXAM: CHEST  2 VIEW COMPARISON:  06/19/2004 FINDINGS: Normal mediastinum and cardiac silhouette. Rounded density over the LEFT cardiophrenic angle measuring 4 cm in diameter. Normal pulmonary vasculature. No evidence of effusion, infiltrate, or  pneumothorax. No acute bony abnormality. IMPRESSION: No active cardiopulmonary disease. Rounded density over the LEFT cardiophrenic angle with differential including hiatal hernia or aortic aneurysm. Electronically Signed   By: Genevive Bi M.D.   On: 09/27/2015 21:35   Ct Head Wo Contrast  09/27/2015  CLINICAL DATA:  Leg weakness and pain along the back of the head starting 2 days ago. Remote stroke. EXAM: CT HEAD WITHOUT CONTRAST TECHNIQUE: Contiguous axial images were obtained from the base of the skull through the vertex without intravenous contrast. COMPARISON:  07/15/2014 CT and MRI exams FINDINGS: Encephalomalacia in the right cerebellum with prior suboccipital craniectomy. Remote left posterior cerebral artery distribution infarct. Remote lacunar infarcts in the right external capsule and right periventricular white matter. Periventricular white matter and corona radiata hypodensities favor chronic ischemic microvascular white matter disease. No intracranial hemorrhage, mass lesion, or acute CVA. IMPRESSION: 1. No acute intracranial findings. 2. Right cerebellar encephalomalacia with prior left PCA distribution infarct and remote lacunar infarcts in the right external capsule and periventricular white matter. 3. Periventricular white matter and corona radiata hypodensities favor chronic ischemic microvascular white matter disease. 4. Prior suboccipital craniectomy. Electronically Signed   By: Gaylyn Rong M.D.   On: 09/27/2015 15:40   Ct  Angio Neck W Or Wo Contrast  09/28/2015  CLINICAL DATA:  50 year old male with small bilateral white matter infarcts detected on MRI for headache and right lower extremity weakness. Personal history of cerebellar and left PCA infarcts. Initial encounter. EXAM: CT ANGIOGRAPHY HEAD AND NECK TECHNIQUE: Multidetector CT imaging of the head and neck was performed using the standard protocol during bolus administration of intravenous contrast. Multiplanar CT image  reconstructions and MIPs were obtained to evaluate the vascular anatomy. Carotid stenosis measurements (when applicable) are obtained utilizing NASCET criteria, using the distal internal carotid diameter as the denominator. CONTRAST:  50 mL Isovue 370 COMPARISON:  Intracranial MRA 09/28/2015, and head and neck MRA 07/15/2014. Head CT without contrast 09/27/2015. FINDINGS: CTA NECK Skeleton: Absent maxillary dentition. 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 Previous suboccipital craniectomy. No acute osseous abnormality identified. Stable and well pneumatized visualized paranasal sinuses and mastoids. Other neck: Mediastinal lipomatosis. No upper thoracic lymphadenopathy. Negative lung apices. Negative thyroid. Mild deformity of the subglottic trachea probably is related to previous tracheostomy. No airway narrowing. Negative larynx, pharynx, parapharyngeal spaces, retropharyngeal space, sublingual space, submandibular glands and parotid glands. Negative orbit soft tissues. No acute scalp soft tissue findings. No cervical lymphadenopathy. Aortic arch: 3 vessel arch configuration. Mild soft atherosclerotic plaque of the arch with no great vessel origin stenosis. Right carotid system: Mild soft plaque in the right CCA without stenosis. Soft and calcified plaque at the right carotid bifurcation, but no subsequent stenosis. Left carotid system: Mild soft plaque of the left CCA. Mild tortuosity of the proximal left CCA. Unusual anterior distal left CCA thyroidal branch. Soft plaque at the left carotid bifurcation, but no associated left ICA stenosis. Vertebral arteries: No proximal subclavian artery stenosis. Normal vertebral artery origins. However, there is high-grade stenosis of the right vertebral artery V1 segment related to soft plaque as seen on series 8, image 121. No proximal left vertebral artery stenosis. The right vertebral remains patent, but the left vertebral is dominant  throughout the neck. No other vertebral artery stenosis in the neck. CTA HEAD Posterior circulation: Calcified plaque in both distal vertebral arteries. On the left this is distal to the normal left PICA origin. There is subsequent moderate stenosis of the left V4 segment. On the right there is multifocal calcified plaque with moderate to severe right V4 segment stenosis (series 7, image 157 and series 8, image 97). Despite this the vertebrobasilar junction is patent. There is basilar artery irregularity with moderate proximal basilar stenosis best seen on series 11, image 24. This appears stable since 2016. The basilar remains patent. There is distal basilar artery irregularity with mild stenosis just proximal to the SCA origins (series 11, image 23). Fetal type left PCA origin with mildly irregular left posterior communicating artery. Diminutive right posterior communicating artery. The right PCA origin is mildly irregular and stenotic. Multifocal high-grade stenosis in the right PCA P2 and proximal P3 segments as seen on series 12, image 17. The left PCA is occluded in the P2 segment which is new since 2016. Anterior circulation: Both ICA siphons are patent, the left is somewhat dolichoectatic but with only minimal calcified plaque and no stenosis. There is intermittent calcified plaque in the right siphon with mild stenosis at the right anterior genu. Ophthalmic and posterior communicating artery origins are normal. Carotid termini are patent. The right ACA A1 segment is diminutive or absent. Normal left ACA origin. Anterior communicating artery is normal. There is widespread mild bilateral ACA A2 and  distal irregularity. Left MCA origin, M1 segment, bifurcation, and left MCA branches are within normal limits. Right MCA origin, M1 segment, bifurcation, and right MCA branches are within normal limits. Venous sinuses: Patent. Anatomic variants: Fetal type left PCA origin. Dominant left ACA A1 and diminutive or  absent right ACA A1. Dominant appearing left vertebral artery and left ICA siphon. Delayed phase: Chronic left PCA infarct. Patchy white matter and deep gray matter hypodensity appears stable. Chronic right cerebellar infarct. Stable gray-white matter differentiation throughout the brain. No abnormal enhancement identified. IMPRESSION: 1. No emergent large vessel occlusion. Occlusion of the left PCA is new since the 2016 MRA but probably occurred in association with the left PCA infarct at that time. 2. Moderate to severe atherosclerosis affecting the posterior circulation. Moderate or severe stenoses of the: - Right vertebral artery V1 and V4 segments, - Left vertebral artery V4 segment, - mid Basilar artery, - Right PCA P2 and P3 segments. 3. Comparatively mild anterior circulation atherosclerosis, with no carotid bifurcation stenosis despite atherosclerotic plaque. There is mild atherosclerotic stenosis of the right ICA siphon due to calcified plaque, and widespread mild ACA branch irregularity which appears likely due to atherosclerosis. 4. Stable CT appearance of the brain since 09/27/2015. Electronically Signed   By: Odessa Fleming M.D.   On: 09/28/2015 11:05   Mr Maxine Glenn Head Wo Contrast  09/28/2015  CLINICAL DATA:  Follow-up stroke. Worsening weakness LEFT extremity. History of hypertension. EXAM: MRA HEAD WITHOUT CONTRAST TECHNIQUE: Angiographic images of the Circle of Willis were obtained using MRA technique without intravenous contrast. COMPARISON:  MRI head October 05, 2015 and MRA head July 15, 2014 FINDINGS: Moderate motion degraded examination. ANTERIOR CIRCULATION: Normal flow related enhancement of the included cervical, petrous, cavernous and supraclinoid internal carotid arteries. Bilateral anterior cerebral artery wires from LEFT A1-2 junction. Patent bilateral anterior and middle cerebral arteries though, poor characterization due to motion. POSTERIOR CIRCULATION: LEFT vertebral artery is dominant. Thready  flow related enhancement RIGHT vertebral artery in part may be related to motion. Suspected at least moderate stenosis proximal basilar artery. Bilateral posterior communicating arteries are present with patent bilateral posterior cerebral arteries. Poor characterization of posterior cerebral arteries. IMPRESSION: Moderately motion degraded examination. No acute large vessel occlusion. Poorly characterized cerebral arteries. Electronically Signed   By: Awilda Metro M.D.   On: 09/28/2015 06:21   Mr Brain Wo Contrast  09/27/2015  CLINICAL DATA:  Posterior headache and worsening right leg weakness beginning 2 days ago. 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 EXAM: MRI HEAD WITHOUT CONTRAST TECHNIQUE: Multiplanar, multiecho pulse sequences of the brain and surrounding structures were obtained without intravenous contrast. COMPARISON:  Head CT 09/27/2015 and MRI 07/15/2014 FINDINGS: There is a 4 mm focus of mildly restricted diffusion involving right temporoparietal white matter adjacent to the lateral ventricle consistent with acute infarct. There is also a 9 mm region of abnormal trace diffusion signal in the posterior left corona radiata with normal to subtly diminished ADC, also likely reflecting an acute or early subacute infarct given patient's worsening right leg weakness. No associated hemorrhage. No mass, midline shift, or extra-axial fluid collection. Global cerebral atrophy is unchanged from the prior MRI and mildly advanced for age. There is a chronic left occipital PCA territory infarct which was acute on the prior MRI. Patchy T2 hyperintensities elsewhere in the cerebral white matter bilaterally are unchanged and compatible with moderately advanced chronic small vessel ischemic disease for age. Chronic deep cerebral  white matter/ basal ganglia and thalamic lacunar infarcts are noted bilaterally. A large area of encephalomalacia is again seen  involving the right cerebellum related to prior hemorrhage with ex vacuo dilatation of the fourth ventricle. Sequelae of suboccipital craniectomy are again identified. There are also small chronic infarcts in the right pons. Orbits are unremarkable. 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 Paranasal sinuses and mastoid air cells are clear. Major intracranial vascular flow voids are preserved. IMPRESSION: 1. Small, acute to early subacute infarcts in the left corona radiata and right temporoparietal white matter. 2. Moderate chronic small vessel ischemic disease. 3. Chronic left PCA infarct. 4. Chronic cerebellar encephalomalacia. Electronically Signed   By: Sebastian Ache M.D.   On: 09/27/2015 19:24        Discharge Exam: Filed Vitals:   09/28/15 1530 09/28/15 1811  BP: 132/92 159/95  Pulse: 67 68  Temp: 98.1 F (36.7 C) 98.8 F (37.1 C)  Resp: 16 16   Filed Vitals:   09/28/15 1130 09/28/15 1450 09/28/15 1530 09/28/15 1811  BP: 146/100 127/85 132/92 159/95  Pulse: 67 67 67 68  Temp: 98.2 F (36.8 C) 98.4 F (36.9 C) 98.1 F (36.7 C) 98.8 F (37.1 C)  TempSrc: Oral Oral Oral Oral  Resp: 18 19 16 16   Height:      Weight:      SpO2: 98% 98% 97% 94%    General: Pt is alert, awake, not in acute distress Cardiovascular: RRR, S1/S2 +, no rubs, no gallops Respiratory: CTA bilaterally, no wheezing, no rhonchi Abdominal: Soft, NT, ND, bowel sounds + Extremities: no edema, no cyanosis   The results of significant diagnostics from this hospitalization (including imaging, microbiology, ancillary and laboratory) are listed below for reference.    Significant Diagnostic Studies: Ct Angio Head W Or Wo Contrast  09/28/2015  CLINICAL DATA:  50 year old male with small bilateral white matter infarcts detected on MRI for headache and right lower extremity weakness. Personal  history of cerebellar and left PCA infarcts. Initial encounter. EXAM: CT ANGIOGRAPHY HEAD AND NECK TECHNIQUE: Multidetector CT imaging of the head and neck was performed using the standard protocol during bolus administration of intravenous contrast. Multiplanar CT image reconstructions and MIPs were obtained to evaluate the vascular anatomy. Carotid stenosis measurements (when applicable) are obtained utilizing NASCET criteria, using the distal internal carotid diameter as the denominator. CONTRAST:  50 mL Isovue 370 COMPARISON:  Intracranial MRA 09/28/2015, and head and neck MRA 07/15/2014. Head CT without contrast 09/27/2015. FINDINGS: CTA NECK Skeleton: Absent maxillary dentition. 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 Previous suboccipital craniectomy. No acute osseous abnormality identified. Stable and well pneumatized visualized paranasal sinuses and mastoids. Other neck: Mediastinal lipomatosis. No upper thoracic lymphadenopathy. Negative lung apices. Negative thyroid. Mild deformity of the subglottic trachea probably is related to previous tracheostomy. No airway narrowing. Negative larynx, pharynx, parapharyngeal spaces, retropharyngeal space, sublingual space, submandibular glands and parotid glands. Negative orbit soft tissues. No acute scalp soft tissue findings. No cervical lymphadenopathy. Aortic arch: 3 vessel arch configuration. Mild soft atherosclerotic plaque of the arch with no great vessel origin stenosis. Right carotid system: Mild soft plaque in the right CCA without stenosis. Soft and calcified plaque at the right carotid bifurcation, but no subsequent stenosis. Left carotid system: Mild soft plaque of the left CCA. Mild tortuosity of the proximal left CCA.  Unusual anterior distal left CCA thyroidal branch. Soft plaque at the left carotid bifurcation, but no associated left ICA stenosis. Vertebral arteries: No proximal subclavian artery stenosis. Normal  vertebral artery origins. However, there is high-grade stenosis of the right vertebral artery V1 segment related to soft plaque as seen on series 8, image 121. No proximal left vertebral artery stenosis. The right vertebral remains patent, but the left vertebral is dominant throughout the neck. No other vertebral artery stenosis in the neck. CTA HEAD Posterior circulation: Calcified plaque in both distal vertebral arteries. On the left this is distal to the normal left PICA origin. There is subsequent moderate stenosis of the left V4 segment. On the right there is multifocal calcified plaque with moderate to severe right V4 segment stenosis (series 7, image 157 and series 8, image 97). Despite this the vertebrobasilar junction is patent. There is basilar artery irregularity with moderate proximal basilar stenosis best seen on series 11, image 24. This appears stable since 2016. The basilar remains patent. There is distal basilar artery irregularity with mild stenosis just proximal to the SCA origins (series 11, image 23). Fetal type left PCA origin with mildly irregular left posterior communicating artery. Diminutive right posterior communicating artery. The right PCA origin is mildly irregular and stenotic. Multifocal high-grade stenosis in the right PCA P2 and proximal P3 segments as seen on series 12, image 17. The left PCA is occluded in the P2 segment which is new since 2016. Anterior circulation: Both ICA siphons are patent, the left is somewhat dolichoectatic but with only minimal calcified plaque and no stenosis. There is intermittent calcified plaque in the right siphon with mild stenosis at the right anterior genu. Ophthalmic and posterior communicating artery origins are normal. Carotid termini are patent. The right ACA A1 segment is diminutive or absent. Normal left ACA origin. Anterior communicating artery is normal. There is widespread mild bilateral ACA A2 and distal irregularity. Left MCA origin, M1  segment, bifurcation, and left MCA branches are within normal limits. Right MCA origin, M1 segment, bifurcation, and right MCA branches are within normal limits. Venous sinuses: Patent. Anatomic variants: Fetal type left PCA origin. Dominant left ACA A1 and diminutive or absent right ACA A1. Dominant appearing left vertebral artery and left ICA siphon. Delayed phase: Chronic left PCA infarct. Patchy white matter and deep gray matter hypodensity appears stable. Chronic right cerebellar infarct. Stable gray-white matter differentiation throughout the brain. No abnormal enhancement identified. IMPRESSION: 1. No emergent large vessel occlusion. Occlusion of the left PCA is new since the 2016 MRA but probably occurred in association with the left PCA infarct at that time. 2. Moderate to severe atherosclerosis affecting the posterior circulation. Moderate or severe stenoses of the: - Right vertebral artery V1 and V4 segments, - Left vertebral artery V4 segment, - mid Basilar artery, - Right PCA P2 and P3 segments. 3. Comparatively mild anterior circulation atherosclerosis, with no carotid bifurcation stenosis despite atherosclerotic plaque. There is mild atherosclerotic stenosis of the right ICA siphon due to calcified plaque, and widespread mild ACA branch irregularity which appears likely due to atherosclerosis. 4. Stable CT appearance of the brain since 09/27/2015. Electronically Signed   By: Odessa Fleming M.D.   On: 09/28/2015 11:05   Dg Chest 2 View  09/27/2015  CLINICAL DATA:  CEREBRAL VASCULAR ACCIDENT. EXAM: CHEST  2 VIEW COMPARISON:  06/19/2004 FINDINGS: Normal mediastinum and cardiac silhouette. Rounded density over the LEFT cardiophrenic angle measuring 4 cm in diameter. Normal pulmonary vasculature. No evidence  of effusion, infiltrate, or pneumothorax. No acute bony abnormality. IMPRESSION: No active cardiopulmonary disease. Rounded density over the LEFT cardiophrenic angle with differential including hiatal  hernia or aortic aneurysm. Electronically Signed   By: Genevive Bi M.D.   On: 09/27/2015 21:35   Ct Head Wo Contrast  09/27/2015  CLINICAL DATA:  Leg weakness and pain along the back of the head starting 2 days ago. Remote stroke. EXAM: CT HEAD WITHOUT CONTRAST TECHNIQUE: Contiguous axial images were obtained from the base of the skull through the vertex without intravenous contrast. COMPARISON:  07/15/2014 CT and MRI exams FINDINGS: Encephalomalacia in the right cerebellum with prior suboccipital craniectomy. Remote left posterior cerebral artery distribution infarct. Remote lacunar infarcts in the right external capsule and right periventricular white matter. Periventricular white matter and corona radiata hypodensities favor chronic ischemic microvascular white matter disease. No intracranial hemorrhage, mass lesion, or acute CVA. IMPRESSION: 1. No acute intracranial findings. 2. Right cerebellar encephalomalacia with prior left PCA distribution infarct and remote lacunar infarcts in the right external capsule and periventricular white matter. 3. Periventricular white matter and corona radiata hypodensities favor chronic ischemic microvascular white matter disease. 4. Prior suboccipital craniectomy. Electronically Signed   By: Gaylyn Rong M.D.   On: 09/27/2015 15:40   Ct Angio Neck W Or Wo Contrast  09/28/2015  CLINICAL DATA:  50 year old male with small bilateral white matter infarcts detected on MRI for headache and right lower extremity weakness. Personal history of cerebellar and left PCA infarcts. Initial encounter. EXAM: CT ANGIOGRAPHY HEAD AND NECK TECHNIQUE: Multidetector CT imaging of the head and neck was performed using the standard protocol during bolus administration of intravenous contrast. Multiplanar CT image reconstructions and MIPs were obtained to evaluate the vascular anatomy. Carotid stenosis measurements (when applicable) are obtained utilizing NASCET criteria, using the  distal internal carotid diameter as the denominator. CONTRAST:  50 mL Isovue 370 COMPARISON:  Intracranial MRA 09/28/2015, and head and neck MRA 07/15/2014. Head CT without contrast 09/27/2015. FINDINGS: CTA NECK Skeleton: Absent maxillary dentition. 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 Previous suboccipital craniectomy. No acute osseous abnormality identified. Stable and well pneumatized visualized paranasal sinuses and mastoids. Other neck: Mediastinal lipomatosis. No upper thoracic lymphadenopathy. Negative lung apices. Negative thyroid. Mild deformity of the subglottic trachea probably is related to previous tracheostomy. No airway narrowing. Negative larynx, pharynx, parapharyngeal spaces, retropharyngeal space, sublingual space, submandibular glands and parotid glands. Negative orbit soft tissues. No acute scalp soft tissue findings. No cervical lymphadenopathy. Aortic arch: 3 vessel arch configuration. Mild soft atherosclerotic plaque of the arch with no great vessel origin stenosis. Right carotid system: Mild soft plaque in the right CCA without stenosis. Soft and calcified plaque at the right carotid bifurcation, but no subsequent stenosis. Left carotid system: Mild soft plaque of the left CCA. Mild tortuosity of the proximal left CCA. Unusual anterior distal left CCA thyroidal branch. Soft plaque at the left carotid bifurcation, but no associated left ICA stenosis. Vertebral arteries: No proximal subclavian artery stenosis. Normal vertebral artery origins. However, there is high-grade stenosis of the right vertebral artery V1 segment related to soft plaque as seen on series 8, image 121. No proximal left vertebral artery stenosis. The right vertebral remains patent, but the left vertebral is dominant throughout the neck. No other vertebral artery stenosis in the neck. CTA HEAD Posterior circulation: Calcified plaque in both distal vertebral arteries. On the left this is  distal to the normal left PICA origin. There is subsequent  moderate stenosis of the left V4 segment. On the right there is multifocal calcified plaque with moderate to severe right V4 segment stenosis (series 7, image 157 and series 8, image 97). Despite this the vertebrobasilar junction is patent. There is basilar artery irregularity with moderate proximal basilar stenosis best seen on series 11, image 24. This appears stable since 2016. The basilar remains patent. There is distal basilar artery irregularity with mild stenosis just proximal to the SCA origins (series 11, image 23). Fetal type left PCA origin with mildly irregular left posterior communicating artery. Diminutive right posterior communicating artery. The right PCA origin is mildly irregular and stenotic. Multifocal high-grade stenosis in the right PCA P2 and proximal P3 segments as seen on series 12, image 17. The left PCA is occluded in the P2 segment which is new since 2016. Anterior circulation: Both ICA siphons are patent, the left is somewhat dolichoectatic but with only minimal calcified plaque and no stenosis. There is intermittent calcified plaque in the right siphon with mild stenosis at the right anterior genu. Ophthalmic and posterior communicating artery origins are normal. Carotid termini are patent. The right ACA A1 segment is diminutive or absent. Normal left ACA origin. Anterior communicating artery is normal. There is widespread mild bilateral ACA A2 and distal irregularity. Left MCA origin, M1 segment, bifurcation, and left MCA branches are within normal limits. Right MCA origin, M1 segment, bifurcation, and right MCA branches are within normal limits. Venous sinuses: Patent. Anatomic variants: Fetal type left PCA origin. Dominant left ACA A1 and diminutive or absent right ACA A1. Dominant appearing left vertebral artery and left ICA siphon. Delayed phase: Chronic left PCA infarct. Patchy white matter and deep gray matter hypodensity  appears stable. Chronic right cerebellar infarct. Stable gray-white matter differentiation throughout the brain. No abnormal enhancement identified. IMPRESSION: 1. No emergent large vessel occlusion. Occlusion of the left PCA is new since the 2016 MRA but probably occurred in association with the left PCA infarct at that time. 2. Moderate to severe atherosclerosis affecting the posterior circulation. Moderate or severe stenoses of the: - Right vertebral artery V1 and V4 segments, - Left vertebral artery V4 segment, - mid Basilar artery, - Right PCA P2 and P3 segments. 3. Comparatively mild anterior circulation atherosclerosis, with no carotid bifurcation stenosis despite atherosclerotic plaque. There is mild atherosclerotic stenosis of the right ICA siphon due to calcified plaque, and widespread mild ACA branch irregularity which appears likely due to atherosclerosis. 4. Stable CT appearance of the brain since 09/27/2015. Electronically Signed   By: Odessa Fleming M.D.   On: 09/28/2015 11:05   Mr Maxine Glenn Head Wo Contrast  09/28/2015  CLINICAL DATA:  Follow-up stroke. Worsening weakness LEFT extremity. History of hypertension. EXAM: MRA HEAD WITHOUT CONTRAST TECHNIQUE: Angiographic images of the Circle of Willis were obtained using MRA technique without intravenous contrast. COMPARISON:  MRI head October 05, 2015 and MRA head July 15, 2014 FINDINGS: Moderate motion degraded examination. ANTERIOR CIRCULATION: Normal flow related enhancement of the included cervical, petrous, cavernous and supraclinoid internal carotid arteries. Bilateral anterior cerebral artery wires from LEFT A1-2 junction. Patent bilateral anterior and middle cerebral arteries though, poor characterization due to motion. POSTERIOR CIRCULATION: LEFT vertebral artery is dominant. Thready flow related enhancement RIGHT vertebral artery in part may be related to motion. Suspected at least moderate stenosis proximal basilar artery. Bilateral posterior  communicating arteries are present with patent bilateral posterior cerebral arteries. Poor characterization of posterior cerebral arteries. IMPRESSION: Moderately motion degraded examination. No acute  large vessel occlusion. Poorly characterized cerebral arteries. Electronically Signed   By: Awilda Metro M.D.   On: 09/28/2015 06:21   Mr Brain Wo Contrast  09/27/2015  CLINICAL DATA:  Posterior headache and worsening right leg weakness beginning 2 days ago. 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 EXAM: MRI HEAD WITHOUT CONTRAST TECHNIQUE: Multiplanar, multiecho pulse sequences of the brain and surrounding structures were obtained without intravenous contrast. COMPARISON:  Head CT 09/27/2015 and MRI 07/15/2014 FINDINGS: There is a 4 mm focus of mildly restricted diffusion involving right temporoparietal white matter adjacent to the lateral ventricle consistent with acute infarct. There is also a 9 mm region of abnormal trace diffusion signal in the posterior left corona radiata with normal to subtly diminished ADC, also likely reflecting an acute or early subacute infarct given patient's worsening right leg weakness. No associated hemorrhage. No mass, midline shift, or extra-axial fluid collection. Global cerebral atrophy is unchanged from the prior MRI and mildly advanced for age. There is a chronic left occipital PCA territory infarct which was acute on the prior MRI. Patchy T2 hyperintensities elsewhere in the cerebral white matter bilaterally are unchanged and compatible with moderately advanced chronic small vessel ischemic disease for age. Chronic deep cerebral white matter/ basal ganglia and thalamic lacunar infarcts are noted bilaterally. A large area of encephalomalacia is again seen involving the right cerebellum related to prior hemorrhage with ex vacuo dilatation of the fourth ventricle. Sequelae of suboccipital craniectomy are again  identified. There are also small chronic infarcts in the right pons. Orbits are unremarkable. 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 Paranasal sinuses and mastoid air cells are clear. Major intracranial vascular flow voids are preserved. IMPRESSION: 1. Small, acute to early subacute infarcts in the left corona radiata and right temporoparietal white matter. 2. Moderate chronic small vessel ischemic disease. 3. Chronic left PCA infarct. 4. Chronic cerebellar encephalomalacia. Electronically Signed   By: Sebastian Ache M.D.   On: 09/27/2015 19:24     Microbiology: No results found for this or any previous visit (from the past 240 hour(s)).   Labs: Basic Metabolic Panel:  Recent Labs Lab 09/27/15 1558 09/27/15 1609 09/28/15 0716  NA 140 143 139  K 3.8 3.9 4.0  CL 111 107 112*  CO2 22  --  20*  GLUCOSE 90 88 96  BUN 17 16 14   CREATININE 1.11 1.20 1.15  CALCIUM 8.8*  --  8.5*   Liver Function Tests:  Recent Labs Lab 09/27/15 1558  AST 19  ALT 21  ALKPHOS 58  BILITOT 0.7  PROT 7.1  ALBUMIN 3.7   No results for input(s): LIPASE, AMYLASE in the last 168 hours. No results for input(s): AMMONIA in the last 168 hours. CBC:  Recent Labs Lab 09/27/15 1558 09/27/15 1609 09/28/15 0716  WBC 10.5  --  5.3  NEUTROABS 8.0*  --   --   HGB 14.3 15.0 13.6  HCT 43.3 44.0 42.5  MCV 77.5*  --  77.7*  PLT 268  --  224   Cardiac Enzymes: No results for input(s): CKTOTAL, CKMB, CKMBINDEX, TROPONINI in the last 168 hours. BNP: Invalid input(s): POCBNP CBG: No results for input(s): GLUCAP in the last 168 hours.  Time coordinating discharge:  Greater than 30 minutes  Signed:  Addilynne Olheiser, DO [facility] Pager: [phone]  09/28/2015, 6:36 PM

## 2015-09-28 NOTE — Progress Notes (Signed)
STROKE TEAM PROGRESS NOTE   HISTORY OF PRESENT ILLNESS (per record) George Villa is an 50 y.o. male withPricilla Larsson a history of hypertension, previous strokes, including cerebellar hemorrhage with hydrocephalus, requiring surgical evacuation, coronary artery disease and chronic low back pain, presenting with worsening of weakness involving his left lower extremity starting on 09/24/2015, time unknown (LKW). He has residual weakness from previous stroke and routinely ambulates with a cane. Patient has been taking aspirin daily. CT scan of his head was unremarkable. MRI of his brain showed acute to early subacute right parietal temporal and left corona radiata small ischemic infarctions. NIH stroke score was 3. Patient was not administered IV t-PA secondary to Beyond time under for treatment consideration. He was admitted for further evaluation and treatment.   SUBJECTIVE (INTERVAL HISTORY) He is lying in the bed, awake. Patient reports he is driving even though his peripheral vision is poor/limited. Per Dr. Marlis EdelsonSethi's last note, pt had a partial hemianopia. Today's exam by Dr. Roda ShuttersXu seems more completed. Await OT eval of vision.    OBJECTIVE Temp:  [97.7 F (36.5 C)-99 F (37.2 C)] 98.1 F (36.7 C) (06/21 0930) Pulse Rate:  [68-117] 80 (06/21 0930) Cardiac Rhythm:  [-] Normal sinus rhythm (06/21 1033) Resp:  [13-19] 18 (06/21 0930) BP: (123-158)/(78-114) 156/95 mmHg (06/21 0930) SpO2:  [95 %-99 %] 97 % (06/21 0930) Weight:  [90.719 kg (200 lb)] 90.719 kg (200 lb) (06/20 2337)  CBC:   Recent Labs Lab 09/27/15 1558 09/27/15 1609 09/28/15 0716  WBC 10.5  --  5.3  NEUTROABS 8.0*  --   --   HGB 14.3 15.0 13.6  HCT 43.3 44.0 42.5  MCV 77.5*  --  77.7*  PLT 268  --  224    Basic Metabolic Panel:   Recent Labs Lab 09/27/15 1558 09/27/15 1609 09/28/15 0716  NA 140 143 139  K 3.8 3.9 4.0  CL 111 107 112*  CO2 22  --  20*  GLUCOSE 90 88 96  BUN 17 16 14   CREATININE 1.11 1.20 1.15  CALCIUM  8.8*  --  8.5*    Lipid Panel:     Component Value Date/Time   CHOL 161 09/28/2015 0718   TRIG 102 09/28/2015 0718   HDL 31* 09/28/2015 0718   CHOLHDL 5.2 09/28/2015 0718   VLDL 20 09/28/2015 0718   LDLCALC 110* 09/28/2015 0718   HgbA1c:  Lab Results  Component Value Date   HGBA1C 5.9* 07/15/2014   Urine Drug Screen:     Component Value Date/Time   LABOPIA NONE DETECTED 09/27/2015 1551   COCAINSCRNUR NONE DETECTED 09/27/2015 1551   LABBENZ NONE DETECTED 09/27/2015 1551   AMPHETMU NONE DETECTED 09/27/2015 1551   THCU NONE DETECTED 09/27/2015 1551   LABBARB NONE DETECTED 09/27/2015 1551      IMAGING  Dg Chest 2 View 09/27/2015  No active cardiopulmonary disease. Rounded density over the LEFT cardiophrenic angle with differential including hiatal hernia or aortic aneurysm.   Ct Head Wo Contrast 09/27/2015  1. No acute intracranial findings. 2. Right cerebellar encephalomalacia with prior left PCA distribution infarct and remote lacunar infarcts in the right external capsule and periventricular white matter. 3. Periventricular white matter and corona radiata hypodensities favor chronic ischemic microvascular white matter disease. 4. Prior suboccipital craniectomy.   Ct Angio Head & Neck W Or Wo Contrast 09/28/2015  1. No emergent large vessel occlusion. Occlusion of the left PCA is new since the 2016 MRA but probably occurred in association  with the left PCA infarct at that time. 2. Moderate to severe atherosclerosis affecting the posterior circulation. Moderate or severe stenoses of the: - Right vertebral artery V1 and V4 segments, - Left vertebral artery V4 segment, - mid Basilar artery, - Right PCA P2 and P3 segments. 3. Comparatively mild anterior circulation atherosclerosis, with no carotid bifurcation stenosis despite atherosclerotic plaque. There is mild atherosclerotic stenosis of the right ICA siphon due to calcified plaque, and widespread mild ACA branch irregularity which  appears likely due to atherosclerosis. 4. Stable CT appearance of the brain since 09/27/2015.   Mr George Villa Head Wo Contrast 09/28/2015  Moderately motion degraded examination. No acute large vessel occlusion. Poorly characterized cerebral arteries.   Mr Brain Wo Contrast 09/27/2015  1. Small, acute to early subacute infarcts in the left corona radiata and right temporoparietal white matter. 2. Moderate chronic small vessel ischemic disease. 3. Chronic left PCA infarct. 4. Chronic cerebellar encephalomalacia.   2D echo - Left ventricle: The cavity size was normal. Wall thickness was increased in a pattern of severe LVH. Systolic function was normal. The estimated ejection fraction was in the range of 55% to 60%. Wall motion was normal; there were no regional wall motion abnormalities. Left ventricular diastolic function parameters were normal. - Atrial septum: No defect or patent foramen ovale was identified. Impressions:  - No cardiac source of emboli was indentified.  LE venous doppler - pending   PHYSICAL EXAM Physical exam  Temp:  [97.7 F (36.5 C)-99 F (37.2 C)] 98.8 F (37.1 C) (06/21 1811) Pulse Rate:  [67-86] 68 (06/21 1811) Resp:  [16-19] 16 (06/21 1811) BP: (123-159)/(78-102) 159/95 mmHg (06/21 1811) SpO2:  [94 %-99 %] 94 % (06/21 1811) Weight:  [200 lb (90.719 kg)] 200 lb (90.719 kg) (06/20 2337)  General - Well nourished, well developed, in no apparent distress.  Ophthalmologic - Fundi not visualized.  Cardiovascular - Regular rate and rhythm.  Mental Status -  Level of arousal and orientation to time, place, and person were intact. Language including expression, naming, repetition, comprehension was assessed and found intact. Fund of Knowledge was assessed and was intact.  Cranial Nerves II - XII - II - right homonymous hemianopia. III, IV, VI - Extraocular movements intact. V - Facial sensation intact bilaterally. VII - Facial movement intact bilaterally. VIII -  Hearing & vestibular intact bilaterally. X - Palate elevates symmetrically. XI - Chin turning & shoulder shrug intact bilaterally. XII - Tongue protrusion intact.  Motor Strength - The patient's strength was normal in all extremities except LLE 4+/5 proximally and pronator drift was absent.  Bulk was normal and fasciculations were absent.   Motor Tone - Muscle tone was assessed at the neck and appendages and was normal.  Reflexes - The patient's reflexes were 1+ in all extremities and he had no pathological reflexes.  Sensory - Light touch, temperature/pinprick were assessed and were symmetrical.    Coordination - The patient had normal movements in the hands and feet with no ataxia or dysmetria.  Tremor was absent.  Gait and Station - deferred to PT.   ASSESSMENT/PLAN [name] is a 50 y.o. male with history of HTN, CVAs with residual leg weakness and right sided peripheral vision loss, intracranial hemorrhage s/p evacuation, CAD s/p PCI, and chronic back pain presenting with worsening left leg weakness and headache x 3 days. He did not receive IV t-PA due to delay in arrival.   Stroke:  left corona radiata and right temporoparietal  white matter infarcts likely secondary to small vessel disease source. He does have stroke risk factors including HTN, HLD, CAD s/p stent. He also has hx of cerebellar ICH requiring evacuation and EVD placement. He may not be a good candidate for anticoagulation. Hold off embolic work up so far. He will follow up with Dr. Pearlean Brownie as outpt.  Resultant no new deficit, chronic right homonomous hemianopia - however, pt still driving. Will wait for OT evaluation.  CTA H&N no emergeny large vessel occlusion. L PCA occlusion since 2016. Mod to severe intracranial atherosclerosis.   MRI  left corona radiata and right temporoparietal white matter infarcts   MRA  No large vessel stenosis  2D Echo  EF 55-60%. Severe LVH. No source of embolus   LDL  110  HgbA1c 5.9 in April  Lovenox 40 mg sq daily for VTE prophylaxis Diet Heart Room service appropriate?: Yes; Fluid consistency:: Thin  aspirin 81 mg daily prior to admission, now on aspirin 81 mg daily and clopidogrel 75 mg daily  Patient counseled to be compliant with his antithrombotic medications  Ongoing aggressive stroke risk factor management  Therapy recommendations:  No PT, await OT eval of vision prior to deciding on pt's ability to drive.   Disposition:  Return home  Hypertension  Stable  Permissive hypertension (OK if < 220/120) but gradually normalize in 5-7 days  Long-term BP goal normotensive  Hyperlipidemia  Home meds:  No statin  Add lipitor 40 mg daily  LDL 110, goal < 70  Continue statin at discharge  Other Stroke Risk Factors  ETOH use, advised to drink no more than 2 drinks a day  Obesity, Body mass index is 31.32 kg/(m^2)., recommend weight loss, diet and exercise as appropriate   Hx stroke/TIA  07/2014 L occipital d/t atherosclerosis, tx with asa and plavix   05/2004 cerebellar hemorrhage s/p evacuation and ventriculostomy  Family hx stroke  Coronary artery disease - MI, s/p PCI w/ stent  Other Active Problems  Chronic back pain  Low MCV/MCH  Hospital day # 1  Rhoderick Moody [facility] See Amion for Pager information 09/28/2015 3:52 PM   I, the attending vascular neurologist, have personally obtained a history, examined the patient, evaluated laboratory data, individually viewed imaging studies and agree with radiology interpretations. Together with the NP/PA, we formulated the assessment and plan of care which reflects our mutual decision.  I have made any additions or clarifications directly to the above note and agree with the findings and plan as currently documented.   Pt admitted again for stroke. He had cerebellar ICH s/p evacuation and EVD in 2006, left PCA stroke in 07/2014 and CAD s/p stent in 08/2013. This time  admitted for right periventricular WM punctate infarct and left CR infarct. Stroke pattern still consistent with small vessel disease, and pt does have stroke risk factors. Also his hx of cerebellar ICH s/p evacuation and EVD made him not a ideal candidate for anticoagulation, so embolic work up held off. He will continue follow up with Dr. Pearlean Brownie if embolic work up deemed necessary. Pt still has chronic right hemianopia. However, he has been driving since the stroke. He stated that nobody told him not to drive and his eye doctor seems OK for him to drive but no document. He has been driving carefully and has no accident. He lives alone and he needs to drive. I am concerned about his driving due to hemianopia. I would like OT to have further evaluation.  Continue DAPT due to cardiac and stroke risks. Add statin.   Neurology will sign off. Please call with questions. Pt will follow up with Dr. Pearlean Brownie at [facility] in about 2 months. Thanks for the consult.  Marvel Plan, MD PhD Stroke Neurology 09/28/2015 6:53 PM   ADDENDUM:  Discussed with Dr. Arbutus Leas. Pt will be discharged today. I recommend home OT to follow up with him about driving. He has somebody to pick him up for discharge today. He will be advised not to drive before home OT, or ophthalmology clears him. He will also follow up with DR. Sethi to decide on driving.   Marvel Plan, MD PhD Stroke Neurology 09/28/2015 10:09 PM    To contact Stroke Continuity provider, please refer to [email or web address]. After hours, contact General Neurology

## 2015-09-28 NOTE — Progress Notes (Signed)
Pt d/c'd via wheelchair via nurse. Pt understood discharge instructions

## 2015-09-28 NOTE — Care Management (Signed)
CM delivered cane to patient's room prior to discharge. No further CM need identified.

## 2015-09-29 LAB — HEMOGLOBIN A1C
HEMOGLOBIN A1C: 5.3 % (ref 4.8–5.6)
Mean Plasma Glucose: 105 mg/dL

## 2015-09-29 MED FILL — Perflutren Lipid Microsphere IV Susp 1.1 MG/ML: INTRAVENOUS | Qty: 10 | Status: AC

## 2015-12-05 ENCOUNTER — Ambulatory Visit: Payer: Medicaid Other | Admitting: Neurology

## 2015-12-06 ENCOUNTER — Encounter: Payer: Self-pay | Admitting: Neurology

## 2015-12-07 ENCOUNTER — Ambulatory Visit (INDEPENDENT_AMBULATORY_CARE_PROVIDER_SITE_OTHER): Payer: Medicaid Other | Admitting: Neurology

## 2015-12-07 ENCOUNTER — Encounter: Payer: Self-pay | Admitting: Neurology

## 2015-12-07 VITALS — BP 144/96 | HR 102 | Ht 67.0 in | Wt 198.6 lb

## 2015-12-07 DIAGNOSIS — G8112 Spastic hemiplegia affecting left dominant side: Secondary | ICD-10-CM

## 2015-12-07 NOTE — Progress Notes (Signed)
[facility] [address] [place] [phone]       OFFICE FOLLOW-UP NOTE  [name] Date of Birth:  09-12-1965 Medical Record Number:  [id number]   HPI: He is seen today for follow-up for first office visit following hospital admission for stroke on  07/09/14.Patient is a 50 year old with a history of hypertension, coronary artery disease (status post PCI 2 drug-eluting stents), GERD, chronic low back pain who presents with an occipital headache lasting 3 days. He states that it is a dull pain that radiates to the front of his head. He states that it is around an 8 out of 10 in severity and that his home Percocet helps a little bit for his headache. Patient states that the only time he had a headache similar to this in the past was when he had his hemorrhagic cerebellar stroke in 2006, although he says that the severity of the pain this time is much less. Patient also reports associated right-sided blurry vision. He states that this symptom has resolved although his headache has not resolved. Otherwise, patient denies any language deficits, unilateral weakness, dysarthria, or facial droop. Patient states that he walks with a cane since the previous stroke in 2015 that was remarkable for left leg weakness. CT head initially was unremarkable but MRI scan showed left occipital pole infarct. Patient presented outside the time window for TPA. 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 Transthoracic echo showed normal ejection fraction. Hemoglobin A1c was 5.9. LDL cholesterol was elevated at 145, HDL 38 and total cholesterol 199 mg percent. Vitamin B12 and TSH are normal. Patient was started on aspirin and Plavix and seen by physical outpatient therapy. He states his done well since discharge his cardiologist's asked him to discontinue Plavix  and is taking only aspirin now. States his blood pressure is well controlled though today it is slightly elevated at 158/909 office. He has been driving limitedly and has not had any problems or accidents. He states his vision is improving though he still has. Partial peripheral vision loss. He has had chronic back pain and has undergone back surgery which did not help. He has been getting oxycodone but he does not like it due to side effects and is asking for alternate and medications. Update 11/22/2014 : Patient is worked into the schedule today as he called complaining of, persistent back pain and tramadol not working and increased back pain. I had informed him and stated clearly in my last note that I would try tramadol only has one time and it did not work to seek further guidance from his primary care physician who has been managing his back pain for years. He has been taking Percocet when necessary as well with only short-term relief. His chronic back pain is being managed by his primary physician. He denies any radicular symptoms. He has had no recurrent stroke or TIA symptoms. He remains on aspirin which is tolerating well. He states his blood pressure has been running high mainly because of his pain. Blood pressure today is 161/100 in office. Update 12/07/2015 : He returns for follow-up after last visit a year ago. He was hospitalized on 09/27/15 with worsening of gait and balance difficulties and headache for 3 days prior to presentation.Marland Kitchen. MRI scan showed small left coronary radiata and right temporal white matter lacunar infarct. MRA of the brain showed no  large vessel intracranial stenosis. Transthoracic echo showed normal ejection fraction with severe left ventricular hypertrophy. LDL cholesterol 1110 mg percent and hemoglobin A1c in April 2017 was 5.9. Patient was started on aspirin and Plavix and states his done well since discharge except is having bruising of his skin. He had no bleeding episodes.  His blood pressure is well controlled today it is 144/76. He remains on Lipitor which is tolerating well without muscle aches or pains. He states is active but has to walk slowly because his left leg is tight and ranks. He has recently started a gym and works out for 1 hour every day. ROS:   14 system review of systems is positive for   , gait difficulty, weakness,   joint pain and all other systems negative PMH:  Past Medical History:  Diagnosis Date  . Acid reflux   . 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  . Chronic lower back pain   . Headache    "monthly" (02/18/2014)  . Hypertension   . 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 Stroke [facility]) 2006   "left leg weak since" (02/18/2014)    Social History:  Social History   Social History  . Marital status: Single    Spouse name: N/A  . Number of children: N/A  . Years of education: N/A   Occupational History  . umemployed      Works in [employer]    Social History Main Topics  . Smoking status: Never Smoker  . Smokeless tobacco: Never Used  . Alcohol use 3.6 oz/week    6 Cans of beer per week     Comment: OCCASSIONALLY   . Drug use: No  . Sexual activity: Yes   Other Topics Concern  . Not on file   Social History Narrative   7 cups of caffeine a week     Medications:   Current Outpatient Prescriptions on File Prior to Visit  Medication Sig Dispense Refill  . atorvastatin (LIPITOR) 40 MG tablet Take 1 tablet (40 mg total) by mouth daily at 6 PM. 30 tablet 1  . clopidogrel (PLAVIX) 75 MG tablet Take 1 tablet (75 mg total) by mouth daily. 30 tablet 1  . lisinopril (PRINIVIL,ZESTRIL) 20 MG tablet Take 20 mg by mouth daily.  0  . nitroGLYCERIN (NITROSTAT) 0.4 MG SL tablet Place 1 tablet (0.4 mg total) under the tongue every 5 (five) minutes as needed for chest pain. 25 tablet 2  . omeprazole (PRILOSEC) 20 MG capsule Take 20 mg by mouth.    .  oxyCODONE-acetaminophen (PERCOCET) 10-325 MG per tablet take 1 tablet 4 times daily as needed for pain  0  . RA ASPIRIN EC 81 MG EC tablet Take 81 mg by mouth daily.  0   No current facility-administered medications on file prior to visit.     Allergies:  No Known Allergies  Physical Exam General: well developed, well nourished middle aged Caucasian male, seated, in no evident distress Head: head normocephalic and atraumatic.  Neck: supple with no carotid or supraclavicular bruits Cardiovascular: regular rate and rhythm, no murmurs Musculoskeletal: no deformity Skin:  no rash/petichiae Vascular:  Normal pulses all extremities Vitals:   12/07/15 1333  BP: (!) 144/96  Pulse: (!) 102   Neurologic Exam Mental Status: Awake and fully alert. Oriented to place and time. Recent and remote memory intact. Attention span, concentration and fund of knowledge appropriate. Mood  and affect appropriate.  Cranial Nerves: Fundoscopic exam reveals sharp disc margins. Pupils equal, briskly reactive to light. Extraocular movements full without nystagmus. Visual fields show partial right homonymous hemianopsia to confrontation. Hearing intact. Facial sensation intact. Mild left lower face weakness., Tongue, palate moves normally and symmetrically.  Motor: Normal bulk and tone. Normal strength in all tested extremity muscles except left foot drop and mild weakness of left grip and intrinsic hand muscles. Orbits right over left upper extremity. Fine finger movements are diminished on the left. Slight increased tone on the left side. Sensory.: intact to touch ,pinprick .position and vibratory sensation.  Coordination: Rapid alternating movements are diminished on the left side Finger-to-nose and heel-to-shin performed slowly on the left side Gait and Station: Arises from chair without difficulty. Stance is stooped . Spastic hemiplegic gait with left foot drop and dragging of left leg.    Reflexes: 2+ and  asymmetric and brisker on the left. Toes downgoing.   NIHSS  2 Modified Rankin  2   ASSESSMENT: 50 year male with left occipital lobe infarct secondary to intracranial  Aaherosclerosis in April 2016.  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 Recent admission in June 2017 due to bilateral small lacunar infarcts from small vessel disease from which has made good recovery. He has residual gait difficulties due to left lower leg spasticity    PLAN: I had a long d/w patient about his recent stroke, risk for recurrent stroke/TIAs, personally independently reviewed imaging studies and stroke evaluation results and answered questions.Continue Plavix and discontinue aspirin as I do not see any long-term benefit of dual antiplatelet therapy  for secondary stroke prevention and maintain strict control of hypertension with blood pressure goal below 130/90, diabetes with hemoglobin A1c goal below 6.5% and lipids with LDL cholesterol goal below 70 mg/dL. I also advised the patient to eat a healthy diet with plenty of whole grains, cereals, fruits and vegetables, exercise regularly and maintain ideal body weight is referred to Dr. Terrace Arabia for Botox to help with his left foot spasticity and walking.. Greater than 50% time during this 25 minute visit was spent on counseling and coordination of care about stroke and spasticity Followup in the future with me in future only as necessaryy   Delia Heady, MD   Note: This document was prepared with digital dictation and possible smart phrase technology. Any transcriptional errors that result from this process are unintentional

## 2015-12-07 NOTE — Patient Instructions (Signed)
I had a long d/w patient about his recent stroke, risk for recurrent stroke/TIAs, personally independently reviewed imaging studies and stroke evaluation results and answered questions.Continue Plavix and discontinue aspirin as I do not see any long-term benefit of dual antiplatelet therapy  for secondary stroke prevention and maintain strict control of hypertension with blood pressure goal below 130/90, diabetes with hemoglobin A1c goal below 6.5% and lipids with LDL cholesterol goal below 70 mg/dL. I also advised the patient to eat a healthy diet with plenty of whole grains, cereals, fruits and vegetables, exercise regularly and maintain ideal body weight is referred to Dr. Terrace ArabiaYan for Botox to help with his left foot spasticity and walking.. Followup in the future with me in future only as necessary  Stroke Prevention Some medical conditions and behaviors are associated with an increased chance of having a stroke. You may prevent a stroke by making healthy choices and managing medical conditions. HOW CAN I REDUCE MY RISK OF HAVING A STROKE?   Stay physically active. Get at least 30 minutes of activity on most or all days.  Do not smoke. It may also be helpful to avoid exposure to secondhand smoke.  Limit alcohol use. Moderate alcohol use is considered to be:  No more than 2 drinks per day for men.  No more than 1 drink per day for nonpregnant women.  Eat healthy foods. This involves:  Eating 5 or more servings of fruits and vegetables a day.  Making dietary changes that address high blood pressure (hypertension), high cholesterol, diabetes, or obesity.  Manage your cholesterol levels.  Making food choices that are high in fiber and low in saturated fat, trans fat, and cholesterol may control cholesterol levels.  Take any prescribed medicines to control cholesterol as directed by your health care provider.  Manage your diabetes.  Controlling your carbohydrate and sugar intake is  recommended to manage diabetes.  Take any prescribed medicines to control diabetes as directed by your health care provider.  Control your hypertension.  Making food choices that are low in salt (sodium), saturated fat, trans fat, and cholesterol is recommended to manage hypertension.  Ask your health care provider if you need treatment to lower your blood pressure. Take any prescribed medicines to control hypertension as directed by your health care provider.  If you are 7418-52 years of age, have your blood pressure checked every 3-5 years. If you are 52 years of age or older, have your blood pressure checked every year.  Maintain a healthy weight.  Reducing calorie intake and making food choices that are low in sodium, saturated fat, trans fat, and cholesterol are recommended to manage weight.  Stop drug abuse.  Avoid taking birth control pills.  Talk to your health care provider about the risks of taking birth control pills if you are over 52 years old, smoke, get migraines, or have ever had a blood clot.  Get evaluated for sleep disorders (sleep apnea).  Talk to your health care provider about getting a sleep evaluation if you snore a lot or have excessive sleepiness.  Take medicines only as directed by your health care provider.  For some people, aspirin or blood thinners (anticoagulants) are helpful in reducing the risk of forming abnormal blood clots that can lead to stroke. If you have the irregular heart rhythm of atrial fibrillation, you should be on a blood thinner unless there is a good reason you cannot take them.  Understand all your medicine instructions.  Make sure that other  conditions (such as anemia or atherosclerosis) are addressed. SEEK IMMEDIATE MEDICAL CARE IF:   You have sudden weakness or numbness of the face, arm, or leg, especially on one side of the body.  Your face or eyelid droops to one side.  You have sudden confusion.  You have trouble speaking  (aphasia) or understanding.  You have sudden trouble seeing in one or both eyes.  You have sudden trouble walking.  You have dizziness.  You have a loss of balance or coordination.  You have a sudden, severe headache with no known cause.  You have new chest pain or an irregular heartbeat. Any of these symptoms may represent a serious problem that is an emergency. Do not wait to see if the symptoms will go away. Get medical help at once. Call your local emergency services (911 in [place] Do not drive yourself to the hospital.   This information is not intended to replace advice given to you by your health care provider. Make sure you discuss any questions you have with your health care provider.   Document Released: 05/03/2004 Document Revised: 04/16/2014 Document Reviewed: 09/26/2012 Elsevier Interactive Patient Education [employer].

## 2015-12-26 ENCOUNTER — Encounter: Payer: Self-pay | Admitting: Neurology

## 2015-12-26 ENCOUNTER — Ambulatory Visit (INDEPENDENT_AMBULATORY_CARE_PROVIDER_SITE_OTHER): Payer: Medicaid Other | Admitting: Neurology

## 2015-12-26 VITALS — BP 181/105 | HR 113 | Ht 70.0 in | Wt 197.5 lb

## 2015-12-26 DIAGNOSIS — I63331 Cerebral infarction due to thrombosis of right posterior cerebral artery: Secondary | ICD-10-CM | POA: Diagnosis not present

## 2015-12-26 DIAGNOSIS — G8114 Spastic hemiplegia affecting left nondominant side: Secondary | ICD-10-CM

## 2015-12-26 DIAGNOSIS — Z8679 Personal history of other diseases of the circulatory system: Secondary | ICD-10-CM | POA: Diagnosis not present

## 2015-12-26 DIAGNOSIS — R269 Unspecified abnormalities of gait and mobility: Secondary | ICD-10-CM | POA: Diagnosis not present

## 2015-12-26 NOTE — Progress Notes (Signed)
PATIENT: George Villa DOB: [birth year]  Chief Complaint  Patient presents with  . Spastic Hemiplegia    He is here to discuss treatment with Botox.     HISTORICAL  George Villa is a 50 years old right-handed male, seen in refer by Dr. Pearlean BrownieSethi for evaluation of EMG guided botulism toxin injection for, his primary care physician is Dr.  Lahoma Crockerittana Phoncharoensri, MD  He had a history of hypertension, coronary artery disease (status post PCI 2 drug-eluting stents), GERD, chronic low back pain,   He had multiple strokes in the past, first stroke was in 2006, he presented with left leg weakness, second stroke stroke in 2015, no significant deficit notice, he ambulate with a cane since initial stroke in 2006, he had third stroke in April 2016, presented with new onset right visual distortion, now mostly recovered. Stroke again in June 2016, with mildly increased gait abnormality  He can drive without much difficulty, he lives by himself, he is independent of daily activity.  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  Transthoracic echo showed normal ejection fraction.  Repeat MRI scan in June 2016 showed small left coronary radiata and right temporal white matter lacunar infarct. MRA of the brain showed no large vessel intracranial stenosis. Transthoracic echo showed normal ejection fraction with severe left ventricular hypertrophy. LDL cholesterol 1110 mg percent and hemoglobin A1c in April 2017 was 5.9.   Patient was started on aspirin and Plavix for few months, now on Plavix treatment alone  REVIEW OF SYSTEMS: Full 14 system review of systems performed and notable only for a day: gait abnormality   ALLERGIES:  No Known  Allergies  HOME MEDICATIONS: Current Outpatient Prescriptions  Medication Sig Dispense Refill  . atorvastatin (LIPITOR) 40 MG tablet Take 1 tablet (40 mg total) by mouth daily at 6 PM. 30 tablet 1  . clopidogrel (PLAVIX) 75 MG tablet Take 1 tablet (75 mg total) by mouth daily. 30 tablet 1  . lisinopril (PRINIVIL,ZESTRIL) 20 MG tablet Take 20 mg by mouth daily.  0  . nitroGLYCERIN (NITROSTAT) 0.4 MG SL tablet Place 1 tablet (0.4 mg total) under the tongue every 5 (five) minutes as needed for chest pain. 25 tablet 2  . omeprazole (PRILOSEC) 20 MG capsule Take 20 mg by mouth.    . oxyCODONE-acetaminophen (PERCOCET) 10-325 MG per tablet take 1 tablet 4 times daily as needed for pain  0  . pantoprazole (PROTONIX) 40 MG tablet Take 40 mg by mouth.    Marland Kitchen. RA ASPIRIN EC 81 MG EC tablet Take 81 mg by mouth daily.  0   No current facility-administered medications for this visit.     PAST MEDICAL HISTORY: Past Medical History:  Diagnosis Date  . Acid reflux   . 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  . Chronic lower back pain   . Headache    "monthly" (02/18/2014)  . Hypertension   . 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 Stroke [facility](HCC) 2006   "left leg weak since" (02/18/2014)    PAST SURGICAL HISTORY: Past Surgical History:  Procedure Laterality Date  . BACK SURGERY    . BRAIN HEMATOMA EVACUATION  2006  . LEFT HEART CATHETERIZATION WITH CORONARY ANGIOGRAM N/A 02/17/2014   Procedure: LEFT HEART CATHETERIZATION WITH CORONARY ANGIOGRAM;  Surgeon: [name], MD;  Location: [facility] CATH LAB;  Service: Cardiovascular;  Laterality: N/A;  . LUMBAR DISC SURGERY  2013  . PERCUTANEOUS CORONARY STENT INTERVENTION (PCI-S)  02/17/2014   Procedure: PERCUTANEOUS CORONARY STENT INTERVENTION (PCI-S);  Surgeon: [name], MD;  Location: [facility] CATH LAB;  Service: Cardiovascular;;  . TRACHEOSTOMY  05/2004  .  VENTRICULOSTOMY  2006    FAMILY HISTORY: Family History  Problem Relation Age of Onset  . Heart Problems Father   . Hypertension Mother   . Stroke      SOCIAL HISTORY:  Social History   Social History  . Marital status: Single    Spouse name: N/A  . Number of children: N/A  . Years of education: N/A   Occupational History  . umemployed      Works in [employer]    Social History Main Topics  . Smoking status: Never Smoker  . Smokeless tobacco: Never Used  . Alcohol use 3.6 oz/week    6 Cans of beer per week     Comment: OCCASSIONALLY   . Drug use: No  . Sexual activity: Yes   Other Topics Concern  . Not on file   Social History Narrative   7 cups of caffeine a week      PHYSICAL EXAM   Vitals:   12/26/15 1335  BP: (!) 181/105  Pulse: (!) 113  Weight: 197 lb 8 oz (89.6 kg)  Height: 5\' 10"  (1.778 m)    Not recorded      Body mass index is 28.34 kg/m.  PHYSICAL EXAMNIATION:  Gen: NAD, conversant, well nourised, obese, well groomed                     Cardiovascular: Regular rate rhythm, no peripheral edema, warm, nontender. Eyes: Conjunctivae clear without exudates or hemorrhage Neck: Supple, no carotid bruise. Pulmonary: Clear to auscultation bilaterally   NEUROLOGICAL EXAM:  MENTAL STATUS: Speech:    Speech is normal; fluent and spontaneous with normal comprehension.  Cognition:     Orientation to time, place and person     Normal recent and remote memory     Normal Attention span and concentration     Normal Language, naming, repeating,spontaneous speech     Fund of knowledge   CRANIAL NERVES: CN II: Visual fields are full to confrontation. Fundoscopic exam is normal with sharp discs and no vascular changes. Pupils are round equal and briskly reactive to light. CN III, IV, VI: extraocular movement are normal. Smooth pursuit was broken up into small catch-up set, with dysmetria on eye movement. CN V: Facial sensation is intact to pinprick in  all 3 divisions bilaterally. Corneal responses are intact.  CN VII: Face is symmetric with normal eye closure and smile. CN VIII: Hearing is normal to rubbing fingers CN IX, X: Palate elevates symmetrically. Phonation is normal. CN XI: Head turning and shoulder shrug are intact CN XII: Tongue is midline with normal movements and no atrophy.  MOTOR: He has mild left arm pronation drift, fixation on rapid rotating movement, he has mild left knee flexion weakness, left knee extension ankle dorsiflexion weakness,   REFLEXES: Reflexes are 3 and hyperreflexia of left upper and lower extremity, left Babinski sign was present  SENSORY: Intact to light touch, pinprick, positional sensation and vibratory sensation are intact in fingers  and toes.  COORDINATION: He has significant dysmetric on left finger-to-nose, heel-to-shin,  GAIT/STANCE: He needs push up to get up from seated position, has a tendency to hyperextend his left knee, difficulty with left knee flexion, clear his left foot from the floor  Romberg is absent.   DIAGNOSTIC DATA (LABS, IMAGING, TESTING) - I reviewed patient records, labs, notes, testing and imaging myself where available.   ASSESSMENT AND PLAN  George Villa is a 50 y.o. male   multiple recurrent stroke, with residual spastic left hemiparesis, lower extremity more than upper extremity, dysmetric on left upper and lower extremity  he will likely benefit EMG guided botulism toxin injection for spastic left extremity  Preauthorization for Xeomin 300 units, return to clinic for injection   Levert Feinstein, M.D. Ph.D.  [facility] [address], [address] [place], [place] [address] Ph: [phone] Fax: [phone]  CC: Referring Provider

## 2016-01-19 ENCOUNTER — Emergency Department (HOSPITAL_COMMUNITY): Payer: Medicaid Other

## 2016-01-19 ENCOUNTER — Encounter (HOSPITAL_COMMUNITY): Payer: Self-pay | Admitting: Emergency Medicine

## 2016-01-19 ENCOUNTER — Emergency Department (HOSPITAL_COMMUNITY)
Admission: EM | Admit: 2016-01-19 | Discharge: 2016-01-19 | Disposition: A | Discharge status: Home / Self Care | Payer: Medicaid Other | Attending: Emergency Medicine | Admitting: Emergency Medicine

## 2016-01-19 DIAGNOSIS — Y999 Unspecified external cause status: Secondary | ICD-10-CM | POA: Diagnosis not present

## 2016-01-19 DIAGNOSIS — I2510 Atherosclerotic heart disease of native coronary artery without angina pectoris: Secondary | ICD-10-CM | POA: Diagnosis not present

## 2016-01-19 DIAGNOSIS — Z955 Presence of coronary angioplasty implant and graft: Secondary | ICD-10-CM | POA: Insufficient documentation

## 2016-01-19 DIAGNOSIS — M545 Low back pain, unspecified: Secondary | ICD-10-CM

## 2016-01-19 DIAGNOSIS — W010XXA Fall on same level from slipping, tripping and stumbling without subsequent striking against object, initial encounter: Secondary | ICD-10-CM | POA: Insufficient documentation

## 2016-01-19 DIAGNOSIS — Y92009 Unspecified place in unspecified non-institutional (private) residence as the place of occurrence of the external cause: Secondary | ICD-10-CM | POA: Diagnosis not present

## 2016-01-19 DIAGNOSIS — Z79899 Other long term (current) drug therapy: Secondary | ICD-10-CM | POA: Diagnosis not present

## 2016-01-19 DIAGNOSIS — Z8673 Personal history of transient ischemic attack (TIA), and cerebral infarction without residual deficits: Secondary | ICD-10-CM | POA: Insufficient documentation

## 2016-01-19 DIAGNOSIS — Y9301 Activity, walking, marching and hiking: Secondary | ICD-10-CM | POA: Diagnosis not present

## 2016-01-19 DIAGNOSIS — I10 Essential (primary) hypertension: Secondary | ICD-10-CM | POA: Diagnosis not present

## 2016-01-19 MED ORDER — METHOCARBAMOL 500 MG PO TABS: 500.0000 mg | ORAL_TABLET | Freq: Two times a day (BID) | ORAL | 0 refills | Status: DC | PRN

## 2016-01-19 MED ORDER — METHOCARBAMOL 500 MG PO TABS
500.0000 mg | ORAL_TABLET | Freq: Once | ORAL | Status: AC
  Administered 2016-01-19: 500 mg via ORAL
  Filled 2016-01-19: qty 1

## 2016-01-19 MED ORDER — OXYCODONE-ACETAMINOPHEN 5-325 MG PO TABS
2.0000 | ORAL_TABLET | Freq: Once | ORAL | Status: AC
  Administered 2016-01-19: 2 via ORAL
  Filled 2016-01-19: qty 2

## 2016-01-19 NOTE — Discharge Instructions (Signed)
Continue home pain medication regimen. I have added in a muscle relaxer (Robaxin) to use as needed for muscle tightness and/or pain.    COLD THERAPY DIRECTIONS:  Ice or gel packs can be used to reduce both pain and swelling. Ice is the most helpful within the first 24 to 48 hours after an injury or flareup from overusing a muscle or joint.  Ice is effective, has very few side effects, and is safe for most people to use.   If you expose your skin to cold temperatures for too long or without the proper protection, you can damage your skin or nerves. Watch for signs of skin damage due to cold.   HOME CARE INSTRUCTIONS  Follow these tips to use ice and cold packs safely.  Place a dry or damp towel between the ice and skin. A damp towel will cool the skin more quickly, so you may need to shorten the time that the ice is used.  For a more rapid response, add gentle compression to the ice.  Ice for no more than 10 to 20 minutes at a time. The bonier the area you are icing, the less time it will take to get the benefits of ice.  Check your skin after 5 minutes to make sure there are no signs of a poor response to cold or skin damage.  Rest 20 minutes or more in between uses.  Once your skin is numb, you can end your treatment. You can test numbness by very lightly touching your skin. The touch should be so light that you do not see the skin dimple from the pressure of your fingertip. When using ice, most people will feel these normal sensations in this order: cold, burning, aching, and numbness.   Be aware that if you develop new symptoms, such as a fever, leg weakness, difficulty with or loss of control of your urine or bowels, abdominal pain, or more severe pain, you will need to seek medical attention and  / or return to the Emergency department.

## 2016-01-19 NOTE — ED Notes (Signed)
Bed: WA17 Expected date:  Expected time:  Means of arrival:  Comments: EMS-back injury

## 2016-01-19 NOTE — ED Provider Notes (Signed)
[facility]-EMERGENCY DEPT Provider Note   CSN: [id number] Arrival date & time: 01/19/16  1045     History   Chief Complaint Chief Complaint  Patient presents with  . Fall  . Back Pain    HPI George Villa is a 50 y.o. male.  The history is provided by the patient and medical records. No language interpreter was used.  Fall   Back Pain   Pertinent negatives include no fever and no numbness.   George Villa is a 50 y.o. male  with a PMH of stroke with residual left leg weakness and chronic low back pain who presents to the Emergency Department complaining of aching low back pain after a mechanical fall just prior to arrival. Patient states he tripped while walking outside and fell onto his low back / buttocks area. No medication taken prior to arrival for symptoms, although he does take 10mg  oxycodone four times daily for his chronic low back pain followed by Neurosurgery at [facility]. Denies numbness, tingling, saddle anesthesia, bowel/bladder incontinence, fevers, IVDU or hx of cancer. No neck pain or upper back pain. No head injury or LOC.   Past Medical History:  Diagnosis Date  . Acid reflux   . 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  . Chronic lower back pain   . Headache    "monthly" (02/18/2014)  . Hypertension   . 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 Stroke [facility]) 2006   "left leg weak since" (02/18/2014)    Patient Active Problem List   Diagnosis Date Noted  . Abnormality of gait 12/26/2015  . Left spastic hemiparesis (HCC) 12/26/2015  . Chronic back pain 09/28/2015  . History of stroke   . Hemianopia, homonymous, right   . Stroke (HCC) 09/27/2015  . Acute CVA (cerebrovascular accident) (HCC) 09/27/2015  . Hyperlipidemia 07/16/2014  . Iron deficiency anemia 07/16/2014  . Prolonged Q-T interval on ECG 07/16/2014  . CVA (cerebral infarction) 07/16/2014  . Acute  ischemic left PCA stroke (HCC) 07/15/2014  . CAD S/P PCI of dRCA - 2 DES (2.25 x 28 Promus Premier & 2.75 x 28 Promus Premier -- postdilated to 2.75 mm) - distal RCA branches small & diffusely diseased. 02/18/2014    Class: Status post  . History of ST elevation myocardial infarction (STEMI) 02/17/2014  . Essential hypertension 02/17/2014  . History of cerebellar hemorrhage 2006 02/17/2014  . GERD (gastroesophageal reflux disease) 02/17/2014  . Hyperglycemia 02/17/2014  . Obesity 02/17/2014  . ST elevation myocardial infarction (STEMI) involving right coronary artery in recovery phase (HCC) 02/17/2014    Past Surgical History:  Procedure Laterality Date  . BACK SURGERY    . BRAIN HEMATOMA EVACUATION  2006  . LEFT HEART CATHETERIZATION WITH CORONARY ANGIOGRAM N/A 02/17/2014   Procedure: LEFT HEART CATHETERIZATION WITH CORONARY ANGIOGRAM;  Surgeon: [name], MD;  Location: [facility] CATH LAB;  Service: Cardiovascular;  Laterality: N/A;  . LUMBAR DISC SURGERY  2013  . PERCUTANEOUS CORONARY STENT INTERVENTION (PCI-S)  02/17/2014   Procedure: PERCUTANEOUS CORONARY STENT INTERVENTION (PCI-S);  Surgeon: [name], MD;  Location: [facility] CATH LAB;  Service: Cardiovascular;;  . TRACHEOSTOMY  05/2004  . VENTRICULOSTOMY  2006       Home Medications    Prior to Admission medications   Medication Sig Start Date End Date Taking? Authorizing Provider  clopidogrel (PLAVIX) 75 MG tablet Take 1 tablet (75 mg  total) by mouth daily. 09/28/15  Yes Catarina Hartshornavid Tat, MD  lisinopril (PRINIVIL,ZESTRIL) 20 MG tablet Take 20 mg by mouth daily. 09/19/15  Yes Historical Provider, MD  nitroGLYCERIN (NITROSTAT) 0.4 MG SL tablet Place 1 tablet (0.4 mg total) under the tongue every 5 (five) minutes as needed for chest pain. 02/19/14  Yes Brittainy Sherlynn CarbonM Simmons, PA-C  oxyCODONE-acetaminophen (PERCOCET) 10-325 MG tablet Take 1 tablet by mouth 4 (four) times daily as needed for pain. 01/12/16 01/11/17 Yes Historical Provider,  MD  pantoprazole (PROTONIX) 40 MG tablet Take 40 mg by mouth daily.  11/18/15 11/17/16 Yes Historical Provider, MD  atorvastatin (LIPITOR) 40 MG tablet Take 1 tablet (40 mg total) by mouth daily at 6 PM. Patient not taking: Reported on 01/19/2016 09/28/15   Catarina Hartshornavid Tat, MD  methocarbamol (ROBAXIN) 500 MG tablet Take 1 tablet (500 mg total) by mouth 2 (two) times daily as needed for muscle spasms. 01/19/16   Chase PicketJaime Pilcher Riely Baskett, PA-C    Family History Family History  Problem Relation Age of Onset  . Heart Problems Father   . Hypertension Mother   . Stroke      Social History Social History  Substance Use Topics  . Smoking status: Never Smoker  . Smokeless tobacco: Never Used  . Alcohol use 3.6 oz/week    6 Cans of beer per week     Comment: OCCASSIONALLY      Allergies   Review of patient's allergies indicates no known allergies.   Review of Systems Review of Systems  Constitutional: Negative for fever.  Musculoskeletal: Positive for back pain. Negative for neck pain.  Skin: Negative for wound.  Neurological: Negative for numbness.     Physical Exam Updated Vital Signs BP 158/100 (BP Location: Right Arm)   Pulse 86   Temp 98.7 F (37.1 C) (Oral)   Resp 16   Ht 5\' 7"  (1.702 m)   Wt 89.8 kg   SpO2 95%   BMI 31.01 kg/m   Physical Exam  Constitutional: He is oriented to person, place, and time. He appears well-developed and well-nourished.  Resting comfortably in the bed in NAD.   Neck:  Full ROM without pain No midline tenderness No tenderness of paraspinal musculature  Cardiovascular: Normal rate, regular rhythm, normal heart sounds and intact distal pulses.   No murmur heard. Pulmonary/Chest: Effort normal and breath sounds normal. No respiratory distress. He has no wheezes. He has no rales.  Abdominal: Soft. He exhibits no distension. There is no tenderness.  Musculoskeletal:       Back:  Midline L-spine surgical scar present. TTP as depicted in image. No  noted deformities or signs of inflammation. Straight leg raises are negative bilaterally for radicular symptoms. 5/5 muscle strength of bilateral LE's.  Neurological: He is alert and oriented to person, place, and time. He has normal reflexes.  Bilateral lower extremities neurovascularly intact.  Skin: Skin is warm and dry. No rash noted. No erythema.  Nursing note and vitals reviewed.    ED Treatments / Results  Labs (all labs ordered are listed, but only abnormal results are displayed) Labs Reviewed - No data to display  EKG  EKG Interpretation None       Radiology Dg Lumbar Spine Complete  Result Date: 01/19/2016 CLINICAL DATA:  Larey SeatFell at home today.  Back pain. EXAM: LUMBAR SPINE - COMPLETE 4+ VIEW COMPARISON:  CT scan 02/28/2011. FINDINGS: Normal alignment of the lumbar vertebral bodies. No acute fracture is identified. Lower lumbar facet disease without  definite pars defects. Lumbarization of S1 is noted. The visualized bony pelvis is intact. The SI joints appear normal. Aortic calcifications are noted and are advanced for age. IMPRESSION: Normal alignment and no acute bony findings. Electronically Signed   By: Rudie Meyer M.D.   On: 01/19/2016 12:12    Procedures Procedures (including critical care time)  Medications Ordered in ED Medications  oxyCODONE-acetaminophen (PERCOCET/ROXICET) 5-325 MG per tablet 2 tablet (2 tablets Oral Given 01/19/16 1121)  methocarbamol (ROBAXIN) tablet 500 mg (500 mg Oral Given 01/19/16 1121)     Initial Impression / Assessment and Plan / ED Course  I have reviewed the triage vital signs and the nursing notes.  Pertinent labs & imaging results that were available during my care of the patient were reviewed by me and considered in my medical decision making (see chart for details).  Clinical Course   George Villa presents with back pain. Patient demonstrates 5/5 muscle strength. No saddle anesthesia, bowel or bladder incontinence, or  neuro deficits. No concern for cauda equina. No fevers or other infectious symptoms to suggest that the patient's back pain is due to an infection. Lower extremities are neurovascularly intact and patient is ambulating at baseline. I have reviewed return precautions, including the development of any of these signs or symptoms, and the patient has voiced understanding. I reviewed supportive care instructions. Patient takes oxycodone QID for chronic pain - continue this pain regimen. Will add rx for Robaxin. Follow up with his neurosurgeon. Patient voiced understanding and agreement with plan.   Final Clinical Impressions(s) / ED Diagnoses   Final diagnoses:  Bilateral low back pain without sciatica, unspecified chronicity    New Prescriptions New Prescriptions   METHOCARBAMOL (ROBAXIN) 500 MG TABLET    Take 1 tablet (500 mg total) by mouth 2 (two) times daily as needed for muscle spasms.     [facility] Tejasvi Brissett, PA-C 01/19/16 1246    Shaune Pollack, MD 01/19/16 [id number]

## 2016-01-19 NOTE — ED Triage Notes (Signed)
Pt presents via EMS with complaints of lower back pain d/t a fall.  Pt states that injury site is the site of 2 previous back surgeries and worries that he "pulled something".  BP: 180/130 RR: 16 HR: 90

## 2016-01-25 ENCOUNTER — Encounter: Payer: Self-pay | Admitting: Neurology

## 2016-01-25 ENCOUNTER — Ambulatory Visit (INDEPENDENT_AMBULATORY_CARE_PROVIDER_SITE_OTHER): Payer: Medicaid Other | Admitting: Neurology

## 2016-01-25 VITALS — BP 196/127 | HR 117 | Ht 67.0 in | Wt 196.0 lb

## 2016-01-25 DIAGNOSIS — G8114 Spastic hemiplegia affecting left nondominant side: Secondary | ICD-10-CM | POA: Diagnosis not present

## 2016-01-25 DIAGNOSIS — I639 Cerebral infarction, unspecified: Secondary | ICD-10-CM | POA: Diagnosis not present

## 2016-01-25 DIAGNOSIS — I10 Essential (primary) hypertension: Secondary | ICD-10-CM

## 2016-01-25 NOTE — Progress Notes (Signed)
**  Xeomin 100 units x 3 vials, Lot [id number], Exp 06/2017, NDC 0259-1610-01, office supply//mck,rn.** 

## 2016-01-25 NOTE — Progress Notes (Signed)
PATIENT: George Villa DOB: [birth year]  Chief Complaint  Patient presents with  . Left Spastic Hemiplegia    Xeomin 300 units - office supply     HISTORICAL  George Villa is a 50 years old right-handed male, seen in refer by Dr. Pearlean BrownieSethi for evaluation of EMG guided botulism toxin injection for, his primary care physician is Dr.  Lahoma Crockerittana Phoncharoensri, MD  He had a history of hypertension, coronary artery disease (status post PCI 2 drug-eluting stents), GERD, chronic low back pain,   He had multiple strokes in the past, first stroke was in 2006, he presented with left leg weakness, second stroke stroke in 2015, no significant deficit notice, he ambulate with a cane since initial stroke in 2006, he had third stroke in April 2016, presented with new onset right visual distortion, now mostly recovered. Stroke again in June 2016, with mildly increased gait abnormality  He can drive without much difficulty, he lives by himself, he is independent of daily activity.  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  Transthoracic echo showed normal ejection fraction.  Repeat MRI scan in June 2016 showed small left coronary radiata and right temporal white matter lacunar infarct. MRA of the brain showed no large vessel intracranial stenosis. Transthoracic echo showed normal ejection fraction with severe left ventricular hypertrophy. LDL cholesterol 1110 mg percent and hemoglobin A1c in April 2017 was 5.9.   Patient was started on aspirin and Plavix for few months, now on Plavix treatment alone  UPDATE Oct 18th 2017: Patient came for his first EMG guided botulism toxin injection, he was noted to have significant elevated blood  pressure 190/110, which continues to be elevated upon repeat testing   He reported noncompliance with his blood pressure medications, has stopped taking it.  REVIEW OF SYSTEMS: Full 14 system review of systems performed and notable only for a day: gait abnormality   ALLERGIES:  No Known Allergies  HOME MEDICATIONS: Current Outpatient Prescriptions  Medication Sig Dispense Refill  . atorvastatin (LIPITOR) 40 MG tablet Take 1 tablet (40 mg total) by mouth daily at 6 PM. 30 tablet 1  . clopidogrel (PLAVIX) 75 MG tablet Take 1 tablet (75 mg total) by mouth daily. 30 tablet 1  . IncobotulinumtoxinA (XEOMIN IM) Inject 300 Units into the muscle every 3 (three) months.    Marland Kitchen. lisinopril (PRINIVIL,ZESTRIL) 20 MG tablet Take 20 mg by mouth daily.  0  . methocarbamol (ROBAXIN) 500 MG tablet Take 1 tablet (500 mg total) by mouth 2 (two) times daily as needed for muscle spasms. 12 tablet 0  . nitroGLYCERIN (NITROSTAT) 0.4 MG SL tablet Place 1 tablet (0.4 mg total) under the tongue every 5 (five) minutes as needed for chest pain. 25 tablet 2  . oxyCODONE-acetaminophen (PERCOCET) 10-325 MG tablet Take 1 tablet by mouth 4 (four) times daily as needed for pain.    . pantoprazole (PROTONIX) 40 MG tablet Take 40 mg by mouth daily.      No current facility-administered medications for this visit.     PAST MEDICAL HISTORY: Past Medical History:  Diagnosis Date  . Acid reflux   . 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  .Marland Kitchen  Chronic lower back pain   . Headache    "monthly" (02/18/2014)  . Hypertension   . 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 Stroke [facility]) 2006   "left leg weak since" (02/18/2014)    PAST SURGICAL HISTORY: Past Surgical History:  Procedure Laterality Date  . BACK SURGERY    . BRAIN HEMATOMA EVACUATION  2006  . LEFT HEART CATHETERIZATION WITH CORONARY ANGIOGRAM N/A 02/17/2014   Procedure: LEFT HEART  CATHETERIZATION WITH CORONARY ANGIOGRAM;  Surgeon: [name], MD;  Location: [facility] CATH LAB;  Service: Cardiovascular;  Laterality: N/A;  . LUMBAR DISC SURGERY  2013  . PERCUTANEOUS CORONARY STENT INTERVENTION (PCI-S)  02/17/2014   Procedure: PERCUTANEOUS CORONARY STENT INTERVENTION (PCI-S);  Surgeon: [name], MD;  Location: [facility] CATH LAB;  Service: Cardiovascular;;  . TRACHEOSTOMY  05/2004  . VENTRICULOSTOMY  2006    FAMILY HISTORY: Family History  Problem Relation Age of Onset  . Heart Problems Father   . Hypertension Mother   . Stroke      SOCIAL HISTORY:  Social History   Social History  . Marital status: Single    Spouse name: N/A  . Number of children: N/A  . Years of education: N/A   Occupational History  . umemployed      Works in [employer]    Social History Main Topics  . Smoking status: Never Smoker  . Smokeless tobacco: Never Used  . Alcohol use 3.6 oz/week    6 Cans of beer per week     Comment: OCCASSIONALLY   . Drug use: No  . Sexual activity: Yes   Other Topics Concern  . Not on file   Social History Narrative   7 cups of caffeine a week      PHYSICAL EXAM   Vitals:   01/25/16 1520  BP: (!) 196/127  Pulse: (!) 117  Weight: 196 lb (88.9 kg)  Height: 5\' 7"  (1.702 m)    Not recorded      Body mass index is 30.7 kg/m.  PHYSICAL EXAMNIATION:  Gen: NAD, conversant, well nourised, obese, well groomed                     Cardiovascular: Regular rate rhythm, no peripheral edema, warm, nontender. Eyes: Conjunctivae clear without exudates or hemorrhage Neck: Supple, no carotid bruise. Pulmonary: Clear to auscultation bilaterally   NEUROLOGICAL EXAM:  MENTAL STATUS: Speech:    Speech is normal; fluent and spontaneous with normal comprehension.  Cognition:     Orientation to time, place and person     Normal recent and remote memory     Normal Attention span and concentration     Normal Language, naming, repeating,spontaneous  speech     Fund of knowledge   CRANIAL NERVES: CN II: Visual fields are full to confrontation. Fundoscopic exam is normal with sharp discs and no vascular changes. Pupils are round equal and briskly reactive to light. CN III, IV, VI: extraocular movement are normal. Smooth pursuit was broken up into small catch-up set, with dysmetria on eye movement. CN V: Facial sensation is intact to pinprick in all 3 divisions bilaterally. Corneal responses are intact.  CN VII: Face is symmetric with normal eye closure and smile. CN VIII: Hearing is normal to rubbing fingers CN IX, X: Palate elevates symmetrically. Phonation is normal. CN XI: Head turning and shoulder shrug are intact CN XII: Tongue is midline with normal movements and no atrophy.  MOTOR: He has mild left arm pronation drift, fixation on rapid rotating movement, he has mild left knee flexion weakness, left knee extension ankle dorsiflexion weakness,   REFLEXES: Reflexes are 3 and hyperreflexia of left upper and lower extremity, left Babinski sign was present  SENSORY: Intact to light touch, pinprick, positional sensation and vibratory sensation are intact in fingers and toes.  COORDINATION: He has significant dysmetric on left finger-to-nose, heel-to-shin,  GAIT/STANCE: He needs push up to get up from seated position, has a tendency to hyperextend his left knee, difficulty with left knee flexion, clear his left foot from the floor  Romberg is absent.   DIAGNOSTIC DATA (LABS, IMAGING, TESTING) - I reviewed patient records, labs, notes, testing and imaging myself where available.   ASSESSMENT AND PLAN  RISHAAN GUNNER is a 50 y.o. male   multiple recurrent stroke, with residual spastic left hemiparesis, lower extremity more than upper extremity, dysmetric on left upper and lower extremity  He came into have EMG guided botulism toxin injection for his spastic left hemiparesis, but noted to have significant elevated blood  pressure 190/110, noncompliance with his medications,  I have advised him to go back on his previous hypertension medications, lisinopril 20 mg daily,  Document blood pressure daily  Contact his primary care physician for blood pressure medication adjustment  He also had vascular risk factors of hyperlipidemia, continue Plavix daily   Levert Feinstein, M.D. Ph.D.  [facility] [address] [place], [place] [address] Ph: [phone] Fax: [phone]  CC: Referring Provider

## 2016-02-15 ENCOUNTER — Ambulatory Visit (INDEPENDENT_AMBULATORY_CARE_PROVIDER_SITE_OTHER): Payer: Medicaid Other | Admitting: Neurology

## 2016-02-15 ENCOUNTER — Encounter: Payer: Self-pay | Admitting: Neurology

## 2016-02-15 VITALS — BP 154/104 | HR 76 | Ht 67.0 in | Wt 196.0 lb

## 2016-02-15 DIAGNOSIS — G8114 Spastic hemiplegia affecting left nondominant side: Secondary | ICD-10-CM | POA: Diagnosis not present

## 2016-02-15 DIAGNOSIS — Z8673 Personal history of transient ischemic attack (TIA), and cerebral infarction without residual deficits: Secondary | ICD-10-CM

## 2016-02-15 MED ORDER — INCOBOTULINUMTOXINA 100 UNITS IM SOLR
300.0000 [IU] | INTRAMUSCULAR | Status: DC
  Administered 2016-02-15: 300 [IU] via INTRAMUSCULAR

## 2016-02-15 NOTE — Progress Notes (Signed)
**  Xeomin 100 units x 3 vials, Lot [id number], Exp 06/2017, NDC 0259-1610-01, office supply//mck,rn.** 

## 2016-02-15 NOTE — Progress Notes (Addendum)
PATIENT: George Villa DOB: July 25, 1965  Chief Complaint  Patient presents with  . Left Spastic Hemiplegia    Xeomin 300 units - office supply     HISTORICAL  George Villa is a 50 years old right-handed male, seen in refer by Dr. Pearlean BrownieSethi for evaluation of EMG guided botulism toxin injection for, his primary care physician is Dr.  Lahoma Crockerittana Phoncharoensri, MD  He had a history of hypertension, coronary artery disease (status post PCI 2 drug-eluting stents), GERD, chronic low back pain,   He had multiple strokes in the past, first stroke was in 2006, he presented with left leg weakness, second stroke stroke in 2015, no significant deficit notice, he ambulate with a cane since initial stroke in 2006, he had third stroke in April 2016, presented with new onset right visual distortion, now mostly recovered. Stroke again in June 2016, with mildly increased gait abnormality  He can drive without much difficulty, he lives by himself, he is independent of daily activity.  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  Transthoracic echo showed normal ejection fraction.  Repeat MRI scan in June 2016 showed small left coronary radiata and right temporal white matter lacunar infarct. MRA of the brain showed no large vessel intracranial stenosis. Transthoracic echo showed normal ejection fraction with severe left ventricular hypertrophy. LDL cholesterol 1110 mg percent and hemoglobin A1c in April 2017 was 5.9.   Patient was started on aspirin and Plavix for few months, now on Plavix treatment alone  UPDATE Oct 18th 2017: Patient came for his first EMG guided botulism toxin injection, he was noted to have significant elevated blood  pressure 190/110, which continues to be elevated upon repeat testing   He reported noncompliance with his blood pressure medications, has stopped taking it.  UPDATE Nov 8th 2017: He was noted to have mild elevated blood pressure again today, repeat checking around 150/105, he has been taking his lisinopril 20 mg daily,  He was noted to have spastic left hemiparesis, have a tendency of forceful left knee extension, dragging left foot across the floor, weakness with left ankle dorsi flexion, tendency for left ankle staying plantar flexion mild limited range of motion of left ankle   REVIEW OF SYSTEMS: Full 14 system review of systems performed and notable only for a day: gait abnormality   ALLERGIES:  No Known Allergies  HOME MEDICATIONS: Current Outpatient Prescriptions  Medication Sig Dispense Refill  . atorvastatin (LIPITOR) 40 MG tablet Take 1 tablet (40 mg total) by mouth daily at 6 PM. 30 tablet 1  . clopidogrel (PLAVIX) 75 MG tablet Take 1 tablet (75 mg total) by mouth daily. 30 tablet 1  . IncobotulinumtoxinA (XEOMIN IM) Inject 300 Units into the muscle every 3 (three) months.    Marland Kitchen. lisinopril (PRINIVIL,ZESTRIL) 20 MG tablet Take 20 mg by mouth daily.  0  . methocarbamol (ROBAXIN) 500 MG tablet Take 1 tablet (500 mg total) by mouth 2 (two) times daily as needed for muscle spasms. 12 tablet 0  . nitroGLYCERIN (NITROSTAT) 0.4 MG SL tablet Place 1 tablet (0.4 mg total) under the tongue every 5 (five) minutes as needed for chest pain. 25 tablet 2  . oxyCODONE-acetaminophen (PERCOCET) 10-325 MG tablet Take 1 tablet  by mouth 4 (four) times daily as needed for pain.    . pantoprazole (PROTONIX) 40 MG tablet Take 40 mg by mouth daily.      No current facility-administered medications for this visit.     PAST MEDICAL HISTORY: Past Medical History:  Diagnosis Date  . Acid reflux   . 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 05/2004 associated with hydrocephalus s/p evacuation and  ventriculostomy.  . Chronic lower back pain   . Headache    "monthly" (02/18/2014)  . Hypertension   . 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 Stroke [facility]   "left leg weak since" (02/18/2014)    PAST SURGICAL HISTORY: Past Surgical History:  Procedure Laterality Date  . BACK SURGERY    . BRAIN HEMATOMA EVACUATION  2006  . LEFT HEART CATHETERIZATION WITH CORONARY ANGIOGRAM N/A 02/17/2014   Procedure: LEFT HEART CATHETERIZATION WITH CORONARY ANGIOGRAM;  Surgeon: Lesleigh NoeHenry W Troop III, MD;  [facility];  Service: Cardiovascular;  Laterality: N/A;  . LUMBAR DISC SURGERY  2013  . PERCUTANEOUS CORONARY STENT INTERVENTION (PCI-S)  02/17/2014   Procedure: PERCUTANEOUS CORONARY STENT INTERVENTION (PCI-S);  Surgeon: Lesleigh NoeHenry W Chevere III, MD;  [facility];  Service: Cardiovascular;;  . TRACHEOSTOMY  05/2004  . VENTRICULOSTOMY  2006    FAMILY HISTORY: Family History  Problem Relation Age of Onset  . Heart Problems Father   . Hypertension Mother   . Stroke      SOCIAL HISTORY:  Social History   Social History  . Marital status: Single    Spouse name: N/A  . Number of children: N/A  . Years of education: N/A   Occupational History  . umemployed      Works in [employer]    Social History Main Topics  . Smoking status: Never Smoker  . Smokeless tobacco: Never Used  . Alcohol use 3.6 oz/week    6 Cans of beer per week     Comment: OCCASSIONALLY   . Drug use: No  . Sexual activity: Yes   Other Topics Concern  . Not on file   Social History Narrative   7 cups of caffeine a week      PHYSICAL EXAM   Vitals:   02/15/16 1344  BP: (!) 154/104  Pulse: 76  Weight: 196 lb (88.9 kg)  Height: 5\' 7"  (1.702 m)    Not recorded      Body mass index is 30.7 kg/m.  PHYSICAL EXAMNIATION:  Gen: NAD, conversant, well nourised, obese, well groomed                     Cardiovascular: Regular rate rhythm, no peripheral edema, warm,  nontender. Eyes: Conjunctivae clear without exudates or hemorrhage Neck: Supple, no carotid bruise. Pulmonary: Clear to auscultation bilaterally   NEUROLOGICAL EXAM:  MENTAL STATUS: Speech:    Speech is normal; fluent and spontaneous with normal comprehension.  Cognition:     Orientation to time, place and person     Normal recent and remote memory     Normal Attention span and concentration     Normal Language, naming, repeating,spontaneous speech     Fund of knowledge   CRANIAL NERVES: CN II: Visual fields are full to confrontation. Fundoscopic exam is normal with sharp discs and no vascular changes. Pupils are round equal and briskly reactive to light. CN III, IV, VI: extraocular movement are normal. Smooth pursuit was broken up into small  catch-up set, with dysmetria on eye movement. CN V: Facial sensation is intact to pinprick in all 3 divisions bilaterally. Corneal responses are intact.  CN VII: Face is symmetric with normal eye closure and smile. CN VIII: Hearing is normal to rubbing fingers CN IX, X: Palate elevates symmetrically. Phonation is normal. CN XI: Head turning and shoulder shrug are intact CN XII: Tongue is midline with normal movements and no atrophy.  MOTOR: He has mild left arm pronation drift, fixation on rapid rotating movement, he has mild left knee flexion weakness, left knee extension ankle dorsiflexion weakness,   REFLEXES: Reflexes are 3 and hyperreflexia of left upper and lower extremity, left Babinski sign was present  SENSORY: Intact to light touch, pinprick, positional sensation and vibratory sensation are intact in fingers and toes.  COORDINATION: He has significant dysmetric on left finger-to-nose, heel-to-shin,  GAIT/STANCE: He needs push up to get up from seated position, has a tendency to hyperextend his left knee, difficulty with left knee flexion, clear his left foot from the floor  Romberg is absent.   DIAGNOSTIC DATA (LABS,  IMAGING, TESTING) - I reviewed patient records, labs, notes, testing and imaging myself where available.   ASSESSMENT AND PLAN  George Villa is a 50 y.o. male   multiple recurrent stroke, with residual spastic left hemiparesis, lower extremity more than upper extremity, dysmetric on left upper and lower extremity  He came into have EMG guided botulism toxin injection for his spastic left hemiparesis this is his first injection on February 15 2016,  used xeomin 300 units, under electric stimulation   Left vastus lateralis 25 units   left femoris rectus 25 units  Left tibialis posterior 75 units Left flexor digitorum longus 75 units  Left abductor longus 50 units Left abductor magnus 50 units  George Villa, M.D. Ph.D.  [facility] [address] [place], [place] [address] Ph: [phone] Fax: [phone]  CC: Referring Provider

## 2016-04-18 DIAGNOSIS — I10 Essential (primary) hypertension: Secondary | ICD-10-CM | POA: Diagnosis not present

## 2016-05-23 ENCOUNTER — Other Ambulatory Visit: Payer: Self-pay | Admitting: *Deleted

## 2016-05-23 ENCOUNTER — Ambulatory Visit (INDEPENDENT_AMBULATORY_CARE_PROVIDER_SITE_OTHER): Payer: Medicaid Other | Admitting: Neurology

## 2016-05-23 ENCOUNTER — Telehealth: Payer: Self-pay | Admitting: Neurology

## 2016-05-23 ENCOUNTER — Encounter: Payer: Self-pay | Admitting: Neurology

## 2016-05-23 VITALS — BP 151/98 | HR 88 | Ht 67.0 in | Wt 195.0 lb

## 2016-05-23 DIAGNOSIS — I639 Cerebral infarction, unspecified: Secondary | ICD-10-CM

## 2016-05-23 DIAGNOSIS — G8114 Spastic hemiplegia affecting left nondominant side: Secondary | ICD-10-CM

## 2016-05-23 DIAGNOSIS — R269 Unspecified abnormalities of gait and mobility: Secondary | ICD-10-CM

## 2016-05-23 NOTE — Progress Notes (Signed)
PATIENT: George Villa DOB: [birth year]  Chief Complaint  Patient presents with  . Left spastic hemiparesis    Xeomin 300 units - specialty pharmacy     HISTORICAL  George Villa is a 51 years old right-handed male, seen in refer by Dr. Pearlean Brownie for evaluation of EMG guided botulism toxin injection for, his primary care physician is Dr.  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 Stroke again in June 2016, with mildly increased gait abnormality  He can drive without much difficulty, he lives by himself, he is independent of daily activity.  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  Transthoracic echo showed normal ejection fraction.  Repeat MRI scan in June 2016 showed small left coronary radiata and right temporal white matter lacunar infarct. MRA of the brain showed no large vessel intracranial stenosis. Transthoracic echo showed normal ejection fraction with severe left ventricular hypertrophy. LDL cholesterol 1110 mg percent and hemoglobin A1c in April 2017 was 5.9.   Patient was started on aspirin and Plavix for few months, now on Plavix treatment alone  UPDATE Oct 18th 2017: Patient came for his first EMG guided botulism toxin injection, he was noted to have significant elevated blood  pressure 190/110, which continues to be elevated upon repeat testing   He reported noncompliance with his blood pressure medications, has stopped taking it.  UPDATE Nov 8th 2017: He was noted to have mild elevated blood pressure again today, repeat checking around 150/105, he has been taking his lisinopril 20 mg daily,  He was noted to have spastic left hemiparesis, have a tendency of forceful left knee extension, dragging left foot across the floor, weakness with left ankle dorsi flexion, tendency for left ankle staying plantar flexion mild limited range of motion of left ankle  UPDATE May 23 2016: He received his first electrical stimulation guided the only injection in November 2017, no significant side effect noticed, he did not notice significant benefit either.   REVIEW OF SYSTEMS: Full 14 system review of systems performed and notable only for a day: gait abnormality   ALLERGIES:  No Known Allergies  HOME MEDICATIONS: Current Outpatient Prescriptions  Medication Sig Dispense Refill  . atorvastatin (LIPITOR) 40 MG tablet Take 1 tablet (40 mg total) by mouth daily at 6 PM. 30 tablet 1  . clopidogrel (PLAVIX) 75 MG tablet Take 1 tablet (75 mg total) by mouth daily. 30 tablet 1  . IncobotulinumtoxinA (XEOMIN IM) Inject 300 Units into the muscle every 3 (three) months.    Marland Kitchen lisinopril (PRINIVIL,ZESTRIL) 20 MG tablet Take 20 mg by mouth daily.  0  . methocarbamol (ROBAXIN) 500 MG tablet Take 1 tablet (500 mg total) by mouth 2 (two) times daily as needed for muscle spasms. 12 tablet 0  . nitroGLYCERIN (NITROSTAT) 0.4 MG SL tablet Place  1 tablet (0.4 mg total) under the tongue every 5 (five) minutes as needed for chest pain. 25 tablet 2  . oxyCODONE-acetaminophen (PERCOCET) 10-325 MG tablet Take 1 tablet by mouth 4 (four) times daily as needed for pain.    . pantoprazole (PROTONIX) 40 MG tablet Take 40 mg by mouth daily.      No current facility-administered medications for this visit.      PAST MEDICAL HISTORY: Past Medical History:  Diagnosis Date  . Acid reflux   . 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  . Chronic lower back pain   . Headache    "monthly" (02/18/2014)  . Hypertension   . 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 Stroke [facility]) 2006   "left leg weak since" (02/18/2014)    PAST SURGICAL HISTORY: Past Surgical History:  Procedure Laterality Date  . BACK SURGERY    . BRAIN HEMATOMA EVACUATION  2006  . LEFT HEART CATHETERIZATION WITH CORONARY ANGIOGRAM N/A 02/17/2014   Procedure: LEFT HEART CATHETERIZATION WITH CORONARY ANGIOGRAM;  Surgeon: [name], MD;  Location: [facility] CATH LAB;  Service: Cardiovascular;  Laterality: N/A;  . LUMBAR DISC SURGERY  2013  . PERCUTANEOUS CORONARY STENT INTERVENTION (PCI-S)  02/17/2014   Procedure: PERCUTANEOUS CORONARY STENT INTERVENTION (PCI-S);  Surgeon: [name], MD;  Location: [facility] CATH LAB;  Service: Cardiovascular;;  . TRACHEOSTOMY  05/2004  . VENTRICULOSTOMY  2006    FAMILY HISTORY: Family History  Problem Relation Age of Onset  . Heart Problems Father   . Hypertension Mother   . Stroke      SOCIAL HISTORY:  Social History   Social History  . Marital status: Single    Spouse name: N/A  . Number of children: N/A  . Years of education: N/A   Occupational History  . umemployed      Works in [employer]    Social History Main Topics  . Smoking status: Never Smoker  . Smokeless tobacco: Never Used  . Alcohol use 3.6 oz/week    6 Cans of beer per week     Comment: OCCASSIONALLY   . Drug use: No  . Sexual activity: Yes   Other Topics Concern  . Not on file   Social History Narrative   7 cups of caffeine a week      PHYSICAL EXAM   Vitals:   05/23/16 1511  BP: (!) 151/98  Pulse: 88  Weight: 195 lb (88.5 kg)  Height: 5\' 7"  (1.702 m)    Not recorded      Body mass index is 30.54  kg/m.  PHYSICAL EXAMNIATION:  Gen: NAD, conversant, well nourised, obese, well groomed                     Cardiovascular: Regular rate rhythm, no peripheral edema, warm, nontender. Eyes: Conjunctivae clear without exudates or hemorrhage Neck: Supple, no carotid bruise. Pulmonary: Clear to auscultation bilaterally   NEUROLOGICAL EXAM:  MENTAL STATUS: Speech:    Speech is normal; fluent and spontaneous with normal comprehension.  Cognition:     Orientation to time, place and person     Normal recent and remote memory     Normal Attention span and concentration     Normal Language, naming, repeating,spontaneous speech     Fund of knowledge   CRANIAL NERVES: CN II: Visual fields are full to confrontation. Fundoscopic exam is normal with  sharp discs and no vascular changes. Pupils are round equal and briskly reactive to light. CN III, IV, VI: extraocular movement are normal. Smooth pursuit was broken up into small catch-up set, with dysmetria on eye movement. CN V: Facial sensation is intact to pinprick in all 3 divisions bilaterally. Corneal responses are intact.  CN VII: Face is symmetric with normal eye closure and smile. CN VIII: Hearing is normal to rubbing fingers CN IX, X: Palate elevates symmetrically. Phonation is normal. CN XI: Head turning and shoulder shrug are intact CN XII: Tongue is midline with normal movements and no atrophy.  MOTOR: He has mild left arm pronation drift, fixation on rapid rotating movement, he has mild left knee flexion weakness, left knee extension ankle dorsiflexion weakness,   REFLEXES: Reflexes are 3 and hyperreflexia of left upper and lower extremity, left Babinski sign was present  SENSORY: Intact to light touch, pinprick, positional sensation and vibratory sensation are intact in fingers and toes.  COORDINATION: He has significant dysmetric on left finger-to-nose, heel-to-shin,  GAIT/STANCE: He needs push up to get up from seated  position, has a tendency to hyperextend his left knee, difficulty with left knee flexion, clear his left foot from the floor  Romberg is absent.   DIAGNOSTIC DATA (LABS, IMAGING, TESTING) - I reviewed patient records, labs, notes, testing and imaging myself where available.   ASSESSMENT AND PLAN  Pricilla Larssonnthony G Maloof is a 51 y.o. male   multiple recurrent stroke, with residual spastic left hemiparesis, lower extremity more than upper extremity, dysmetric on left upper and lower extremity   EMG guided botulism toxin A injection,   used xeomin 300 units, under electric stimulation   Left vastus lateralis 50 units  Left femoris rectus 25 units Left vastus medialis 50 units Left rectus intermedialis 25 units  Left tibialis posterior 75 units Left flexor digitorum longus 75 units  He tolerated injection well will return to clinic in 3 months for repeat injection  Levert FeinsteinYijun Wilson Dusenbery, M.D. Ph.D.  [facility] [address], [address] [place], [place] [address] Ph: [phone] Fax: [phone]  CC: Referring Provider

## 2016-05-23 NOTE — Telephone Encounter (Signed)
PT NEED 3 MONTH BOTOX PER YAN. PT # [phone].

## 2016-05-23 NOTE — Progress Notes (Signed)
**  Xeomin 100 units x 3 vials, NDC [id number], Lot [id number], Exp 05/2018, specialty pharmacy.//mck,rn**

## 2016-05-24 NOTE — Telephone Encounter (Signed)
Called and scheduled

## 2016-07-16 ENCOUNTER — Encounter (HOSPITAL_COMMUNITY): Payer: Self-pay | Admitting: *Deleted

## 2016-07-16 ENCOUNTER — Emergency Department (HOSPITAL_COMMUNITY)
Admission: EM | Admit: 2016-07-16 | Discharge: 2016-07-16 | Disposition: A | Discharge status: Home / Self Care | Payer: Medicaid Other | Attending: Emergency Medicine | Admitting: Emergency Medicine

## 2016-07-16 ENCOUNTER — Emergency Department (HOSPITAL_COMMUNITY): Payer: Medicaid Other

## 2016-07-16 DIAGNOSIS — Y999 Unspecified external cause status: Secondary | ICD-10-CM | POA: Insufficient documentation

## 2016-07-16 DIAGNOSIS — S52351A Displaced comminuted fracture of shaft of radius, right arm, initial encounter for closed fracture: Secondary | ICD-10-CM | POA: Diagnosis not present

## 2016-07-16 DIAGNOSIS — Y929 Unspecified place or not applicable: Secondary | ICD-10-CM | POA: Insufficient documentation

## 2016-07-16 DIAGNOSIS — S59911A Unspecified injury of right forearm, initial encounter: Secondary | ICD-10-CM | POA: Diagnosis present

## 2016-07-16 DIAGNOSIS — T1490XA Injury, unspecified, initial encounter: Secondary | ICD-10-CM

## 2016-07-16 DIAGNOSIS — S52571A Other intraarticular fracture of lower end of right radius, initial encounter for closed fracture: Secondary | ICD-10-CM

## 2016-07-16 DIAGNOSIS — W1830XA Fall on same level, unspecified, initial encounter: Secondary | ICD-10-CM | POA: Insufficient documentation

## 2016-07-16 DIAGNOSIS — Z8673 Personal history of transient ischemic attack (TIA), and cerebral infarction without residual deficits: Secondary | ICD-10-CM | POA: Diagnosis not present

## 2016-07-16 DIAGNOSIS — Y939 Activity, unspecified: Secondary | ICD-10-CM | POA: Diagnosis not present

## 2016-07-16 DIAGNOSIS — I252 Old myocardial infarction: Secondary | ICD-10-CM | POA: Diagnosis not present

## 2016-07-16 DIAGNOSIS — I10 Essential (primary) hypertension: Secondary | ICD-10-CM | POA: Diagnosis not present

## 2016-07-16 DIAGNOSIS — Z79899 Other long term (current) drug therapy: Secondary | ICD-10-CM | POA: Diagnosis not present

## 2016-07-16 MED ORDER — OXYCODONE-ACETAMINOPHEN 5-325 MG PO TABS
2.0000 | ORAL_TABLET | Freq: Once | ORAL | Status: AC
  Administered 2016-07-16: 2 via ORAL
  Filled 2016-07-16: qty 2

## 2016-07-16 NOTE — ED Triage Notes (Signed)
Pt brought in by EMS amb, c/o fall with right wrist injury x 1 day ago , pt is on blood thinners denies head injury

## 2016-07-16 NOTE — ED Provider Notes (Signed)
[facility]-EMERGENCY DEPT Provider Note   CSN: [id number] Arrival date & time: 07/16/16  0850     History   Chief Complaint Chief Complaint  Patient presents with  . Wrist Injury    HPI George Villa is a 51 y.o. male with history of chronic back pain who presents following a fall with right wrist pain. Patient states he fell on outstretched hand yesterday. Patient did not hit his head or lose consciousness. Patient has had pain and swelling to his right wrist that is unresolved with ice and his at-home Percocet for his chronic back pain. Patient denies any numbness or tingling. He is able to move all digits freely without difficulty. He denies any other symptoms.  HPI  Past Medical History:  Diagnosis Date  . Acid reflux   . 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  . Chronic lower back pain   . Headache    "monthly" (02/18/2014)  . Hypertension   . 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 Stroke [facility]) 2006   "left leg weak since" (02/18/2014)    Patient Active Problem List   Diagnosis Date Noted  . Abnormality of gait 12/26/2015  . Spastic hemiparesis of left nondominant side (HCC) 12/26/2015  . Chronic back pain 09/28/2015  . History of stroke   . Hemianopia, homonymous, right   . Stroke (HCC) 09/27/2015  . Acute CVA (cerebrovascular accident) (HCC) 09/27/2015  . Hyperlipidemia 07/16/2014  . Iron deficiency anemia 07/16/2014  . Prolonged Q-T interval on ECG 07/16/2014  . CVA (cerebral infarction) 07/16/2014  . Acute ischemic left PCA stroke (HCC) 07/15/2014  . CAD S/P PCI of dRCA - 2 DES (2.25 x 28 Promus Premier & 2.75 x 28 Promus Premier -- postdilated to 2.75 mm) - distal RCA branches small & diffusely diseased. 02/18/2014    Class: Status post  . History of ST elevation myocardial infarction (STEMI) 02/17/2014  . Essential hypertension 02/17/2014  . History of cerebellar hemorrhage 2006  02/17/2014  . GERD (gastroesophageal reflux disease) 02/17/2014  . Hyperglycemia 02/17/2014  . Obesity 02/17/2014  . ST elevation myocardial infarction (STEMI) involving right coronary artery in recovery phase (HCC) 02/17/2014    Past Surgical History:  Procedure Laterality Date  . BACK SURGERY    . BRAIN HEMATOMA EVACUATION  2006  . LEFT HEART CATHETERIZATION WITH CORONARY ANGIOGRAM N/A 02/17/2014   Procedure: LEFT HEART CATHETERIZATION WITH CORONARY ANGIOGRAM;  Surgeon: [name], MD;  Location: [facility] CATH LAB;  Service: Cardiovascular;  Laterality: N/A;  . LUMBAR DISC SURGERY  2013  . PERCUTANEOUS CORONARY STENT INTERVENTION (PCI-S)  02/17/2014   Procedure: PERCUTANEOUS CORONARY STENT INTERVENTION (PCI-S);  Surgeon: [name], MD;  Location: [facility] CATH LAB;  Service: Cardiovascular;;  . TRACHEOSTOMY  05/2004  . VENTRICULOSTOMY  2006       Home Medications    Prior to Admission medications   Medication Sig Start Date End Date Taking? Authorizing Provider  atorvastatin (LIPITOR) 40 MG tablet Take 1 tablet (40 mg total) by mouth daily at 6 PM. 09/28/15   Catarina Hartshorn, MD  clopidogrel (PLAVIX) 75 MG tablet Take 1 tablet (75 mg total) by mouth daily. 09/28/15   Catarina Hartshorn, MD  IncobotulinumtoxinA (XEOMIN IM) Inject 300 Units into the muscle every 3 (three) months.    Historical Provider, MD  lisinopril (PRINIVIL,ZESTRIL) 20 MG tablet Take 20 mg by mouth daily. 09/19/15   Historical Provider,  MD  methocarbamol (ROBAXIN) 500 MG tablet Take 1 tablet (500 mg total) by mouth 2 (two) times daily as needed for muscle spasms. 01/19/16   Chase Picket Ward, PA-C  nitroGLYCERIN (NITROSTAT) 0.4 MG SL tablet Place 1 tablet (0.4 mg total) under the tongue every 5 (five) minutes as needed for chest pain. 02/19/14   Brittainy Sherlynn Carbon, PA-C  oxyCODONE-acetaminophen (PERCOCET) 10-325 MG tablet Take 1 tablet by mouth 4 (four) times daily as needed for pain. 01/12/16 01/11/17  Historical Provider, MD    pantoprazole (PROTONIX) 40 MG tablet Take 40 mg by mouth daily.  11/18/15 11/17/16  Historical Provider, MD    Family History Family History  Problem Relation Age of Onset  . Heart Problems Father   . Hypertension Mother   . Stroke      Social History Social History  Substance Use Topics  . Smoking status: Never Smoker  . Smokeless tobacco: Never Used  . Alcohol use 3.6 oz/week    6 Cans of beer per week     Comment: OCCASSIONALLY      Allergies   Patient has no known allergies.   Review of Systems Review of Systems  Constitutional: Negative for fever.  Musculoskeletal: Positive for arthralgias and back pain (chronic).  Neurological: Negative for numbness.     Physical Exam Updated Vital Signs BP (!) 143/93   Pulse 80   Temp 98.4 F (36.9 C) (Oral)   Resp 18   Ht  (1.702 m)   Wt 89.4 kg   SpO2 99%   BMI 30.85 kg/m   Physical Exam  Constitutional: He appears well-developed and well-nourished. No distress.  HENT:  Head: Normocephalic and atraumatic.  Mouth/Throat: Oropharynx is clear and moist. No oropharyngeal exudate.  Eyes: Conjunctivae are normal. Right eye exhibits no discharge. Left eye exhibits no discharge. No scleral icterus.  Neck: Normal range of motion. Neck supple.  Cardiovascular: Normal rate, regular rhythm, normal heart sounds and intact distal pulses.  Exam reveals no gallop and no friction rub.   No murmur heard. Pulmonary/Chest: Effort normal and breath sounds normal. No stridor. No respiratory distress. He has no wheezes. He has no rales.  Abdominal: Soft. Bowel sounds are normal. He exhibits no distension. There is no tenderness. There is no rebound and no guarding.  Musculoskeletal: He exhibits no edema.  R wrist: Distal radius tenderness, no distal ulnar tenderness or anatomical snuffbox tenderness; flexion, extension, abduction, adduction, and thumb opposition intact; limited range of motion with some extension due to pain; radial  pulses and intact; normal sensation; cap refill < 2 secs  Neurological: He is alert. Coordination normal.  Skin: Skin is warm and dry. No rash noted. He is not diaphoretic. No pallor.  Psychiatric: He has a normal mood and affect.  Nursing note and vitals reviewed.    ED Treatments / Results  Labs (all labs ordered are listed, but only abnormal results are displayed) Labs Reviewed - No data to display  EKG  EKG Interpretation None       Radiology Dg Wrist Complete Right  Result Date: 07/16/2016 CLINICAL DATA:  51 year old male status post fall yesterday with generalized wrist pain, soft tissue swelling. Symptoms increase with wrist movement. EXAM: RIGHT WRIST - COMPLETE 3+ VIEW COMPARISON:  Right Thumb series 8 /06/2004 FINDINGS: Comminuted but minimally displaced distal right radius fracture at the metadiaphysis. DRU involvement, but it is unclear whether the radiocarpal joint is affected. Generalized soft tissue swelling. The distal right ulna appears  intact. Right carpal bone alignment and joint spaces are normal. The scaphoid appears intact. No acute carpal or visible metacarpal fracture. IMPRESSION: 1. Comminuted but minimally displaced distal right radius fracture. DRU involvement, but it is unclear whether the radiocarpal joint is involved. 2. No other acute osseous abnormality identified about the right wrist. Electronically Signed   By: Odessa Fleming M.D.   On: 07/16/2016 09:24    Procedures Procedures (including critical care time)  SPLINT APPLICATION Date/Time: 10:30 AM Authorized by: Emi Holes Consent: Verbal consent obtained. Risks and benefits: risks, benefits and alternatives were discussed Consent given by: patient Splint applied by: orthopedic technician, Jon Location details: R wirst Splint type: short arm volar Supplies used: Ace wrap, fiberglass Post-procedure: The splinted body part was neurovascularly unchanged following the procedure. Patient tolerance:  Patient tolerated the procedure well with no immediate complications.     Medications Ordered in ED Medications  oxyCODONE-acetaminophen (PERCOCET/ROXICET) 5-325 MG per tablet 2 tablet (not administered)     Initial Impression / Assessment and Plan / ED Course  I have reviewed the triage vital signs and the nursing notes.  Pertinent labs & imaging results that were available during my care of the patient were reviewed by me and considered in my medical decision making (see chart for details).     Patient with R wrist fracture follow FOOSH yesterday. X ray shows Comminuted but minimally displaced distal right radius fracture. DRU involvement, but it is unclear whether the radiocarpal joint is involved. Splint placed in the ED. Pain control with Percocet as patient takes regularly for chronic back pain. Follow up to hand surgery, Dr. Melvyn Novas. Return precautions discussed. Patient understands and agrees with plan. Patient vitals stable throughout ED course and discharged in satisfactory condition. I discussed patient case with Dr. Effie Shy who guided the patient's management and agrees with plan.   Final Clinical Impressions(s) / ED Diagnoses   Final diagnoses:  Injury  Other closed intra-articular fracture of distal end of right radius, initial encounter    New Prescriptions New Prescriptions   No medications on file     Emi Holes, PA-C 07/16/16 1030    Mancel Bale, MD 07/17/16 1646

## 2016-07-16 NOTE — ED Notes (Signed)
Bed: WA12 Expected date:  Expected time:  Means of arrival: Ambulance Comments: EMS left wrist pain post fall on blood thinners

## 2016-07-16 NOTE — Discharge Instructions (Signed)
Treatment: You can take your at home Percocet as prescribed, as needed for your pain. You can alternate with ibuprofen as prescribed over-the-counter. Keep your splint clean and dry. Elevate your arm to help with pain and swelling as much as possible.  Follow-up: Please follow-up with the hand doctor, Dr. Melvyn Novas, within one week for further evaluation and treatment of your wrist fracture. I recommend calling his office today. Please return to the emergency department if you develop any new or worsening symptoms.

## 2016-07-16 NOTE — ED Notes (Signed)
Pt calling for ride

## 2016-07-19 DIAGNOSIS — S52551A Other extraarticular fracture of lower end of right radius, initial encounter for closed fracture: Secondary | ICD-10-CM | POA: Diagnosis not present

## 2016-08-29 ENCOUNTER — Ambulatory Visit (INDEPENDENT_AMBULATORY_CARE_PROVIDER_SITE_OTHER): Payer: Medicaid Other | Admitting: Neurology

## 2016-08-29 ENCOUNTER — Encounter: Payer: Self-pay | Admitting: Neurology

## 2016-08-29 ENCOUNTER — Encounter (INDEPENDENT_AMBULATORY_CARE_PROVIDER_SITE_OTHER): Payer: Self-pay

## 2016-08-29 VITALS — BP 138/91 | HR 109 | Ht 67.0 in | Wt 187.0 lb

## 2016-08-29 DIAGNOSIS — G8114 Spastic hemiplegia affecting left nondominant side: Secondary | ICD-10-CM

## 2016-08-29 DIAGNOSIS — I63331 Cerebral infarction due to thrombosis of right posterior cerebral artery: Secondary | ICD-10-CM

## 2016-08-29 NOTE — Progress Notes (Signed)
Xeomin 100 units x 3 vials, NDC [id number], Lot [id number], Exp 12/2018, specialty pharmacy.//mck,rn**

## 2016-08-29 NOTE — Progress Notes (Signed)
PATIENT: George Villa DOB: 07/23/1965  Chief Complaint  Patient presents with  . Left Spastic Hemiparesis    Xeomin 300 units - specialty pharmacy     HISTORICAL  George Villa is a 52 years old right-handed male, seen in refer by Dr. Pearlean Brownie for evaluation of EMG guided botulism toxin injection for, his primary care physician is Dr.  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 Stroke again in June 2016, with mildly increased gait abnormality  He can drive without much difficulty, he lives by himself, he is independent of daily activity.  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  Transthoracic echo showed normal ejection fraction.  Repeat MRI scan in June 2016 showed small left coronary radiata and right temporal white matter lacunar infarct. MRA of the brain showed no large vessel intracranial stenosis. Transthoracic echo showed normal ejection fraction with severe left ventricular hypertrophy. LDL cholesterol 1110 mg percent and hemoglobin A1c in April 2017 was 5.9.   Patient was started on aspirin and Plavix for few months, now on Plavix treatment alone  UPDATE Oct 18th 2017: Patient came for his first EMG guided botulism toxin injection, he was noted to have significant elevated blood  pressure 190/110, which continues to be elevated upon repeat testing   He reported noncompliance with his blood pressure medications, has stopped taking it.  UPDATE Nov 8th 2017: He was noted to have mild elevated blood pressure again today, repeat checking around 150/105, he has been taking his lisinopril 20 mg daily,  He was noted to have spastic left hemiparesis, have a tendency of forceful left knee extension, dragging left foot across the floor, weakness with left ankle dorsi flexion, tendency for left ankle staying plantar flexion mild limited range of motion of left ankle  UPDATE May 23 2016: He received his first electrical stimulation guided the only injection in November 2017, no significant side effect noticed, he did not notice significant benefit either.  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 atorvastatin (LIPITOR) 40 MG tablet Take 1 tablet (40 mg total) by mouth daily at 6 PM. 30 tablet 1  . clopidogrel (PLAVIX) 75 MG tablet Take 1 tablet (75 mg total) by mouth daily. 30 tablet 1  . IncobotulinumtoxinA (XEOMIN IM) Inject 300 Units into the muscle every 3 (three) months.    Marland Kitchen lisinopril (PRINIVIL,ZESTRIL) 20 MG tablet Take 20 mg by mouth daily.  0  . methocarbamol (ROBAXIN) 500 MG tablet Take 1 tablet (500  mg total) by mouth 2 (two) times daily as needed for muscle spasms. 12 tablet 0  . nitroGLYCERIN (NITROSTAT) 0.4 MG SL tablet Place 1 tablet (0.4 mg total) under the tongue every 5 (five) minutes as needed for chest pain. 25 tablet 2  . oxyCODONE-acetaminophen (PERCOCET) 10-325 MG tablet Take 1 tablet by mouth 4 (four) times  daily as needed for pain.    . pantoprazole (PROTONIX) 40 MG tablet Take 40 mg by mouth daily.      No current facility-administered medications for this visit.     PAST MEDICAL HISTORY: Past Medical History:  Diagnosis Date  . Acid reflux   . 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  . Chronic lower back pain   . Headache    "monthly" (02/18/2014)  . Hypertension   . 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 Stroke [facility]) 2006   "left leg weak since" (02/18/2014)    PAST SURGICAL HISTORY: Past Surgical History:  Procedure Laterality Date  . BACK SURGERY    . BRAIN HEMATOMA EVACUATION  2006  . LEFT HEART CATHETERIZATION WITH CORONARY ANGIOGRAM N/A 02/17/2014   Procedure: LEFT HEART CATHETERIZATION WITH CORONARY ANGIOGRAM;  Surgeon: [name], MD;  Location: [facility] CATH LAB;  Service: Cardiovascular;  Laterality: N/A;  . LUMBAR DISC SURGERY  2013  . PERCUTANEOUS CORONARY STENT INTERVENTION (PCI-S)  02/17/2014   Procedure: PERCUTANEOUS CORONARY STENT INTERVENTION (PCI-S);  Surgeon: [name], MD;  Location: [facility] CATH LAB;  Service: Cardiovascular;;  . TRACHEOSTOMY  05/2004  . VENTRICULOSTOMY  2006    FAMILY HISTORY: Family History  Problem Relation Age of Onset  . Heart Problems Father   . Hypertension Mother   . Stroke Unknown     SOCIAL HISTORY:  Social History   Social History  . Marital status: Single    Spouse name: N/A  . Number of children: N/A  . Years of education: N/A   Occupational History  . umemployed      Works in [employer]    Social History Main Topics  . Smoking status: Never Smoker  . Smokeless tobacco: Never Used  . Alcohol use 3.6 oz/week    6 Cans of beer per week     Comment: OCCASSIONALLY   . Drug use: No  . Sexual activity: Yes   Other Topics Concern  . Not on file   Social History Narrative   7 cups of caffeine a week      PHYSICAL EXAM     Vitals:   08/29/16 1423  BP: (!) 138/91  Pulse: (!) 109  Weight: 187 lb (84.8 kg)  Height: 5\' 7"  (1.702 m)    Not recorded      Body mass index is 29.29 kg/m.  PHYSICAL EXAMNIATION:  Gen: NAD, conversant, well nourised, obese, well groomed                     Cardiovascular: Regular rate rhythm, no peripheral edema, warm, nontender. Eyes: Conjunctivae clear without exudates or hemorrhage Neck: Supple, no carotid bruise. Pulmonary: Clear to auscultation bilaterally   NEUROLOGICAL EXAM:  MENTAL STATUS: Speech:    Speech is normal; fluent and spontaneous with normal comprehension.  Cognition:     Orientation to time, place and person     Normal recent and remote memory     Normal Attention span and concentration     Normal Language,  naming, repeating,spontaneous speech     Fund of knowledge   CRANIAL NERVES: CN II: Visual fields are full to confrontation. Fundoscopic exam is normal with sharp discs and no vascular changes. Pupils are round equal and briskly reactive to light. CN III, IV, VI: extraocular movement are normal. Smooth pursuit was broken up into small catch-up set, with dysmetria on eye movement. CN V: Facial sensation is intact to pinprick in all 3 divisions bilaterally. Corneal responses are intact.  CN VII: Face is symmetric with normal eye closure and smile. CN VIII: Hearing is normal to rubbing fingers CN IX, X: Palate elevates symmetrically. Phonation is normal. CN XI: Head turning and shoulder shrug are intact CN XII: Tongue is midline with normal movements and no atrophy.  MOTOR: He has mild left arm pronation drift, fixation on rapid rotating movement, he has mild left knee flexion weakness, left knee extension ankle dorsiflexion weakness,   REFLEXES: Reflexes are 3 and hyperreflexia of left upper and lower extremity, left Babinski sign was present  SENSORY: Intact to light touch, pinprick, positional sensation and vibratory sensation are intact  in fingers and toes.  COORDINATION: He has significant dysmetric on left finger-to-nose, heel-to-shin,  GAIT/STANCE: He needs push up to get up from seated position, has a tendency to hyperextend his left knee, difficulty with left knee flexion, clear his left foot from the floor  Romberg is absent.   DIAGNOSTIC DATA (LABS, IMAGING, TESTING) - I reviewed patient records, labs, notes, testing and imaging myself where available.   ASSESSMENT AND PLAN  George Villa is a 51 y.o. male   multiple recurrent stroke, with residual spastic left hemiparesis, lower extremity more than upper extremity, dysmetric on left upper and lower extremity   EMG guided botulism toxin injection,   used xeomin 300 units, under electric stimulation    Left tibialis posterior 75 units Left flexor digitorum longus 75 units  Left semitendinosus 50 units Left adductor longus 50 units Left adductor magnus 50 units  He will call office if he decided to continue proceed with the injection  George Villa, M.D. Ph.D.  [facility] [address], [address] [place], [place] [address] Ph: [phone] Fax: [phone]  CC: Referring Provider

## 2016-12-05 ENCOUNTER — Encounter: Payer: Self-pay | Admitting: Neurology

## 2016-12-05 ENCOUNTER — Ambulatory Visit (INDEPENDENT_AMBULATORY_CARE_PROVIDER_SITE_OTHER): Payer: Medicaid Other | Admitting: Neurology

## 2016-12-05 VITALS — BP 138/86 | HR 98 | Ht 67.0 in | Wt 192.2 lb

## 2016-12-05 DIAGNOSIS — M545 Low back pain, unspecified: Secondary | ICD-10-CM | POA: Insufficient documentation

## 2016-12-05 DIAGNOSIS — R269 Unspecified abnormalities of gait and mobility: Secondary | ICD-10-CM

## 2016-12-05 DIAGNOSIS — G8114 Spastic hemiplegia affecting left nondominant side: Secondary | ICD-10-CM | POA: Diagnosis not present

## 2016-12-05 DIAGNOSIS — G8929 Other chronic pain: Secondary | ICD-10-CM

## 2016-12-05 DIAGNOSIS — I63331 Cerebral infarction due to thrombosis of right posterior cerebral artery: Secondary | ICD-10-CM

## 2016-12-05 NOTE — Patient Instructions (Signed)
[facility]      [place], [place] [phone]) [phone]      Dr. Aldean BakerMarioara C. Bodea, MD   [phone]) [phone]

## 2016-12-05 NOTE — Progress Notes (Signed)
PATIENT: George Villa DOB: 02-17-1966  Chief Complaint  Patient presents with  . Spastic Hemiparesis    Xeomin 100 units x 3 vials - specialty pharmacy.     HISTORICAL  George Villa is a 51 years old right-handed male, seen in refer by Dr. Pearlean Brownie for evaluation of EMG guided botulism toxin injection for, his primary care physician is Dr.  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 Stroke again in June 2016, with mildly increased gait abnormality  He can drive without much difficulty, he lives by himself, he is independent of daily activity.  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  Transthoracic echo showed normal ejection fraction.  Repeat MRI scan in June 2016 showed small left coronary radiata and right temporal white matter lacunar infarct. MRA of the brain showed no large vessel intracranial stenosis. Transthoracic echo showed normal ejection fraction with severe left ventricular hypertrophy. LDL cholesterol 1110 mg percent and hemoglobin A1c in April 2017 was 5.9.   Patient was started on aspirin and Plavix for few months, now on Plavix treatment alone  UPDATE Oct 18th 2017: Patient came for his first EMG guided botulism toxin injection, he was noted to have significant elevated  blood pressure 190/110, which continues to be elevated upon repeat testing   He reported noncompliance with his blood pressure medications, has stopped taking it.  UPDATE Nov 8th 2017: He was noted to have mild elevated blood pressure again today, repeat checking around 150/105, he has been taking his lisinopril 20 mg daily,  He was noted to have spastic left hemiparesis, have a tendency of forceful left knee extension, dragging left foot across the floor, weakness with left ankle dorsi flexion, tendency for left ankle staying plantar flexion mild limited range of motion of left ankle  UPDATE May 23 2016: He received his first electrical stimulation guided the only injection in November 2017, no significant side effect noticed, he did not notice significant benefit either.  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 atorvastatin (LIPITOR) 40 MG tablet Take 1 tablet (40 mg total) by mouth daily at 6 PM. 30 tablet 1  . clopidogrel (PLAVIX) 75 MG tablet Take 1 tablet (75 mg total) by mouth daily. 30 tablet 1  . IncobotulinumtoxinA (XEOMIN IM) Inject 300 Units into the muscle every  3 (three) months.    Marland Kitchen lisinopril (PRINIVIL,ZESTRIL) 20 MG tablet Take 20 mg by mouth daily.  0  . methocarbamol (ROBAXIN) 500 MG tablet Take 1 tablet (500 mg total) by mouth 2 (two) times daily as needed for muscle spasms. 12 tablet 0  . nitroGLYCERIN (NITROSTAT) 0.4 MG SL tablet Place 1 tablet (0.4 mg total) under  the tongue every 5 (five) minutes as needed for chest pain. 25 tablet 2  . oxyCODONE-acetaminophen (PERCOCET) 10-325 MG tablet Take 1 tablet by mouth 4 (four) times daily as needed for pain.     No current facility-administered medications for this visit.     PAST MEDICAL HISTORY: Past Medical History:  Diagnosis Date  . Acid reflux   . 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  . Chronic lower back pain   . Headache    "monthly" (02/18/2014)  . Hypertension   . 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 Stroke [facility]) 2006   "left leg weak since" (02/18/2014)    PAST SURGICAL HISTORY: Past Surgical History:  Procedure Laterality Date  . BACK SURGERY    . BRAIN HEMATOMA EVACUATION  2006  . LEFT HEART CATHETERIZATION WITH CORONARY ANGIOGRAM N/A 02/17/2014   Procedure: LEFT HEART CATHETERIZATION WITH CORONARY ANGIOGRAM;  Surgeon: [name], MD;  Location: [facility] CATH LAB;  Service: Cardiovascular;  Laterality: N/A;  . LUMBAR DISC SURGERY  2013  . PERCUTANEOUS CORONARY STENT INTERVENTION (PCI-S)  02/17/2014   Procedure: PERCUTANEOUS CORONARY STENT INTERVENTION (PCI-S);  Surgeon: [name], MD;  Location: [facility] CATH LAB;  Service: Cardiovascular;;  . TRACHEOSTOMY  05/2004  . VENTRICULOSTOMY  2006    FAMILY HISTORY: Family History  Problem Relation Age of Onset  . Heart Problems Father   . Hypertension Mother   . Stroke Unknown     SOCIAL HISTORY:  Social History   Social History  . Marital status: Single    Spouse name: N/A  . Number of children: N/A  . Years of education: N/A   Occupational History  . umemployed      Works in [employer]    Social History Main Topics  . Smoking status: Never Smoker  . Smokeless tobacco: Never Used  . Alcohol use 3.6 oz/week    6 Cans of beer per week     Comment: OCCASSIONALLY   . Drug use: No  . Sexual activity: Yes   Other Topics Concern  .  Not on file   Social History Narrative   7 cups of caffeine a week      PHYSICAL EXAM   Vitals:   12/05/16 1507  BP: 138/86  Pulse: 98  Weight: 192 lb 4 oz (87.2 kg)  Height: 5\' 7"  (1.702 m)    Not recorded      Body mass index is 30.11 kg/m.  PHYSICAL EXAMNIATION:  Gen: NAD, conversant, well nourised, obese, well groomed                     Cardiovascular: Regular rate rhythm, no peripheral edema, warm, nontender. Eyes: Conjunctivae clear without exudates or hemorrhage Neck: Supple, no carotid bruise. Pulmonary: Clear to auscultation bilaterally   NEUROLOGICAL EXAM:  MENTAL STATUS: Speech:    Speech is normal; fluent and spontaneous with normal comprehension.  Cognition:     Orientation to time, place and person     Normal recent and remote memory  Normal Attention span and concentration     Normal Language, naming, repeating,spontaneous speech     Fund of knowledge   CRANIAL NERVES: CN II: Visual fields are full to confrontation. Fundoscopic exam is normal with sharp discs and no vascular changes. Pupils are round equal and briskly reactive to light. CN III, IV, VI: extraocular movement are normal. Smooth pursuit was broken up into small catch-up set, with dysmetria on eye movement. CN V: Facial sensation is intact to pinprick in all 3 divisions bilaterally. Corneal responses are intact.  CN VII: Face is symmetric with normal eye closure and smile. CN VIII: Hearing is normal to rubbing fingers CN IX, X: Palate elevates symmetrically. Phonation is normal. CN XI: Head turning and shoulder shrug are intact CN XII: Tongue is midline with normal movements and no atrophy.  MOTOR: He has mild left arm pronation drift, fixation on rapid rotating movement, he has mild left knee flexion weakness, left knee extension ankle dorsiflexion weakness,   REFLEXES: Reflexes are 3 and hyperreflexia of left upper and lower extremity, left Babinski sign was  present  SENSORY: Intact to light touch, pinprick, positional sensation and vibratory sensation are intact in fingers and toes.  COORDINATION: He has significant dysmetric on left finger-to-nose, heel-to-shin,  GAIT/STANCE: He needs push up to get up from seated position, has a tendency to hyperextend his left knee, difficulty with left knee flexion, clear his left foot from the floor  Romberg is absent.   DIAGNOSTIC DATA (LABS, IMAGING, TESTING) - I reviewed patient records, labs, notes, testing and imaging myself where available.   ASSESSMENT AND PLAN  Pricilla Larssonnthony G Goar is a 51 y.o. male   multiple recurrent stroke, with residual spastic left hemiparesis, lower extremity more than upper extremity, dysmetric on left upper and lower extremity   EMG guided botulism toxin injection,   used xeomin 300 units+200 units of xeomin sample under electric stimulation    Left tibialis posterior 100 units Left flexor digitorum longus 100 units  Left semimembranous 50  Left adductor longus 200 units Left adductor magnus  50 units  He will call office if he decided to continue proceed with the injection  Levert FeinsteinYijun Rony Ratz, M.D. Ph.D.  [facility] [address], [address] [place], [place] [address] Ph: [phone] Fax: [phone]  CC: Referring Provider

## 2016-12-05 NOTE — Progress Notes (Signed)
**  Xeomin 100 units x 3 vials, NDC 0259-1610-01, Lot [id number], Exp 12/2018, specialty pharmacy.//mck,rn** **Xeomin 100 units x 1 vial, NDC 0454-0981-190259-1610-01, Lot [id number], Exp 12/2018, sample.//mck,rn** **Xeomin 50 units x 2 vials, NDC 5621-3086-570259-4150-01, Lot [id number], Exp 10/2017, samples.//mck,rn**

## 2016-12-06 ENCOUNTER — Telehealth: Payer: Self-pay | Admitting: Neurology

## 2016-12-31 ENCOUNTER — Telehealth: Payer: Self-pay | Admitting: Neurology

## 2016-12-31 ENCOUNTER — Encounter: Payer: Self-pay | Admitting: Physical Medicine & Rehabilitation

## 2016-12-31 NOTE — Telephone Encounter (Signed)
Dr. Terrace Arabia does not manage this medication for the patient.  I returned his call and left message (ok per DPR) letting him know to contact the physician who has prescribed it for him previously.

## 2016-12-31 NOTE — Telephone Encounter (Signed)
Patient requesting refill of oxyCODONE-acetaminophen (PERCOCET) 10-325 MG tablet. ° ° °

## 2017-02-05 ENCOUNTER — Encounter: Payer: Self-pay | Admitting: Physical Medicine & Rehabilitation

## 2017-02-05 ENCOUNTER — Encounter (INDEPENDENT_AMBULATORY_CARE_PROVIDER_SITE_OTHER): Payer: Self-pay

## 2017-02-05 ENCOUNTER — Encounter: Payer: Medicaid Other | Attending: Physical Medicine & Rehabilitation | Admitting: Physical Medicine & Rehabilitation

## 2017-02-05 VITALS — BP 145/97 | HR 73

## 2017-02-05 DIAGNOSIS — I69354 Hemiplegia and hemiparesis following cerebral infarction affecting left non-dominant side: Secondary | ICD-10-CM | POA: Diagnosis not present

## 2017-02-05 DIAGNOSIS — K219 Gastro-esophageal reflux disease without esophagitis: Secondary | ICD-10-CM | POA: Diagnosis not present

## 2017-02-05 DIAGNOSIS — I69393 Ataxia following cerebral infarction: Secondary | ICD-10-CM | POA: Insufficient documentation

## 2017-02-05 DIAGNOSIS — M47816 Spondylosis without myelopathy or radiculopathy, lumbar region: Secondary | ICD-10-CM

## 2017-02-05 DIAGNOSIS — R51 Headache: Secondary | ICD-10-CM | POA: Diagnosis not present

## 2017-02-05 DIAGNOSIS — G8929 Other chronic pain: Secondary | ICD-10-CM | POA: Insufficient documentation

## 2017-02-05 DIAGNOSIS — M545 Low back pain: Secondary | ICD-10-CM | POA: Insufficient documentation

## 2017-02-05 DIAGNOSIS — I69314 Frontal lobe and executive function deficit following cerebral infarction: Secondary | ICD-10-CM | POA: Insufficient documentation

## 2017-02-05 DIAGNOSIS — I10 Essential (primary) hypertension: Secondary | ICD-10-CM | POA: Diagnosis not present

## 2017-02-05 DIAGNOSIS — G8114 Spastic hemiplegia affecting left nondominant side: Secondary | ICD-10-CM | POA: Diagnosis not present

## 2017-02-05 MED ORDER — BACLOFEN 10 MG PO TABS: 10.0000 mg | ORAL_TABLET | Freq: Three times a day (TID) | ORAL | 3 refills | Status: DC

## 2017-02-05 NOTE — Addendum Note (Signed)
Addended by: Barbee ShropshireBRIGHT, Gracianna Vink B on: 02/05/2017 11:03 AM   Modules accepted: Orders

## 2017-02-05 NOTE — Patient Instructions (Signed)
PLEASE FEEL FREE TO CALL OUR OFFICE WITH ANY PROBLEMS OR QUESTIONS ([phone])      

## 2017-02-05 NOTE — Progress Notes (Signed)
Subjective:    Patient ID: George Villa, male    DOB: June 10, 1965, 51 y.o.   MRN: [id number]  HPI   This is an "initial" visit for [name] who was a previous patient of ours. He has had numerous strokes dating back to 2006. We had him on rehab after a cerebellar stroke/hemorrhage, and I treated him aftewards as an outpt. He had two more strokes in 2015 and 2016. He has been followed by Dr Terrace ArabiaYan and Pearlean BrownieSethi. Dr Terrace ArabiaYan recently performed on botox injections on his left lower extremity in August of this year  He had [facility]. 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 He has had back pain since the surgery which is usually around his belt line. The pain sometimes radiates down the back of his leg. Sitting makes the pain worse. He can walk or stand without too much of an increase in his pain. [facility] [place] [facility] [place] [facility] but his doctor moved out of town. He hasn't had any therapy or any specific injections for his back. He has been receiving percocet 10/325 qid for pain control which seems to work for him.   I found a lumbar MRI performed in 2014 which revealed:  T12-L1: No significant foraminal or central canal stenosis. *L1-L2:No significant foraminal or central canal stenosis. *L2-L3: Facet arthrosis.No significant foraminal or central canal  stenosis. *L3-L4: Facet arthrosis. No significant foraminal or central canal  stenosis. *L4-L5: Facet arthrosis. Mild disc bulge.No significant foraminal or  central canal stenosis. *L5-S1: No significant foraminal or central canal stenosis.      Pain Inventory Average Pain 9 Pain Right Now 9 My pain is aching  In the last 24 hours, has pain interfered with the following? General activity 7 Relation with others 7 Enjoyment of life 7 What TIME of day is your pain at  its worst? night Sleep (in general) .  Pain is worse with: walking Pain improves with: medication Relief from Meds: .  Mobility use a cane do you drive?  yes  Function not employed: date last employed .  Neuro/Psych trouble walking  Prior Studies Any changes since last visit?  no  Physicians involved in your care Any changes since last visit?  no   Family History  Problem Relation Age of Onset  . Heart Problems Father   . Hypertension Mother   . Stroke Unknown    Social History   Social History  . Marital status: Single    Spouse name: N/A  . Number of children: N/A  . Years of education: N/A   Occupational History  . umemployed      Works in [employer]    Social History Main Topics  . Smoking status: Never Smoker  . Smokeless tobacco: Never Used  . Alcohol use 3.6 oz/week    6 Cans of beer per week     Comment: OCCASSIONALLY   . Drug use: No  . Sexual activity: Yes   Other Topics Concern  . None   Social History Narrative   7 cups of caffeine a week    Past Surgical History:  Procedure Laterality Date  . BACK SURGERY    . BRAIN HEMATOMA EVACUATION  2006  . LEFT HEART CATHETERIZATION WITH CORONARY ANGIOGRAM N/A 02/17/2014   Procedure: LEFT HEART CATHETERIZATION WITH CORONARY ANGIOGRAM;  Surgeon: Lesleigh NoeHenry W Tschida III, MD;  Location: [facility] CATH LAB;  Service: Cardiovascular;  Laterality: N/A;  . LUMBAR DISC SURGERY  2013  . PERCUTANEOUS CORONARY STENT INTERVENTION (PCI-S)  02/17/2014   Procedure: PERCUTANEOUS CORONARY STENT INTERVENTION (PCI-S);  Surgeon: [name], MD;  Location: [facility] CATH LAB;  Service: Cardiovascular;;  . TRACHEOSTOMY  05/2004  . VENTRICULOSTOMY  2006   Past Medical History:  Diagnosis Date  . Acid reflux   . 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  . Chronic lower back pain   . Headache    "monthly" (02/18/2014)  . Hypertension   . 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 Stroke [facility]) 2006   "left leg weak since" (02/18/2014)   BP (!) 145/97   Pulse 73   SpO2 97%   Opioid Risk Score:   Fall Risk Score:  `1  Depression screen PHQ 2/9  Depression screen PHQ 2/9 09/28/2015  Decreased Interest 0  Down, Depressed, Hopeless 0  PHQ - 2 Score 0     Review of Systems  Constitutional: Negative.   HENT: Negative.   Eyes: Negative.   Respiratory: Negative.   Cardiovascular: Negative.   Gastrointestinal: Negative.   Endocrine: Negative.   Genitourinary: Negative.   Musculoskeletal: Negative.   Skin: Negative.   Allergic/Immunologic: Negative.   Neurological: Negative.   Hematological: Negative.   Psychiatric/Behavioral: Negative.   All other systems reviewed and are negative.      Objective:   Physical Exam   General: Alert and oriented x 3, No apparent distress HEENT: Head is normocephalic, atraumatic, PERRLA, EOMI, sclera anicteric, oral mucosa pink and moist, dentition intact, ext ear canals clear,  Neck: Supple without JVD or lymphadenopathy Heart: Reg rate and rhythm. No murmurs rubs or gallops Chest: CTA bilaterally without wheezes, rales, or rhonchi; no distress Abdomen: Soft, non-tender, non-distended, bowel sounds positive. Extremities: No clubbing, cyanosis, or edema. Pulses are 2+ Skin: Clean and intact without signs of breakdown Neuro: Pt is cognitively appropriate with normal insight, memory, and awareness. Cranial nerves 2-12 are intact. Sensory exam is normal. Reflexes are 2+ in all 4's. Fine motor coordination is intact. No tremors. Motor function is grossly 5/5 RUE and RLE. LUE 4/5. LLE 3+ go 4-/5 prox to distal. RUe and RLE ataxic somewhat. During ambulation he favors left leg which tends to buckle slightly at knee. Trace tone at left hamstrings, and plantarflexors of left foot---all easily reducible to neutral.  Musculoskeletal: low back with mild TTP. SLR and SST equivocal. Had difficulty  with ROM testing due to loss of balance Psych: Pt's affect is flat but cooperative        Assessment & Plan:  1. History of multiple strokes with gait disorder, ongoing ataxia and left hemiparesis 2. Chronic low back pain with documented facet arthropathy and history of intradural defect/schwanomma requiring resection. I suspect a lot of his ongoing back pain, however, is mechanical, related to #1  Plan: 1. Trial of baclofen 10mg  TID to addres LBP and left sided tone 2. Refer to outpt PT to work on Animator and HEP for low back.  3. Drug swab today. CSA signed.  No narcotics were rxed. If sample is consistent will consider assuming rx of his percocet. I did tell him that we would look at ALL options for management of his pain including treating his stroke related symptoms. May be an injection candidate too but would likely need new imaging.  Follow up in about one month. Thirty minutes of face to face patient care time were spent during this visit. All questions were encouraged and answered.

## 2017-02-10 LAB — DRUG TOX MONITOR 1 W/CONF, ORAL FLD
AMPHETAMINES: NEGATIVE ng/mL (ref <10)
BARBITURATES: NEGATIVE ng/mL (ref <10)
BENZODIAZEPINES: NEGATIVE ng/mL (ref <0.50)
Buprenorphine: NEGATIVE ng/mL (ref <0.10)
COCAINE: NEGATIVE ng/mL (ref <5.0)
Codeine: NEGATIVE ng/mL (ref <2.5)
Dihydrocodeine: NEGATIVE ng/mL (ref <2.5)
FENTANYL: NEGATIVE ng/mL (ref <0.10)
HYDROCODONE: NEGATIVE ng/mL (ref <2.5)
Heroin Metabolite: NEGATIVE ng/mL (ref <1.0)
Hydromorphone: NEGATIVE ng/mL (ref <2.5)
MARIJUANA: NEGATIVE ng/mL (ref <2.5)
MDMA: NEGATIVE ng/mL (ref <10)
MORPHINE: NEGATIVE ng/mL (ref <2.5)
Meprobamate: NEGATIVE ng/mL (ref <2.5)
Methadone: NEGATIVE ng/mL (ref <5.0)
Nicotine Metabolite: NEGATIVE ng/mL (ref <5.0)
Norhydrocodone: NEGATIVE ng/mL (ref <2.5)
Noroxycodone: 22.9 ng/mL — ABNORMAL HIGH (ref <2.5)
Opiates: POSITIVE ng/mL — AB (ref <2.5)
Oxycodone: 116.2 ng/mL — ABNORMAL HIGH (ref <2.5)
Oxymorphone: NEGATIVE ng/mL (ref <2.5)
Phencyclidine: NEGATIVE ng/mL (ref <10)
Tapentadol: NEGATIVE ng/mL (ref <5.0)
Tramadol: NEGATIVE ng/mL (ref <5.0)
ZOLPIDEM: NEGATIVE ng/mL (ref <5.0)

## 2017-02-10 LAB — DRUG TOX ALC METAB W/CON, ORAL FLD: Alcohol Metabolite: NEGATIVE ng/mL (ref <25)

## 2017-02-18 ENCOUNTER — Telehealth: Payer: Self-pay | Admitting: *Deleted

## 2017-02-18 NOTE — Telephone Encounter (Signed)
Oral swab drug screen was consistent for prescribed medications. This was new pt eval but he had been taking oxycodone and it was present in test.

## 2017-02-27 ENCOUNTER — Telehealth: Payer: Self-pay | Admitting: *Deleted

## 2017-02-27 ENCOUNTER — Other Ambulatory Visit: Payer: Self-pay

## 2017-02-27 MED ORDER — OXYCODONE-ACETAMINOPHEN 10-325 MG PO TABS: 1.0000 | ORAL_TABLET | Freq: Four times a day (QID) | ORAL | 0 refills | Status: DC | PRN

## 2017-02-27 NOTE — Telephone Encounter (Signed)
Patient called today requesting a prescription for oxycodone, according to last note:  Drug swab today. CSA signed.  No narcotics were rxed. If sample is consistent will consider assuming rx of his percocet. I did tell him that we would look at ALL options for management of his pain including treating his stroke related symptoms. May be an injection candidate too but would likely need new imaging.   Swab results from 02-18-2017 present:  Oral swab drug screen was consistent for prescribed medications. This was new pt eval but he had been taking oxycodone and it was present in test.  Please advise on next step for this patient.

## 2017-02-27 NOTE — Telephone Encounter (Signed)
rx written

## 2017-02-27 NOTE — Telephone Encounter (Signed)
Patient left a message about getting his script for oxycodone. Last clinic visit note indicated:  3. Drug swab today. CSA signed.  No narcotics were rxed. If sample is consistent will consider assuming rx of his percocet.  Please advise on refill

## 2017-02-27 NOTE — Telephone Encounter (Signed)
Patient called and notified that his prescription is ready and to try to pick it up today before 330 and to insure that he keeps his follow up appointment and to bring medication with him

## 2017-03-04 NOTE — Telephone Encounter (Signed)
Recieved prior Auth form from pharmacy, initialize prior Auth today 03-04-2017, recieved approved status today as well, called pharmacy and patient to notify of approval

## 2017-03-06 ENCOUNTER — Encounter: Payer: Self-pay | Admitting: Physical Medicine & Rehabilitation

## 2017-03-06 ENCOUNTER — Encounter: Payer: Medicaid Other | Attending: Physical Medicine & Rehabilitation | Admitting: Physical Medicine & Rehabilitation

## 2017-03-06 VITALS — BP 120/81 | HR 82

## 2017-03-06 DIAGNOSIS — G8929 Other chronic pain: Secondary | ICD-10-CM | POA: Diagnosis not present

## 2017-03-06 DIAGNOSIS — M47816 Spondylosis without myelopathy or radiculopathy, lumbar region: Secondary | ICD-10-CM

## 2017-03-06 DIAGNOSIS — G8114 Spastic hemiplegia affecting left nondominant side: Secondary | ICD-10-CM | POA: Diagnosis not present

## 2017-03-06 DIAGNOSIS — K219 Gastro-esophageal reflux disease without esophagitis: Secondary | ICD-10-CM | POA: Insufficient documentation

## 2017-03-06 DIAGNOSIS — I69314 Frontal lobe and executive function deficit following cerebral infarction: Secondary | ICD-10-CM | POA: Insufficient documentation

## 2017-03-06 DIAGNOSIS — I10 Essential (primary) hypertension: Secondary | ICD-10-CM | POA: Diagnosis not present

## 2017-03-06 DIAGNOSIS — M545 Low back pain: Secondary | ICD-10-CM | POA: Insufficient documentation

## 2017-03-06 DIAGNOSIS — I69393 Ataxia following cerebral infarction: Secondary | ICD-10-CM | POA: Insufficient documentation

## 2017-03-06 DIAGNOSIS — I69354 Hemiplegia and hemiparesis following cerebral infarction affecting left non-dominant side: Secondary | ICD-10-CM | POA: Insufficient documentation

## 2017-03-06 DIAGNOSIS — R51 Headache: Secondary | ICD-10-CM | POA: Insufficient documentation

## 2017-03-06 MED ORDER — OXYCODONE-ACETAMINOPHEN 10-325 MG PO TABS: 1.0000 | ORAL_TABLET | Freq: Four times a day (QID) | ORAL | 0 refills | Status: AC | PRN

## 2017-03-06 NOTE — Progress Notes (Signed)
Subjective:    Patient ID: George Villa, male    DOB: Feb 13, 1966, 51 y.o.   MRN: [id number]  HPI   George Villa is here in follow up of his chronic pain and gait disorder. This is his second visit today. We began his percocet on 11/21 wthich has better controlled his pain. He hasn't noticed a big difference with baclofen. He is scheduled to begin therapy [facility] at [facility]. He remains on his cane for balance.   Pain Inventory Average Pain 6 Pain Right Now 6 My pain is dull  In the last 24 hours, has pain interfered with the following? General activity 6 Relation with others 6 Enjoyment of life 6 What TIME of day is your pain at its worst? night Sleep (in general) Fair  Pain is worse with: walking Pain improves with: medication Relief from Meds: 6  Mobility use a cane do you drive?  yes  Function disabled: date disabled .  Neuro/Psych No problems in this area  Prior Studies Any changes since last visit?  no  Physicians involved in your care Any changes since last visit?  no   Family History  Problem Relation Age of Onset  . Heart Problems Father   . Hypertension Mother   . Stroke Unknown    Social History   Socioeconomic History  . Marital status: Single    Spouse name: Not on file  . Number of children: Not on file  . Years of education: Not on file  . Highest education level: Not on file  Social Needs  . Financial resource strain: Not on file  . Food insecurity - worry: Not on file  . Food insecurity - inability: Not on file  . Transportation needs - medical: Not on file  . Transportation needs - non-medical: Not on file  Occupational History  . Occupation: umemployed     Comment: Works in [employer]   Tobacco Use  . Smoking status: Never Smoker  . Smokeless tobacco: Never Used  Substance and Sexual Activity  . Alcohol use: Yes    Alcohol/week: 3.6 oz    Types: 6 Cans of beer per week    Comment: OCCASSIONALLY   . Drug use: No  .  Sexual activity: Yes  Other Topics Concern  . Not on file  Social History Narrative   7 cups of caffeine a week    Past Surgical History:  Procedure Laterality Date  . BACK SURGERY    . BRAIN HEMATOMA EVACUATION  2006  . LEFT HEART CATHETERIZATION WITH CORONARY ANGIOGRAM N/A 02/17/2014   Procedure: LEFT HEART CATHETERIZATION WITH CORONARY ANGIOGRAM;  Surgeon: Lesleigh NoeHenry W Binion III, MD;  [facility];  Service: Cardiovascular;  Laterality: N/A;  . LUMBAR DISC SURGERY  2013  . PERCUTANEOUS CORONARY STENT INTERVENTION (PCI-S)  02/17/2014   Procedure: PERCUTANEOUS CORONARY STENT INTERVENTION (PCI-S);  Surgeon: Lesleigh NoeHenry W Magel III, MD;  [facility];  Service: Cardiovascular;;  . TRACHEOSTOMY  05/2004  . VENTRICULOSTOMY  2006   Past Medical History:  Diagnosis Date  . Acid reflux   . 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  . Chronic lower back pain   . Headache    "monthly" (02/18/2014)  . Hypertension   . 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 Stroke [facility]) 2006   "left leg weak since" (02/18/2014)   There were no vitals taken for this visit.  Opioid  Risk Score:   Fall Risk Score:  `1  Depression screen PHQ 2/9  Depression screen PHQ 2/9 09/28/2015  Decreased Interest 0  Down, Depressed, Hopeless 0  PHQ - 2 Score 0     Review of Systems  Constitutional: Negative.   HENT: Negative.   Eyes: Negative.   Respiratory: Negative.   Cardiovascular: Negative.   Gastrointestinal: Negative.   Endocrine: Negative.   Genitourinary: Negative.   Musculoskeletal: Negative.   Skin: Negative.   Allergic/Immunologic: Negative.   Neurological: Negative.   Hematological: Negative.   Psychiatric/Behavioral: Negative.   All other systems reviewed and are negative.      Objective:   Physical Exam  General: Alert and oriented x 3, No apparent distress HEENT: Head is normocephalic,  atraumatic, PERRLA, EOMI, sclera anicteric, oral mucosa pink and moist, dentition intact, ext ear canals clear,  Neck: Supple without JVD or lymphadenopathy Heart: RRR without murmur. No JVD   Chest: CTA Bilaterally without wheezes or rales. Normal effort  Abdomen: Soft, non-tender, non-distended, bowel sounds positive. Extremities: No clubbing, cyanosis, or edema. Pulses are 2+ Skin: Clean and intact without signs of breakdown Neuro: Pt is cognitively appropriate with normal insight, memory, and awareness. Cranial nerves 2-12 are intact. Sensory exam is normal. Reflexes are 2+ in all 4's. Fine motor coordination is intact. No tremors. Motor function is grossly 5/5 RUE and RLE. LUE 4/5. LLE 3+ go 4-/5 prox to distal.  RUE and RLE ataxic. Left knee pain/antalgia with weight bearing.  Trace tone at left hamstrings, and plantarflexors of left foot---tr to 1/4 Musculoskeletal: low back with mild to moderate TTP. SLR and SST equivocal. Lumbar ROM difficult due to balance.  Psych: Pt's affect is flat but cooperative        Assessment & Plan:  1. History of multiple strokes with gait disorder, ongoing ataxia and spastic left hemiparesis 2. Chronic low back pain with documented facet arthropathy and history of intradural defect/schwanomma requiring resection. Large mechanical component to pain  Plan:  1. Continue baclofen 10mg  TID to addres LBP and left sided tone 2. Pending visit to neuro-  outpt PT to work on Animatorgait mechanics and HEP for low back.  3. Percocet10/325 one q6 prn #120. Refilled today for later this month.  We will continue the opioid monitoring program, this consists of regular clinic visits, examinations, routine drug screening, pill counts as well as use of [place]. [place].    Follow up in about 7 weeks. 15 minutes of face to face patient care time were spent during this visit. All questions were encouraged and  answered. Greater than 50% of time during this encounter was spent counseling patient/family in regard to gait mechanics, posture, etc.

## 2017-03-06 NOTE — Patient Instructions (Signed)
PLEASE FEEL FREE TO CALL OUR OFFICE WITH ANY PROBLEMS OR QUESTIONS ([phone])      

## 2017-03-07 ENCOUNTER — Ambulatory Visit: Payer: Medicaid Other | Attending: Physical Medicine & Rehabilitation | Admitting: Physical Therapy

## 2017-03-07 DIAGNOSIS — M6281 Muscle weakness (generalized): Secondary | ICD-10-CM | POA: Insufficient documentation

## 2017-03-07 DIAGNOSIS — R2689 Other abnormalities of gait and mobility: Secondary | ICD-10-CM | POA: Diagnosis not present

## 2017-03-07 DIAGNOSIS — R2681 Unsteadiness on feet: Secondary | ICD-10-CM | POA: Diagnosis present

## 2017-03-08 NOTE — Therapy (Signed)
[facility] [address] [place], [place], [address] Phone: [phone]   Fax:  [phone]  Physical Therapy Evaluation  Patient Details  Name: George Villa MRN: [id number] Date of Birth: [birth year] Referring Provider: Faith RogueSwartz, Zachary   Encounter Date: 03/07/2017  PT End of Session - 03/08/17 1559    Visit Number  1    Number of Visits  12    Date for PT Re-Evaluation  05/06/17    Authorization Type  Medicaid-awaiting authorization    PT Start Time  1319    PT Stop Time  1357    PT Time Calculation (min)  38 min    Activity Tolerance  Patient tolerated treatment well    Behavior During Therapy  [facility] for tasks assessed/performed       Past Medical History:  Diagnosis Date  . Acid reflux   . 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  . Chronic lower back pain   . Headache    "monthly" (02/18/2014)  . Hypertension   . 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 Stroke [facility](HCC) 2006   "left leg weak since" (02/18/2014)    Past Surgical History:  Procedure Laterality Date  . BACK SURGERY    . BRAIN HEMATOMA EVACUATION  2006  . LEFT HEART CATHETERIZATION WITH CORONARY ANGIOGRAM N/A 02/17/2014   Procedure: LEFT HEART CATHETERIZATION WITH CORONARY ANGIOGRAM;  Surgeon: Lesleigh NoeHenry W Brittingham III, MD;  [facility];  Service: Cardiovascular;  Laterality: N/A;  . LUMBAR DISC SURGERY  2013  . PERCUTANEOUS CORONARY STENT INTERVENTION (PCI-S)  02/17/2014   Procedure: PERCUTANEOUS CORONARY STENT INTERVENTION (PCI-S);  Surgeon: Lesleigh NoeHenry W Mothershead III, MD;  [facility];  Service: Cardiovascular;;  . TRACHEOSTOMY  05/2004  . VENTRICULOSTOMY  2006    There were no vitals filed for this visit.   Subjective Assessment - 03/07/17 1321    Subjective  Pt reports having "limp in my left leg".  Due to a CVA about 10 years ago.  Tired of walking  with a cane and with a limp.  Feels that balance has worsened a little bit.  No reported falls.    Pertinent History  CVA 10 years ago, back surgery 5 years ago; HTN (controlled)    Patient Stated Goals  Pt's goals for therapy are to help me to lose my limp and catch my balance.    Currently in Pain?  No/denies         [facility] - 03/07/17 1324      Assessment   Medical Diagnosis  CVA spastic hemiparesis LLE    Referring Provider  Faith RogueSwartz, Zachary    Onset Date/Surgical Date  02/05/17 MD visit      Precautions   Precautions  Fall      Balance Screen   Has the patient fallen in the past 6 months  No    Has the patient had a decrease in activity level because of a fear of falling?   No    Is the patient reluctant to leave their home because of a fear of falling?   No      Home Environment   Living Environment  Private residence    Living Arrangements  Alone    Available Help at Discharge  Family Sister    Type of Home  Mobile home    Home Access  Stairs to enter  Entrance Stairs-Number of Steps  3    Entrance Stairs-Rails  Right;Left;Can reach both    Home Layout  One level    Home Equipment  [place] - single point      Prior Function   Level of Independence  Independent    Vocation  Part time employment works fixing jewelry some weekends    Leisure  Enjoys swimming and going hunting; mowing the lawn (but does not currently do)      Observation/Other Assessments   Focus on Therapeutic Outcomes (FOTO)   NA      Sensation   Light Touch  Appears Intact      ROM / Strength   AROM / PROM / Strength  AROM;Strength      AROM   Overall AROM Comments  neutral active L ankle dorsiflexion; P/ROM 6 degrees      Strength   Overall Strength  Deficits    Strength Assessment Site  Hip;Knee;Ankle    Right/Left Hip  Right;Left    Right Hip Flexion  5/5    Left Hip Flexion  3+/5    Left Hip ABduction  3+/5    Right/Left Knee  Right;Left    Right Knee Flexion  5/5     Right Knee Extension  5/5    Left Knee Flexion  3+/5    Left Knee Extension  3+/5    Right/Left Ankle  Right;Left    Right Ankle Dorsiflexion  5/5    Left Ankle Dorsiflexion  3+/5      Transfers   Transfers  Sit to Stand;Stand to Sit    Sit to Stand  6: Modified independent (Device/Increase time);With armrests;With upper extremity assist;From chair/3-in-1    Stand to Sit  6: Modified independent (Device/Increase time);With upper extremity assist;To chair/3-in-1;With armrests    Comments  Pt has LLE propped forward and demo decreased weightbearing through LLE with transfers.  With cueing, pt able to place feet in shoulder width position for more equal weightbearing through LLE.      Ambulation/Gait   Ambulation/Gait  Yes    Ambulation/Gait Assistance  5: Supervision    Ambulation Distance (Feet)  80 Feet    Assistive device  Straight cane    Gait Pattern  Step-to pattern;Step-through pattern;Decreased arm swing - left;Decreased step length - left;Decreased stance time - left;Decreased hip/knee flexion - left;Decreased dorsiflexion - left;Decreased weight shift to left;Decreased trunk rotation;Wide base of support;Poor foot clearance - left    Ambulation Surface  Level;Indoor    Gait velocity  15.66 sec = 2.09 ft/sec      Standardized Balance Assessment   Standardized Balance Assessment  Timed Up and Go Test;Berg Balance Test      Berg Balance Test   Sit to Stand  Able to stand  independently using hands    Standing Unsupported  Able to stand safely 2 minutes    Sitting with Back Unsupported but Feet Supported on Floor or Stool  Able to sit safely and securely 2 minutes    Stand to Sit  Controls descent by using hands    Transfers  Able to transfer safely, minor use of hands    Standing Unsupported with Eyes Closed  Able to stand 10 seconds with supervision    Standing Ubsupported with Feet Together  Needs help to attain position but able to stand for 30 seconds with feet together     From Standing, Reach Forward with Outstretched Arm  Can reach confidently >25 cm (10")  From Standing Position, Pick up Object from Floor  Able to pick up shoe, needs supervision    From Standing Position, Turn to Look Behind Over each Shoulder  Looks behind one side only/other side shows less weight shift    Turn 360 Degrees  Able to turn 360 degrees safely but slowly    Standing Unsupported, Alternately Place Feet on Step/Stool  Needs assistance to keep from falling or unable to try    Standing Unsupported, One Foot in Front  Needs help to step but can hold 15 seconds    Standing on One Leg  Unable to try or needs assist to prevent fall    Total Score  35    Berg comment:  Scores <45/56 indicate increased fall risk      Timed Up and Go Test   Normal TUG (seconds)  16.91    TUG Comments  Scores >13.5 seconds indicate increased fall risk.             Objective measurements completed on examination: See above findings.                PT Short Term Goals - 03/08/17 1615      PT SHORT TERM GOAL #1   Title  Pt will be independent with HEP for improved strength, flexibility and balance.  TARGET 04/05/17    Baseline  No current HEP    Time  3    Period  Weeks    Status  New    Target Date  04/05/17      PT SHORT TERM GOAL #2   Title  Pt will perform at least 6 of 10 reps of sit<>stand transfers with equal weightbearing through bilateral lower extremities.    Baseline  sit<>stand with decreased LLE weightbearing    Time  3    Period  Weeks    Status  New    Target Date  04/05/17      PT SHORT TERM GOAL #3   Title  Pt will improve TUG score to less than or equal to 15 seconds for decreased fall risk.    Baseline  TUG 16.91 sec (>13.5 sec indicates increased fall risk)    Time  3    Period  Weeks    Status  New    Target Date  04/05/17        PT Long Term Goals - 03/08/17 1617      PT LONG TERM GOAL #1   Title  Pt will verbalize understanding of fall  prevention in the home environment.  TARGET 05/06/17    Baseline  At fall risk per Sharlene Motts and TUG scores    Time  7    Period  Weeks    Status  New    Target Date  05/06/17      PT LONG TERM GOAL #2   Title  Pt will improve Berg Score to at least 42/56 for decreased fall risk    Baseline  Berg score 35/56 (Scores <45/56 indicate increased fall risk)    Time  7    Period  Weeks    Status  New    Target Date  05/06/17      PT LONG TERM GOAL #3   Title  Pt will improve gait velocity to at least 2.62 ft/sec for improved gait efficiency and safety.    Baseline  gait velocity 2.09 t/sec (classified as limited community ambulator)    Time  7  Period  Weeks    Status  New    Target Date  05/06/17      PT LONG TERM GOAL #4   Title  Pt will improve TUG score to less than or equal to 13.5 seconds for decreased fall risk.    Baseline  TUG score 16.91 sec    Time  7    Period  Weeks    Status  New    Target Date  05/06/17             Plan - 03/08/17 1609    Clinical Impression Statement  Pt is a 51 year old male who presents to OP PT with history of CVA approximately 10 years ago, with spastic L hemiparesis.  Pt has abnormal gait, decreased balance, decreased timing and coordination of gait, decreased strength and decreased flexibility of LLE.  Pt would benefit from skilled PT to address the above stated deficits  and to improve functional mobility.    History and Personal Factors relevant to plan of care:  PMH includes MI, CVA HTN    Clinical Presentation  Stable    Clinical Presentation due to:  Fall risk per Sharlene Motts and TUG measures    Clinical Decision Making  Low    Rehab Potential  Fair    Clinical Impairments Affecting Rehab Potential  Length of time since initial CVA    PT Frequency  Other (comment) 1x/wk for 3 weeks, then 2x/wk for 4 weeks    PT Duration  Other (comment) see above    PT Treatment/Interventions  ADLs/Self Care Home Management;Gait training;Stair  training;Functional mobility training;Therapeutic activities;Therapeutic exercise;Balance training;Patient/family education;Orthotic Fit/Training;Neuromuscular re-education    PT Next Visit Plan  Initiate HEP including stretching, strengthening, SLS and tandem stance for balance; gait training    Consulted and Agree with Plan of Care  Patient       Patient will benefit from skilled therapeutic intervention in order to improve the following deficits and impairments:  Abnormal gait, Decreased balance, Decreased mobility, Decreased safety awareness, Difficulty walking, Decreased strength, Impaired flexibility  Visit Diagnosis: Other abnormalities of gait and mobility  Unsteadiness on feet  Muscle weakness (generalized)     Problem List Patient Active Problem List   Diagnosis Date Noted  . Spondylosis without myelopathy or radiculopathy, lumbar region 02/05/2017  . Chronic low back pain 12/05/2016  . Abnormality of gait 12/26/2015  . Spastic hemiparesis of left nondominant side (HCC) 12/26/2015  . Chronic back pain 09/28/2015  . History of stroke   . Hemianopia, homonymous, right   . Stroke (HCC) 09/27/2015  . Acute CVA (cerebrovascular accident) (HCC) 09/27/2015  . Hyperlipidemia 07/16/2014  . Iron deficiency anemia 07/16/2014  . Prolonged Q-T interval on ECG 07/16/2014  . CVA (cerebral infarction) 07/16/2014  . Acute ischemic left PCA stroke (HCC) 07/15/2014  . CAD S/P PCI of dRCA - 2 DES (2.25 x 28 Promus Premier & 2.75 x 28 Promus Premier -- postdilated to 2.75 mm) - distal RCA branches small & diffusely diseased. 02/18/2014    Class: Status post  . History of ST elevation myocardial infarction (STEMI) 02/17/2014  . Essential hypertension 02/17/2014  . History of cerebellar hemorrhage 2006 02/17/2014  . GERD (gastroesophageal reflux disease) 02/17/2014  . Hyperglycemia 02/17/2014  . Obesity 02/17/2014  . ST elevation myocardial infarction (STEMI) involving right coronary  artery in recovery phase (HCC) 02/17/2014    Lorri Fukuhara W. 03/08/2017, 4:25 PM  Gean Maidens., PT   [facility] [address] [place], [place], [address] Phone: [phone]   Fax:  [phone]  Name: George Villa MRN: [id number] Date of Birth: [birth year]

## 2017-03-19 ENCOUNTER — Ambulatory Visit: Payer: Medicaid Other | Admitting: Physical Therapy

## 2017-03-26 ENCOUNTER — Telehealth: Payer: Self-pay

## 2017-03-26 ENCOUNTER — Ambulatory Visit: Payer: Medicaid Other | Attending: Physical Medicine & Rehabilitation | Admitting: Physical Therapy

## 2017-03-26 ENCOUNTER — Encounter: Payer: Self-pay | Admitting: Physical Therapy

## 2017-03-26 DIAGNOSIS — R2681 Unsteadiness on feet: Secondary | ICD-10-CM | POA: Insufficient documentation

## 2017-03-26 DIAGNOSIS — R2689 Other abnormalities of gait and mobility: Secondary | ICD-10-CM | POA: Diagnosis present

## 2017-03-26 DIAGNOSIS — M6281 Muscle weakness (generalized): Secondary | ICD-10-CM | POA: Diagnosis present

## 2017-03-26 NOTE — Telephone Encounter (Signed)
Pharmacy called for this patient today, wanting to know if it possible for him to receive an early refill due to the christmas holiday, spoke with Riley LamEunice, she has agreed to allow him to have a 1 day early refill due to the pharmacy being closed on christmas day.

## 2017-03-26 NOTE — Patient Instructions (Addendum)
(  Home) Squat: (Assist)    Using supports (standing at the counter or a chair), holding for support, squat by dropping hips back as if sitting in a chair. Repeat _10___ times per set. Do __1-2__ sets per session. Do __1-2__ sessions per day.  Copyright  VHI. All rights reserved.  Functional Quadriceps: Sit to Stand    Sit on edge of chair, feet flat on floor and even with each other (DO NOT let the right leg sneak too far back to do too much work). Stand upright, extending knees fully. Repeat __10__ times per set. Do _1-2___ sets per session. Do _1-2___ sessions per day.  http://orth.exer.us/734   Copyright  VHI. All rights reserved.  KNEE: Flexion / Extension - Sitting    Sit at edge of surface, foot on water bottle or pool noodle. Bend and straighten left knee. _10__ reps per set, __3_ sets per day, __5_ days per week   Copyright  VHI. All rights reserved.  Bridging    Slowly raise buttocks from floor, keeping stomach tight. Repeat __10__ times per set. Do __2_ sets per session. Do __1-2__ sessions per day.  http://orth.exer.us/1096   Copyright  VHI. All rights reserved.  Hip Flexion - Supine    Lying on back, knees bent, feet on floor, bend hips, bringing knees toward trunk., as in marching.  Then slowly lower down.  Repeat _10__ times, 2 sets. Do _1-_2_ times per day.  Copyright  VHI. All rights reserved.

## 2017-03-27 NOTE — Therapy (Signed)
[facility] [address] [place], [place], [address] Phone: [phone]   Fax:  [phone]  Physical Therapy Treatment  Patient Details  Name: Pricilla Larssonnthony G Osmundson MRN: [id number] Date of Birth: [birth year] Referring Provider: Faith RogueSwartz, Zachary   Encounter Date: 03/26/2017  PT End of Session - 03/27/17 1130    Visit Number  2    Number of Visits  13    Date for PT Re-Evaluation  06/03/17 per recert 03/26/17    Authorization Type  Medicaid-3 visits authorized through 03/31/17; awaiting 2019 authorization    PT Start Time  1410    PT Stop Time  1450    PT Time Calculation (min)  40 min    Activity Tolerance  Patient tolerated treatment well    Behavior During Therapy  [facility] for tasks assessed/performed       Past Medical History:  Diagnosis Date  . Acid reflux   . 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  . Chronic lower back pain   . Headache    "monthly" (02/18/2014)  . Hypertension   . 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 Stroke [facility](HCC) 2006   "left leg weak since" (02/18/2014)    Past Surgical History:  Procedure Laterality Date  . BACK SURGERY    . BRAIN HEMATOMA EVACUATION  2006  . LEFT HEART CATHETERIZATION WITH CORONARY ANGIOGRAM N/A 02/17/2014   Procedure: LEFT HEART CATHETERIZATION WITH CORONARY ANGIOGRAM;  Surgeon: Lesleigh NoeHenry W Bolla III, MD;  Location[facility];  Service: Cardiovascular;  Laterality: N/A;  . LUMBAR DISC SURGERY  2013  . PERCUTANEOUS CORONARY STENT INTERVENTION (PCI-S)  02/17/2014   Procedure: PERCUTANEOUS CORONARY STENT INTERVENTION (PCI-S);  Surgeon: Lesleigh NoeHenry W Walck III, MD;  [facility];  Service: Cardiovascular;;  . TRACHEOSTOMY  05/2004  . VENTRICULOSTOMY  2006    There were no vitals filed for this visit.  Subjective Assessment - 03/26/17 1412    Subjective  No changes, no falls since eval.       Pertinent History  CVA 10 years ago, back surgery 5 years ago; HTN (controlled)    Patient Stated Goals  Pt's goals for therapy are to help me to lose my limp and catch my balance.    Currently in Pain?  No/denies                      [facility] Treatment/Exercise - 03/26/17 1420      Transfers   Transfers  Sit to Stand;Stand to Sit    Sit to Stand  6: Modified independent (Device/Increase time);From bed    Stand to Sit  6: Modified independent (Device/Increase time);Without upper extremity assist;To bed    Number of Reps  Other reps (comment) 5 reps    Comments  Cues for positioning of LLE even with or posterior to RLE to improve LLE weightbearing.  Pt needs cues for forward excursion with sit<>stand      Ambulation/Gait   Ambulation/Gait  Yes    Ambulation/Gait Assistance  5: Supervision    Ambulation/Gait Assistance Details  PT provides verbal cues for slowed pace of gait, for increased stance time LLE     Ambulation Distance (Feet)  100 Feet 60 ft x 2, then 80 ft    Assistive device  Straight cane    Gait Pattern  Step-to pattern;Step-through pattern;Decreased arm swing - left;Decreased step length -  left;Decreased stance time - left;Decreased hip/knee flexion - left;Decreased dorsiflexion - left;Decreased weight shift to left;Decreased trunk rotation;Wide base of support;Poor foot clearance - left    Ambulation Surface  Level;Indoor    Gait Comments  Pt able to slow gait pattern and briefly increase LLE stance time, but he quickly reverts to his gait pattern of quick step on LLE once VCs stopped.  Discussed options for trial of brace/orthotic to address foot clearance, extensor tone, knee recurvatum in gait with LLE; pt reports he would rather not have a brace.      Exercises   Exercises  Knee/Hip      Knee/Hip Exercises: Aerobic   Nustep  Level 3, lower extremities only, x 8 minutes, cues for smooth, controlled hip/knee flexion LLE      Knee/Hip Exercises:  Standing   Functional Squat  1 set;10 reps    Other Standing Knee Exercises  Widened BOS lateral weightshifting 10 reps       Knee/Hip Exercises: Seated   Heel Slides  AROM;Strengthening;Left;2 sets;10 reps Rolling green pool noodle-knee flexion/extension      Knee/Hip Exercises: Supine   Bridges  Strengthening;Both;1 set;10 reps    Other Supine Knee/Hip Exercises  Hooklying marching 10 reps, then 5 reps with cues for slow, controlled descent of LLE             PT Education - 03/27/17 1130    Education provided  Yes    Education Details  HEP initiated-see instructions    Person(s) Educated  Patient    Methods  Explanation;Handout;Demonstration    Comprehension  Verbalized understanding;Returned demonstration;Verbal cues required       PT Short Term Goals - 03/27/17 1134      PT SHORT TERM GOAL #1   Title  Pt will be independent with HEP for improved strength, flexibility and balance.  TARGET 05/09/16    Baseline  HEP initiated at 03/26/17 visit    Time  3    Period  Weeks per recert 03/26/17    Status  On-going    Target Date  05/09/17      PT SHORT TERM GOAL #2   Title  Pt will perform at least 6 of 10 reps of sit<>stand transfers with equal weightbearing through bilateral lower extremities.    Baseline  transfer training initiated, with sit to stand and functional squats as part of HEP    Time  3    Period  Weeks    Status  On-going      PT SHORT TERM GOAL #3   Title  Pt will improve TUG score to less than or equal to 15 seconds for decreased fall risk.    Baseline  TUG 16.91 sec (>13.5 sec indicates increased fall risk); not able to be addressed due to one visit only since eval    Time  3    Period  Weeks per recert 03/26/17    Status  On-going    Target Date  05/09/17        PT Long Term Goals - 03/27/17 1138      PT LONG TERM GOAL #1   Title  Pt will verbalize understanding of fall prevention in the home environment.  UPDATED TARGET 06/06/17    Baseline   At fall risk per Sharlene MottsBerg and TUG scores    Time  7    Period  Weeks All LTGs per recert 03/26/17    Status  New    Target Date  06/06/17      PT LONG TERM GOAL #2   Title  Pt will improve Berg Score to at least 42/56 for decreased fall risk    Baseline  Berg score 35/56 (Scores <45/56 indicate increased fall risk)    Time  7    Period  Weeks    Status  New    Target Date  06/06/17      PT LONG TERM GOAL #3   Title  Pt will improve gait velocity to at least 2.62 ft/sec for improved gait efficiency and safety.    Baseline  gait velocity 2.09 t/sec (classified as limited community ambulator)    Time  7    Period  Weeks    Status  New    Target Date  06/06/17      PT LONG TERM GOAL #4   Title  Pt will improve TUG score to less than or equal to 13.5 seconds for decreased fall risk.    Baseline  TUG score 16.91 sec    Time  7    Period  Weeks    Status  New    Target Date  06/06/17            Plan - 03/27/17 1133    Clinical Impression Statement  Pt has been seen for one PT visit since eval (due to awaiting scheduling until Medicaid authorization and due to cancelled visit last week due to inclement weather/excess snow).  At today's visit, PT initiated HEP to address LLE strengthening and flexibility.  Pt return demo understanding of initial HEP and appears motivated to participate in HEP and therapy.  STG 1 and 2 have been addressed with transfer training and functional stregnthening initiated as part of HEP; all STGS are ongoing (as well as LTGs), due to pt returning only once since PT eval.  Pt will continue to benefit from skilled PT to address strengthening, balance and gait for improved functional mobility and decreased fall risk.    Rehab Potential  Fair    Clinical Impairments Affecting Rehab Potential  Length of time since initial CVA    PT Frequency  Other (comment) 1x/wk for 3 weeks, then 2x/wk for 4 weeks    PT Duration  Other (comment) see above-per recert 03/26/17     PT Treatment/Interventions  ADLs/Self Care Home Management;Gait training;Stair training;Functional mobility training;Therapeutic activities;Therapeutic exercise;Balance training;Patient/family education;Orthotic Fit/Training;Neuromuscular re-education    PT Next Visit Plan  Review HEP; progress HEP for strengthening, stretching, SLS and tandem stance; balance and gait training    Consulted and Agree with Plan of Care  Patient       Patient will benefit from skilled therapeutic intervention in order to improve the following deficits and impairments:  Abnormal gait, Decreased balance, Decreased mobility, Decreased safety awareness, Difficulty walking, Decreased strength, Impaired flexibility  Visit Diagnosis: Muscle weakness (generalized)  Other abnormalities of gait and mobility  Unsteadiness on feet     Problem List Patient Active Problem List   Diagnosis Date Noted  . Spondylosis without myelopathy or radiculopathy, lumbar region 02/05/2017  . Chronic low back pain 12/05/2016  . Abnormality of gait 12/26/2015  . Spastic hemiparesis of left nondominant side (HCC) 12/26/2015  . Chronic back pain 09/28/2015  . History of stroke   . Hemianopia, homonymous, right   . Stroke (HCC) 09/27/2015  . Acute CVA (cerebrovascular accident) (HCC) 09/27/2015  . Hyperlipidemia 07/16/2014  . Iron deficiency anemia 07/16/2014  . Prolonged Q-T interval on ECG 07/16/2014  .  CVA (cerebral infarction) 07/16/2014  . Acute ischemic left PCA stroke (HCC) 07/15/2014  . CAD S/P PCI of dRCA - 2 DES (2.25 x 28 Promus Premier & 2.75 x 28 Promus Premier -- postdilated to 2.75 mm) - distal RCA branches small & diffusely diseased. 02/18/2014    Class: Status post  . History of ST elevation myocardial infarction (STEMI) 02/17/2014  . Essential hypertension 02/17/2014  . History of cerebellar hemorrhage 2006 02/17/2014  . GERD (gastroesophageal reflux disease) 02/17/2014  . Hyperglycemia 02/17/2014  . Obesity  02/17/2014  . ST elevation myocardial infarction (STEMI) involving right coronary artery in recovery phase (HCC) 02/17/2014    MARRIOTT,AMY W. 03/27/2017, 11:40 AM  Gean Maidens., PT   [facility] [address] [place], [place], [address] Phone: [phone]   Fax:  [phone]  Name: KHALIK PEWITT MRN: [id number] Date of Birth: April 08, 1966

## 2017-04-03 ENCOUNTER — Ambulatory Visit: Payer: Medicaid Other | Admitting: Physical Therapy

## 2017-04-11 ENCOUNTER — Ambulatory Visit: Payer: Medicaid Other | Admitting: Physical Therapy

## 2017-04-12 ENCOUNTER — Ambulatory Visit: Payer: Medicaid Other | Admitting: Physical Therapy

## 2017-04-15 ENCOUNTER — Encounter: Payer: Self-pay | Admitting: Physical Therapy

## 2017-04-15 ENCOUNTER — Ambulatory Visit: Payer: Medicaid Other | Attending: Physical Medicine & Rehabilitation | Admitting: Physical Therapy

## 2017-04-15 DIAGNOSIS — R2689 Other abnormalities of gait and mobility: Secondary | ICD-10-CM | POA: Insufficient documentation

## 2017-04-15 DIAGNOSIS — M6281 Muscle weakness (generalized): Secondary | ICD-10-CM

## 2017-04-15 DIAGNOSIS — R2681 Unsteadiness on feet: Secondary | ICD-10-CM | POA: Insufficient documentation

## 2017-04-15 NOTE — Patient Instructions (Addendum)
(  Home) Squat: (Assist)    Using supports (standing at the counter or a chair), holding for support, and have a chair behind you so you will be willing to go lower. squat by dropping hips back as if sitting in a chair. Repeat _10___ times per set. Do __1-2__ sets per session. Do __1-2__ sessions per day.  Copyright  VHI. All rights reserved.    Functional Quadriceps: Sit to Stand    Sit on edge of chair, feet flat on floor (make sure your left foot is back) and even with each other (DO NOT let the right leg sneak too far back to do too much work). Place an object on table ~262ft in front of you. SMELL the object / flower getting weight over your feet first. Then stand upright, extending knees fully. Sit down by first bowing to smell the object / flower then sit at edge of chair.  Repeat __10__ times per set. Do _1-2___ sets per session. Do _1-2___ sessions per day.  http://orth.exer.us/734   Copyright  VHI. All rights reserved.  KNEE: Flexion / Extension - Sitting    Sit at edge of surface, foot on water bottle or pool noodle. Bend and straighten left knee. Also work on bending your knee bringing your foot back as far as possible.  _10__ reps per set, __3_ sets per day, __5_ days per week   Copyright  VHI. All rights reserved.  Bridging    Use sheet around left ankle to make sure your left foot is close to buttocks and place a pillow between knees to hold.  Slowly raise buttocks from floor, keeping stomach tight. Repeat __10__ times per set. Do __2_ sets per session. Do __1-2__ sessions per day.  http://orth.exer.us/1096   Copyright  VHI. All rights reserved.    Hip Flexion - Supine        Lying on back, knees bent, feet on floor (may need to tie a sheet to left ankle so bend your knee), start by Tighten stomach and flatten back by rolling pelvis down.then lift thigh & knee bending hip, bringing knee toward trunk., as in marching.  Then slowly lower down. Do a tilt  prior to lifting each leg.   Repeat _10__ times, 2 sets. Do _1-_2_ times per day.  Copyright  VHI. All rights reserved.   Copyright  VHI. All rights reserved.  HIP / KNEE: Flexion, Heel Slides - Supine    Slide heel up toward buttocks, keeping leg in straight line. Assist with sheet if needed.  _10__ reps per set, _1-2_ sets per day, _7__ days per week Use towel or pillowcase under heel as needed.  Copyright  VHI. All rights reserved.  Single Leg Ball Roll    Have chair backs on each side of you. Place right foot on tennis ball. Roll front to back 10 times. Then switch legs and roll front to back with right foot. Repeat rolling tennis ball side to side.  With right foot placed lightly on tennis ball, roll ball right. Maintain balance. Repeat __5__ times on each leg per session. Do __1-2__ sessions per day.  Copyright  VHI. All rights reserved.

## 2017-04-16 NOTE — Therapy (Signed)
[facility] [address] [place], [place], [address] Phone: [phone]   Fax:  [phone]  Physical Therapy Treatment  Patient Details  Name: George Villa MRN: [id number] Date of Birth: 12/29/1965 Referring Provider: Faith Rogue   Encounter Date: 04/15/2017  PT End of Session - 04/15/17 1958    Visit Number  3    Number of Visits  13    Date for PT Re-Evaluation  06/03/17 per recert 03/26/17    Authorization Type  Medicaid-3 visits authorized through 03/31/17; awaiting 2019 authorization    Authorization Time Period  3 visits 04/15/2017 - 05/05/2017    Authorization - Visit Number  1    Authorization - Number of Visits  3    PT Start Time  1318    PT Stop Time  1400    PT Time Calculation (min)  42 min    Activity Tolerance  Patient tolerated treatment well    Behavior During Therapy  [facility] for tasks assessed/performed       Past Medical History:  Diagnosis Date  . Acid reflux   . 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  . Chronic lower back pain   . Headache    "monthly" (02/18/2014)  . Hypertension   . 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 Stroke [facility]) 2006   "left leg weak since" (02/18/2014)    Past Surgical History:  Procedure Laterality Date  . BACK SURGERY    . BRAIN HEMATOMA EVACUATION  2006  . LEFT HEART CATHETERIZATION WITH CORONARY ANGIOGRAM N/A 02/17/2014   Procedure: LEFT HEART CATHETERIZATION WITH CORONARY ANGIOGRAM;  Surgeon: [name], MD;  Location: [facility] CATH LAB;  Service: Cardiovascular;  Laterality: N/A;  . LUMBAR DISC SURGERY  2013  . PERCUTANEOUS CORONARY STENT INTERVENTION (PCI-S)  02/17/2014   Procedure: PERCUTANEOUS CORONARY STENT INTERVENTION (PCI-S);  Surgeon: [name], MD;  Location: [facility] CATH LAB;  Service: Cardiovascular;;  . TRACHEOSTOMY  05/2004  . VENTRICULOSTOMY  2006     There were no vitals filed for this visit.  Subjective Assessment - 04/15/17 1322    Subjective  He slipped in mud exiting his home & fell. His outer thigh was bruised & sore but no lasting issues. He has been doing  his exercises.     Pertinent History  CVA 10 years ago, back surgery 5 years ago; HTN (controlled)    Patient Stated Goals  Pt's goals for therapy are to help me to lose my limp and catch my balance.    Currently in Pain?  No/denies      With this exercise PT recommended chair behind him to facilitate lower controlled motion. Pt verbalized understanding.  (Home) Squat: (Assist)    Using supports (standing at the counter or a chair), holding for support, and have a chair behind you so you will be willing to go lower. squat by dropping hips back as if sitting in a chair. Repeat _10___ times per set. Do __1-2__ sets per session. Do __1-2__ sessions per day.  Copyright  VHI. All rights reserved.     With this exercise PT demonstrated & instructed in proper weight shift for sit to stand to sit and bringing left foot back prior to standing.  Functional Quadriceps: Sit to Stand    Sit on edge of chair, feet flat on floor (make sure your left foot is back) and  even with each other (DO NOT let the right leg sneak too far back to do too much work). Place an object on table ~562ft in front of you. SMELL the object / flower getting weight over your feet first. Then stand upright, extending knees fully. Sit down by first bowing to smell the object / flower then sit at edge of chair.  Repeat __10__ times per set. Do _1-2___ sets per session. Do _1-2___ sessions per day.  http://orth.exer.us/734   Copyright  VHI. All rights reserved.   With this exercise PT recommended emphasizing both full extension & maximal flexion of knee.  KNEE: Flexion / Extension - Sitting    Sit at edge of surface, foot on water bottle or pool noodle. Bend and straighten left knee. Also work on  bending your knee bringing your foot back as far as possible.  _10__ reps per set, __3_ sets per day, __5_ days per week   Copyright  VHI. All rights reserved.    PT instructed patient in posterior pelvic tilts with tactile & verbal cues. Pt cued to make sure LLE is flexed even with RLE, placing pillow between knees & performing posterior pelvic tilt prior to lifting.  Bridging    Use sheet around left ankle to make sure your left foot is close to buttocks and place a pillow between knees to hold.  Slowly raise buttocks from floor, keeping stomach tight. Repeat __10__ times per set. Do __2_ sets per session. Do __1-2__ sessions per day.  http://orth.exer.us/1096   Copyright  VHI. All rights reserved.   Pt cued to perform posterior pelvic tilt prior to lifting single leg for marching for improved core stability.  Hip Flexion - Supine        Lying on back, knees bent, feet on floor (may need to tie a sheet to left ankle so bend your knee), start by Tighten stomach and flatten back by rolling pelvis down.then lift thigh & knee bending hip, bringing knee toward trunk., as in marching.  Then slowly lower down. Do a tilt prior to lifting each leg.   Repeat _10__ times, 2 sets. Do _1-_2_ times per day.  Copyright  VHI. All rights reserved.   Copyright  VHI. All rights reserved.    Patient used sheet tied around left ankle to assist end range of motion.  HIP / KNEE: Flexion, Heel Slides - Supine    Slide heel up toward buttocks, keeping leg in straight line. Assist with sheet if needed.  _10__ reps per set, _1-2_ sets per day, _7__ days per week Use towel or pillowcase under heel as needed.  Copyright  VHI. All rights reserved.   PT demo prior to exercise & instructed in upright posture.  Single Leg Ball Roll    Have chair backs on each side of you. Place right foot on tennis ball. Roll front to back 10 times. Then switch legs and roll front to back with right  foot. Repeat rolling tennis ball side to side.  With right foot placed lightly on tennis ball, roll ball right. Maintain balance. Repeat __5__ times on each leg per session. Do __1-2__ sessions per day.  Copyright  VHI. All rights reserved.       Patient performed hooklying hip rotation single leg alternating LEs with cues to control non-moving LE for stabilization. Plan to add to HEP next session.  PT Education - 04/15/17 1315    Education provided  Yes    Education Details  Rreviewed HEP issued last session & updated HEP    Person(s) Educated  Patient    Methods  Explanation;Demonstration;Tactile cues;Verbal cues;Handout    Comprehension  Verbalized understanding;Returned demonstration;Verbal cues required;Tactile cues required;Need further instruction       PT Short Term Goals - 03/27/17 1134      PT SHORT TERM GOAL #1   Title  Pt will be independent with HEP for improved strength, flexibility and balance.  TARGET 05/09/16    Baseline  HEP initiated at 03/26/17 visit    Time  3    Period  Weeks per recert 03/26/17    Status  On-going    Target Date  05/09/17      PT SHORT TERM GOAL #2   Title  Pt will perform at least 6 of 10 reps of sit<>stand transfers with equal weightbearing through bilateral lower extremities.    Baseline  transfer training initiated, with sit to stand and functional squats as part of HEP    Time  3    Period  Weeks    Status  On-going      PT SHORT TERM GOAL #3   Title  Pt will improve TUG score to less than or equal to 15 seconds for decreased fall risk.    Baseline  TUG 16.91 sec (>13.5 sec indicates increased fall risk); not able to be addressed due to one visit only since eval    Time  3    Period  Weeks per recert 03/26/17    Status  On-going    Target Date  05/09/17        PT Long Term Goals - 03/27/17 1138      PT LONG TERM GOAL #1   Title  Pt will verbalize understanding of fall prevention  in the home environment.  UPDATED TARGET 06/06/17    Baseline  At fall risk per Sharlene Motts and TUG scores    Time  7    Period  Weeks All LTGs per recert 03/26/17    Status  New    Target Date  06/06/17      PT LONG TERM GOAL #2   Title  Pt will improve Berg Score to at least 42/56 for decreased fall risk    Baseline  Berg score 35/56 (Scores <45/56 indicate increased fall risk)    Time  7    Period  Weeks    Status  New    Target Date  06/06/17      PT LONG TERM GOAL #3   Title  Pt will improve gait velocity to at least 2.62 ft/sec for improved gait efficiency and safety.    Baseline  gait velocity 2.09 t/sec (classified as limited community ambulator)    Time  7    Period  Weeks    Status  New    Target Date  06/06/17      PT LONG TERM GOAL #4   Title  Pt will improve TUG score to less than or equal to 13.5 seconds for decreased fall risk.    Baseline  TUG score 16.91 sec    Time  7    Period  Weeks    Status  New    Target Date  06/06/17            Plan - 04/15/17 1959    Clinical Impression Statement  patient  appears to have an understanding of updated HEP. He needed skilled instruction to improve movement pattern of LLE with exercises & activities.     Rehab Potential  Fair    Clinical Impairments Affecting Rehab Potential  Length of time since initial CVA    PT Frequency  Other (comment) 1x/wk for 3 weeks, then 2x/wk for 4 weeks    PT Duration  Other (comment) see above-per recert 03/26/17    PT Treatment/Interventions  ADLs/Self Care Home Management;Gait training;Stair training;Functional mobility training;Therapeutic activities;Therapeutic exercise;Balance training;Patient/family education;Orthotic Fit/Training;Neuromuscular re-education    PT Next Visit Plan  Review HEP; progress HEP for strengthening, stretching, SLS and tandem stance; balance and gait training    Consulted and Agree with Plan of Care  Patient       Patient will benefit from skilled therapeutic  intervention in order to improve the following deficits and impairments:  Abnormal gait, Decreased balance, Decreased mobility, Decreased safety awareness, Difficulty walking, Decreased strength, Impaired flexibility  Visit Diagnosis: Muscle weakness (generalized)  Other abnormalities of gait and mobility  Unsteadiness on feet     Problem List Patient Active Problem List   Diagnosis Date Noted  . Spondylosis without myelopathy or radiculopathy, lumbar region 02/05/2017  . Chronic low back pain 12/05/2016  . Abnormality of gait 12/26/2015  . Spastic hemiparesis of left nondominant side (HCC) 12/26/2015  . Chronic back pain 09/28/2015  . History of stroke   . Hemianopia, homonymous, right   . Stroke (HCC) 09/27/2015  . Acute CVA (cerebrovascular accident) (HCC) 09/27/2015  . Hyperlipidemia 07/16/2014  . Iron deficiency anemia 07/16/2014  . Prolonged Q-T interval on ECG 07/16/2014  . CVA (cerebral infarction) 07/16/2014  . Acute ischemic left PCA stroke (HCC) 07/15/2014  . CAD S/P PCI of dRCA - 2 DES (2.25 x 28 Promus Premier & 2.75 x 28 Promus Premier -- postdilated to 2.75 mm) - distal RCA branches small & diffusely diseased. 02/18/2014    Class: Status post  . History of ST elevation myocardial infarction (STEMI) 02/17/2014  . Essential hypertension 02/17/2014  . History of cerebellar hemorrhage 2006 02/17/2014  . GERD (gastroesophageal reflux disease) 02/17/2014  . Hyperglycemia 02/17/2014  . Obesity 02/17/2014  . ST elevation myocardial infarction (STEMI) involving right coronary artery in recovery phase (HCC) 02/17/2014    Bertrice Leder PT, DPT 04/16/2017, 10:01 AM  [facility] [address] [place], [place], [address] Phone: [phone]   Fax:  [phone]  Name: George Villa MRN: [id number] Date of Birth: Dec 22, 1965

## 2017-04-18 ENCOUNTER — Ambulatory Visit: Payer: Medicaid Other | Admitting: Physical Therapy

## 2017-04-22 ENCOUNTER — Ambulatory Visit: Payer: Medicaid Other | Admitting: Physical Therapy

## 2017-04-24 ENCOUNTER — Encounter: Payer: Self-pay | Admitting: Physical Medicine & Rehabilitation

## 2017-04-24 ENCOUNTER — Encounter: Payer: Medicaid Other | Attending: Physical Medicine & Rehabilitation | Admitting: Physical Medicine & Rehabilitation

## 2017-04-24 ENCOUNTER — Other Ambulatory Visit: Payer: Self-pay

## 2017-04-24 VITALS — BP 134/87 | HR 94

## 2017-04-24 DIAGNOSIS — M47816 Spondylosis without myelopathy or radiculopathy, lumbar region: Secondary | ICD-10-CM

## 2017-04-24 DIAGNOSIS — I69314 Frontal lobe and executive function deficit following cerebral infarction: Secondary | ICD-10-CM | POA: Diagnosis not present

## 2017-04-24 DIAGNOSIS — I63532 Cerebral infarction due to unspecified occlusion or stenosis of left posterior cerebral artery: Secondary | ICD-10-CM | POA: Diagnosis not present

## 2017-04-24 DIAGNOSIS — K219 Gastro-esophageal reflux disease without esophagitis: Secondary | ICD-10-CM | POA: Insufficient documentation

## 2017-04-24 DIAGNOSIS — R51 Headache: Secondary | ICD-10-CM | POA: Insufficient documentation

## 2017-04-24 DIAGNOSIS — I69354 Hemiplegia and hemiparesis following cerebral infarction affecting left non-dominant side: Secondary | ICD-10-CM | POA: Insufficient documentation

## 2017-04-24 DIAGNOSIS — I10 Essential (primary) hypertension: Secondary | ICD-10-CM | POA: Insufficient documentation

## 2017-04-24 DIAGNOSIS — M545 Low back pain: Secondary | ICD-10-CM | POA: Diagnosis not present

## 2017-04-24 DIAGNOSIS — I69393 Ataxia following cerebral infarction: Secondary | ICD-10-CM | POA: Insufficient documentation

## 2017-04-24 DIAGNOSIS — G8929 Other chronic pain: Secondary | ICD-10-CM | POA: Diagnosis not present

## 2017-04-24 MED ORDER — OXYCODONE-ACETAMINOPHEN 10-325 MG PO TABS: 1.0000 | ORAL_TABLET | Freq: Four times a day (QID) | ORAL | 0 refills | Status: DC | PRN

## 2017-04-24 NOTE — Progress Notes (Signed)
Subjective:    Patient ID: George Villa, male    DOB: June 21, 1965, 52 y.o.   MRN: [id number]  HPI   George Villa is here in follow up of his chronic pain.  He fell on New Year's Day when he slipped in some mud.  I asked him how many times he is fallen over this last year and he says perhaps 3 or 4 times.  Most of the sound as if they were avoidable.  He has been to outpatient PT 3 times thus far.  He "has noticed much of a difference thus far."  He feels that the Percocet is helping with his pain levels as a whole.  He is taking the 10/325 dose 4 times a day on average.  His sleep can be broken.  I asked him about his pattern and he states that he typically will fall asleep on the couch in front of the TV around 9 PM and then wake back up around midnight and then go to bed which then takes him 1-2 more hours to fall asleep.  Typically he finally wakes up around 4 AM each day.   Pain Inventory Average Pain 2 Pain Right Now 7 My pain is dull  In the last 24 hours, has pain interfered with the following? General activity 0 Relation with others 0 Enjoyment of life 0 What TIME of day is your pain at its worst? night Sleep (in general) Fair  Pain is worse with: walking, bending, sitting, inactivity, standing and some activites Pain improves with: rest and medication Relief from Meds: 8  Mobility walk with assistance use a cane ability to climb steps?  yes do you drive?  yes Do you have any goals in this area?  no  Function disabled: date disabled .  Neuro/Psych No problems in this area  Prior Studies Any changes since last visit?  no  Physicians involved in your care Any changes since last visit?  no   Family History  Problem Relation Age of Onset  . Heart Problems Father   . Hypertension Mother   . Stroke Unknown    Social History   Socioeconomic History  . Marital status: Single    Spouse name: None  . Number of children: None  . Years of education: None  .  Highest education level: None  Social Needs  . Financial resource strain: None  . Food insecurity - worry: None  . Food insecurity - inability: None  . Transportation needs - medical: None  . Transportation needs - non-medical: None  Occupational History  . Occupation: umemployed     Comment: Works in [employer]  . Smoking status: Never Smoker  . Smokeless tobacco: Never Used  Substance and Sexual Activity  . Alcohol use: Yes    Alcohol/week: 3.6 oz    Types: 6 Cans of beer per week    Comment: OCCASSIONALLY   . Drug use: No  . Sexual activity: Yes  Other Topics Concern  . None  Social History Narrative   7 cups of caffeine a week    Past Surgical History:  Procedure Laterality Date  . BACK SURGERY    . BRAIN HEMATOMA EVACUATION  2006  . LEFT HEART CATHETERIZATION WITH CORONARY ANGIOGRAM N/A 02/17/2014   Procedure: LEFT HEART CATHETERIZATION WITH CORONARY ANGIOGRAM;  Surgeon: [name], MD;  Location: [facility] CATH LAB;  Service: Cardiovascular;  Laterality: N/A;  . LUMBAR DISC SURGERY  2013  .  PERCUTANEOUS CORONARY STENT INTERVENTION (PCI-S)  02/17/2014   Procedure: PERCUTANEOUS CORONARY STENT INTERVENTION (PCI-S);  Surgeon: Lesleigh NoeHenry W Raysor III, MD;  [facility];  Service: Cardiovascular;;  . TRACHEOSTOMY  05/2004  . VENTRICULOSTOMY  2006   Past Medical History:  Diagnosis Date  . Acid reflux   . 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  . Chronic lower back pain   . Headache    "monthly" (02/18/2014)  . Hypertension   . 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 Stroke [facility](HCC) 2006   "left leg weak since" (02/18/2014)   There were no vitals taken for this visit.  Opioid Risk Score:  0 Fall Risk Score:  `1  Depression screen PHQ 2/9  [facility] 04/24/2017 09/28/2015  Decreased Interest 0 0  Down, Depressed, Hopeless 0 0  PHQ - 2 Score 0 0      Review of Systems  Constitutional: Negative.   HENT: Negative.   Eyes: Negative.   Respiratory: Negative.   Cardiovascular: Negative.   Gastrointestinal: Negative.   Endocrine: Negative.   Genitourinary: Negative.   Musculoskeletal: Negative.   Skin: Negative.   Allergic/Immunologic: Negative.   Neurological: Negative.   Hematological: Negative.   Psychiatric/Behavioral: Negative.        Objective:   Physical Exam General: Alert and oriented x 3, No apparent distress HEENT:Head is normocephalic, atraumatic, PERRLA, EOMI, sclera anicteric, oral mucosa pink and moist, dentition intact, ext ear canals clear,  Neck:Supple without JVD or lymphadenopathy Heart: Regular rate Chest:Clear Abdomen:Soft, non-tender, non-distended, bowel sounds positive. Extremities:No clubbing, cyanosis, or edema. Pulses are 2+ Skin:Clean and intact without signs of breakdown Neuro:Pt is cognitively appropriate with normal insight, memory, and awareness. Cranial nerves 2-12 are intact. Sensory exam is normal. Reflexes are 2+ in all 4's. Fine motor coordination is intact. No tremors. Motor function is grossly 5/5 RUE and RLE. LUE 4/5. LLE 3+ go 4-/5 prox to distal.  RUE and RLE ataxic. Left knee pain/antalgia with weight bearing.  Trace tone at left hamstrings, and plantarflexors of left foot---tr to 1/4.  Patient ambulated today with a slight circumducted pattern where he elevates the left hip as well.  He strikes with the whole foot instead of the heel.  With some cueing he did show improvement. Musculoskeletal:low back with mild to moderate TTP. SLR and SST equivocal.  Lumbar range of motion is limited.  Psych:Pt's affect is flat but cooperative     Assessment & Plan:  1. History of multiple strokes with gait disorder, ongoing ataxia and spastic left hemiparesis 2. Chronic low back pain with documented facet arthropathy and history of intradural defect/schwanomma requiring resection.    mechanical component to pain  Plan:  1. Continue baclofen 10mg  TID to addres LBP and left sided tone 2. Pending visit to neuro-  outpt PT to work on Animatorgait mechanics and HEP for low back.  3. Percocet10/325 one q6 prn #120. Refilled today for later this month.  We will continue the opioid monitoring program, this consists of regular clinic visits, examinations, routine drug screening, pill counts as well as use of [place]. [place].   4.  Reviewed appropriate sleep hygiene with patient  Follow up in about  1 month with me or nurse practitioner.15 minutes of face to face patient care time were spent during this visit. All questions were encouraged and answered. Spent additional  time with patient regarding gait mechanics and tips to improve.

## 2017-04-24 NOTE — Patient Instructions (Signed)
IMPROVED SLEEP HABITS, HYGIENE  KNEE BEND THEN HEEL OFF, THEN TOES UP AND SWING-------->>>STRIKE WITH YOUR HEEL.   KEEP HIPS/PELVIS BALANCED!   PLEASE FEEL FREE TO CALL OUR OFFICE WITH ANY PROBLEMS OR QUESTIONS [phone])

## 2017-04-25 ENCOUNTER — Ambulatory Visit: Payer: Medicaid Other | Admitting: Physical Therapy

## 2017-04-25 ENCOUNTER — Encounter: Payer: Self-pay | Admitting: Physical Therapy

## 2017-04-25 DIAGNOSIS — M6281 Muscle weakness (generalized): Secondary | ICD-10-CM

## 2017-04-25 DIAGNOSIS — R2689 Other abnormalities of gait and mobility: Secondary | ICD-10-CM

## 2017-04-25 DIAGNOSIS — R2681 Unsteadiness on feet: Secondary | ICD-10-CM

## 2017-04-26 NOTE — Therapy (Signed)
[facility] [address] [place], [place], [address] Phone: [phone]   Fax:  [phone]  Physical Therapy Treatment  Patient Details  Name: George Villa MRN: [id number] Date of Birth: Sep 06, 1965 Referring Provider: Alger Simons   Encounter Date: 04/25/2017  PT End of Session - 04/26/17 0832    Visit Number  4    Number of Visits  13    Date for PT Re-Evaluation  30/94/07 per recert 68/08/81    Authorization Type  Medicaid    Authorization Time Period  3 visits 04/15/2017 - 05/05/2017    Authorization - Visit Number  2    Authorization - Number of Visits  3    PT Start Time  1319    PT Stop Time  1402    PT Time Calculation (min)  43 min    Activity Tolerance  Patient tolerated treatment well    Behavior During Therapy  [facility] for tasks assessed/performed       Past Medical History:  Diagnosis Date  . Acid reflux   . 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  . Chronic lower back pain   . Headache    "monthly" (02/18/2014)  . Hypertension   . Inferior MI ([place]) 02/17/2014   Archie Endo 02/17/2014  . Stroke [facility]) 2006   "left leg weak since" (02/18/2014)    Past Surgical History:  Procedure Laterality Date  . BACK SURGERY    . BRAIN HEMATOMA EVACUATION  2006  . LEFT HEART CATHETERIZATION WITH CORONARY ANGIOGRAM N/A 02/17/2014   Procedure: LEFT HEART CATHETERIZATION WITH CORONARY ANGIOGRAM;  Surgeon: Sinclair Grooms, MD;  Location: [facility] CATH LAB;  Service: Cardiovascular;  Laterality: N/A;  . LUMBAR [place] SURGERY  2013  . PERCUTANEOUS CORONARY STENT INTERVENTION (PCI-S)  02/17/2014   Procedure: PERCUTANEOUS CORONARY STENT INTERVENTION (PCI-S);  Surgeon: Sinclair Grooms, MD;  Location: [facility] CATH LAB;  Service: Cardiovascular;;  . TRACHEOSTOMY  05/2004  . VENTRICULOSTOMY  2006    There were no vitals filed for this visit.  Subjective  Assessment - 04/25/17 1322    Subjective  No changes since last visit.  Just want to do whatever I need to do to move better.    Pertinent History  CVA 10 years ago, back surgery 5 years ago; HTN (controlled)    Patient Stated Goals  Pt's goals for therapy are to help me to lose my limp and catch my balance.    Currently in Pain?  No/denies                      [facility] Adult PT Treatment/Exercise - 04/26/17 0822      Transfers   Transfers  Sit to Stand;Stand to Sit    Sit to Stand  6: Modified independent (Device/Increase time);From chair/3-in-1;Without upper extremity assist;From bed    Stand to Sit  6: Modified independent (Device/Increase time);Without upper extremity assist;To bed;To chair/3-in-1    Number of Reps  -- Multiple reps throughout session    Transfer Cueing  Without cueing and when not performing sit<>stand as review of HEP, pt tends to have LLE forward when sitting and RLE more posterior wit sit>stand with decreased weightbearing on LLE.  With specific cues, pt is able to have more equal weightbearing through LLE with sit<>stand.      Ambulation/Gait   Ambulation/Gait  Yes    Ambulation/Gait Assistance  5:  Supervision    Ambulation Distance (Feet)  110 Feet x 2; 40 ft x 2 with foot-up brace trial    Assistive device  Straight cane    Gait Pattern  Step-to pattern;Step-through pattern;Decreased arm swing - left;Decreased step length - left;Decreased stance time - left;Decreased hip/knee flexion - left;Decreased dorsiflexion - left;Decreased weight shift to left;Decreased trunk rotation;Wide base of support;Poor foot clearance - left;Left genu recurvatum    Ambulation Surface  Level;Indoor    Gait velocity  15.65 sec with foot-up (2.1 ft/sec); 16.53 sec (1.98 ft/sec) with no foot up    Gait Comments  Trial of foot-up brace on LLE, with slight increase in L heelstrike/ankle dorsiflexion noted.  Pt continues to have significant L knee recurvatum with gait.       Standardized Balance Assessment   Standardized Balance Assessment  Timed Up and Go Test      Timed Up and Go Test   TUG  Normal TUG    Normal TUG (seconds)  16.28 17.59 sec with foot-up brace       Therapeutic Exercise Reviewed pt's HEP: -Squats at counter, x 10 reps -Sit<>stand x 10 reps, with cues for equal foot placement and increased forward lean to increased WB through LLE; additional 10 reps without cues (with pt performing 6/10 with feet in equal weightbearing position) -Seated knee flexion and extension -bridging x 10 reps -hooklying hip/knee flexion-marching x 10 reps (assistance for initial foot placement and cues for slowed pace of exercise) -heelslides, using sheet to assist LLE, x 10 reps       PT Education - 04/26/17 0831    Education provided  Yes    Education Details  Discussed progress with STGs, POC (including options to continue PT with attempts at trialing AFO for improved gait pattern and updates to HEP; pt agrees he would like to continue PT and would be open to trialing AFO/AFO consult for LLE)    Person(s) Educated  Patient    Methods  Explanation    Comprehension  Verbalized understanding       PT Short Term Goals - 04/25/17 1332      PT SHORT TERM GOAL #1   Title  Pt will be independent with HEP for improved strength, flexibility and balance.  TARGET 05/09/16    Baseline  HEP initiated at 03/26/17 visit; reviewed HEP with pt return demo understanding; reports doing HEP 1-2 times per day    Time  3    Period  Weeks per recert 25/00/37    Status  Achieved      PT SHORT TERM GOAL #2   Title  Pt will perform at least 6 of 10 reps of sit<>stand transfers with equal weightbearing through bilateral lower extremities.    Baseline  with cues, pt able to perform 6 out of 10 reps sit<>stand with equal WB 04/25/17    Time  3    Period  Weeks    Status  Partially Met      PT SHORT TERM GOAL #3   Title  Pt will improve TUG score to less than or equal to 15  seconds for decreased fall risk.    Baseline  04/25/17:  16.28 sec (improved from 16.91 sec at eval), 17.59 with foot-up AFO    Time  3    Period  Weeks per recert 04/88/89    Status  Not Met        PT Long Term Goals - 04/26/17 0830  PT LONG TERM GOAL #1   Title  Pt will verbalize understanding of fall prevention in the home environment.  UPDATED TARGET 06/06/17    Baseline  At fall risk per Merrilee Jansky and TUG scores    Time  7    Period  Weeks All LTGs per recert 26/94/85    Status  New      PT LONG TERM GOAL #2   Title  Pt will improve Berg Score to at least 42/56 for decreased fall risk    Baseline  Berg score 35/56 (Scores <45/56 indicate increased fall risk)    Time  7    Period  Weeks    Status  New      PT LONG TERM GOAL #3   Title  Pt will improve gait velocity to at least 2.62 ft/sec for improved gait efficiency and safety.    Baseline  gait velocity 2.09 t/sec (classified as limited community ambulator)    Time  7    Period  Weeks    Status  New      PT LONG TERM GOAL #4   Title  Pt will improve TUG score to less than or equal to 13.5 seconds for decreased fall risk.    Baseline  TUG score 16.91 sec    Time  7    Period  Weeks    Status  New            Plan - 04/26/17 4627    Clinical Impression Statement  Thorough review of of HEP from last 2 visits-pt appears independent with HEP performance and reports performing twice daily at home; therefore STG 1 met.  Pt continues to benefit from cues for LLE placement with transfers for increased weightshifting and weightbearing; therefore STG partially met.  STG not met for TUG, but he has improved TUG slightly with TUG score today 16.28 sec (compared to 16.91 sec at eval).  Trialed foot up brace on LLE to assist with foot clearance, which he does note to have improved foot clearance, but still significant L knee recurvatum and keeps L knee extended with gait.  Pt may benefit from (and agreed to) further gait trials with  AFO and possible AFO consult.  Pt has had one fall in the past month, and he would continue to benefit from skilled PT to further address strength, neuro-reed and gait training towards LTGs.        Rehab Potential  Fair    Clinical Impairments Affecting Rehab Potential  Length of time since initial CVA    PT Frequency  Other (comment) 1x/wk for 3 weeks, then 2x/wk for 4 weeks    PT Duration  Other (comment) see above-per recert 03/50/09    PT Treatment/Interventions  ADLs/Self Care Home Management;Gait training;Stair training;Functional mobility training;Therapeutic activities;Therapeutic exercise;Balance training;Patient/family education;Orthotic Fit/Training;Neuromuscular re-education    PT Next Visit Plan  Requesting insurance re-auth 2x/wk for 4 weeks; trial of AFO/heel wedge?; may request AFO consult from Dr. Naaman Plummer; continue activities to work on hip and knee flexion, gait activiities and L knee control    Consulted and Agree with Plan of Care  Patient       Patient will benefit from skilled therapeutic intervention in order to improve the following deficits and impairments:  Abnormal gait, Decreased balance, Decreased mobility, Decreased safety awareness, Difficulty walking, Decreased strength, Impaired flexibility  Visit Diagnosis: Muscle weakness (generalized)  Other abnormalities of gait and mobility  Unsteadiness on feet     Problem  List Patient Active Problem List   Diagnosis Date Noted  . Spondylosis without myelopathy or radiculopathy, lumbar region 02/05/2017  . Chronic low back pain 12/05/2016  . Abnormality of gait 12/26/2015  . Spastic hemiparesis of left nondominant side ([place]) 12/26/2015  . Chronic back pain 09/28/2015  . History of stroke   . Hemianopia, homonymous, right   . Stroke ([place]) 09/27/2015  . Acute CVA (cerebrovascular accident) ([place]) 09/27/2015  . Hyperlipidemia 07/16/2014  . Iron deficiency anemia 07/16/2014  . Prolonged Q-T interval on ECG  07/16/2014  . CVA (cerebral infarction) 07/16/2014  . Acute ischemic left PCA stroke ([place]) 07/15/2014  . CAD S/P PCI of dRCA - 2 DES (2.25 x 28 Promus Premier & 2.75 x 28 Promus Premier -- postdilated to 2.75 mm) - distal RCA branches small & diffusely diseased. 02/18/2014    Class: Status post  . History of ST elevation myocardial infarction (STEMI) 02/17/2014  . Essential hypertension 02/17/2014  . History of cerebellar hemorrhage 2006 02/17/2014  . GERD (gastroesophageal reflux disease) 02/17/2014  . Hyperglycemia 02/17/2014  . Obesity 02/17/2014  . ST elevation myocardial infarction (STEMI) involving right coronary artery in recovery phase ([place]) 02/17/2014    Alarik Radu W. 04/26/2017, 8:41 AM  Frazier Butt., PT   [facility] [address] [place], [place], [address] Phone: [phone]   Fax:  [phone]  Name: George Villa MRN: [id number] Date of Birth: [birth year]

## 2017-04-29 ENCOUNTER — Ambulatory Visit: Payer: Medicaid Other | Admitting: Physical Therapy

## 2017-05-02 ENCOUNTER — Ambulatory Visit: Payer: Medicaid Other | Admitting: Physical Therapy

## 2017-05-02 DIAGNOSIS — R2689 Other abnormalities of gait and mobility: Secondary | ICD-10-CM

## 2017-05-02 DIAGNOSIS — M6281 Muscle weakness (generalized): Secondary | ICD-10-CM

## 2017-05-02 DIAGNOSIS — R2681 Unsteadiness on feet: Secondary | ICD-10-CM

## 2017-05-02 NOTE — Therapy (Signed)
[facility] [address] [place], [place], [address] Phone: [phone]   Fax:  [phone]  Physical Therapy Treatment  Patient Details  Name: George Villa MRN: [id number] Date of Birth: [birth year] Referring Provider: Alger Simons   Encounter Date: 05/02/2017  PT End of Session - 05/02/17 1936    Visit Number  5    Number of Visits  13    Date for PT Re-Evaluation  02/77/41 per recert 28/78/67    Authorization Type  Medicaid    Authorization Time Period  3 visits 04/15/2017 - 05/05/2017; 3 additional visits 05/06/17-06/02/17    Authorization - Visit Number  3    Authorization - Number of Visits  6    PT Start Time  1319    PT Stop Time  1400    PT Time Calculation (min)  41 min    Equipment Utilized During Treatment  Gait belt    Activity Tolerance  Patient tolerated treatment well    Behavior During Therapy  [facility] for tasks assessed/performed       Past Medical History:  Diagnosis Date  . Acid reflux   . 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  . Chronic lower back pain   . Headache    "monthly" (02/18/2014)  . Hypertension   . Inferior MI ([place]) 02/17/2014   Archie Endo 02/17/2014  . Stroke [facility]) 2006   "left leg weak since" (02/18/2014)    Past Surgical History:  Procedure Laterality Date  . BACK SURGERY    . BRAIN HEMATOMA EVACUATION  2006  . LEFT HEART CATHETERIZATION WITH CORONARY ANGIOGRAM N/A 02/17/2014   Procedure: LEFT HEART CATHETERIZATION WITH CORONARY ANGIOGRAM;  Surgeon: Sinclair Grooms, MD;  Location: [facility] CATH LAB;  Service: Cardiovascular;  Laterality: N/A;  . LUMBAR [place] SURGERY  2013  . PERCUTANEOUS CORONARY STENT INTERVENTION (PCI-S)  02/17/2014   Procedure: PERCUTANEOUS CORONARY STENT INTERVENTION (PCI-S);  Surgeon: Sinclair Grooms, MD;  Location: [facility] CATH LAB;  Service: Cardiovascular;;  . TRACHEOSTOMY  05/2004  .  VENTRICULOSTOMY  2006    There were no vitals filed for this visit.  Subjective Assessment - 05/02/17 1320    Subjective  No changes, no falls.    Pertinent History  CVA 10 years ago, back surgery 5 years ago; HTN (controlled)    Patient Stated Goals  Pt's goals for therapy are to help me to lose my limp and catch my balance.    Currently in Pain?  No/denies                      [facility] Adult PT Treatment/Exercise - 05/02/17 1331      Ambulation/Gait   Ambulation/Gait  Yes    Ambulation/Gait Assistance  4: Min guard;4: Min assist    Ambulation/Gait Assistance Details  Trial of gait with toe-off AFO, then toe-off AFO with (brown) heel wedge, on LLE    Ambulation Distance (Feet)  50 Feet x 2    Assistive device  Straight cane    Gait Pattern  Step-to pattern;Step-through pattern;Decreased arm swing - left;Decreased step length - left;Decreased stance time - left;Decreased hip/knee flexion - left;Decreased dorsiflexion - left;Decreased weight shift to left;Decreased trunk rotation;Wide base of support;Poor foot clearance - left;Left genu recurvatum    Ambulation Surface  Level;Indoor    Pre-Gait Activities  In parallel bars:  forward/back walking with tactile and verbal cues, visual  cues of mirror for L hip elevation and hip flexion, multiple reps forward and back in parallel bars.    Gait Comments  Wearing trial toe-off AFO and brown heel wedge, gait x 230 ft, with min assit, with cues for slowed pace, for increased L stance time and for increased L hip/knee flexion.  Pt continues to have several episodes of L foot drag and pitching forward, with PT assistance to regain balance.      High Level Balance   High Level Balance Comments  Hip/knee flexion in parallel bars with L foot on towel, sliding forward and back to increased L hip/knee flexion      Neuro Re-ed    Neuro Re-ed Details   In parallel bars wearing L toe-off AFO and brown heel wedge, worked on activities to increase  L hip/knee flexion:  marching in place x 5 reps, then forward lunge on LLE with tactile, verbal, visual cues for forward weightshift and knee flexion (pt tends to also have trunk flexion); lateral weightshifting, stagger stance forward and back weightshifting, then R as stance with LLE toe off and into swing phase.  Pt needs multi-modal cues and often has difficulty with correct technique of exercise to increase hip/knee flexion on LLE.  Performed mini-squats in parallel bars x 10 reps.  With all NMR activities in bars, therapist provides cues also to try to decrease  L knee recurvatum.           Provided education on rationale behind use of AFO and AFO/heelwedge (versus foot-up brace tried last visit). Discussed process for obtaining AFO order, orthotic consult and need for continued neuro-reeducation and training to LLE.  Discussed limitations including increased LLE extensor tone and length of time since CVA and his preference for gait pattern.  PT Education - 05/02/17 1936    Education provided  Yes    Education Details  Discussed plan for orthotic consult for possible AFO to control L foot drag and recurvatum-pt in agreement to request MD order.    Person(s) Educated  Patient    Methods  Explanation;Demonstration    Comprehension  Verbalized understanding       PT Short Term Goals - 04/25/17 1332      PT SHORT TERM GOAL #1   Title  Pt will be independent with HEP for improved strength, flexibility and balance.  TARGET 05/09/16    Baseline  HEP initiated at 03/26/17 visit; reviewed HEP with pt return demo understanding; reports doing HEP 1-2 times per day    Time  3    Period  Weeks per recert 10/91/45    Status  Achieved      PT SHORT TERM GOAL #2   Title  Pt will perform at least 6 of 10 reps of sit<>stand transfers with equal weightbearing through bilateral lower extremities.    Baseline  with cues, pt able to perform 6 out of 10 reps sit<>stand with equal WB 04/25/17    Time  3     Period  Weeks    Status  Partially Met      PT SHORT TERM GOAL #3   Title  Pt will improve TUG score to less than or equal to 15 seconds for decreased fall risk.    Baseline  04/25/17:  16.28 sec (improved from 16.91 sec at eval), 17.59 with foot-up AFO    Time  3    Period  Weeks per recert 60/27/82    Status  Not Met  PT Long Term Goals - 04/26/17 0830      PT LONG TERM GOAL #1   Title  Pt will verbalize understanding of fall prevention in the home environment.  UPDATED TARGET 06/06/17    Baseline  At fall risk per Merrilee Jansky and TUG scores    Time  7    Period  Weeks All LTGs per recert 01/14/11    Status  New      PT LONG TERM GOAL #2   Title  Pt will improve Berg Score to at least 42/56 for decreased fall risk    Baseline  Berg score 35/56 (Scores <45/56 indicate increased fall risk)    Time  7    Period  Weeks    Status  New      PT LONG TERM GOAL #3   Title  Pt will improve gait velocity to at least 2.62 ft/sec for improved gait efficiency and safety.    Baseline  gait velocity 2.09 t/sec (classified as limited community ambulator)    Time  7    Period  Weeks    Status  New      PT LONG TERM GOAL #4   Title  Pt will improve TUG score to less than or equal to 13.5 seconds for decreased fall risk.    Baseline  TUG score 16.91 sec    Time  7    Period  Weeks    Status  New            Plan - 05/02/17 1939    Clinical Impression Statement  Worked on pre-gait and gait activities this visit, with trial of toe-off AFO and heelwedge, which pt continues to have significant LLE recurvatum, but with training and cues is able to slow gait pattern for increased L toe off and increased initial L hip/knee flexion.  He does continue to have episodes when going to fast with gait, that he experiences L foot drag.  Will likely benefit from orthotic consult for appropriate orthotic to assist with L foot drag and L knee recurvatum.  Pt in agreement to continue to address gait,  strength, and neuro-reed to LLE for improved gait efficiency and safety.    Rehab Potential  Fair    Clinical Impairments Affecting Rehab Potential  Length of time since initial CVA    PT Frequency  Other (comment) 1x/wk for 3 weeks, then 2x/wk for 4 weeks    PT Duration  Other (comment) see above-per recert 19/75/88    PT Treatment/Interventions  ADLs/Self Care Home Management;Gait training;Stair training;Functional mobility training;Therapeutic activities;Therapeutic exercise;Balance training;Patient/family education;Orthotic Fit/Training;Neuromuscular re-education    PT Next Visit Plan  Request AFO order from Dr. Naaman Plummer; continue gait training, neuro-re-education for L hip/knee flexion and timing and coordination of gait.    Consulted and Agree with Plan of Care  Patient       Patient will benefit from skilled therapeutic intervention in order to improve the following deficits and impairments:  Abnormal gait, Decreased balance, Decreased mobility, Decreased safety awareness, Difficulty walking, Decreased strength, Impaired flexibility  Visit Diagnosis: Muscle weakness (generalized)  Unsteadiness on feet  Other abnormalities of gait and mobility     Problem List Patient Active Problem List   Diagnosis Date Noted  . Spondylosis without myelopathy or radiculopathy, lumbar region 02/05/2017  . Chronic low back pain 12/05/2016  . Abnormality of gait 12/26/2015  . Spastic hemiparesis of left nondominant side ([place]) 12/26/2015  . Chronic back pain 09/28/2015  .  History of stroke   . Hemianopia, homonymous, right   . Stroke ([place]) 09/27/2015  . Acute CVA (cerebrovascular accident) ([place]) 09/27/2015  . Hyperlipidemia 07/16/2014  . Iron deficiency anemia 07/16/2014  . Prolonged Q-T interval on ECG 07/16/2014  . CVA (cerebral infarction) 07/16/2014  . Acute ischemic left PCA stroke ([place]) 07/15/2014  . CAD S/P PCI of dRCA - 2 DES (2.25 x 28 Promus Premier & 2.75 x 28 Promus Premier --  postdilated to 2.75 mm) - distal RCA branches small & diffusely diseased. 02/18/2014    Class: Status post  . History of ST elevation myocardial infarction (STEMI) 02/17/2014  . Essential hypertension 02/17/2014  . History of cerebellar hemorrhage 2006 02/17/2014  . GERD (gastroesophageal reflux disease) 02/17/2014  . Hyperglycemia 02/17/2014  . Obesity 02/17/2014  . ST elevation myocardial infarction (STEMI) involving right coronary artery in recovery phase ([place]) 02/17/2014    Asani Mcburney W. 05/02/2017, 7:44 PM  Frazier Butt., PT   [facility] [address] [place], [place], [address] Phone: [phone]   Fax:  [phone]  Name: George Villa MRN: [id number] Date of Birth: 06-Nov-1965

## 2017-05-03 ENCOUNTER — Telehealth: Payer: Self-pay | Admitting: Physical Therapy

## 2017-05-03 NOTE — Telephone Encounter (Signed)
Dr. Riley KillSwartz, I have been working with Guss BundeAnthony Macapagal for several visits for PT.  In order to attempt to address gait abnormalities, we have trialed several AFO options.  He may benefit from Left AFO and from orthotic consult.  If you agree, could you please write order for Left AFO and for orthotic consult?  Thank you.  Lonia Bloodmy Marriott, PT 05/03/17 1:45 PM Phone: [phone] Fax: [phone]

## 2017-05-03 NOTE — Telephone Encounter (Signed)
I will fax out an order to [facility] on Monday. Could someone at the office remind me so that I can complete the rx?  Thanks!

## 2017-05-06 NOTE — Telephone Encounter (Signed)
Faxed to [facility]

## 2017-05-09 ENCOUNTER — Telehealth: Payer: Self-pay

## 2017-05-09 ENCOUNTER — Telehealth: Payer: Self-pay | Admitting: Physical Medicine & Rehabilitation

## 2017-05-09 DIAGNOSIS — M47816 Spondylosis without myelopathy or radiculopathy, lumbar region: Secondary | ICD-10-CM

## 2017-05-09 DIAGNOSIS — I63532 Cerebral infarction due to unspecified occlusion or stenosis of left posterior cerebral artery: Secondary | ICD-10-CM
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
## 2017-05-10 NOTE — Telephone Encounter (Signed)
With the police report, I will give him a second chance. Any further problems such as this, however, and I will no longer prescribe controlled substances moving forward.

## 2017-05-13 ENCOUNTER — Ambulatory Visit: Payer: Medicaid Other | Attending: Physical Medicine & Rehabilitation | Admitting: Rehabilitation

## 2017-05-13 ENCOUNTER — Telehealth: Payer: Self-pay | Admitting: Physical Medicine & Rehabilitation

## 2017-05-13 DIAGNOSIS — R2681 Unsteadiness on feet: Secondary | ICD-10-CM | POA: Insufficient documentation

## 2017-05-13 DIAGNOSIS — R2689 Other abnormalities of gait and mobility: Secondary | ICD-10-CM | POA: Insufficient documentation

## 2017-05-13 DIAGNOSIS — M6281 Muscle weakness (generalized): Secondary | ICD-10-CM | POA: Insufficient documentation

## 2017-05-13 MED ORDER — OXYCODONE-ACETAMINOPHEN 10-325 MG PO TABS: 1.0000 | ORAL_TABLET | Freq: Four times a day (QID) | ORAL | 0 refills | Status: DC | PRN

## 2017-05-13 NOTE — Telephone Encounter (Signed)
Pt said pharmacy has contacted us for refill & we are not responding... [facility] for oxycodone  Ethelene Brownsnthony [phone]

## 2017-05-13 NOTE — Telephone Encounter (Signed)
Contacted patient and pharmacy and exchanged needed information.
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
## 2017-05-20 ENCOUNTER — Ambulatory Visit: Payer: Medicaid Other | Admitting: Rehabilitation

## 2017-05-20 ENCOUNTER — Encounter: Payer: Self-pay | Admitting: Rehabilitation

## 2017-05-20 DIAGNOSIS — R2681 Unsteadiness on feet: Secondary | ICD-10-CM

## 2017-05-20 DIAGNOSIS — M6281 Muscle weakness (generalized): Secondary | ICD-10-CM | POA: Diagnosis present

## 2017-05-20 DIAGNOSIS — R2689 Other abnormalities of gait and mobility: Secondary | ICD-10-CM | POA: Diagnosis present

## 2017-05-20 NOTE — Therapy (Signed)
[facility] [address] [place], [place], [address] Phone: [phone]   Fax:  [phone]  Physical Therapy Treatment  Patient Details  Name: George Villa MRN: [id number] Date of Birth: 1965/06/07 Referring Provider: Alger Simons   Encounter Date: 05/20/2017  PT End of Session - 05/20/17 1320    Visit Number  6    Number of Visits  13    Date for PT Re-Evaluation  65/46/50 per recert 35/46/56    Authorization Type  Medicaid    Authorization Time Period  3 visits 04/15/2017 - 05/05/2017; 3 additional visits 05/06/17-06/02/17    Authorization - Visit Number  4    Authorization - Number of Visits  6    PT Start Time  1318    PT Stop Time  1400    PT Time Calculation (min)  42 min    Equipment Utilized During Treatment  Gait belt    Activity Tolerance  Patient tolerated treatment well    Behavior During Therapy  [facility] for tasks assessed/performed       Past Medical History:  Diagnosis Date  . Acid reflux   . 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  . Chronic lower back pain   . Headache    "monthly" (02/18/2014)  . Hypertension   . Inferior MI ([place]) 02/17/2014   Archie Endo 02/17/2014  . Stroke [facility]) 2006   "left leg weak since" (02/18/2014)    Past Surgical History:  Procedure Laterality Date  . BACK SURGERY    . BRAIN HEMATOMA EVACUATION  2006  . LEFT HEART CATHETERIZATION WITH CORONARY ANGIOGRAM N/A 02/17/2014   Procedure: LEFT HEART CATHETERIZATION WITH CORONARY ANGIOGRAM;  Surgeon: Sinclair Grooms, MD;  [facility];  Service: Cardiovascular;  Laterality: N/A;  . LUMBAR [place] SURGERY  2013  . PERCUTANEOUS CORONARY STENT INTERVENTION (PCI-S)  02/17/2014   Procedure: PERCUTANEOUS CORONARY STENT INTERVENTION (PCI-S);  Surgeon: Sinclair Grooms, MD;  Location: [facility] CATH LAB;  Service: Cardiovascular;;  . TRACHEOSTOMY  05/2004  .  VENTRICULOSTOMY  2006    There were no vitals filed for this visit.  Subjective Assessment - 05/20/17 1319    Subjective  Reports he is getting his brace on 2/25 from [facility], no other changes since being here.     Pertinent History  CVA 10 years ago, back surgery 5 years ago; HTN (controlled)    Patient Stated Goals  Pt's goals for therapy are to help me to lose my limp and catch my balance.    Currently in Pain?  No/denies                      [facility] Adult PT Treatment/Exercise - 05/20/17 0001      Ambulation/Gait   Ambulation/Gait  Yes    Ambulation/Gait Assistance  4: Min guard;4: Min assist    Ambulation/Gait Assistance Details  Had pt ambulate several times throughout session following NMR tasks to better assess carryover.  Had pt utilize [facility] at times for gait with emphasis on decreased gait speed, decreased effort of clearing LLE, improving L forward/lateral weight shift onto LLE in stance.  Also worked on this without use of AD with increased facilitation from PT.      Ambulation Distance (Feet)  400 Feet all together    Assistive device  Straight cane none    Gait Pattern  Step-to pattern;Step-through pattern;Decreased arm  swing - left;Decreased step length - left;Decreased stance time - left;Decreased hip/knee flexion - left;Decreased dorsiflexion - left;Decreased weight shift to left;Decreased trunk rotation;Wide base of support;Poor foot clearance - left;Left genu recurvatum    Ambulation Surface  Level;Indoor      Neuro Re-ed    Neuro Re-ed Details   In // bars had pt perform standing squats x 8 reps to reduce extensor tone prior to more gait; progressed to side stepping in squats x 2 reps in // bars.  Cues for continued B knee flex.  Maintained LLE on 2" step and had pt lower and elevate R LE to ground, again to encourage flexion pattern.  Progressed to tall kneeling with kaye bench performing squats x 5 reps with UE support and without UE support x 5 reps>tall  kneeling to half kneeling x 8 reps with single UE support.  Quadruped moving LLE into "dog" position x 10 reps for improved LLE and trunk flexion/rotation>moving LUE down under body and back to extended position for increased trunk flex/rotation (x 10 reps).  Prone L knee flex x 10 reps with tapping to hamstrings for improved activation.  Note marked difficulty with controlled lowering due to weakness and ending with L hip extension in prone x 10 reps with tactile assist to reduce trunk compensations.  Pt with increased fatigue following tasks.              PT Education - 05/20/17 2123    Education provided  Yes    Education Details  purpose of AFO    Person(s) Educated  Patient    Methods  Explanation    Comprehension  Verbalized understanding       PT Short Term Goals - 04/25/17 1332      PT SHORT TERM GOAL #1   Title  Pt will be independent with HEP for improved strength, flexibility and balance.  TARGET 05/09/16    Baseline  HEP initiated at 03/26/17 visit; reviewed HEP with pt return demo understanding; reports doing HEP 1-2 times per day    Time  3    Period  Weeks per recert 30/16/01    Status  Achieved      PT SHORT TERM GOAL #2   Title  Pt will perform at least 6 of 10 reps of sit<>stand transfers with equal weightbearing through bilateral lower extremities.    Baseline  with cues, pt able to perform 6 out of 10 reps sit<>stand with equal WB 04/25/17    Time  3    Period  Weeks    Status  Partially Met      PT SHORT TERM GOAL #3   Title  Pt will improve TUG score to less than or equal to 15 seconds for decreased fall risk.    Baseline  04/25/17:  16.28 sec (improved from 16.91 sec at eval), 17.59 with foot-up AFO    Time  3    Period  Weeks per recert 09/32/35    Status  Not Met        PT Long Term Goals - 04/26/17 0830      PT LONG TERM GOAL #1   Title  Pt will verbalize understanding of fall prevention in the home environment.  UPDATED TARGET 06/06/17     Baseline  At fall risk per Merrilee Jansky and TUG scores    Time  7    Period  Weeks All LTGs per recert 57/32/20    Status  New  PT LONG TERM GOAL #2   Title  Pt will improve Berg Score to at least 42/56 for decreased fall risk    Baseline  Berg score 35/56 (Scores <45/56 indicate increased fall risk)    Time  7    Period  Weeks    Status  New      PT LONG TERM GOAL #3   Title  Pt will improve gait velocity to at least 2.62 ft/sec for improved gait efficiency and safety.    Baseline  gait velocity 2.09 t/sec (classified as limited community ambulator)    Time  7    Period  Weeks    Status  New      PT LONG TERM GOAL #4   Title  Pt will improve TUG score to less than or equal to 13.5 seconds for decreased fall risk.    Baseline  TUG score 16.91 sec    Time  7    Period  Weeks    Status  New            Plan - 05/20/17 2124    Clinical Impression Statement  Skilled session focused on LLE and trunk NMR to encourage flexion and rotation to decrease tone, promote LLE WB in forced use exercises, and improved L hip protraction.  Pt does well with exercises, but note little carryover to gait and he continues to need max cues for slower gait speed.      Rehab Potential  Fair    Clinical Impairments Affecting Rehab Potential  Length of time since initial CVA    PT Frequency  Other (comment) 1x/wk for 3 weeks, then 2x/wk for 4 weeks    PT Duration  Other (comment) see above-per recert 70/01/74    PT Treatment/Interventions  ADLs/Self Care Home Management;Gait training;Stair training;Functional mobility training;Therapeutic activities;Therapeutic exercise;Balance training;Patient/family education;Orthotic Fit/Training;Neuromuscular re-education    PT Next Visit Plan  Amy-he is supposed to get AFO on 2/25-do we want to ask for more visits to just check AFO?, neuro-re-education for L hip/knee flexion and timing and coordination of gait.    Consulted and Agree with Plan of Care  Patient        Patient will benefit from skilled therapeutic intervention in order to improve the following deficits and impairments:  Abnormal gait, Decreased balance, Decreased mobility, Decreased safety awareness, Difficulty walking, Decreased strength, Impaired flexibility  Visit Diagnosis: Muscle weakness (generalized)  Unsteadiness on feet  Other abnormalities of gait and mobility     Problem List Patient Active Problem List   Diagnosis Date Noted  . Spondylosis without myelopathy or radiculopathy, lumbar region 02/05/2017  . Chronic low back pain 12/05/2016  . Abnormality of gait 12/26/2015  . Spastic hemiparesis of left nondominant side ([place]) 12/26/2015  . Chronic back pain 09/28/2015  . History of stroke   . Hemianopia, homonymous, right   . Stroke ([place]) 09/27/2015  . Acute CVA (cerebrovascular accident) ([place]) 09/27/2015  . Hyperlipidemia 07/16/2014  . Iron deficiency anemia 07/16/2014  . Prolonged Q-T interval on ECG 07/16/2014  . CVA (cerebral infarction) 07/16/2014  . Acute ischemic left PCA stroke ([place]) 07/15/2014  . CAD S/P PCI of dRCA - 2 DES (2.25 x 28 Promus Premier & 2.75 x 28 Promus Premier -- postdilated to 2.75 mm) - distal RCA branches small & diffusely diseased. 02/18/2014    Class: Status post  . History of ST elevation myocardial infarction (STEMI) 02/17/2014  . Essential hypertension 02/17/2014  . History of cerebellar hemorrhage  2006 02/17/2014  . GERD (gastroesophageal reflux disease) 02/17/2014  . Hyperglycemia 02/17/2014  . Obesity 02/17/2014  . ST elevation myocardial infarction (STEMI) involving right coronary artery in recovery phase ([place]) 02/17/2014    Cameron Sprang, PT, MPT [facility] [address] [place], [place], [address] Phone: [phone]   Fax:  [phone] 05/20/17, 9:29 PM  Name: ZACKARY MCKEONE MRN: [id number] Date of Birth: 06-Feb-1966

## 2017-05-22 ENCOUNTER — Telehealth: Payer: Self-pay | Admitting: Physical Medicine & Rehabilitation

## 2017-05-22 NOTE — Telephone Encounter (Signed)
Jasmine from [facility] is requesting a letter of medical necessity. Please advise.  °

## 2017-05-22 NOTE — Telephone Encounter (Signed)
A letter of necessity for what?  I need more specifics than that.  Typically they have a form that we complete for necessity for orthotics and a prosthetics they are providing.

## 2017-05-24 ENCOUNTER — Other Ambulatory Visit: Payer: Self-pay

## 2017-05-24 ENCOUNTER — Encounter: Payer: Medicaid Other | Attending: Physical Medicine & Rehabilitation | Admitting: Registered Nurse

## 2017-05-24 ENCOUNTER — Encounter: Payer: Self-pay | Admitting: Registered Nurse

## 2017-05-24 VITALS — BP 124/86 | HR 94

## 2017-05-24 DIAGNOSIS — I10 Essential (primary) hypertension: Secondary | ICD-10-CM | POA: Insufficient documentation

## 2017-05-24 DIAGNOSIS — G8929 Other chronic pain: Secondary | ICD-10-CM | POA: Insufficient documentation

## 2017-05-24 DIAGNOSIS — I69314 Frontal lobe and executive function deficit following cerebral infarction: Secondary | ICD-10-CM | POA: Insufficient documentation

## 2017-05-24 DIAGNOSIS — K219 Gastro-esophageal reflux disease without esophagitis: Secondary | ICD-10-CM | POA: Diagnosis not present

## 2017-05-24 DIAGNOSIS — G8114 Spastic hemiplegia affecting left nondominant side: Secondary | ICD-10-CM

## 2017-05-24 DIAGNOSIS — M545 Low back pain: Secondary | ICD-10-CM | POA: Diagnosis not present

## 2017-05-24 DIAGNOSIS — R51 Headache: Secondary | ICD-10-CM | POA: Insufficient documentation

## 2017-05-24 DIAGNOSIS — I69393 Ataxia following cerebral infarction: Secondary | ICD-10-CM | POA: Diagnosis present

## 2017-05-24 DIAGNOSIS — Z79899 Other long term (current) drug therapy: Secondary | ICD-10-CM | POA: Diagnosis not present

## 2017-05-24 DIAGNOSIS — I69354 Hemiplegia and hemiparesis following cerebral infarction affecting left non-dominant side: Secondary | ICD-10-CM | POA: Insufficient documentation

## 2017-05-24 DIAGNOSIS — M47816 Spondylosis without myelopathy or radiculopathy, lumbar region: Secondary | ICD-10-CM

## 2017-05-24 DIAGNOSIS — G894 Chronic pain syndrome: Secondary | ICD-10-CM | POA: Diagnosis not present

## 2017-05-24 DIAGNOSIS — Z5181 Encounter for therapeutic drug level monitoring: Secondary | ICD-10-CM | POA: Diagnosis not present

## 2017-05-24 MED ORDER — OXYCODONE-ACETAMINOPHEN 10-325 MG PO TABS: 1.0000 | ORAL_TABLET | Freq: Four times a day (QID) | ORAL | 0 refills | Status: DC | PRN

## 2017-05-24 NOTE — Progress Notes (Signed)
Subjective:    Patient ID: George Villa, male    DOB: 10/02/1965, 52 y.o.   MRN: [id number]  HPI: [name] is a 52 year old male who returns for follow up appointment and medication refill. He states his pain is located in his lower back. He rates his pain 5. His current exercise regime is attending physical therapy weekly, walking and performing stretching exercises.   Mr. Gabler Morphine equivalent is 86. . UDS ordered today/ unable to urinate. Oral Swab Performed.   Pain Inventory Average Pain 4 Pain Right Now 5 My pain is na  In the last 24 hours, has pain interfered with the following? General activity 5 Relation with others 5 Enjoyment of life 5 What TIME of day is your pain at its worst? na Sleep (in general) NA  Pain is worse with: walking and standing Pain improves with: medication Relief from Meds: na  Mobility use a cane ability to climb steps?  yes do you drive?  yes  Function employed # of hrs/week 15  Neuro/Psych trouble walking  Prior Studies Any changes since last visit?  no  Physicians involved in your care Any changes since last visit?  no   Family History  Problem Relation Age of Onset  . Heart Problems Father   . Hypertension Mother   . Stroke Unknown    Social History   Socioeconomic History  . Marital status: Single    Spouse name: None  . Number of children: None  . Years of education: None  . Highest education level: None  Social Needs  . Financial resource strain: None  . Food insecurity - worry: None  . Food insecurity - inability: None  . Transportation needs - medical: None  . Transportation needs - non-medical: None  Occupational History  . Occupation: umemployed     Comment: Works in [employer]  . Smoking status: Never Smoker  . Smokeless tobacco: Never Used  Substance and Sexual Activity  . Alcohol use: Yes    Alcohol/week: 3.6 oz    Types: 6 Cans of beer per week    Comment:  OCCASSIONALLY   . Drug use: No  . Sexual activity: Yes  Other Topics Concern  . None  Social History Narrative   7 cups of caffeine a week    Past Surgical History:  Procedure Laterality Date  . BACK SURGERY    . BRAIN HEMATOMA EVACUATION  2006  . LEFT HEART CATHETERIZATION WITH CORONARY ANGIOGRAM N/A 02/17/2014   Procedure: LEFT HEART CATHETERIZATION WITH CORONARY ANGIOGRAM;  Surgeon: [name], MD;  Location: [facility] CATH LAB;  Service: Cardiovascular;  Laterality: N/A;  . LUMBAR [place] SURGERY  2013  . PERCUTANEOUS CORONARY STENT INTERVENTION (PCI-S)  02/17/2014   Procedure: PERCUTANEOUS CORONARY STENT INTERVENTION (PCI-S);  Surgeon: [name], MD;  Location: [facility] CATH LAB;  Service: Cardiovascular;;  . TRACHEOSTOMY  05/2004  . VENTRICULOSTOMY  2006   Past Medical History:  Diagnosis Date  . Acid reflux   . 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  . Chronic lower back pain   . Headache    "monthly" (02/18/2014)  . Hypertension   . 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 Stroke [facility]) 2006   "left leg weak since" (02/18/2014)   BP 124/86   Pulse 94   SpO2 96%   Opioid Risk Score:  0  Fall Risk Score:  `1  Depression screen PHQ 2/9  [facility] 05/24/2017 04/24/2017 09/28/2015  Decreased Interest 0 0 0  Down, Depressed, Hopeless 0 0 0  PHQ - 2 Score 0 0 0    Review of Systems  Constitutional: Negative.   HENT: Negative.   Eyes: Negative.   Respiratory: Negative.   Cardiovascular: Negative.   Gastrointestinal: Negative.   Endocrine: Negative.   Genitourinary: Negative.   Musculoskeletal: Positive for gait problem.  Skin: Negative.   Allergic/Immunologic: Negative.   Hematological: Negative.   Psychiatric/Behavioral: Negative.   All other systems reviewed and are negative.      Objective:   Physical Exam  Constitutional: He is oriented to person, place, and  time. He appears well-developed and well-nourished.  HENT:  Head: Normocephalic and atraumatic.  Neck: Normal range of motion. Neck supple.  Cardiovascular: Normal rate and regular rhythm.  Pulmonary/Chest: Effort normal and breath sounds normal.  Musculoskeletal:  Normal Muscle Bulk and Muscle Testing Reveals: Upper Extremities: Full ROM and Muscle Strength 5/5 Back without spinal tenderness noted Lower Extremities: Right: Full ROM and Muscle Strength 5/5 Left: Decreased ROM and Muscle Strength 4/.5 Arises from Table Slowly using straight cane for support Antalgic Gait  Neurological: He is alert and oriented to person, place, and time.  Skin: Skin is warm and dry.  Psychiatric: He has a normal mood and affect.  Nursing note and vitals reviewed.         Assessment & Plan:  1. History of multiple strokes with gait disorder, ongoing ataxia andspasticleft hemiparesis: Continue Physical Therapy and HEP as Tolerated. Continue Baclofen.  2. Chronic low back pain with documented facet arthropathy and history of intradural defect/schwanomma requiring resection, per Dr. Riley KillSwartz Note.  Refilled:Percocet10/325 one q6 prn #120.  We will continue the opioid monitoring program, this consists of regular clinic visits, examinations, routine drug screening, pill counts as well as use of [place] Cont[place]. [place]  20 minutes of face to face patient care time was spent during this visit. All questions were encouraged and answered.  F/U in 1 month

## 2017-05-27 ENCOUNTER — Ambulatory Visit: Payer: Medicaid Other | Admitting: Physical Therapy

## 2017-05-27 ENCOUNTER — Encounter: Payer: Self-pay | Admitting: Physical Therapy

## 2017-05-27 DIAGNOSIS — M6281 Muscle weakness (generalized): Secondary | ICD-10-CM

## 2017-05-27 DIAGNOSIS — R2681 Unsteadiness on feet: Secondary | ICD-10-CM

## 2017-05-27 DIAGNOSIS — R2689 Other abnormalities of gait and mobility: Secondary | ICD-10-CM

## 2017-05-27 NOTE — Therapy (Signed)
[facility] [address] [place], [place], [address] Phone: [phone]   Fax:  [phone]  Physical Therapy Treatment  Patient Details  Name: George Villa MRN: [id number] Date of Birth: Jan 03, 1966 Referring Provider: Alger Simons   Encounter Date: 05/27/2017  PT End of Session - 05/27/17 2152    Visit Number  7    Number of Visits  13    Date for PT Re-Evaluation  74/08/14 per recert 48/18/56    Authorization Type  Medicaid    Authorization Time Period  3 visits 04/15/2017 - 05/05/2017; 3 additional visits 05/06/17-06/02/17    Authorization - Visit Number  5    Authorization - Number of Visits  6    PT Start Time  3149    PT Stop Time  1359    PT Time Calculation (min)  40 min    Equipment Utilized During Treatment  Gait belt    Activity Tolerance  Patient tolerated treatment well    Behavior During Therapy  [facility] for tasks assessed/performed       Past Medical History:  Diagnosis Date  . Acid reflux   . 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  . Chronic lower back pain   . Headache    "monthly" (02/18/2014)  . Hypertension   . Inferior MI ([place]) 02/17/2014   Archie Endo 02/17/2014  . Stroke [facility]) 2006   "left leg weak since" (02/18/2014)    Past Surgical History:  Procedure Laterality Date  . BACK SURGERY    . BRAIN HEMATOMA EVACUATION  2006  . LEFT HEART CATHETERIZATION WITH CORONARY ANGIOGRAM N/A 02/17/2014   Procedure: LEFT HEART CATHETERIZATION WITH CORONARY ANGIOGRAM;  Surgeon: Sinclair Grooms, MD;  Location: [facility] CATH LAB;  Service: Cardiovascular;  Laterality: N/A;  . LUMBAR [place] SURGERY  2013  . PERCUTANEOUS CORONARY STENT INTERVENTION (PCI-S)  02/17/2014   Procedure: PERCUTANEOUS CORONARY STENT INTERVENTION (PCI-S);  Surgeon: Sinclair Grooms, MD;  Location: [facility] CATH LAB;  Service: Cardiovascular;;  . TRACHEOSTOMY  05/2004  .  VENTRICULOSTOMY  2006    There were no vitals filed for this visit.  Subjective Assessment - 05/27/17 1321    Subjective  Still supposed to get the brace from [employer] on 06/03/17.    Pertinent History  CVA 10 years ago, back surgery 5 years ago; HTN (controlled)    Patient Stated Goals  Pt's goals for therapy are to help me to lose my limp and catch my balance.    Currently in Pain?  No/denies                      [facility] Adult PT Treatment/Exercise - 05/27/17 0001      Ambulation/Gait   Ambulation/Gait  Yes    Ambulation/Gait Assistance  4: Min guard;5: Supervision    Ambulation/Gait Assistance Details  Cues to slow pace throughout session.  Pt noted to have slightly improved L knee flexion with slowed pattern of gait.    Ambulation Distance (Feet)  100 Feet x 2, then 50 ft x 2    Assistive device  Straight cane    Gait Pattern  Step-to pattern;Step-through pattern;Decreased arm swing - left;Decreased step length - left;Decreased stance time - left;Decreased hip/knee flexion - left;Decreased dorsiflexion - left;Decreased weight shift to left;Decreased trunk rotation;Wide base of support;Poor foot clearance - left;Left genu recurvatum    Ambulation Surface  Level;Indoor  Gait velocity  19.56 sec = 1.68 ft/sec      Timed Up and Go Test   TUG  Normal TUG    Normal TUG (seconds)  17.25      Neuro Re-ed    Neuro Re-ed Details   In parallel bars had pt perform standing squats x10 reps to reduce extensor tone prior to more gait; progressed to side stepping in squats x 2 reps length of bars.  Cues for continued B knee flex.  Maintained LLE on 4" step and had pt lower and elevate R LE to ground, (then repeated with RLE on step and LLE on ground) again to encourage flexion pattern.  Forward lunge step x 10 reps each leg, to encourage knee/hip flexion      Exercises   Exercises  Other Exercises    Other Exercises   In tall kneel by mat table:  tall kneel<>sit back on heels, 2 sets  x 10 reps for improved knee and hip flexion, then tall kneel side stepping, 3 reps R and L along mat, with PT assisting fully lifting LLE to clear foot; then tall kneel>1/2 kneel, PT assist to clear L foot into half-kneel.  In quadruped:  rocking neutral>posterior (hips torwards heels) x 10 reps for improved hip/knee flexion.  Tall kneel with hip kick x 5 reps each side.      Knee/Hip Exercises: Aerobic   Stepper  Level 1.5, 4 extremities, at 50-60 RPM, for hip/knee flexion    Other Aerobic  Discussed benefits of patient looking into community fitness such as [employer] or [employer] (both near his home) where he could work on machines that reinforce hip and knee flexion.             PT Education - 05/27/17 2151    Education provided  Yes    Education Details  updates to HEP; encouraged patient to seek out community fitness options    Person(s) Educated  Patient    Methods  Explanation;Demonstration;Handout    Comprehension  Verbalized understanding;Returned demonstration       PT Short Term Goals - 04/25/17 1332      PT SHORT TERM GOAL #1   Title  Pt will be independent with HEP for improved strength, flexibility and balance.  TARGET 05/09/16    Baseline  HEP initiated at 03/26/17 visit; reviewed HEP with pt return demo understanding; reports doing HEP 1-2 times per day    Time  3    Period  Weeks per recert 16/10/96    Status  Achieved      PT SHORT TERM GOAL #2   Title  Pt will perform at least 6 of 10 reps of sit<>stand transfers with equal weightbearing through bilateral lower extremities.    Baseline  with cues, pt able to perform 6 out of 10 reps sit<>stand with equal WB 04/25/17    Time  3    Period  Weeks    Status  Partially Met      PT SHORT TERM GOAL #3   Title  Pt will improve TUG score to less than or equal to 15 seconds for decreased fall risk.    Baseline  04/25/17:  16.28 sec (improved from 16.91 sec at eval), 17.59 with foot-up AFO    Time  3    Period   Weeks per recert 04/54/09    Status  Not Met        PT Long Term Goals - 05/27/17 2153  PT LONG TERM GOAL #1   Title  Pt will verbalize understanding of fall prevention in the home environment.  UPDATED TARGET 06/06/17    Baseline  At fall risk per Merrilee Jansky and TUG scores    Time  7    Period  Weeks All LTGs per recert 57/84/69    Status  New      PT LONG TERM GOAL #2   Title  Pt will improve Berg Score to at least 42/56 for decreased fall risk    Baseline  Berg score 35/56 (Scores <45/56 indicate increased fall risk)    Time  7    Period  Weeks    Status  New      PT LONG TERM GOAL #3   Title  Pt will improve gait velocity to at least 2.62 ft/sec for improved gait efficiency and safety.    Baseline  gait velocity 2.09 t/sec (classified as limited community ambulator); 1.68 ft/sec 05/27/17    Time  7    Period  Weeks    Status  Not Met      PT LONG TERM GOAL #4   Title  Pt will improve TUG score to less than or equal to 13.5 seconds for decreased fall risk.    Baseline  TUG score 16.91 sec; 17.25 sec 05/27/17    Time  7    Period  Weeks    Status  Not Met            Plan - 05/27/17 2153    Clinical Impression Statement  Continued to work on exercises to facilitate hip and knee flexion, with pt showing slight improvement in L knee flexion to initiate swing phase of gait when he slows gait pattern.  (He need repeated cues to slow gait pattern).  He has not met goal for gait velocity or TUG, but again, slight improvements noted with slowed gait pattern.  He is still awaiting AFO at orthotic appointment 06/03/17, which is out of current insurance auth.  Pt would benefit from additional physical therapy for gait training once AFO is recieved.    Rehab Potential  Fair    Clinical Impairments Affecting Rehab Potential  Length of time since initial CVA    PT Frequency  Other (comment) 1x/wk for 3 weeks, then 2x/wk for 4 weeks    PT Duration  Other (comment) see above-per recert  62/95/28    PT Treatment/Interventions  ADLs/Self Care Home Management;Gait training;Stair training;Functional mobility training;Therapeutic activities;Therapeutic exercise;Balance training;Patient/family education;Orthotic Fit/Training;Neuromuscular re-education    PT Next Visit Plan  Review HEP given this visit, check remaining LTGs; would like to request additional visits for AFO check and any additional gait training/gait recommendations; will also need recert    Consulted and Agree with Plan of Care  Patient       Patient will benefit from skilled therapeutic intervention in order to improve the following deficits and impairments:  Abnormal gait, Decreased balance, Decreased mobility, Decreased safety awareness, Difficulty walking, Decreased strength, Impaired flexibility  Visit Diagnosis: Muscle weakness (generalized)  Unsteadiness on feet  Other abnormalities of gait and mobility     Problem List Patient Active Problem List   Diagnosis Date Noted  . Spondylosis without myelopathy or radiculopathy, lumbar region 02/05/2017  . Chronic low back pain 12/05/2016  . Abnormality of gait 12/26/2015  . Spastic hemiparesis of left nondominant side ([place]) 12/26/2015  . Chronic back pain 09/28/2015  . History of stroke   . Hemianopia, homonymous, right   .  Stroke ([place]) 09/27/2015  . Acute CVA (cerebrovascular accident) ([place]) 09/27/2015  . Hyperlipidemia 07/16/2014  . Iron deficiency anemia 07/16/2014  . Prolonged Q-T interval on ECG 07/16/2014  . CVA (cerebral infarction) 07/16/2014  . Acute ischemic left PCA stroke ([place]) 07/15/2014  . CAD S/P PCI of dRCA - 2 DES (2.25 x 28 Promus Premier & 2.75 x 28 Promus Premier -- postdilated to 2.75 mm) - distal RCA branches small & diffusely diseased. 02/18/2014    Class: Status post  . History of ST elevation myocardial infarction (STEMI) 02/17/2014  . Essential hypertension 02/17/2014  . History of cerebellar hemorrhage 2006 02/17/2014  .  GERD (gastroesophageal reflux disease) 02/17/2014  . Hyperglycemia 02/17/2014  . Obesity 02/17/2014  . ST elevation myocardial infarction (STEMI) involving right coronary artery in recovery phase ([place]) 02/17/2014    Letzy Gullickson W. 05/27/2017, 9:58 PM  Frazier Butt., PT   [facility] [address] [place], [place], [address] Phone: [phone]   Fax:  [phone]  Name: QASIM DIVELEY MRN: [id number] Date of Birth: Jan 14, 1966

## 2017-05-27 NOTE — Patient Instructions (Addendum)
  Copyright  VHI. All rights reserved.  Rock Backward Hip Abduction: 4 Point    With knees and feet shoulder width apart, keep back flat (neutral). Rock backward, moving at hip not from back. Do __10_ times, 1-2_ times per day.  http://ss.exer.us/96   Copyright  VHI. All rights reserved.  HIP: Extension / KNEE: Flexion, Tall Kneeling    Kneel with buttocks touching heels. Lift chest and bring hips forward. _10_ reps per set, 2___ sets per day  Copyright  VHI. All rights reserved.  HIP: Extension / KNEE: Flexion, Tall Kneeling    Kneel with buttocks touching heels. Lift chest and bring hips forward. ___ reps per set, ___ sets per day, ___ days per week  Copyright  VHI. All rights reserved.

## 2017-05-28 LAB — DRUG TOX MONITOR 1 W/CONF, ORAL FLD
Amphetamines: NEGATIVE ng/mL (ref <10)
Barbiturates: NEGATIVE ng/mL (ref <10)
Benzodiazepines: NEGATIVE ng/mL (ref <0.50)
Buprenorphine: NEGATIVE ng/mL (ref <0.10)
COCAINE: NEGATIVE ng/mL (ref <5.0)
CODEINE: NEGATIVE ng/mL (ref <2.5)
DIHYDROCODEINE: NEGATIVE ng/mL (ref <2.5)
Fentanyl: NEGATIVE ng/mL (ref <0.10)
HEROIN METABOLITE: NEGATIVE ng/mL (ref <1.0)
Hydrocodone: NEGATIVE ng/mL (ref <2.5)
Hydromorphone: NEGATIVE ng/mL (ref <2.5)
MARIJUANA: NEGATIVE ng/mL (ref <2.5)
MDMA: NEGATIVE ng/mL (ref <10)
METHADONE: NEGATIVE ng/mL (ref <5.0)
Meprobamate: NEGATIVE ng/mL (ref <2.5)
Morphine: NEGATIVE ng/mL (ref <2.5)
NICOTINE METABOLITE: NEGATIVE ng/mL (ref <5.0)
NORHYDROCODONE: NEGATIVE ng/mL (ref <2.5)
NOROXYCODONE: 9 ng/mL — AB (ref <2.5)
OXYCODONE: 25.3 ng/mL — AB (ref <2.5)
OXYMORPHONE: NEGATIVE ng/mL (ref <2.5)
Opiates: POSITIVE ng/mL — AB (ref <2.5)
Phencyclidine: NEGATIVE ng/mL (ref <10)
TRAMADOL: NEGATIVE ng/mL (ref <5.0)
Tapentadol: NEGATIVE ng/mL (ref <5.0)
Zolpidem: NEGATIVE ng/mL (ref <5.0)

## 2017-05-28 LAB — DRUG TOX ALC METAB W/CON, ORAL FLD: Alcohol Metabolite: NEGATIVE ng/mL (ref <25)

## 2017-05-30 ENCOUNTER — Ambulatory Visit: Payer: Medicaid Other | Admitting: Rehabilitation

## 2017-05-31 ENCOUNTER — Telehealth: Payer: Self-pay | Admitting: *Deleted

## 2017-05-31 NOTE — Telephone Encounter (Signed)
Oral swab drug screen was consistent for prescribed medications.  ?

## 2017-06-03 DIAGNOSIS — M21372 Foot drop, left foot: Secondary | ICD-10-CM | POA: Diagnosis not present

## 2017-06-20 NOTE — Telephone Encounter (Signed)
Error Noted

## 2017-06-27 ENCOUNTER — Encounter: Payer: Self-pay | Admitting: Physical Therapy

## 2017-06-27 NOTE — Therapy (Signed)
[facility] [address] [place], [place], [address] Phone: [phone]   Fax:  [phone]  Patient Details  Name: George Villa MRN: [id number] Date of Birth: [birth year] Referring Provider:  No ref. provider found  Encounter Date: 06/27/2017  PHYSICAL THERAPY DISCHARGE SUMMARY  Visits from Start of Care: 7  Current functional level related to goals / functional outcomes: HEP initiated, AFO consult initiated, with orthotic appointment 06/03/17.  Pt did not return after 05/27/17 appointment for physical therapy, so PT unsure of outcome of AFO.   Remaining deficits: Balance, gait   Education / Equipment: HEP, benefit of orthotic for improved gait pattern  Plan: Patient agrees to discharge.  Patient goals were not met. Patient is being discharged due to not returning since the last visit.  ?????      Hartley Wyke W. 06/27/2017, 10:50 AM Mady Haagensen, PT 06/27/17 10:53 AM Phone: [phone] Fax: [facility] [address] [place], [place], [address] Phone: [phone]   Fax:  [phone]

## 2017-07-03 ENCOUNTER — Encounter: Payer: Medicaid Other | Attending: Physical Medicine & Rehabilitation | Admitting: Registered Nurse

## 2017-07-03 ENCOUNTER — Encounter: Payer: Self-pay | Admitting: Registered Nurse

## 2017-07-03 ENCOUNTER — Other Ambulatory Visit: Payer: Self-pay

## 2017-07-03 VITALS — BP 127/80 | HR 100 | Ht 67.0 in | Wt 200.0 lb

## 2017-07-03 DIAGNOSIS — I69314 Frontal lobe and executive function deficit following cerebral infarction: Secondary | ICD-10-CM | POA: Insufficient documentation

## 2017-07-03 DIAGNOSIS — R51 Headache: Secondary | ICD-10-CM | POA: Insufficient documentation

## 2017-07-03 DIAGNOSIS — M545 Low back pain: Secondary | ICD-10-CM | POA: Insufficient documentation

## 2017-07-03 DIAGNOSIS — I69393 Ataxia following cerebral infarction: Secondary | ICD-10-CM | POA: Insufficient documentation

## 2017-07-03 DIAGNOSIS — K219 Gastro-esophageal reflux disease without esophagitis: Secondary | ICD-10-CM | POA: Diagnosis not present

## 2017-07-03 DIAGNOSIS — I69354 Hemiplegia and hemiparesis following cerebral infarction affecting left non-dominant side: Secondary | ICD-10-CM | POA: Insufficient documentation

## 2017-07-03 DIAGNOSIS — Z5181 Encounter for therapeutic drug level monitoring: Secondary | ICD-10-CM

## 2017-07-03 DIAGNOSIS — G8114 Spastic hemiplegia affecting left nondominant side: Secondary | ICD-10-CM | POA: Diagnosis not present

## 2017-07-03 DIAGNOSIS — G894 Chronic pain syndrome: Secondary | ICD-10-CM

## 2017-07-03 DIAGNOSIS — G8929 Other chronic pain: Secondary | ICD-10-CM | POA: Insufficient documentation

## 2017-07-03 DIAGNOSIS — M47816 Spondylosis without myelopathy or radiculopathy, lumbar region: Secondary | ICD-10-CM | POA: Diagnosis not present

## 2017-07-03 DIAGNOSIS — Z79899 Other long term (current) drug therapy: Secondary | ICD-10-CM | POA: Diagnosis not present

## 2017-07-03 DIAGNOSIS — I10 Essential (primary) hypertension: Secondary | ICD-10-CM | POA: Diagnosis not present

## 2017-07-03 NOTE — Progress Notes (Signed)
Subjective:    Patient ID: George Villa, male    DOB: 16-Jul-1965, 52 y.o.   MRN: [id number]  HPI: George Villa is a 52 year old male who returns for follow up appointment and medication refill. He states his pain is located in his lower back. He rates his pain 7. His current exercise regime is walking  [name] Morphine equivalent is 68.00MME.  Last  Oral Swab was  Performed on 05/24/2017 it was consistent.  Pain Inventory Average Pain 7 Pain Right Now 7 My pain is dull  In the last 24 hours, has pain interfered with the following? General activity 5 Relation with others 6 Enjoyment of life 7 What TIME of day is your pain at its worst? night Sleep (in general) Fair  Pain is worse with: walking, sitting and standing Pain improves with: medication Relief from Meds: 8  Mobility use a cane ability to climb steps?  yes do you drive?  yes  Function [id number]/10  Neuro/Psych trouble walking  Prior Studies Any changes since last visit?  no  Physicians involved in your care Any changes since last visit?  no   Family History  Problem Relation Age of Onset  . Heart Problems Father   . Hypertension Mother   . Stroke Unknown    Social History   Socioeconomic History  . Marital status: Single    Spouse name: Not on file  . Number of children: Not on file  . Years of education: Not on file  . Highest education level: Not on file  Occupational History  . Occupation: umemployed     Comment: Works in [employer]   Social Needs  . Financial resource strain: Not on file  . Food insecurity:    Worry: Not on file    Inability: Not on file  . Transportation needs:    Medical: Not on file    Non-medical: Not on file  Tobacco Use  . Smoking status: Never Smoker  . Smokeless tobacco: Never Used  Substance and Sexual Activity  . Alcohol use: Yes    Alcohol/week: 3.6 oz    Types: 6 Cans of beer per week    Comment: OCCASSIONALLY   . Drug use: No  . Sexual  activity: Yes  Lifestyle  . Physical activity:    Days per week: Not on file    Minutes per session: Not on file  . Stress: Not on file  Relationships  . Social connections:    Talks on phone: Not on file    Gets together: Not on file    Attends religious service: Not on file    Active member of club or organization: Not on file    Attends meetings of clubs or organizations: Not on file    Relationship status: Not on file  Other Topics Concern  . Not on file  Social History Narrative   7 cups of caffeine a week    Past Surgical History:  Procedure Laterality Date  . BACK SURGERY    . BRAIN HEMATOMA EVACUATION  2006  . LEFT HEART CATHETERIZATION WITH CORONARY ANGIOGRAM N/A 02/17/2014   Procedure: LEFT HEART CATHETERIZATION WITH CORONARY ANGIOGRAM;  Surgeon: Lesleigh NoeHenry W Molitor III, MD;  [facility];  Service: Cardiovascular;  Laterality: N/A;  . LUMBAR [place] SURGERY  2013  . PERCUTANEOUS CORONARY STENT INTERVENTION (PCI-S)  02/17/2014   Procedure: PERCUTANEOUS CORONARY STENT INTERVENTION (PCI-S);  Surgeon: Lesleigh NoeHenry W Rickert III, MD;  [facility];  Service: Cardiovascular;;  . TRACHEOSTOMY  05/2004  . VENTRICULOSTOMY  2006   Past Medical History:  Diagnosis Date  . Acid reflux   . 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  . Chronic lower back pain   . Headache    "monthly" (02/18/2014)  . Hypertension   . 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 Stroke [facility]) 2006   "left leg weak since" (02/18/2014)   There were no vitals taken for this visit.  Opioid Risk Score:  0 Fall Risk Score:  `1  Depression screen PHQ 2/9  Depression screen [facility] 2/9 05/24/2017 04/24/2017 09/28/2015  Decreased Interest 0 0 0  Down, Depressed, Hopeless 0 0 0  PHQ - 2 Score 0 0 0    Review of Systems  Constitutional: Negative.   HENT: Negative.   Eyes: Negative.   Respiratory: Negative.   Cardiovascular:  Negative.   Gastrointestinal: Negative.   Endocrine: Negative.   Genitourinary: Negative.   Musculoskeletal: Positive for gait problem.  Skin: Negative.   Allergic/Immunologic: Negative.   Hematological: Negative.   Psychiatric/Behavioral: Positive for sleep disturbance.  All other systems reviewed and are negative.      Objective:   Physical Exam  Constitutional: He is oriented to person, place, and time. He appears well-developed and well-nourished.  HENT:  Head: Normocephalic and atraumatic.  Neck: Normal range of motion. Neck supple.  Cardiovascular: Normal rate and regular rhythm.  Pulmonary/Chest: Effort normal and breath sounds normal.  Musculoskeletal:  Normal Muscle Bulk and Muscle Testing Reveals: Upper Extremities: Right: Full ROM and Muscle Strength 5/5 Left: Full ROM and Muscle Strength 4/5  Back without spinal tenderness noted Lower Extremities: Full ROM and Muscle Strength on the Right  5/5 and Left 4/5 Wearing Left AFO Arises from Table Slowly using straight cane for support Antalgic Gait  Neurological: He is alert and oriented to person, place, and time.  Skin: Skin is warm and dry.  Psychiatric: He has a normal mood and affect.  Nursing note and vitals reviewed.         Assessment & Plan:  1. History of multiple strokes with gait disorder, ongoing ataxia andspasticleft hemiparesis: Continue  HEP as Tolerated. Reports he has discontinued his Baclofen. 07/03/2017 2. Chronic low back pain with documented facet arthropathy and history of intradural defect/schwanomma requiring resection, per Dr. Riley Kill Note.  Refilled:Percocet10/325 one q6 prn #120. 07/03/2017 We will continue the opioid monitoring program, this consists of regular clinic visits, examinations, routine drug screening, pill counts as well as use of [place] Controlled Substance Reporting System. [place] was [place]  20 minutes of face to face patient care time was spent during  this visit. All questions were encouraged and answered.  F/U in 1 month

## 2017-07-05 ENCOUNTER — Telehealth: Payer: Self-pay | Admitting: Physical Medicine & Rehabilitation

## 2017-07-05 DIAGNOSIS — M47816 Spondylosis without myelopathy or radiculopathy, lumbar region: Secondary | ICD-10-CM

## 2017-07-05 MED ORDER — OXYCODONE-ACETAMINOPHEN 10-325 MG PO TABS
1.0000 | ORAL_TABLET | Freq: Four times a day (QID) | ORAL | 0 refills | Status: DC | PRN
Start: 2017-07-05 — End: 2017-07-30

## 2017-07-05 NOTE — Telephone Encounter (Signed)
Placed a call to [facility], they never received the Oxycodone prescription. Oxycodone e-scribed. Placed a call to [name] no answer, left message.

## 2017-07-05 NOTE — Telephone Encounter (Signed)
Patient saw Riley Lamunice on Wednesday and his prescription for oxycodone wasn't at his pharmacy.  Please call patient if this can't be sent to his pharmacy.

## 2017-07-30 ENCOUNTER — Encounter: Payer: Medicaid Other | Attending: Physical Medicine & Rehabilitation | Admitting: Registered Nurse

## 2017-07-30 ENCOUNTER — Encounter: Payer: Self-pay | Admitting: Registered Nurse

## 2017-07-30 VITALS — BP 125/88 | HR 90 | Ht 67.0 in | Wt 198.0 lb

## 2017-07-30 DIAGNOSIS — K219 Gastro-esophageal reflux disease without esophagitis: Secondary | ICD-10-CM | POA: Diagnosis not present

## 2017-07-30 DIAGNOSIS — G8114 Spastic hemiplegia affecting left nondominant side: Secondary | ICD-10-CM | POA: Diagnosis not present

## 2017-07-30 DIAGNOSIS — I69354 Hemiplegia and hemiparesis following cerebral infarction affecting left non-dominant side: Secondary | ICD-10-CM | POA: Diagnosis present

## 2017-07-30 DIAGNOSIS — G8929 Other chronic pain: Secondary | ICD-10-CM | POA: Insufficient documentation

## 2017-07-30 DIAGNOSIS — I69314 Frontal lobe and executive function deficit following cerebral infarction: Secondary | ICD-10-CM | POA: Insufficient documentation

## 2017-07-30 DIAGNOSIS — I69393 Ataxia following cerebral infarction: Secondary | ICD-10-CM | POA: Diagnosis present

## 2017-07-30 DIAGNOSIS — Z5181 Encounter for therapeutic drug level monitoring: Secondary | ICD-10-CM | POA: Diagnosis not present

## 2017-07-30 DIAGNOSIS — M47816 Spondylosis without myelopathy or radiculopathy, lumbar region: Secondary | ICD-10-CM | POA: Diagnosis not present

## 2017-07-30 DIAGNOSIS — R51 Headache: Secondary | ICD-10-CM | POA: Insufficient documentation

## 2017-07-30 DIAGNOSIS — M545 Low back pain: Secondary | ICD-10-CM | POA: Diagnosis not present

## 2017-07-30 DIAGNOSIS — G894 Chronic pain syndrome: Secondary | ICD-10-CM | POA: Diagnosis not present

## 2017-07-30 DIAGNOSIS — I10 Essential (primary) hypertension: Secondary | ICD-10-CM | POA: Insufficient documentation

## 2017-07-30 DIAGNOSIS — Z79899 Other long term (current) drug therapy: Secondary | ICD-10-CM

## 2017-07-30 MED ORDER — OXYCODONE-ACETAMINOPHEN 10-325 MG PO TABS: 1.0000 | ORAL_TABLET | Freq: Four times a day (QID) | ORAL | 0 refills | Status: DC | PRN

## 2017-07-30 NOTE — Progress Notes (Signed)
Subjective:    Patient ID: George Villa, male    DOB: 05-12-1965, 52 y.o.   MRN: [id number]  HPI: George Villa is a 52 year old male who returns for follow up appointment for chronic pain and medication refill. He states his pain is located in his lower back. He rates his pain 6. His current exercise regime is walking.   George Villa is 66.00 MME.   Last Oral Swab was Performed on 05/24/2017 it was consistent.   Pain Inventory Average Pain 6 Pain Right Now 6 My pain is dull  In the last 24 hours, has pain interfered with the following? General activity 6 Relation with others 6 Enjoyment of life 7 What TIME of day is your pain at its worst? night Sleep (in general) Fair  Pain is worse with: walking and standing Pain improves with: . Relief from Meds: 5  Mobility walk without assistance use a cane ability to climb steps?  yes do you drive?  yes  Function disabled: date disabled .  Neuro/Psych No problems in this area  Prior Studies Any changes since last visit?  no  Physicians involved in your care Any changes since last visit?  no   Family History  Problem Relation Age of Onset  . Heart Problems Father   . Hypertension Mother   . Stroke Unknown    Social History   Socioeconomic History  . Marital status: Single    Spouse name: Not on file  . Number of children: Not on file  . Years of education: Not on file  . Highest education level: Not on file  Occupational History  . Occupation: umemployed     Comment: Works in [employer]   Social Needs  . Financial resource strain: Not on file  . Food insecurity:    Worry: Not on file    Inability: Not on file  . Transportation needs:    Medical: Not on file    Non-medical: Not on file  Tobacco Use  . Smoking status: Never Smoker  . Smokeless tobacco: Never Used  Substance and Sexual Activity  . Alcohol use: Yes    Alcohol/week: 3.6 oz    Types: 6 Cans of beer per week   Comment: OCCASSIONALLY   . Drug use: No  . Sexual activity: Yes  Lifestyle  . Physical activity:    Days per week: Not on file    Minutes per session: Not on file  . Stress: Not on file  Relationships  . Social connections:    Talks on phone: Not on file    Gets together: Not on file    Attends religious service: Not on file    Active member of club or organization: Not on file    Attends meetings of clubs or organizations: Not on file    Relationship status: Not on file  Other Topics Concern  . Not on file  Social History Narrative   7 cups of caffeine a week    Past Surgical History:  Procedure Laterality Date  . BACK SURGERY    . BRAIN HEMATOMA EVACUATION  2006  . LEFT HEART CATHETERIZATION WITH CORONARY ANGIOGRAM N/A 02/17/2014   Procedure: LEFT HEART CATHETERIZATION WITH CORONARY ANGIOGRAM;  Surgeon: Lesleigh NoeHenry W Delker III, MD;  [facility];  Service: Cardiovascular;  Laterality: N/A;  . LUMBAR DISC SURGERY  2013  . PERCUTANEOUS CORONARY STENT INTERVENTION (PCI-S)  02/17/2014   Procedure: PERCUTANEOUS CORONARY STENT INTERVENTION (  PCI-S);  Surgeon: [name], MD;  Location: [facility] C[facility] LAB;  Service: Cardiovascular;;  . TRACHEOSTOMY  05/2004  . VENTRICULOSTOMY  2006   Past Medical History:  Diagnosis Date  . Acid reflux   . 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  . Chronic lower back pain   . Headache    "monthly" (02/18/2014)  . Hypertension   . 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 Stroke [facility]) 2006   "left leg weak since" (02/18/2014)   There were no vitals taken for this visit.  Opioid Risk Score:   Fall Risk Score:  `1  Depression screen PHQ 2/9  Depression screen [facility] 2/9 05/24/2017 04/24/2017 09/28/2015  Decreased Interest 0 0 0  Down, Depressed, Hopeless 0 0 0  PHQ - 2 Score 0 0 0     Review of Systems  Constitutional: Negative.   HENT: Negative.   Eyes:  Negative.   Respiratory: Negative.   Cardiovascular: Negative.   Gastrointestinal: Negative.   Endocrine: Negative.   Genitourinary: Negative.   Musculoskeletal: Positive for arthralgias, back pain, gait problem, myalgias and neck pain.  Skin: Negative.   Allergic/Immunologic: Negative.   Hematological: Negative.   Psychiatric/Behavioral: Negative.   All other systems reviewed and are negative.      Objective:   Physical Exam  Constitutional: He is oriented to person, place, and time. He appears well-developed and well-nourished.  HENT:  Head: Normocephalic and atraumatic.  Neck: Normal range of motion. Neck supple.  Cardiovascular: Normal rate and regular rhythm.  Pulmonary/Chest: Effort normal and breath sounds normal.  Musculoskeletal:  Normal Muscle Bulk and Muscle Testing Reveals: Upper Extremities: Full ROM and Muscle Strength 5/5 Back without spinal Tenderness noted Lower Extremities: Full ROM and Muscle Strength 5/5 Arises from Table Slowly using cane for support Wearing Left AFO Antalgic  Gait   Neurological: He is alert and oriented to person, place, and time.  Skin: Skin is warm and dry.  Psychiatric: He has a normal mood and affect.  Nursing note and vitals reviewed.         Assessment & Plan:  1. History of multiple strokes with gait disorder, ongoing ataxia andspasticleft hemiparesis: Continue  HEP as Tolerated. 07/31/2017 2. Chronic low back pain with documented facet arthropathy and history of intradural defect/schwanomma requiring resection, per Dr. Riley Kill Note. Continue current medication regimen. Refilled:Percocet10/325 one q6 prn #120. 07/31/2017 We will continue the opioid monitoring program, this consists of regular clinic visits, examinations, routine drug screening, pill counts as well as use of [place] Controlled Substance Reporting System. NCCSRS was reviewed  20 minutes of face to face patient care time was spent during this  visit. All questions were encouraged and answered.  F/U in 1 month

## 2017-08-05 ENCOUNTER — Encounter: Payer: Medicaid Other | Admitting: Registered Nurse

## 2017-08-28 ENCOUNTER — Encounter: Payer: Self-pay | Admitting: Registered Nurse

## 2017-08-28 ENCOUNTER — Encounter: Payer: Medicaid Other | Attending: Physical Medicine & Rehabilitation | Admitting: Registered Nurse

## 2017-08-28 ENCOUNTER — Other Ambulatory Visit: Payer: Self-pay

## 2017-08-28 VITALS — BP 112/79 | HR 89 | Ht 67.0 in | Wt 197.0 lb

## 2017-08-28 DIAGNOSIS — R51 Headache: Secondary | ICD-10-CM | POA: Insufficient documentation

## 2017-08-28 DIAGNOSIS — I69314 Frontal lobe and executive function deficit following cerebral infarction: Secondary | ICD-10-CM | POA: Diagnosis not present

## 2017-08-28 DIAGNOSIS — M47816 Spondylosis without myelopathy or radiculopathy, lumbar region: Secondary | ICD-10-CM

## 2017-08-28 DIAGNOSIS — Z79899 Other long term (current) drug therapy: Secondary | ICD-10-CM

## 2017-08-28 DIAGNOSIS — I10 Essential (primary) hypertension: Secondary | ICD-10-CM | POA: Insufficient documentation

## 2017-08-28 DIAGNOSIS — G8929 Other chronic pain: Secondary | ICD-10-CM | POA: Insufficient documentation

## 2017-08-28 DIAGNOSIS — G894 Chronic pain syndrome: Secondary | ICD-10-CM | POA: Diagnosis not present

## 2017-08-28 DIAGNOSIS — K219 Gastro-esophageal reflux disease without esophagitis: Secondary | ICD-10-CM | POA: Insufficient documentation

## 2017-08-28 DIAGNOSIS — Z5181 Encounter for therapeutic drug level monitoring: Secondary | ICD-10-CM | POA: Diagnosis not present

## 2017-08-28 DIAGNOSIS — M545 Low back pain: Secondary | ICD-10-CM | POA: Diagnosis not present

## 2017-08-28 DIAGNOSIS — G8114 Spastic hemiplegia affecting left nondominant side: Secondary | ICD-10-CM

## 2017-08-28 DIAGNOSIS — I69393 Ataxia following cerebral infarction: Secondary | ICD-10-CM | POA: Diagnosis present

## 2017-08-28 DIAGNOSIS — I63532 Cerebral infarction due to unspecified occlusion or stenosis of left posterior cerebral artery: Secondary | ICD-10-CM | POA: Diagnosis not present

## 2017-08-28 DIAGNOSIS — I69354 Hemiplegia and hemiparesis following cerebral infarction affecting left non-dominant side: Secondary | ICD-10-CM | POA: Insufficient documentation

## 2017-08-28 MED ORDER — OXYCODONE-ACETAMINOPHEN 10-325 MG PO TABS: 1.0000 | ORAL_TABLET | Freq: Four times a day (QID) | ORAL | 0 refills | Status: DC | PRN

## 2017-08-28 NOTE — Progress Notes (Signed)
Subjective:    Patient ID: George Villa, male    DOB: 1965/05/07, 52 y.o.   MRN: [id number]  HPI: George Villa is a 52 year old male who is here for follow up appointment for chronic pain and medication refill. He states his pain is located in his lower back. He rates his pain 6. His current exercise regime is walking and performing stretching exercises.   George Villa Morphine equivalent is 60.00 MME.  Last Oral Swab was Performed on 05/24/2017, it was consistent.   Pain Inventory Average Pain 6 Pain Right Now 6 My pain is dull  In the last 24 hours, has pain interfered with the following? General activity 6 Relation with others 6 Enjoyment of life 6 What TIME of day is your pain at its worst? night Sleep (in general) Fair  Pain is worse with: walking and bending Pain improves with: medication Relief from Meds: 6  Mobility walk without assistance use a cane ability to climb steps?  yes do you drive?  yes  Function disabled: date disabled n/a Do you have any goals in this area?  yes  Neuro/Psych bladder control problems  Prior Studies Any changes since last visit?  no  Physicians involved in your care Any changes since last visit?  no   Family History  Problem Relation Age of Onset  . Heart Problems Father   . Hypertension Mother   . Stroke Unknown    Social History   Socioeconomic History  . Marital status: Single    Spouse name: Not on file  . Number of children: Not on file  . Years of education: Not on file  . Highest education level: Not on file  Occupational History  . Occupation: umemployed     Comment: Works in [employer]  . Financial resource strain: Not on file  . Food insecurity:    Worry: Not on file    Inability: Not on file  . Transportation needs:    Medical: Not on file    Non-medical: Not on file  Tobacco Use  . Smoking status: Never Smoker  . Smokeless tobacco: Never Used  Substance and Sexual Activity    . Alcohol use: Yes    Alcohol/week: 3.6 oz    Types: 6 Cans of beer per week    Comment: OCCASSIONALLY   . Drug use: No  . Sexual activity: Yes  Lifestyle  . Physical activity:    Days per week: Not on file    Minutes per session: Not on file  . Stress: Not on file  Relationships  . Social connections:    Talks on phone: Not on file    Gets together: Not on file    Attends religious service: Not on file    Active member of club or organization: Not on file    Attends meetings of clubs or organizations: Not on file    Relationship status: Not on file  Other Topics Concern  . Not on file  Social History Narrative   7 cups of caffeine a week    Past Surgical History:  Procedure Laterality Date  . BACK SURGERY    . BRAIN HEMATOMA EVACUATION  2006  . LEFT HEART CATHETERIZATION WITH CORONARY ANGIOGRAM N/A 02/17/2014   Procedure: LEFT HEART CATHETERIZATION WITH CORONARY ANGIOGRAM;  Surgeon: [name], MD;  Location: [facility] CATH LAB;  Service: Cardiovascular;  Laterality: N/A;  . LUMBAR DISC SURGERY  2013  .  PERCUTANEOUS CORONARY STENT INTERVENTION (PCI-S)  02/17/2014   Procedure: PERCUTANEOUS CORONARY STENT INTERVENTION (PCI-S);  Surgeon: [name], MD;  Location: [facility] CATH LAB;  Service: Cardiovascular;;  . TRACHEOSTOMY  05/2004  . VENTRICULOSTOMY  2006   Past Medical History:  Diagnosis Date  . Acid reflux   . 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  . Chronic lower back pain   . Headache    "monthly" (02/18/2014)  . Hypertension   . 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 Stroke [facility]) 2006   "left leg weak since" (02/18/2014)   BP 112/79   Pulse 89   Ht  (1.702 m)   Wt 197 lb (89.4 kg)   SpO2 95%   BMI 30.85 kg/m   Opioid Risk Score:   Fall Risk Score:  `1  Depression screen PHQ 2/9  Depression screen [facility] 2/9 08/28/2017 05/24/2017 04/24/2017 09/28/2015  Decreased  Interest 0 0 0 0  Down, Depressed, Hopeless 0 0 0 0  PHQ - 2 Score 0 0 0 0   Review of Systems  Constitutional: Negative.   HENT: Negative.   Eyes: Negative.   Respiratory: Negative.   Cardiovascular: Negative.   Gastrointestinal: Negative.   Endocrine: Negative.   Genitourinary: Negative.   Musculoskeletal: Negative.   Skin: Negative.   Allergic/Immunologic: Negative.   Neurological: Negative.   Hematological: Negative.   Psychiatric/Behavioral: Negative.        Objective:   Physical Exam  Constitutional: He is oriented to person, place, and time. He appears well-developed and well-nourished.  HENT:  Head: Normocephalic and atraumatic.  Neck: Normal range of motion. Neck supple.  Cardiovascular: Normal rate and regular rhythm.  Pulmonary/Chest: Effort normal and breath sounds normal.  Musculoskeletal:  Normal Muscle Bulk and Muscle Testing Reveals: Upper Extremities: Full ROM and Muscle Strength 5/5 Lumbar Paraspinal Tenderness: L-4-L-5 Lower Extremities: Full ROM and Muscle Strength 5/5 Arises from Table Slowly using cane for support Antalgic gait  Neurological: He is alert and oriented to person, place, and time.  Skin: Skin is warm and dry.  Psychiatric: He has a normal mood and affect.  Nursing note and vitals reviewed.         Assessment & Plan:  1. History of multiple strokes with gait disorder, ongoing ataxia andspasticleft hemiparesis: ContinueHEP as Tolerated. 08/28/2017 2. Chronic low back pain with documented facet arthropathy and history of intradural defect/schwanomma requiring resection, per Dr. Riley Kill Note. Continue current medication regimen. Refilled:Percocet10/325 one q6 prn #120.08/28/2017 We will continue the opioid monitoring program, this consists of regular clinic visits, examinations, routine drug screening, pill counts as well as use of [place] Controlled Substance Reporting System. NCCSRS was reviewed  20 minutes of  face to face patient care time was spent during this visit. All questions were encouraged and answered.  F/U in 1 month

## 2017-08-29 ENCOUNTER — Telehealth: Payer: Self-pay | Admitting: *Deleted

## 2017-08-29 NOTE — Telephone Encounter (Signed)
Approved, pharmacy notified, attempted to notify patient, left message

## 2017-08-29 NOTE — Telephone Encounter (Signed)
Prior Authorization submitted to [facility] [employer] for oxycodone-acetaminophen 10-325mg . Awaiting response

## 2017-09-25 ENCOUNTER — Encounter: Payer: Medicaid Other | Attending: Physical Medicine & Rehabilitation | Admitting: Registered Nurse

## 2017-09-25 ENCOUNTER — Encounter: Payer: Self-pay | Admitting: Registered Nurse

## 2017-09-25 VITALS — BP 118/76 | HR 84 | Ht 67.0 in | Wt 199.0 lb

## 2017-09-25 DIAGNOSIS — I69393 Ataxia following cerebral infarction: Secondary | ICD-10-CM | POA: Insufficient documentation

## 2017-09-25 DIAGNOSIS — G8114 Spastic hemiplegia affecting left nondominant side: Secondary | ICD-10-CM

## 2017-09-25 DIAGNOSIS — M47816 Spondylosis without myelopathy or radiculopathy, lumbar region: Secondary | ICD-10-CM

## 2017-09-25 DIAGNOSIS — I69314 Frontal lobe and executive function deficit following cerebral infarction: Secondary | ICD-10-CM | POA: Diagnosis not present

## 2017-09-25 DIAGNOSIS — G8929 Other chronic pain: Secondary | ICD-10-CM | POA: Diagnosis not present

## 2017-09-25 DIAGNOSIS — I69354 Hemiplegia and hemiparesis following cerebral infarction affecting left non-dominant side: Secondary | ICD-10-CM | POA: Diagnosis present

## 2017-09-25 DIAGNOSIS — K219 Gastro-esophageal reflux disease without esophagitis: Secondary | ICD-10-CM | POA: Diagnosis not present

## 2017-09-25 DIAGNOSIS — R51 Headache: Secondary | ICD-10-CM | POA: Diagnosis not present

## 2017-09-25 DIAGNOSIS — I10 Essential (primary) hypertension: Secondary | ICD-10-CM | POA: Insufficient documentation

## 2017-09-25 DIAGNOSIS — Z79899 Other long term (current) drug therapy: Secondary | ICD-10-CM

## 2017-09-25 DIAGNOSIS — Z5181 Encounter for therapeutic drug level monitoring: Secondary | ICD-10-CM | POA: Diagnosis not present

## 2017-09-25 DIAGNOSIS — M545 Low back pain: Secondary | ICD-10-CM | POA: Diagnosis not present

## 2017-09-25 MED ORDER — OXYCODONE-ACETAMINOPHEN 10-325 MG PO TABS: 1.0000 | ORAL_TABLET | Freq: Four times a day (QID) | ORAL | 0 refills | Status: DC | PRN

## 2017-09-25 NOTE — Progress Notes (Signed)
Subjective:    Patient ID: George Villa, male    DOB: May 24, 1965, 52 y.o.   MRN: [id number]  HPI: [name] is a 52 year old male who returns for follow up appointment for chronic pain and medication refill. He states his pain is located in his lower back. He rates his pain 6. His current exercise regime is walking and performing stretching exercises.   [name] is 60.00 MME. Last Oral Swab was Performed on 05/24/2017, it was consistent.   Pain Inventory Average Pain 7 Pain Right Now 6 My pain is dull  In the last 24 hours, has pain interfered with the following? General activity 6 Relation with others 6 Enjoyment of life 6 What TIME of day is your pain at its worst? night Sleep (in general) Fair  Pain is worse with: walking and standing Pain improves with: medication Relief from Meds: 6  Mobility walk with assistance use a cane  Function not employed: date last employed .  Neuro/Psych depression  Prior Studies Any changes since last visit?  no  Physicians involved in your care Any changes since last visit?  no   Family History  Problem Relation Age of Onset  . Heart Problems Father   . Hypertension Mother   . Stroke Unknown    Social History   Socioeconomic History  . Marital status: Single    Spouse name: Not on file  . Number of children: Not on file  . Years of education: Not on file  . Highest education level: Not on file  Occupational History  . Occupation: umemployed     Comment: Works in [employer]   Social Needs  . Financial resource strain: Not on file  . Food insecurity:    Worry: Not on file    Inability: Not on file  . Transportation needs:    Medical: Not on file    Non-medical: Not on file  Tobacco Use  . Smoking status: Never Smoker  . Smokeless tobacco: Never Used  Substance and Sexual Activity  . Alcohol use: Yes    Alcohol/week: 3.6 oz    Types: 6 Cans of beer per week    Comment:  OCCASSIONALLY   . Drug use: No  . Sexual activity: Yes  Lifestyle  . Physical activity:    Days per week: Not on file    Minutes per session: Not on file  . Stress: Not on file  Relationships  . Social connections:    Talks on phone: Not on file    Gets together: Not on file    Attends religious service: Not on file    Active member of club or organization: Not on file    Attends meetings of clubs or organizations: Not on file    Relationship status: Not on file  Other Topics Concern  . Not on file  Social History Narrative   7 cups of caffeine a week    Past Surgical History:  Procedure Laterality Date  . BACK SURGERY    . BRAIN HEMATOMA EVACUATION  2006  . LEFT HEART CATHETERIZATION WITH CORONARY ANGIOGRAM N/A 02/17/2014   Procedure: LEFT HEART CATHETERIZATION WITH CORONARY ANGIOGRAM;  Surgeon: Lesleigh NoeHenry W Fiebelkorn III, MD;  [facility];  Service: Cardiovascular;  Laterality: N/A;  . LUMBAR DISC SURGERY  2013  . PERCUTANEOUS CORONARY STENT INTERVENTION (PCI-S)  02/17/2014   Procedure: PERCUTANEOUS CORONARY STENT INTERVENTION (PCI-S);  Surgeon: Lesleigh NoeHenry W Sanzo III, MD;  Location: [facility] CATH LAB;  Service: Cardiovascular;;  . TRACHEOSTOMY  05/2004  . VENTRICULOSTOMY  2006   Past Medical History:  Diagnosis Date  . Acid reflux   . 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  . Chronic lower back pain   . Headache    "monthly" (02/18/2014)  . Hypertension   . 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 Stroke [facility]) 2006   "left leg weak since" (02/18/2014)   BP 118/76   Pulse 84   Ht 5\' 7"  (1.702 m)   Wt 199 lb (90.3 kg)   SpO2 97%   BMI 31.17 kg/m   Opioid Risk Score:   Fall Risk Score:  `1  Depression screen PHQ 2/9  Depression screen [facility] 2/9 08/28/2017 05/24/2017 04/24/2017 09/28/2015  Decreased Interest 0 0 0 0  Down, Depressed, Hopeless 0 0 0 0  PHQ - 2 Score 0 0 0 0     Review of  Systems  Constitutional: Negative.   HENT: Negative.   Eyes: Negative.   Respiratory: Negative.   Cardiovascular: Negative.   Gastrointestinal: Negative.   Endocrine: Negative.   Genitourinary: Positive for difficulty urinating.  Musculoskeletal: Positive for arthralgias, back pain, gait problem and myalgias.  Skin: Negative.   Allergic/Immunologic: Negative.   Hematological: Negative.   Psychiatric/Behavioral: Positive for dysphoric mood.  All other systems reviewed and are negative.      Objective:   Physical Exam  Constitutional: He is oriented to person, place, and time. He appears well-developed and well-nourished.  HENT:  Head: Normocephalic and atraumatic.  Neck: Normal range of motion. Neck supple.  Cardiovascular: Normal rate and regular rhythm.  Pulmonary/Chest: Effort normal and breath sounds normal.  Musculoskeletal:  Normal Muscle Bulk and Muscle Testing Reveals: Upper Extremities: Full ROM and Muscle Strength 5/5 Back without Spinal Tenderness Noted Lower Extremities: Right:Full ROM and Muscle Strength 5/5 Left: Decreased ROM and Muscle Strength 4/5 Arises from chair slowly using cane for support Antalgic gait   Neurological: He is alert and oriented to person, place, and time.  Skin: Skin is warm and dry.  Psychiatric: He has a normal mood and affect.  Nursing note and vitals reviewed.         Assessment & Plan:  1. History of multiple strokes with gait disorder, ongoing ataxia andspasticleft hemiparesis: ContinueHEP as Tolerated. 09/25/2017 2. Chronic low back pain with documented facet arthropathy and history of intradural defect/schwanomma requiring resection, per Dr. Riley Kill Note. Continue current medication regimen. Refilled:Percocet10/325 one q6 prn #120.09/25/2017 We will continue the opioid monitoring program, this consists of regular clinic visits, examinations, routine drug screening, pill counts as well as use of [place]  Controlled Substance Reporting System. NCCSRS was reviewed  20 minutes of face to face patient care time was spent during this visit. All questions were encouraged and answered.  F/U in 1 month

## 2017-10-25 ENCOUNTER — Encounter: Payer: Self-pay | Admitting: Registered Nurse

## 2017-10-25 ENCOUNTER — Encounter: Payer: Medicaid Other | Attending: Physical Medicine & Rehabilitation | Admitting: Registered Nurse

## 2017-10-25 VITALS — BP 129/89 | HR 82 | Ht 67.0 in | Wt 195.8 lb

## 2017-10-25 DIAGNOSIS — K219 Gastro-esophageal reflux disease without esophagitis: Secondary | ICD-10-CM | POA: Insufficient documentation

## 2017-10-25 DIAGNOSIS — G8929 Other chronic pain: Secondary | ICD-10-CM | POA: Insufficient documentation

## 2017-10-25 DIAGNOSIS — M47816 Spondylosis without myelopathy or radiculopathy, lumbar region: Secondary | ICD-10-CM | POA: Diagnosis not present

## 2017-10-25 DIAGNOSIS — Z5181 Encounter for therapeutic drug level monitoring: Secondary | ICD-10-CM

## 2017-10-25 DIAGNOSIS — Z79899 Other long term (current) drug therapy: Secondary | ICD-10-CM | POA: Diagnosis not present

## 2017-10-25 DIAGNOSIS — M545 Low back pain: Secondary | ICD-10-CM | POA: Insufficient documentation

## 2017-10-25 DIAGNOSIS — G8114 Spastic hemiplegia affecting left nondominant side: Secondary | ICD-10-CM | POA: Diagnosis not present

## 2017-10-25 DIAGNOSIS — I69393 Ataxia following cerebral infarction: Secondary | ICD-10-CM | POA: Insufficient documentation

## 2017-10-25 DIAGNOSIS — I69314 Frontal lobe and executive function deficit following cerebral infarction: Secondary | ICD-10-CM | POA: Insufficient documentation

## 2017-10-25 DIAGNOSIS — R51 Headache: Secondary | ICD-10-CM | POA: Diagnosis not present

## 2017-10-25 DIAGNOSIS — G894 Chronic pain syndrome: Secondary | ICD-10-CM | POA: Diagnosis not present

## 2017-10-25 DIAGNOSIS — I10 Essential (primary) hypertension: Secondary | ICD-10-CM | POA: Insufficient documentation

## 2017-10-25 DIAGNOSIS — I69354 Hemiplegia and hemiparesis following cerebral infarction affecting left non-dominant side: Secondary | ICD-10-CM | POA: Diagnosis not present

## 2017-10-25 MED ORDER — OXYCODONE-ACETAMINOPHEN 10-325 MG PO TABS
1.0000 | ORAL_TABLET | Freq: Four times a day (QID) | ORAL | 0 refills | Status: DC | PRN
Start: 2017-10-25 — End: 2017-11-25

## 2017-10-25 MED ORDER — OXYCODONE-ACETAMINOPHEN 10-325 MG PO TABS: 1.0000 | ORAL_TABLET | Freq: Four times a day (QID) | ORAL | 0 refills | Status: DC | PRN

## 2017-10-25 NOTE — Progress Notes (Signed)
Subjective:    Patient ID: George Villa, male    DOB: 05/03/1965, 52 y.o.   MRN: [id number]  HPI: M and medication refill. s. George Villa is a 52 year old male who returns for follow up appointment for chronic pain and medication refill. He states his pain is located in his lower back. He rates his pain 7. His current exercise regime is walking.   George Villa Morphine equivalent is 60.00 MME. Last Oral Swab was Performed on 05/24/2017, it was consistent.   Pain Inventory Average Pain 7 Pain Right Now 7 My pain is dull  In the last 24 hours, has pain interfered with the following? General activity 6 Relation with others 6 Enjoyment of life 6 What TIME of day is your pain at its worst? night Sleep (in general) Fair  Pain is worse with: walking and standing Pain improves with: medication Relief from Meds: 5  Mobility walk with assistance use a cane ability to climb steps?  yes  Function disabled: date disabled .  Neuro/Psych dizziness  Prior Studies Any changes since last visit?  no  Physicians involved in your care Any changes since last visit?  no   Family History  Problem Relation Age of Onset  . Heart Problems Father   . Hypertension Mother   . Stroke Unknown    Social History   Socioeconomic History  . Marital status: Single    Spouse name: Not on file  . Number of children: Not on file  . Years of education: Not on file  . Highest education level: Not on file  Occupational History  . Occupation: umemployed     Comment: Works in [employer]   Social Needs  . Financial resource strain: Not on file  . Food insecurity:    Worry: Not on file    Inability: Not on file  . Transportation needs:    Medical: Not on file    Non-medical: Not on file  Tobacco Use  . Smoking status: Never Smoker  . Smokeless tobacco: Never Used  Substance and Sexual Activity  . Alcohol use: Yes    Alcohol/week: 3.6 oz    Types: 6 Cans of beer per week    Comment:  OCCASSIONALLY   . Drug use: No  . Sexual activity: Yes  Lifestyle  . Physical activity:    Days per week: Not on file    Minutes per session: Not on file  . Stress: Not on file  Relationships  . Social connections:    Talks on phone: Not on file    Gets together: Not on file    Attends religious service: Not on file    Active member of club or organization: Not on file    Attends meetings of clubs or organizations: Not on file    Relationship status: Not on file  Other Topics Concern  . Not on file  Social History Narrative   7 cups of caffeine a week    Past Surgical History:  Procedure Laterality Date  . BACK SURGERY    . BRAIN HEMATOMA EVACUATION  2006  . LEFT HEART CATHETERIZATION WITH CORONARY ANGIOGRAM N/A 02/17/2014   Procedure: LEFT HEART CATHETERIZATION WITH CORONARY ANGIOGRAM;  Surgeon: Lesleigh NoeHenry W Simi III, MD;  [facility];  Service: Cardiovascular;  Laterality: N/A;  . LUMBAR DISC SURGERY  2013  . PERCUTANEOUS CORONARY STENT INTERVENTION (PCI-S)  02/17/2014   Procedure: PERCUTANEOUS CORONARY STENT INTERVENTION (PCI-S);  Surgeon: Barry DienesHenry W  [name], MD;  Location: [facility] CATH LAB;  Service: Cardiovascular;;  . TRACHEOSTOMY  05/2004  . VENTRICULOSTOMY  2006   Past Medical History:  Diagnosis Date  . Acid reflux   . 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  . Chronic lower back pain   . Headache    "monthly" (02/18/2014)  . Hypertension   . 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 Stroke [facility]) 2006   "left leg weak since" (02/18/2014)   BP 129/89 (BP Location: Left Arm, Patient Position: Sitting, Cuff Size: Normal)   Pulse 82   Ht 5\' 7"  (1.702 m)   Wt 195 lb 12.8 oz (88.8 kg)   SpO2 97%   BMI 30.67 kg/m   Opioid Risk Score:   Fall Risk Score:  `1  Depression screen PHQ 2/9  Depression screen [facility] 2/9 08/28/2017 05/24/2017 04/24/2017 09/28/2015  Decreased Interest 0 0 0 0    Down, Depressed, Hopeless 0 0 0 0  PHQ - 2 Score 0 0 0 0     Review of Systems  Constitutional: Negative.   HENT: Negative.   Eyes: Negative.   Respiratory: Negative.   Cardiovascular: Negative.   Gastrointestinal: Positive for nausea.  Endocrine: Negative.   Genitourinary: Negative.   Musculoskeletal: Positive for arthralgias, back pain, gait problem and myalgias.  Skin: Negative.   Allergic/Immunologic: Negative.   Neurological: Positive for dizziness.  Hematological: Negative.   Psychiatric/Behavioral: Negative.   All other systems reviewed and are negative.      Objective:   Physical Exam  Constitutional: He is oriented to person, place, and time. He appears well-developed and well-nourished.  HENT:  Head: Normocephalic and atraumatic.  Neck: Normal range of motion. Neck supple.  Cardiovascular: Normal rate and regular rhythm.  Pulmonary/Chest: Effort normal and breath sounds normal.  Musculoskeletal:  Normal Muscle Bulk and Muscle Testing Reveals:  Upper Extremities: Full ROM and Muscle Strength on the Right 5/5 and Left 4/5 Lumbar Paraspinal Tenderness: L-3-L-5 Lower Extremities: Full ROM and Muscle Strength 4/5 Arises from Table slowly using cane for support Antalgic gait  Neurological: He is alert and oriented to person, place, and time.  Skin: Skin is warm and dry.  Nursing note and vitals reviewed.         Assessment & Plan:  1. History of multiple strokes with gait disorder, ongoing ataxia andspasticleft hemiparesis: ContinueHEP as Tolerated. 10/25/2017 2. Chronic low back pain with documented facet arthropathy and history of intradural defect/schwanomma requiring resection, per Dr. Riley Kill Note. Continue current medication regimen. Refilled:Percocet10/325 one q6 prn #120.10/25/2017 We will continue the opioid monitoring program, this consists of regular clinic visits, examinations, routine drug screening, pill counts as well as use of [place] Controlled Substance Reporting System. [place]CC[place] was reviewed  20 minutes of face to face patient care time was spent during this visit. All questions were encouraged and answered.  F/U in 1 month

## 2017-11-04 DIAGNOSIS — Z114 Encounter for screening for human immunodeficiency virus [HIV]: Secondary | ICD-10-CM | POA: Diagnosis not present

## 2017-11-04 DIAGNOSIS — E789 Disorder of lipoprotein metabolism, unspecified: Secondary | ICD-10-CM | POA: Diagnosis not present

## 2017-11-04 DIAGNOSIS — Z0000 Encounter for general adult medical examination without abnormal findings: Secondary | ICD-10-CM | POA: Diagnosis not present

## 2017-11-04 DIAGNOSIS — I10 Essential (primary) hypertension: Secondary | ICD-10-CM | POA: Diagnosis not present

## 2017-11-25 ENCOUNTER — Encounter: Payer: Self-pay | Admitting: Registered Nurse

## 2017-11-25 ENCOUNTER — Encounter: Payer: Medicaid Other | Attending: Physical Medicine & Rehabilitation | Admitting: Registered Nurse

## 2017-11-25 VITALS — BP 108/78 | HR 120 | Ht 67.0 in | Wt 198.0 lb

## 2017-11-25 DIAGNOSIS — R51 Headache: Secondary | ICD-10-CM | POA: Insufficient documentation

## 2017-11-25 DIAGNOSIS — K219 Gastro-esophageal reflux disease without esophagitis: Secondary | ICD-10-CM | POA: Diagnosis not present

## 2017-11-25 DIAGNOSIS — Z79899 Other long term (current) drug therapy: Secondary | ICD-10-CM

## 2017-11-25 DIAGNOSIS — I10 Essential (primary) hypertension: Secondary | ICD-10-CM | POA: Diagnosis not present

## 2017-11-25 DIAGNOSIS — G894 Chronic pain syndrome: Secondary | ICD-10-CM | POA: Diagnosis not present

## 2017-11-25 DIAGNOSIS — M545 Low back pain: Secondary | ICD-10-CM | POA: Diagnosis not present

## 2017-11-25 DIAGNOSIS — M47816 Spondylosis without myelopathy or radiculopathy, lumbar region: Secondary | ICD-10-CM

## 2017-11-25 DIAGNOSIS — G8929 Other chronic pain: Secondary | ICD-10-CM | POA: Diagnosis not present

## 2017-11-25 DIAGNOSIS — I69354 Hemiplegia and hemiparesis following cerebral infarction affecting left non-dominant side: Secondary | ICD-10-CM | POA: Diagnosis not present

## 2017-11-25 DIAGNOSIS — Z5181 Encounter for therapeutic drug level monitoring: Secondary | ICD-10-CM

## 2017-11-25 DIAGNOSIS — I69393 Ataxia following cerebral infarction: Secondary | ICD-10-CM | POA: Diagnosis not present

## 2017-11-25 DIAGNOSIS — G8114 Spastic hemiplegia affecting left nondominant side: Secondary | ICD-10-CM | POA: Diagnosis not present

## 2017-11-25 DIAGNOSIS — I69314 Frontal lobe and executive function deficit following cerebral infarction: Secondary | ICD-10-CM | POA: Diagnosis not present

## 2017-11-25 MED ORDER — OXYCODONE-ACETAMINOPHEN 10-325 MG PO TABS: 1.0000 | ORAL_TABLET | Freq: Four times a day (QID) | ORAL | 0 refills | Status: DC | PRN

## 2017-11-25 NOTE — Progress Notes (Signed)
Subjective:    Patient ID: George Villa, male    DOB: 11-18-1965, 52 y.o.   MRN: [id number]  HPI: Ms. George Villa is a 52 year old male who returns for follow up appointment for chronic pain and medication refill. He states his pain is located in his lower back. He rates his pain 8. His current exercise regime is walking and performing light chores.  [name] forgot his medication bottle, educated on the narcotic policy. He verbalizes understanding.   [name] Morphine Equivalent is 60.00 MME.  Last Oral Swab was Performed on 05/24/2017, it was consistent. Oral Swab was Performed today.    Pain Inventory Average Pain 8 Pain Right Now 8 My pain is dull  In the last 24 hours, has pain interfered with the following? General activity 6 Relation with others 6 Enjoyment of life 6 What TIME of day is your pain at its worst? night Sleep (in general) Fair  Pain is worse with: na Pain improves with: medication Relief from Meds: 6  Mobility use a cane ability to climb steps?  yes do you drive?  yes  Function disabled: date disabled .  Neuro/Psych bladder control problems  Prior Studies Any changes since last visit?  no  Physicians involved in your care Any changes since last visit?  no   Family History  Problem Relation Age of Onset  . Heart Problems Father   . Hypertension Mother   . Stroke Unknown    Social History   Socioeconomic History  . Marital status: Single    Spouse name: Not on file  . Number of children: Not on file  . Years of education: Not on file  . Highest education level: Not on file  Occupational History  . Occupation: umemployed     Comment: Works in [employer]   Social Needs  . Financial resource strain: Not on file  . Food insecurity:    Worry: Not on file    Inability: Not on file  . Transportation needs:    Medical: Not on file    Non-medical: Not on file  Tobacco Use  . Smoking status: Never Smoker  . Smokeless tobacco:  Never Used  Substance and Sexual Activity  . Alcohol use: Yes    Alcohol/week: 6.0 standard drinks    Types: 6 Cans of beer per week    Comment: OCCASSIONALLY   . Drug use: No  . Sexual activity: Yes  Lifestyle  . Physical activity:    Days per week: Not on file    Minutes per session: Not on file  . Stress: Not on file  Relationships  . Social connections:    Talks on phone: Not on file    Gets together: Not on file    Attends religious service: Not on file    Active member of club or organization: Not on file    Attends meetings of clubs or organizations: Not on file    Relationship status: Not on file  Other Topics Concern  . Not on file  Social History Narrative   7 cups of caffeine a week    Past Surgical History:  Procedure Laterality Date  . BACK SURGERY    . BRAIN HEMATOMA EVACUATION  2006  . LEFT HEART CATHETERIZATION WITH CORONARY ANGIOGRAM N/A 02/17/2014   Procedure: LEFT HEART CATHETERIZATION WITH CORONARY ANGIOGRAM;  Surgeon: Lesleigh NoeHenry W Esquilin III, MD;  [facility];  Service: Cardiovascular;  Laterality: N/A;  . LUMBAR  DISC SURGERY  2013  . PERCUTANEOUS CORONARY STENT INTERVENTION (PCI-S)  02/17/2014   Procedure: PERCUTANEOUS CORONARY STENT INTERVENTION (PCI-S);  Surgeon: Lesleigh NoeHenry W Damon III, MD;  [facility];  Service: Cardiovascular;;  . TRACHEOSTOMY  05/2004  . VENTRICULOSTOMY  2006   Past Medical History:  Diagnosis Date  . Acid reflux   . 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  . Chronic lower back pain   . Headache    "monthly" (02/18/2014)  . Hypertension   . 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 Stroke [facility](HCC) 2006   "left leg weak since" (02/18/2014)   There were no vitals taken for this visit.  Opioid Risk Score:   Fall Risk Score:  `1  Depression screen PHQ 2/9  Dep[facility] screen [facility]/9 08/28/2017 05/24/2017 04/24/2017 09/28/2015  Decreased  Interest 0 0 0 0  Down, Depressed, Hopeless 0 0 0 0  PHQ - 2 Score 0 0 0 0     Review of Systems  Constitutional: Negative.   HENT: Negative.   Eyes: Negative.   Respiratory: Negative.   Cardiovascular: Negative.   Gastrointestinal: Negative.   Endocrine: Negative.   Genitourinary: Positive for difficulty urinating.  Musculoskeletal: Positive for arthralgias, back pain, gait problem and myalgias.  Skin: Negative.   Allergic/Immunologic: Negative.   Hematological: Negative.   Psychiatric/Behavioral: Negative.   All other systems reviewed and are negative.      Objective:   Physical Exam  Constitutional: He is oriented to person, place, and time. He appears well-developed and well-nourished.  HENT:  Head: Normocephalic and atraumatic.  Neck: Normal range of motion. Neck supple.  Cardiovascular: Normal rate and regular rhythm.  Pulmonary/Chest: Effort normal and breath sounds normal.  Musculoskeletal:  Normal Muscle Bulk and Muscle Testing Reveals: Upper Extremities: Full ROM and Muscle Strength : Right 5/5 and Left 4/5 Lumbar Paraspinal Tenderness: L-3-L-5 Lower Extremities: Full ROM and Muscle Strength 5/5 Arises from Table slowly using cane for support Antalgic gait   Neurological: He is alert and oriented to person, place, and time.  Skin: Skin is warm and dry.  Psychiatric: He has a normal mood and affect. His behavior is normal.  Nursing note and vitals reviewed.         Assessment & Plan:  1. History of multiple strokes with gait disorder, ongoing ataxia andspasticleft hemiparesis: ContinueHEP as Tolerated. 11/25/2017 2. Chronic low back pain with documented facet arthropathy and history of intradural defect/schwanomma requiring resection, per Dr. Riley KillSwartz Note. Continue current medication regimen. Refilled:Percocet10/325 one q6 prn #120.11/25/2017 We will continue the opioid monitoring program, this consists of regular clinic visits, examinations,  routine drug screening, pill counts as well as use of [place] Controlled Substance Reporting System. [place] was reviewed  20 minutes of face to face patient care time was spent during this visit. All questions were encouraged and answered.  F/U in 1 month

## 2017-11-28 LAB — DRUG TOX MONITOR 1 W/CONF, ORAL FLD
AMPHETAMINES: NEGATIVE ng/mL (ref <10)
BENZODIAZEPINES: NEGATIVE ng/mL (ref <0.50)
BUPRENORPHINE: NEGATIVE ng/mL (ref <0.10)
Barbiturates: NEGATIVE ng/mL (ref <10)
Cocaine: NEGATIVE ng/mL (ref <5.0)
FENTANYL: NEGATIVE ng/mL (ref <0.10)
HEROIN METABOLITE: NEGATIVE ng/mL (ref <1.0)
MARIJUANA: NEGATIVE ng/mL (ref <2.5)
MDMA: NEGATIVE ng/mL (ref <10)
MEPROBAMATE: NEGATIVE ng/mL (ref <2.5)
METHADONE: NEGATIVE ng/mL (ref <5.0)
NICOTINE METABOLITE: NEGATIVE ng/mL (ref <5.0)
Opiates: NEGATIVE ng/mL (ref <2.5)
Phencyclidine: NEGATIVE ng/mL (ref <10)
Tapentadol: NEGATIVE ng/mL (ref <5.0)
Tramadol: NEGATIVE ng/mL (ref <5.0)
Zolpidem: NEGATIVE ng/mL (ref <5.0)

## 2017-11-28 LAB — DRUG TOX ALC METAB W/CON, ORAL FLD: Alcohol Metabolite: NEGATIVE ng/mL (ref <25)

## 2017-11-29 DIAGNOSIS — E789 Disorder of lipoprotein metabolism, unspecified: Secondary | ICD-10-CM | POA: Diagnosis not present

## 2017-11-29 DIAGNOSIS — I10 Essential (primary) hypertension: Secondary | ICD-10-CM | POA: Diagnosis not present

## 2017-11-29 DIAGNOSIS — I639 Cerebral infarction, unspecified: Secondary | ICD-10-CM | POA: Diagnosis not present

## 2017-11-29 DIAGNOSIS — K219 Gastro-esophageal reflux disease without esophagitis: Secondary | ICD-10-CM | POA: Diagnosis not present

## 2017-12-03 ENCOUNTER — Telehealth: Payer: Self-pay | Admitting: *Deleted

## 2017-12-03 NOTE — Telephone Encounter (Addendum)
Oral swab drug screen was consistent for prescribed medications being absent due to last taken 2-3 days prior to test. Previous tests have been consistent..Marland Kitchen

## 2017-12-23 ENCOUNTER — Encounter: Payer: Self-pay | Admitting: Physical Medicine & Rehabilitation

## 2017-12-23 ENCOUNTER — Encounter: Payer: Medicaid Other | Attending: Physical Medicine & Rehabilitation | Admitting: Physical Medicine & Rehabilitation

## 2017-12-23 VITALS — BP 130/87 | HR 98 | Ht 67.0 in | Wt 196.4 lb

## 2017-12-23 DIAGNOSIS — I69314 Frontal lobe and executive function deficit following cerebral infarction: Secondary | ICD-10-CM | POA: Insufficient documentation

## 2017-12-23 DIAGNOSIS — M545 Low back pain: Secondary | ICD-10-CM | POA: Diagnosis not present

## 2017-12-23 DIAGNOSIS — I69354 Hemiplegia and hemiparesis following cerebral infarction affecting left non-dominant side: Secondary | ICD-10-CM | POA: Insufficient documentation

## 2017-12-23 DIAGNOSIS — I69393 Ataxia following cerebral infarction: Secondary | ICD-10-CM | POA: Insufficient documentation

## 2017-12-23 DIAGNOSIS — I10 Essential (primary) hypertension: Secondary | ICD-10-CM | POA: Diagnosis not present

## 2017-12-23 DIAGNOSIS — R51 Headache: Secondary | ICD-10-CM | POA: Insufficient documentation

## 2017-12-23 DIAGNOSIS — K219 Gastro-esophageal reflux disease without esophagitis: Secondary | ICD-10-CM | POA: Insufficient documentation

## 2017-12-23 DIAGNOSIS — G894 Chronic pain syndrome: Secondary | ICD-10-CM | POA: Diagnosis not present

## 2017-12-23 DIAGNOSIS — G8929 Other chronic pain: Secondary | ICD-10-CM | POA: Insufficient documentation

## 2017-12-23 DIAGNOSIS — M47816 Spondylosis without myelopathy or radiculopathy, lumbar region: Secondary | ICD-10-CM | POA: Diagnosis not present

## 2017-12-23 DIAGNOSIS — G8114 Spastic hemiplegia affecting left nondominant side: Secondary | ICD-10-CM | POA: Diagnosis not present

## 2017-12-23 MED ORDER — OXYCODONE-ACETAMINOPHEN 10-325 MG PO TABS: 1.0000 | ORAL_TABLET | Freq: Four times a day (QID) | ORAL | 0 refills | Status: DC | PRN

## 2017-12-23 NOTE — Progress Notes (Signed)
Subjective:    Patient ID: George Villa, male    DOB: 02-Jul-1965, 52 y.o.   MRN: [id number]  HPI   Mr. Thueson is here regarding his gait disorder and chronic pain. He states that his pain is essentially stable. He has noticed GERD since being on pain medication and asked if it could be related to his med.  He has been on Protonix which seems to help his symptoms greatly.  He does some basic exercising at home.  He uses his quad cane for balance.  Denies any recent falls.  He still favors the left leg quite a bit.  For pain control is taking Percocet 10/325 1 every 6 hours as needed.  He reports regular bowel movements and normal bladder function.  Pain Inventory Average Pain 9 Pain Right Now 8 My pain is dull  In the last 24 hours, has pain interfered with the following? General activity 5 Relation with others 5 Enjoyment of life 5 What TIME of day is your pain at its worst? night Sleep (in general) Fair  Pain is worse with: walking and unsure Pain improves with: medication Relief from Meds: .  Mobility walk without assistance use a cane  Function disabled: date disabled .  Neuro/Psych trouble walking  Prior Studies Any changes since last visit?  no  Physicians involved in your care Any changes since last visit?  no   Family History  Problem Relation Age of Onset  . Heart Problems Father   . Hypertension Mother   . Stroke Unknown    Social History   Socioeconomic History  . Marital status: Single    Spouse name: Not on file  . Number of children: Not on file  . Years of education: Not on file  . Highest education level: Not on file  Occupational History  . Occupation: umemployed     Comment: Works in [employer]  . Financial resource strain: Not on file  . Food insecurity:    Worry: Not on file    Inability: Not on file  . Transportation needs:    Medical: Not on file    Non-medical: Not on file  Tobacco Use  . Smoking status: Never  Smoker  . Smokeless tobacco: Never Used  Substance and Sexual Activity  . Alcohol use: Yes    Alcohol/week: 6.0 standard drinks    Types: 6 Cans of beer per week    Comment: OCCASSIONALLY   . Drug use: No  . Sexual activity: Yes  Lifestyle  . Physical activity:    Days per week: Not on file    Minutes per session: Not on file  . Stress: Not on file  Relationships  . Social connections:    Talks on phone: Not on file    Gets together: Not on file    Attends religious service: Not on file    Active member of club or organization: Not on file    Attends meetings of clubs or organizations: Not on file    Relationship status: Not on file  Other Topics Concern  . Not on file  Social History Narrative   7 cups of caffeine a week    Past Surgical History:  Procedure Laterality Date  . BACK SURGERY    . BRAIN HEMATOMA EVACUATION  2006  . LEFT HEART CATHETERIZATION WITH CORONARY ANGIOGRAM N/A 02/17/2014   Procedure: LEFT HEART CATHETERIZATION WITH CORONARY ANGIOGRAM;  Surgeon: [name], MD;  Location:  [facility] CATH LAB;  Service: Cardiovascular;  Laterality: N/A;  . LUMBAR DISC SURGERY  2013  . PERCUTANEOUS CORONARY STENT INTERVENTION (PCI-S)  02/17/2014   Procedure: PERCUTANEOUS CORONARY STENT INTERVENTION (PCI-S);  Surgeon: Lesleigh NoeHenry W Bruno III, MD;  [facility];  Service: Cardiovascular;;  . TRACHEOSTOMY  05/2004  . VENTRICULOSTOMY  2006   Past Medical History:  Diagnosis Date  . Acid reflux   . 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  . Chronic lower back pain   . Headache    "monthly" (02/18/2014)  . Hypertension   . 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 Stroke [facility](HCC) 2006   "left leg weak since" (02/18/2014)   BP 130/87   Pulse 98   Ht 5\' 7"  (1.702 m)   Wt 196 lb 6.4 oz (89.1 kg)   SpO2 97%   BMI 30.76 kg/m   Opioid Risk Score:   Fall Risk Score:  `1  Depression screen  PHQ 2/9  Depression screen [facility] 2/9 08/28/2017 05/24/2017 04/24/2017 09/28/2015  Decreased Interest 0 0 0 0  Down, Depressed, Hopeless 0 0 0 0  PHQ - 2 Score 0 0 0 0     Review of Systems  Constitutional: Negative.   HENT: Negative.   Eyes: Negative.   Respiratory: Negative.   Cardiovascular: Negative.   Gastrointestinal: Negative.   Endocrine: Negative.   Genitourinary: Negative.   Musculoskeletal: Positive for arthralgias, back pain, gait problem and myalgias.  Skin: Negative.   Allergic/Immunologic: Negative.   Hematological: Negative.   Psychiatric/Behavioral: Negative.   All other systems reviewed and are negative.      Objective:   Physical Exam General: No acute distress HEENT: EOMI, oral membranes moist Cards: reg rate  Chest: normal effort Abdomen: Soft, NT, ND Skin: dry, intact Extremities: no edema Neuro:Pt is cognitively appropriate with normal insight, memory, and awareness. Cranial nerves 2-12 are intact. Sensory exam is normal. Reflexes are 2+ in all 4's. Fine motor coordination is intact. No tremors. Motor function is grossly 5/5 RUE and RLE. LUE 4/5. LLE 3+ go 4-/5 prox to distal.RUE and RLE ataxic. Tr to 1/4 tone LLE.  Still favors left leg during gait.  Musculoskeletal:LBP TTP. Can bend to about 60 degrees Psych:pleasant and cooperative     Assessment & Plan:  1. History of multiple strokes with gait disorder, ongoing ataxia andspasticleft hemiparesis 2. Chronic low back pain with documented facet arthropathy and history of intradural defect/schwanomma requiring resection.  mechanical component to pain as well  Plan:  1.Continuebaclofen 10mg  TID to addres LBP and left sided tone 2.Continue with HEP, discussed mechanics and posture as well 3.Percocet10/325 one q6 prn #120. Refilled today for later this month.We will continue the controlled substance monitoring program, this consists of regular clinic visits, examinations, routine  drug screening, pill counts as well as use of [place] Controlled Substance Reporting System. [place] was reviewed today.   4.  stressed importance of sleep  Follow up in about 63month with  \nurse practitioner.10minutes of face to face patient care time were spent during this visit. All questions were encouraged and answered. Greater than 50% of time during this encounter was spent counseling patient/family in regard to gait mechanics, posture and importance of these with his pain.

## 2017-12-23 NOTE — Patient Instructions (Signed)
PLEASE FEEL FREE TO CALL OUR OFFICE WITH ANY PROBLEMS OR QUESTIONS ([phone])      

## 2017-12-25 ENCOUNTER — Ambulatory Visit: Payer: Medicaid Other | Admitting: Physical Medicine & Rehabilitation

## 2018-01-24 ENCOUNTER — Encounter: Payer: Self-pay | Admitting: Registered Nurse

## 2018-01-24 ENCOUNTER — Encounter: Payer: Medicaid Other | Attending: Physical Medicine & Rehabilitation | Admitting: Registered Nurse

## 2018-01-24 VITALS — BP 131/90 | HR 88 | Resp 14 | Ht 67.0 in | Wt 196.0 lb

## 2018-01-24 DIAGNOSIS — I10 Essential (primary) hypertension: Secondary | ICD-10-CM | POA: Insufficient documentation

## 2018-01-24 DIAGNOSIS — Z5181 Encounter for therapeutic drug level monitoring: Secondary | ICD-10-CM | POA: Diagnosis not present

## 2018-01-24 DIAGNOSIS — G8114 Spastic hemiplegia affecting left nondominant side: Secondary | ICD-10-CM

## 2018-01-24 DIAGNOSIS — Z79899 Other long term (current) drug therapy: Secondary | ICD-10-CM | POA: Diagnosis not present

## 2018-01-24 DIAGNOSIS — I69354 Hemiplegia and hemiparesis following cerebral infarction affecting left non-dominant side: Secondary | ICD-10-CM | POA: Insufficient documentation

## 2018-01-24 DIAGNOSIS — I69393 Ataxia following cerebral infarction: Secondary | ICD-10-CM | POA: Diagnosis not present

## 2018-01-24 DIAGNOSIS — M47816 Spondylosis without myelopathy or radiculopathy, lumbar region: Secondary | ICD-10-CM

## 2018-01-24 DIAGNOSIS — I69314 Frontal lobe and executive function deficit following cerebral infarction: Secondary | ICD-10-CM | POA: Insufficient documentation

## 2018-01-24 DIAGNOSIS — K219 Gastro-esophageal reflux disease without esophagitis: Secondary | ICD-10-CM | POA: Diagnosis not present

## 2018-01-24 DIAGNOSIS — M545 Low back pain: Secondary | ICD-10-CM | POA: Insufficient documentation

## 2018-01-24 DIAGNOSIS — R51 Headache: Secondary | ICD-10-CM | POA: Diagnosis not present

## 2018-01-24 DIAGNOSIS — G894 Chronic pain syndrome: Secondary | ICD-10-CM | POA: Diagnosis not present

## 2018-01-24 DIAGNOSIS — G8929 Other chronic pain: Secondary | ICD-10-CM | POA: Diagnosis not present

## 2018-01-24 MED ORDER — OXYCODONE-ACETAMINOPHEN 10-325 MG PO TABS: 1.0000 | ORAL_TABLET | Freq: Four times a day (QID) | ORAL | 0 refills | Status: DC | PRN

## 2018-01-24 NOTE — Progress Notes (Signed)
Subjective:    Patient ID: George Villa, male    DOB: March 20, 1966, 52 y.o.   MRN: [id number]  HPI: [name] is a 52 year old male who returns for follow up appointment for chronic pain and medication refill. He states his pain is located in his lower back. He rates his pain 8. His current exercise regime is walking and performing household chores.   [name] is 60.00 MME. Last Oral Swab was Performed on 11/25/17, it was consistent.   Pain Inventory Average Pain 8 Pain Right Now 8 My pain is dull  In the last 24 hours, has pain interfered with the following? General activity 7 Relation with others 7 Enjoyment of life 0 What TIME of day is your pain at its worst? night Sleep (in general) Fair  Pain is worse with: walking and bending Pain improves with: medication Relief from Meds: 9  Mobility walk with assistance use a cane ability to climb steps?  yes  Function disabled: date disabled .  Neuro/Psych bowel control problems trouble walking  Prior Studies Any changes since last visit?  no  Physicians involved in your care Any changes since last visit?  no   Family History  Problem Relation Age of Onset  . Heart Problems Father   . Hypertension Mother   . Stroke Unknown    Social History   Socioeconomic History  . Marital status: Single    Spouse name: Not on file  . Number of children: Not on file  . Years of education: Not on file  . Highest education level: Not on file  Occupational History  . Occupation: umemployed     Comment: Works in [employer]  . Financial resource strain: Not on file  . Food insecurity:    Worry: Not on file    Inability: Not on file  . Transportation needs:    Medical: Not on file    Non-medical: Not on file  Tobacco Use  . Smoking status: Never Smoker  . Smokeless tobacco: Never Used  Substance and Sexual Activity  . Alcohol use: Yes    Alcohol/week: 6.0 standard drinks   Types: 6 Cans of beer per week    Comment: OCCASSIONALLY   . Drug use: No  . Sexual activity: Yes  Lifestyle  . Physical activity:    Days per week: Not on file    Minutes per session: Not on file  . Stress: Not on file  Relationships  . Social connections:    Talks on phone: Not on file    Gets together: Not on file    Attends religious service: Not on file    Active member of club or organization: Not on file    Attends meetings of clubs or organizations: Not on file    Relationship status: Not on file  Other Topics Concern  . Not on file  Social History Narrative   7 cups of caffeine a week    Past Surgical History:  Procedure Laterality Date  . BACK SURGERY    . BRAIN HEMATOMA EVACUATION  2006  . LEFT HEART CATHETERIZATION WITH CORONARY ANGIOGRAM N/A 02/17/2014   Procedure: LEFT HEART CATHETERIZATION WITH CORONARY ANGIOGRAM;  Surgeon: [name], MD;  Location: [facility] CATH LAB;  Service: Cardiovascular;  Laterality: N/A;  . LUMBAR DISC SURGERY  2013  . PERCUTANEOUS CORONARY STENT INTERVENTION (PCI-S)  02/17/2014   Procedure: PERCUTANEOUS CORONARY STENT INTERVENTION (PCI-S);  Surgeon:  [name], MD;  Location: [facility] CATH LAB;  Service: Cardiovascular;;  . TRACHEOSTOMY  05/2004  . VENTRICULOSTOMY  2006   Past Medical History:  Diagnosis Date  . Acid reflux   . 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  . Chronic lower back pain   . Headache    "monthly" (02/18/2014)  . Hypertension   . 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 Stroke [facility]) 2006   "left leg weak since" (02/18/2014)   BP 131/90 (BP Location: Right Arm, Patient Position: Sitting, Cuff Size: Normal)   Pulse 88   Resp 14   Ht 5\' 7"  (1.702 m)   Wt 196 lb (88.9 kg)   SpO2 96%   BMI 30.70 kg/m   Opioid Risk Score:   Fall Risk Score:  `1  Depression screen PHQ 2/9  Depression screen [facility] 2/9 08/28/2017 05/24/2017  04/24/2017 09/28/2015  Decreased Interest 0 0 0 0  Down, Depressed, Hopeless 0 0 0 0  PHQ - 2 Score 0 0 0 0    Review of Systems  Constitutional: Negative.   HENT: Negative.   Eyes: Negative.   Respiratory: Negative.   Cardiovascular: Negative.   Gastrointestinal: Positive for diarrhea.  Genitourinary: Negative.   Musculoskeletal: Positive for back pain and gait problem.  Skin: Negative.   Allergic/Immunologic: Negative.   Hematological: Bruises/bleeds easily.  Psychiatric/Behavioral: Negative.   All other systems reviewed and are negative.      Objective:   Physical Exam  Constitutional: He is oriented to person, place, and time. He appears well-developed and well-nourished.  HENT:  Head: Normocephalic and atraumatic.  Neck: Normal range of motion. Neck supple.  Cardiovascular: Normal rate and regular rhythm.  Pulmonary/Chest: Effort normal and breath sounds normal.  Musculoskeletal:  Normal Muscle Bulk and Muscle Testing Reveals:  Upper Extremities: Full ROM and Muscle Strength 5/5 Lumbar Paraspinal Tenderness: L-3-L-5 Lower Extremities: Full ROM and Muscle Strength 5/5 Arises from Table with Ease Narrow Based Gait  Neurological: He is alert and oriented to person, place, and time.  Skin: Skin is warm and dry.  Psychiatric: He has a normal mood and affect. His behavior is normal.  Nursing note and vitals reviewed.         Assessment & Plan:  1. History of multiple strokes with gait disorder, ongoing ataxia andspasticleft hemiparesis: ContinueHEP as Tolerated. 01/24/2018 2. Chronic low back pain with documented facet arthropathy and history of intradural defect/schwanomma requiring resection, per Dr. Riley Kill Note. Continue current medication regimen. Refilled:Percocet10/325 one q6 prn #120.01/24/2018 We will continue the opioid monitoring program, this consists of regular clinic visits, examinations, routine drug screening, pill counts as well as use of  [place] Controlled Substance Reporting System. [place]CC[place] was reviewed  20 minutes of face to face patient care time was spent during this visit. All questions were encouraged and answered.  F/U in 1 month

## 2018-01-28 DIAGNOSIS — Z23 Encounter for immunization: Secondary | ICD-10-CM | POA: Diagnosis not present

## 2018-02-19 ENCOUNTER — Encounter: Payer: Self-pay | Admitting: Registered Nurse

## 2018-02-19 ENCOUNTER — Encounter: Payer: Medicaid Other | Attending: Physical Medicine & Rehabilitation | Admitting: Registered Nurse

## 2018-02-19 ENCOUNTER — Telehealth: Payer: Self-pay | Admitting: *Deleted

## 2018-02-19 VITALS — BP 148/95 | HR 82 | Ht 67.0 in | Wt 197.0 lb

## 2018-02-19 DIAGNOSIS — I69314 Frontal lobe and executive function deficit following cerebral infarction: Secondary | ICD-10-CM | POA: Diagnosis not present

## 2018-02-19 DIAGNOSIS — I69354 Hemiplegia and hemiparesis following cerebral infarction affecting left non-dominant side: Secondary | ICD-10-CM | POA: Diagnosis not present

## 2018-02-19 DIAGNOSIS — K219 Gastro-esophageal reflux disease without esophagitis: Secondary | ICD-10-CM | POA: Insufficient documentation

## 2018-02-19 DIAGNOSIS — Z5181 Encounter for therapeutic drug level monitoring: Secondary | ICD-10-CM

## 2018-02-19 DIAGNOSIS — I10 Essential (primary) hypertension: Secondary | ICD-10-CM | POA: Diagnosis not present

## 2018-02-19 DIAGNOSIS — R51 Headache: Secondary | ICD-10-CM | POA: Insufficient documentation

## 2018-02-19 DIAGNOSIS — M545 Low back pain: Secondary | ICD-10-CM | POA: Insufficient documentation

## 2018-02-19 DIAGNOSIS — M47816 Spondylosis without myelopathy or radiculopathy, lumbar region: Secondary | ICD-10-CM

## 2018-02-19 DIAGNOSIS — Z79899 Other long term (current) drug therapy: Secondary | ICD-10-CM

## 2018-02-19 DIAGNOSIS — G8929 Other chronic pain: Secondary | ICD-10-CM | POA: Diagnosis not present

## 2018-02-19 DIAGNOSIS — G8114 Spastic hemiplegia affecting left nondominant side: Secondary | ICD-10-CM | POA: Diagnosis not present

## 2018-02-19 DIAGNOSIS — G894 Chronic pain syndrome: Secondary | ICD-10-CM | POA: Diagnosis not present

## 2018-02-19 DIAGNOSIS — I69393 Ataxia following cerebral infarction: Secondary | ICD-10-CM | POA: Diagnosis not present

## 2018-02-19 MED ORDER — OXYCODONE-ACETAMINOPHEN 10-325 MG PO TABS: 1.0000 | ORAL_TABLET | Freq: Four times a day (QID) | ORAL | 0 refills | Status: DC | PRN

## 2018-02-19 NOTE — Progress Notes (Signed)
Subjective:    Patient ID: George Larssonnthony G Tawil, male    DOB: Sep 23, 1965, 52 y.o.   MRN: [id number]  HPI: Ms. George Villa is a 52 year old male who returns for follow up appointment for chronic pain and medication refill. He states his pain is located in his lower back. He rates his pain 7. His current exercise regime is walking.   George Villa Morphine Equivalent is 60.00 MME. Last Oral Swab was Performed on 11/25/17, it was consistent.   Pain Inventory Average Pain 7 Pain Right Now 7 My pain is intermittent, dull and aching  In the last 24 hours, has pain interfered with the following? General activity 6 Relation with others 6 Enjoyment of life 6 What TIME of day is your pain at its worst? night Sleep (in general) Poor  Pain is worse with: walking, bending, standing and some activites Pain improves with: rest, heat/ice and medication Relief from Meds: 10  Mobility walk with assistance use a cane ability to climb steps?  yes do you drive?  yes  Function disabled: date disabled 2009  Neuro/Psych trouble walking  Prior Studies Any changes since last visit?  no  Physicians involved in your care Any changes since last visit?  no   Family History  Problem Relation Age of Onset  . Heart Problems Father   . Hypertension Mother   . Stroke Unknown    Social History   Socioeconomic History  . Marital status: Single    Spouse name: Not on file  . Number of children: Not on file  . Years of education: Not on file  . Highest education level: Not on file  Occupational History  . Occupation: umemployed     Comment: Works in [employer]   Social Needs  . Financial resource strain: Not on file  . Food insecurity:    Worry: Not on file    Inability: Not on file  . Transportation needs:    Medical: Not on file    Non-medical: Not on file  Tobacco Use  . Smoking status: Never Smoker  . Smokeless tobacco: Never Used  Substance and Sexual Activity  . Alcohol use: Yes   Alcohol/week: 6.0 standard drinks    Types: 6 Cans of beer per week    Comment: OCCASSIONALLY   . Drug use: No  . Sexual activity: Yes  Lifestyle  . Physical activity:    Days per week: Not on file    Minutes per session: Not on file  . Stress: Not on file  Relationships  . Social connections:    Talks on phone: Not on file    Gets together: Not on file    Attends religious service: Not on file    Active member of club or organization: Not on file    Attends meetings of clubs or organizations: Not on file    Relationship status: Not on file  Other Topics Concern  . Not on file  Social History Narrative   7 cups of caffeine a week    Past Surgical History:  Procedure Laterality Date  . BACK SURGERY    . BRAIN HEMATOMA EVACUATION  2006  . LEFT HEART CATHETERIZATION WITH CORONARY ANGIOGRAM N/A 02/17/2014   Procedure: LEFT HEART CATHETERIZATION WITH CORONARY ANGIOGRAM;  Surgeon: Lesleigh NoeHenry W Nebel III, MD;  [facility];  Service: Cardiovascular;  Laterality: N/A;  . LUMBAR DISC SURGERY  2013  . PERCUTANEOUS CORONARY STENT INTERVENTION (PCI-S)  02/17/2014  Procedure: PERCUTANEOUS CORONARY STENT INTERVENTION (PCI-S);  Surgeon: [name], MD;  Location: [facility] CATH LAB;  Service: Cardiovascular;;  . TRACHEOSTOMY  05/2004  . VENTRICULOSTOMY  2006   Past Medical History:  Diagnosis Date  . Acid reflux   . 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  . Chronic lower back pain   . Headache    "monthly" (02/18/2014)  . Hypertension   . 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 Stroke [facility]) 2006   "left leg weak since" (02/18/2014)   There were no vitals taken for this visit.  Opioid Risk Score:   Fall Risk Score:  `1  Depression screen PHQ 2/9  Depression screen [facility] 2/9 08/28/2017 05/24/2017 04/24/2017 09/28/2015  Decreased Interest 0 0 0 0  Down, Depressed, Hopeless 0 0 0 0  PHQ - 2 Score 0 0  0 0     Review of Systems  Constitutional: Negative.   HENT: Negative.   Eyes: Negative.   Respiratory: Negative.   Cardiovascular: Negative.   Gastrointestinal: Negative.   Endocrine: Negative.   Genitourinary: Negative.   Musculoskeletal: Positive for arthralgias, back pain, gait problem and myalgias.  Skin: Negative.   Allergic/Immunologic: Negative.   Hematological: Negative.   Psychiatric/Behavioral: Negative.   All other systems reviewed and are negative.      Objective:   Physical Exam  Constitutional: He appears well-developed and well-nourished.  HENT:  Head: Normocephalic and atraumatic.  Neck: Normal range of motion. Neck supple.  Cardiovascular: Normal rate and regular rhythm.  Pulmonary/Chest: Effort normal and breath sounds normal.  Musculoskeletal:  Normal Muscle Bulk and Muscle Testing Reveals: Upper Extremities: Full ROM and Muscle Strength 5/5 Lumbar Paraspinal Tenderness: L-4-L-5 Lower Extremities: Full ROM and Muscle Strength 5/5 Arises from Table with ease, using cane for support Narrow Based gait   Skin: Skin is warm and dry.  Psychiatric: He has a normal mood and affect. His behavior is normal.  Nursing note and vitals reviewed.         Assessment & Plan:  1. History of multiple strokes with gait disorder, ongoing ataxia andspasticleft hemiparesis: ContinueHEP as Tolerated. 02/19/2018 2. Chronic low back pain with documented facet arthropathy and history of intradural defect/schwanomma requiring resection, per Dr. Riley Kill Note. Continue current medication regimen. Refilled:Percocet10/325 one q6 prn #120.02/19/2018 We will continue the opioid monitoring program, this consists of regular clinic visits, examinations, routine drug screening, pill counts as well as use of [place] Controlled Substance Reporting System. NCCSRS was reviewed  20 minutes of face to face patient care time was spent during this visit. All questions were  encouraged and answered.  F/U in 1 month

## 2018-02-19 NOTE — Telephone Encounter (Signed)
George Villa called and stated his pharmacy says he needs a prior auth for his oxycodone.

## 2018-02-20 NOTE — Telephone Encounter (Signed)
Completed and phoned pharmacy

## 2018-03-20 ENCOUNTER — Encounter: Payer: Medicaid Other | Attending: Physical Medicine & Rehabilitation | Admitting: Registered Nurse

## 2018-03-20 ENCOUNTER — Encounter: Payer: Self-pay | Admitting: Registered Nurse

## 2018-03-20 VITALS — BP 137/84 | HR 98

## 2018-03-20 DIAGNOSIS — M47816 Spondylosis without myelopathy or radiculopathy, lumbar region: Secondary | ICD-10-CM | POA: Diagnosis not present

## 2018-03-20 DIAGNOSIS — G8114 Spastic hemiplegia affecting left nondominant side: Secondary | ICD-10-CM

## 2018-03-20 DIAGNOSIS — I69354 Hemiplegia and hemiparesis following cerebral infarction affecting left non-dominant side: Secondary | ICD-10-CM | POA: Insufficient documentation

## 2018-03-20 DIAGNOSIS — Z79891 Long term (current) use of opiate analgesic: Secondary | ICD-10-CM | POA: Diagnosis not present

## 2018-03-20 DIAGNOSIS — R51 Headache: Secondary | ICD-10-CM | POA: Insufficient documentation

## 2018-03-20 DIAGNOSIS — K219 Gastro-esophageal reflux disease without esophagitis: Secondary | ICD-10-CM | POA: Diagnosis not present

## 2018-03-20 DIAGNOSIS — I69314 Frontal lobe and executive function deficit following cerebral infarction: Secondary | ICD-10-CM | POA: Insufficient documentation

## 2018-03-20 DIAGNOSIS — Z5181 Encounter for therapeutic drug level monitoring: Secondary | ICD-10-CM | POA: Diagnosis not present

## 2018-03-20 DIAGNOSIS — I10 Essential (primary) hypertension: Secondary | ICD-10-CM | POA: Insufficient documentation

## 2018-03-20 DIAGNOSIS — M545 Low back pain: Secondary | ICD-10-CM | POA: Diagnosis not present

## 2018-03-20 DIAGNOSIS — G894 Chronic pain syndrome: Secondary | ICD-10-CM | POA: Diagnosis not present

## 2018-03-20 DIAGNOSIS — I69393 Ataxia following cerebral infarction: Secondary | ICD-10-CM | POA: Diagnosis not present

## 2018-03-20 DIAGNOSIS — G8929 Other chronic pain: Secondary | ICD-10-CM | POA: Diagnosis not present

## 2018-03-20 MED ORDER — OXYCODONE-ACETAMINOPHEN 10-325 MG PO TABS: 1.0000 | ORAL_TABLET | Freq: Four times a day (QID) | ORAL | 0 refills | Status: DC | PRN

## 2018-03-20 NOTE — Progress Notes (Signed)
Subjective:    Patient ID: George Villa, male    DOB: 1965/07/06, 52 y.o.   MRN: [id number]  HPI: George Villa is a 52 y.o. male who returns for follow up appointment for chronic pain and medication refill. He states his  pain is located in his  Lower back. He rates his pain 8. His current exercise regime is walking and riding his stationary bicycle for 30 minutes daily.   [name] Morphine equivalent is 60.00 MME.  Last Oral Swab was Performed on 11/25/2017, it was consistent.   Pain Inventory Average Pain 6 Pain Right Now 8 My pain is dull  In the last 24 hours, has pain interfered with the following? General activity 6 Relation with others 6 Enjoyment of life 6 What TIME of day is your pain at its worst? night Sleep (in general) Fair  Pain is worse with: walking and bending Pain improves with: medication Relief from Meds: 9  Mobility walk with assistance use a cane how many minutes can you walk? 10 do you drive?  yes  Function not employed: date last employed n/a  Neuro/Psych bladder control problems  Prior Studies Any changes since last visit?  no  Physicians involved in your care Any changes since last visit?  no   Family History  Problem Relation Age of Onset  . Heart Problems Father   . Hypertension Mother   . Stroke Unknown    Social History   Socioeconomic History  . Marital status: Single    Spouse name: Not on file  . Number of children: Not on file  . Years of education: Not on file  . Highest education level: Not on file  Occupational History  . Occupation: umemployed     Comment: Works in [employer]   Social Needs  . Financial resource strain: Not on file  . Food insecurity:    Worry: Not on file    Inability: Not on file  . Transportation needs:    Medical: Not on file    Non-medical: Not on file  Tobacco Use  . Smoking status: Never Smoker  . Smokeless tobacco: Never Used  Substance and Sexual Activity  . Alcohol use: Yes      Alcohol/week: 6.0 standard drinks    Types: 6 Cans of beer per week    Comment: OCCASSIONALLY   . Drug use: No  . Sexual activity: Yes  Lifestyle  . Physical activity:    Days per week: Not on file    Minutes per session: Not on file  . Stress: Not on file  Relationships  . Social connections:    Talks on phone: Not on file    Gets together: Not on file    Attends religious service: Not on file    Active member of club or organization: Not on file    Attends meetings of clubs or organizations: Not on file    Relationship status: Not on file  Other Topics Concern  . Not on file  Social History Narrative   7 cups of caffeine a week    Past Surgical History:  Procedure Laterality Date  . BACK SURGERY    . BRAIN HEMATOMA EVACUATION  2006  . LEFT HEART CATHETERIZATION WITH CORONARY ANGIOGRAM N/A 02/17/2014   Procedure: LEFT HEART CATHETERIZATION WITH CORONARY ANGIOGRAM;  Surgeon: Lesleigh NoeHenry W Brubacher III, MD;  [facility];  Service: Cardiovascular;  Laterality: N/A;  . LUMBAR DISC SURGERY  2013  . PERCUTANEOUS  CORONARY STENT INTERVENTION (PCI-S)  02/17/2014   Procedure: PERCUTANEOUS CORONARY STENT INTERVENTION (PCI-S);  Surgeon: [name], MD;  Location: [facility] CATH LAB;  Service: Cardiovascular;;  . TRACHEOSTOMY  05/2004  . VENTRICULOSTOMY  2006   Past Medical History:  Diagnosis Date  . Acid reflux   . 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  . Chronic lower back pain   . Headache    "monthly" (02/18/2014)  . Hypertension   . 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 Stroke [facility]) 2006   "left leg weak since" (02/18/2014)   BP 137/84   Pulse 98   SpO2 95%   Opioid Risk Score:   Fall Risk Score:  `1  Depression screen PHQ 2/9  Depression screen [facility] 2/9 08/28/2017 05/24/2017 04/24/2017 09/28/2015  Decreased Interest 0 0 0 0  Down, Depressed, Hopeless 0 0 0 0  PHQ - 2 Score 0 0 0 0     Review of Systems  Constitutional: Negative.   HENT: Negative.   Eyes: Negative.   Respiratory: Negative.   Cardiovascular: Negative.   Gastrointestinal: Positive for vomiting.  Endocrine: Negative.   Genitourinary: Negative.   Musculoskeletal: Negative.   Skin: Negative.   Allergic/Immunologic: Negative.   Neurological: Negative.   Psychiatric/Behavioral: Negative.   All other systems reviewed and are negative.      Objective:   Physical Exam Vitals signs and nursing note reviewed.  Constitutional:      Appearance: Normal appearance.  Neck:     Musculoskeletal: Normal range of motion and neck supple.  Cardiovascular:     Rate and Rhythm: Normal rate and regular rhythm.  Pulmonary:     Effort: Pulmonary effort is normal.     Breath sounds: Normal breath sounds.  Musculoskeletal:     Comments: Normal Muscle Bulk and Muscle Testing Reveals : Upper Extremities: Full ROM and Muscle Strength on Right:  5/5  And Left: 4/5  Lumbar Paraspinal Tenderness: L-3-L-5  Lower Extremities: Full ROM and Muscle Strength 5/5 Arises from Table slowly using cane for support Narrow Based Gait   Skin:    General: Skin is warm and dry.  Neurological:     Mental Status: He is alert and oriented to person, place, and time.  Psychiatric:        Mood and Affect: Mood normal.        Behavior: Behavior normal.           Assessment & Plan:  1. History of multiple strokes with gait disorder, ongoing ataxia andspasticleft hemiparesis: ContinueHEP as Tolerated. 03/20/2018 2. Chronic low back pain with documented facet arthropathy and history of intradural defect/schwanomma requiring resection, per Dr. Riley Kill Note. Continue current medication regimen. Refilled:Percocet10/325 one q6 prn #120.03/20/2018 We will continue the opioid monitoring program, this consists of regular clinic visits, examinations, routine drug screening, pill counts as well as use of [place] Controlled  Substance Reporting System. [place]CCSRS was reviewed  20 minutes of face to face patient care time was spent during this visit. All questions were encouraged and answered.  F/U in 1 month

## 2018-03-25 ENCOUNTER — Telehealth: Payer: Self-pay | Admitting: *Deleted

## 2018-03-25 LAB — DRUG TOX MONITOR 1 W/CONF, ORAL FLD
Amphetamines: NEGATIVE ng/mL (ref <10)
BARBITURATES: NEGATIVE ng/mL (ref <10)
BENZODIAZEPINES: NEGATIVE ng/mL (ref <0.50)
BUPRENORPHINE: NEGATIVE ng/mL (ref <0.10)
Cocaine: NEGATIVE ng/mL (ref <5.0)
FENTANYL: NEGATIVE ng/mL (ref <0.10)
HEROIN METABOLITE: NEGATIVE ng/mL (ref <1.0)
MARIJUANA: NEGATIVE ng/mL (ref <2.5)
MDMA: NEGATIVE ng/mL (ref <10)
METHADONE: NEGATIVE ng/mL (ref <5.0)
Meprobamate: NEGATIVE ng/mL (ref <2.5)
NICOTINE METABOLITE: NEGATIVE ng/mL (ref <5.0)
Opiates: NEGATIVE ng/mL (ref <2.5)
Phencyclidine: NEGATIVE ng/mL (ref <10)
TAPENTADOL: NEGATIVE ng/mL (ref <5.0)
TRAMADOL: NEGATIVE ng/mL (ref <5.0)
Zolpidem: NEGATIVE ng/mL (ref <5.0)

## 2018-03-25 LAB — DRUG TOX ALC METAB W/CON, ORAL FLD: ALCOHOL METABOLITE: NEGATIVE ng/mL (ref <25)

## 2018-03-25 NOTE — Telephone Encounter (Signed)
Oral drug swab is negative for medications.  His last fill date was 02/19/18. 30 days would be 03/20/18 which is the day of the test.  He reported his last pill taken 03/19/18 pm which would mean he was out early, and even if his last pill was the night before you would expect to find metabolites present, and there were none. Therefore this would be considered inconsistent. His previous swab was negative but reported he had been out of medication 2-3 days.

## 2018-04-16 ENCOUNTER — Encounter: Payer: Medicaid Other | Attending: Physical Medicine & Rehabilitation | Admitting: Registered Nurse

## 2018-04-16 ENCOUNTER — Encounter: Payer: Self-pay | Admitting: Registered Nurse

## 2018-04-16 VITALS — BP 121/80 | HR 91 | Resp 14 | Ht 67.0 in | Wt 195.0 lb

## 2018-04-16 DIAGNOSIS — I10 Essential (primary) hypertension: Secondary | ICD-10-CM | POA: Diagnosis not present

## 2018-04-16 DIAGNOSIS — Z5181 Encounter for therapeutic drug level monitoring: Secondary | ICD-10-CM

## 2018-04-16 DIAGNOSIS — R51 Headache: Secondary | ICD-10-CM | POA: Diagnosis not present

## 2018-04-16 DIAGNOSIS — M47816 Spondylosis without myelopathy or radiculopathy, lumbar region: Secondary | ICD-10-CM | POA: Diagnosis not present

## 2018-04-16 DIAGNOSIS — I69393 Ataxia following cerebral infarction: Secondary | ICD-10-CM | POA: Insufficient documentation

## 2018-04-16 DIAGNOSIS — G8114 Spastic hemiplegia affecting left nondominant side: Secondary | ICD-10-CM | POA: Diagnosis not present

## 2018-04-16 DIAGNOSIS — M545 Low back pain: Secondary | ICD-10-CM | POA: Diagnosis not present

## 2018-04-16 DIAGNOSIS — K219 Gastro-esophageal reflux disease without esophagitis: Secondary | ICD-10-CM | POA: Diagnosis not present

## 2018-04-16 DIAGNOSIS — I69354 Hemiplegia and hemiparesis following cerebral infarction affecting left non-dominant side: Secondary | ICD-10-CM | POA: Insufficient documentation

## 2018-04-16 DIAGNOSIS — I69314 Frontal lobe and executive function deficit following cerebral infarction: Secondary | ICD-10-CM | POA: Insufficient documentation

## 2018-04-16 DIAGNOSIS — Z79891 Long term (current) use of opiate analgesic: Secondary | ICD-10-CM | POA: Diagnosis not present

## 2018-04-16 DIAGNOSIS — G894 Chronic pain syndrome: Secondary | ICD-10-CM

## 2018-04-16 DIAGNOSIS — G8929 Other chronic pain: Secondary | ICD-10-CM | POA: Diagnosis not present

## 2018-04-16 MED ORDER — OXYCODONE-ACETAMINOPHEN 10-325 MG PO TABS: 1.0000 | ORAL_TABLET | Freq: Four times a day (QID) | ORAL | 0 refills | Status: DC | PRN

## 2018-04-16 NOTE — Progress Notes (Signed)
Subjective:    Patient ID: Pricilla Larssonnthony G Katen, male    DOB: Dec 20, 1965, 53 y.o.   MRN: [id number]  HPI: Pricilla Larssonnthony G Henricksen is a 53 y.o. male who returns for follow up appointment for chronic pain and medication refill. He states his pain is located in his lower back. He rates his pain 7. His current exercise regime is walking.   [name] Morphine equivalent is 60.00 MME.  [name] last Oral swab was negative for his medication. UDS was ordered today, [name] reports he has urinary retention, he is willing to try to void. He went to [facility] and was unable to void. He asked if he could have his blood drawn, explained to [name] we are unable to perform a venous blood test at this time. Oral Swab was perform today, will await his results and the above will be discussed with Dr. Riley KillSwartz, he verbalizes understanding.    Pain Inventory Average Pain 7 Pain Right Now 7 My pain is intermittent  In the last 24 hours, has pain interfered with the following? General activity 7 Relation with others 7 Enjoyment of life 7 What TIME of day is your pain at its worst? night Sleep (in general) Fair  Pain is worse with: walking, bending, sitting, inactivity, standing and some activites Pain improves with: medication Relief from Meds: 10  Mobility walk with assistance use a cane how many minutes can you walk? 10 ability to climb steps?  yes do you drive?  yes Do you have any goals in this area?  yes  Function disabled: date disabled .  Neuro/Psych trouble walking  Prior Studies Any changes since last visit?  no  Physicians involved in your care Any changes since last visit?  no   Family History  Problem Relation Age of Onset  . Heart Problems Father   . Hypertension Mother   . Stroke Other    Social History   Socioeconomic History  . Marital status: Single    Spouse name: Not on file  . Number of children: Not on file  . Years of education: Not on file  . Highest education  level: Not on file  Occupational History  . Occupation: umemployed     Comment: Works in [employer]   Social Needs  . Financial resource strain: Not on file  . Food insecurity:    Worry: Not on file    Inability: Not on file  . Transportation needs:    Medical: Not on file    Non-medical: Not on file  Tobacco Use  . Smoking status: Never Smoker  . Smokeless tobacco: Never Used  Substance and Sexual Activity  . Alcohol use: Yes    Alcohol/week: 6.0 standard drinks    Types: 6 Cans of beer per week    Comment: OCCASSIONALLY   . Drug use: No  . Sexual activity: Yes  Lifestyle  . Physical activity:    Days per week: Not on file    Minutes per session: Not on file  . Stress: Not on file  Relationships  . Social connections:    Talks on phone: Not on file    Gets together: Not on file    Attends religious service: Not on file    Active member of club or organization: Not on file    Attends meetings of clubs or organizations: Not on file    Relationship status: Not on file  Other Topics Concern  . Not on file  Social History Narrative   7 cups of caffeine a week    Past Surgical History:  Procedure Laterality Date  . BACK SURGERY    . BRAIN HEMATOMA EVACUATION  2006  . LEFT HEART CATHETERIZATION WITH CORONARY ANGIOGRAM N/A 02/17/2014   Procedure: LEFT HEART CATHETERIZATION WITH CORONARY ANGIOGRAM;  Surgeon: [name], MD;  Location: [facility] CATH LAB;  Service: Cardiovascular;  Laterality: N/A;  . LUMBAR DISC SURGERY  2013  . PERCUTANEOUS CORONARY STENT INTERVENTION (PCI-S)  02/17/2014   Procedure: PERCUTANEOUS CORONARY STENT INTERVENTION (PCI-S);  Surgeon: [name], MD;  Location: [facility] CATH LAB;  Service: Cardiovascular;;  . TRACHEOSTOMY  05/2004  . VENTRICULOSTOMY  2006   Past Medical History:  Diagnosis Date  . Acid reflux   . 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  . Chronic lower back pain    . Headache    "monthly" (02/18/2014)  . Hypertension   . 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 Stroke [facility]) 2006   "left leg weak since" (02/18/2014)   BP 121/80   Pulse 91   Resp 14   Ht 5\' 7"  (1.702 m)   Wt 195 lb (88.5 kg)   SpO2 95%   BMI 30.54 kg/m   Opioid Risk Score:   Fall Risk Score:  `1  Depression screen PHQ 2/9  Depression screen [facility] 2/9 08/28/2017 05/24/2017 04/24/2017 09/28/2015  Decreased Interest 0 0 0 0  Down, Depressed, Hopeless 0 0 0 0  PHQ - 2 Score 0 0 0 0    Review of Systems  Constitutional: Negative.   HENT: Negative.   Eyes: Negative.   Respiratory: Negative.   Cardiovascular: Negative.   Gastrointestinal: Negative.   Endocrine: Negative.   Genitourinary: Negative.   Musculoskeletal: Positive for back pain and gait problem.  Skin: Negative.   Allergic/Immunologic: Negative.   Hematological: Negative.   Psychiatric/Behavioral: Negative.   All other systems reviewed and are negative.      Objective:   Physical Exam Vitals signs and nursing note reviewed.  Constitutional:      Appearance: Normal appearance.  Neck:     Musculoskeletal: Normal range of motion and neck supple.  Cardiovascular:     Rate and Rhythm: Normal rate and regular rhythm.     Pulses: Normal pulses.     Heart sounds: Normal heart sounds.  Pulmonary:     Effort: Pulmonary effort is normal.     Breath sounds: Normal breath sounds.  Musculoskeletal:     Comments: Normal Muscle Bulk and Muscle Testing Reveals:  Upper Extremities: Full  ROM and Muscle Strength 5/5 Lumbar Paraspinal Tenderness: L-3-L-5 Lower Extremities: Full ROM and Muscle Strength 5/5 Arises from Table Slowly using cane for support Antalgic Gait   Skin:    General: Skin is warm and dry.  Neurological:     Mental Status: He is alert and oriented to person, place, and time.  Psychiatric:        Mood and Affect: Mood normal.        Behavior: Behavior normal.             Assessment & Plan:  1. History of multiple strokes with gait disorder, ongoing ataxia andspasticleft hemiparesis: ContinueHEP as Tolerated. 04/16/2018 2. Chronic low back pain with documented facet arthropathy and history of intradural defect/schwanomma requiring resection, per Dr. Riley Kill Note. Continue current medication regimen. Refilled:Percocet10/325 one q6 prn #120.04/16/2018 We  will continue the opioid monitoring program, this consists of regular clinic visits, examinations, routine drug screening, pill counts as well as use of [place] Controlled Substance Reporting System. [place]CC[place] was reviewed  20 minutes of face to face patient care time was spent during this visit. All questions were encouraged and answered.  F/U in 1 month

## 2018-04-21 LAB — DRUG TOX MONITOR 1 W/CONF, ORAL FLD
Amphetamines: NEGATIVE ng/mL (ref <10)
Barbiturates: NEGATIVE ng/mL (ref <10)
Benzodiazepines: NEGATIVE ng/mL (ref <0.50)
Buprenorphine: NEGATIVE ng/mL (ref <0.10)
COCAINE: NEGATIVE ng/mL (ref <5.0)
Codeine: NEGATIVE ng/mL (ref <2.5)
DIHYDROCODEINE: NEGATIVE ng/mL (ref <2.5)
Fentanyl: NEGATIVE ng/mL (ref <0.10)
HYDROCODONE: NEGATIVE ng/mL (ref <2.5)
Heroin Metabolite: NEGATIVE ng/mL (ref <1.0)
Hydromorphone: NEGATIVE ng/mL (ref <2.5)
MARIJUANA: NEGATIVE ng/mL (ref <2.5)
MDMA: NEGATIVE ng/mL (ref <10)
Meprobamate: NEGATIVE ng/mL (ref <2.5)
Methadone: NEGATIVE ng/mL (ref <5.0)
Morphine: NEGATIVE ng/mL (ref <2.5)
Nicotine Metabolite: NEGATIVE ng/mL (ref <5.0)
Norhydrocodone: NEGATIVE ng/mL (ref <2.5)
Noroxycodone: 11.5 ng/mL — ABNORMAL HIGH (ref <2.5)
Opiates: POSITIVE ng/mL — AB (ref <2.5)
Oxycodone: 50.3 ng/mL — ABNORMAL HIGH (ref <2.5)
Oxymorphone: NEGATIVE ng/mL (ref <2.5)
Phencyclidine: NEGATIVE ng/mL (ref <10)
Tapentadol: NEGATIVE ng/mL (ref <5.0)
Tramadol: NEGATIVE ng/mL (ref <5.0)
ZOLPIDEM: NEGATIVE ng/mL (ref <5.0)

## 2018-04-21 LAB — DRUG TOX ALC METAB W/CON, ORAL FLD: Alcohol Metabolite: NEGATIVE ng/mL (ref <25)

## 2018-04-22 ENCOUNTER — Telehealth: Payer: Self-pay | Admitting: *Deleted

## 2018-04-22 NOTE — Telephone Encounter (Signed)
Oral swab drug screen was consistent for prescribed medications.  ?

## 2018-05-16 ENCOUNTER — Encounter: Payer: Self-pay | Admitting: Registered Nurse

## 2018-05-16 ENCOUNTER — Encounter: Payer: Medicaid Other | Attending: Physical Medicine & Rehabilitation | Admitting: Registered Nurse

## 2018-05-16 VITALS — BP 131/91 | HR 84 | Ht 67.0 in | Wt 199.0 lb

## 2018-05-16 DIAGNOSIS — I10 Essential (primary) hypertension: Secondary | ICD-10-CM | POA: Insufficient documentation

## 2018-05-16 DIAGNOSIS — I69354 Hemiplegia and hemiparesis following cerebral infarction affecting left non-dominant side: Secondary | ICD-10-CM | POA: Diagnosis not present

## 2018-05-16 DIAGNOSIS — Z79891 Long term (current) use of opiate analgesic: Secondary | ICD-10-CM

## 2018-05-16 DIAGNOSIS — M545 Low back pain: Secondary | ICD-10-CM | POA: Insufficient documentation

## 2018-05-16 DIAGNOSIS — R51 Headache: Secondary | ICD-10-CM | POA: Insufficient documentation

## 2018-05-16 DIAGNOSIS — I69393 Ataxia following cerebral infarction: Secondary | ICD-10-CM | POA: Insufficient documentation

## 2018-05-16 DIAGNOSIS — M47816 Spondylosis without myelopathy or radiculopathy, lumbar region: Secondary | ICD-10-CM

## 2018-05-16 DIAGNOSIS — I69314 Frontal lobe and executive function deficit following cerebral infarction: Secondary | ICD-10-CM | POA: Diagnosis not present

## 2018-05-16 DIAGNOSIS — G8929 Other chronic pain: Secondary | ICD-10-CM | POA: Insufficient documentation

## 2018-05-16 DIAGNOSIS — G894 Chronic pain syndrome: Secondary | ICD-10-CM | POA: Diagnosis not present

## 2018-05-16 DIAGNOSIS — G8114 Spastic hemiplegia affecting left nondominant side: Secondary | ICD-10-CM

## 2018-05-16 DIAGNOSIS — Z5181 Encounter for therapeutic drug level monitoring: Secondary | ICD-10-CM

## 2018-05-16 DIAGNOSIS — K219 Gastro-esophageal reflux disease without esophagitis: Secondary | ICD-10-CM | POA: Diagnosis not present

## 2018-05-16 MED ORDER — OXYCODONE-ACETAMINOPHEN 10-325 MG PO TABS: 1.0000 | ORAL_TABLET | Freq: Four times a day (QID) | ORAL | 0 refills | Status: DC | PRN

## 2018-05-16 NOTE — Progress Notes (Signed)
Subjective:    Patient ID: George Villa, male    DOB: Sep 21, 1965, 53 y.o.   MRN: [id number]  HPI: George Villa is a 53 y.o. male who returns for follow up appointment for chronic pain and medication refill. He states his pain is located in his lower back. He rates his pain 7. His current exercise regime is walking and riding his stationary bicycle 1 mile a day.    [name] Morphine equivalent is 60.00 MME.  Last Oral Swab was Performed on 04/16/2018, it was consistent.  Pain Inventory Average Pain 6 Pain Right Now 7 My pain is dull  In the last 24 hours, has pain interfered with the following? General activity 5 Relation with others 5 Enjoyment of life 5 What TIME of day is your pain at its worst? night Sleep (in general) Fair  Pain is worse with: bending and standing Pain improves with: medication Relief from Meds: 7  Mobility use a cane do you drive?  yes  Function disabled: date disabled .  Neuro/Psych weakness  Prior Studies Any changes since last visit?  no  Physicians involved in your care Any changes since last visit?  no   Family History  Problem Relation Age of Onset  . Heart Problems Father   . Hypertension Mother   . Stroke Other    Social History   Socioeconomic History  . Marital status: Single    Spouse name: Not on file  . Number of children: Not on file  . Years of education: Not on file  . Highest education level: Not on file  Occupational History  . Occupation: umemployed     Comment: Works in [employer]   Social Needs  . Financial resource strain: Not on file  . Food insecurity:    Worry: Not on file    Inability: Not on file  . Transportation needs:    Medical: Not on file    Non-medical: Not on file  Tobacco Use  . Smoking status: Never Smoker  . Smokeless tobacco: Never Used  Substance and Sexual Activity  . Alcohol use: Yes    Alcohol/week: 6.0 standard drinks    Types: 6 Cans of beer per week    Comment:  OCCASSIONALLY   . Drug use: No  . Sexual activity: Yes  Lifestyle  . Physical activity:    Days per week: Not on file    Minutes per session: Not on file  . Stress: Not on file  Relationships  . Social connections:    Talks on phone: Not on file    Gets together: Not on file    Attends religious service: Not on file    Active member of club or organization: Not on file    Attends meetings of clubs or organizations: Not on file    Relationship status: Not on file  Other Topics Concern  . Not on file  Social History Narrative   7 cups of caffeine a week    Past Surgical History:  Procedure Laterality Date  . BACK SURGERY    . BRAIN HEMATOMA EVACUATION  2006  . LEFT HEART CATHETERIZATION WITH CORONARY ANGIOGRAM N/A 02/17/2014   Procedure: LEFT HEART CATHETERIZATION WITH CORONARY ANGIOGRAM;  Surgeon: Lesleigh NoeHenry W Murthy III, MD;  [facility];  Service: Cardiovascular;  Laterality: N/A;  . LUMBAR DISC SURGERY  2013  . PERCUTANEOUS CORONARY STENT INTERVENTION (PCI-S)  02/17/2014   Procedure: PERCUTANEOUS CORONARY STENT INTERVENTION (PCI-S);  Surgeon: Sherilyn CooterHenry  Jamey Reas, MD;  Location: [facility] CATH LAB;  Service: Cardiovascular;;  . TRACHEOSTOMY  05/2004  . VENTRICULOSTOMY  2006   Past Medical History:  Diagnosis Date  . Acid reflux   . 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  . Chronic lower back pain   . Headache    "monthly" (02/18/2014)  . Hypertension   . 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 Stroke [facility]) 2006   "left leg weak since" (02/18/2014)   BP (!) 131/91   Pulse 84   Ht 5\' 7"  (1.702 m)   Wt 199 lb (90.3 kg)   SpO2 97%   BMI 31.17 kg/m   Opioid Risk Score:   Fall Risk Score:  `1  Depression screen PHQ 2/9  Depression screen [facility] 2/9 08/28/2017 05/24/2017 04/24/2017 09/28/2015  Decreased Interest 0 0 0 0  Down, Depressed, Hopeless 0 0 0 0  PHQ - 2 Score 0 0 0 0     Review of  Systems  Constitutional: Negative.   HENT: Negative.   Eyes: Negative.   Respiratory: Negative.   Cardiovascular: Negative.   Gastrointestinal: Negative.   Endocrine: Negative.   Genitourinary: Negative.   Musculoskeletal: Positive for arthralgias, gait problem and myalgias.  Skin: Negative.   Allergic/Immunologic: Negative.   Neurological: Positive for weakness.  Hematological: Bruises/bleeds easily.  Psychiatric/Behavioral: Negative.   All other systems reviewed and are negative.      Objective:   Physical Exam Vitals signs and nursing note reviewed.  Constitutional:      Appearance: Normal appearance.  Neck:     Musculoskeletal: Normal range of motion and neck supple.  Cardiovascular:     Rate and Rhythm: Normal rate and regular rhythm.     Pulses: Normal pulses.     Heart sounds: Normal heart sounds.  Pulmonary:     Effort: Pulmonary effort is normal.     Breath sounds: Normal breath sounds.  Musculoskeletal:     Comments: Normal Muscle Bulk and Muscle Testing Reveals:  Upper Extremities: Full ROM and Muscle Strength 5/5 Lumbar Paraspinal Tenderness: L-4-L-5 Lower Extremities: Full ROM and Muscle Strength 5/5 Arises from Table Slowly using cane for support Antalgic Gait   Skin:    General: Skin is warm and dry.  Neurological:     Mental Status: He is alert and oriented to person, place, and time.  Psychiatric:        Mood and Affect: Mood normal.        Behavior: Behavior normal.           Assessment & Plan:  1. History of multiple strokes with gait disorder, ongoing ataxia andspasticleft hemiparesis: ContinueHEP as Tolerated. 05/16/2018 2. Chronic low back pain with documented facet arthropathy and history of intradural defect/schwanomma requiring resection, per Dr. Riley Kill Note. Continue current medication regimen. Refilled:Percocet10/325 one q6 prn #120.05/16/2018 We will continue the opioid monitoring program, this consists of regular clinic  visits, examinations, routine drug screening, pill counts as well as use of [place] Controlled Substance Reporting System. NCCSRS was reviewed  20 minutes of face to face patient care time was spent during this visit. All questions were encouraged and answered.  F/U in 1 month

## 2018-06-02 DIAGNOSIS — I639 Cerebral infarction, unspecified: Secondary | ICD-10-CM | POA: Diagnosis not present

## 2018-06-02 DIAGNOSIS — K219 Gastro-esophageal reflux disease without esophagitis: Secondary | ICD-10-CM | POA: Diagnosis not present

## 2018-06-02 DIAGNOSIS — E789 Disorder of lipoprotein metabolism, unspecified: Secondary | ICD-10-CM | POA: Diagnosis not present

## 2018-06-02 DIAGNOSIS — I10 Essential (primary) hypertension: Secondary | ICD-10-CM | POA: Diagnosis not present

## 2018-06-16 ENCOUNTER — Encounter: Payer: Self-pay | Admitting: Physical Medicine & Rehabilitation

## 2018-06-16 ENCOUNTER — Encounter: Payer: Medicaid Other | Attending: Physical Medicine & Rehabilitation | Admitting: Physical Medicine & Rehabilitation

## 2018-06-16 VITALS — BP 131/88 | HR 102 | Ht 67.0 in | Wt 198.0 lb

## 2018-06-16 DIAGNOSIS — M47816 Spondylosis without myelopathy or radiculopathy, lumbar region: Secondary | ICD-10-CM | POA: Diagnosis not present

## 2018-06-16 DIAGNOSIS — I10 Essential (primary) hypertension: Secondary | ICD-10-CM | POA: Diagnosis not present

## 2018-06-16 DIAGNOSIS — I69314 Frontal lobe and executive function deficit following cerebral infarction: Secondary | ICD-10-CM | POA: Diagnosis not present

## 2018-06-16 DIAGNOSIS — G8929 Other chronic pain: Secondary | ICD-10-CM | POA: Diagnosis not present

## 2018-06-16 DIAGNOSIS — R269 Unspecified abnormalities of gait and mobility: Secondary | ICD-10-CM | POA: Diagnosis not present

## 2018-06-16 DIAGNOSIS — R51 Headache: Secondary | ICD-10-CM | POA: Insufficient documentation

## 2018-06-16 DIAGNOSIS — G8114 Spastic hemiplegia affecting left nondominant side: Secondary | ICD-10-CM

## 2018-06-16 DIAGNOSIS — K219 Gastro-esophageal reflux disease without esophagitis: Secondary | ICD-10-CM | POA: Insufficient documentation

## 2018-06-16 DIAGNOSIS — I69393 Ataxia following cerebral infarction: Secondary | ICD-10-CM | POA: Insufficient documentation

## 2018-06-16 DIAGNOSIS — I69354 Hemiplegia and hemiparesis following cerebral infarction affecting left non-dominant side: Secondary | ICD-10-CM | POA: Insufficient documentation

## 2018-06-16 DIAGNOSIS — M545 Low back pain: Secondary | ICD-10-CM | POA: Insufficient documentation

## 2018-06-16 MED ORDER — OXYCODONE-ACETAMINOPHEN 10-325 MG PO TABS: 1.0000 | ORAL_TABLET | Freq: Four times a day (QID) | ORAL | 0 refills | Status: DC | PRN

## 2018-06-16 NOTE — Patient Instructions (Signed)
PLEASE FEEL FREE TO CALL OUR OFFICE WITH ANY PROBLEMS OR QUESTIONS ([phone])      

## 2018-06-16 NOTE — Progress Notes (Signed)
Subjective:    Patient ID: George Villa, male    DOB: 06/10/1965, 53 y.o.   MRN: [id number]  HPI   George Villa is here in follow up of his chronic pain.  For the most part he has been doing fairly well at home.  He tries to stay active using his stationary bicycle every day.  He tells me stretching every day as well.  Denies any recent mishaps or falls.  Uses a cane for balance.  He remains on Percocet 10/325 1 every 6 hours as needed.  He ran out of medication yesterday.  Pain is primarily centered in his low back with some radiation into the left arm and leg.  Pain Inventory Average Pain 8 Pain Right Now 8 My pain is dull  In the last 24 hours, has pain interfered with the following? General activity 6 Relation with others 6 Enjoyment of life 6 What TIME of day is your pain at its worst? evening Sleep (in general) Fair  Pain is worse with: unsure Pain improves with: medication Relief from Meds: 8  Mobility walk with assistance use a cane how many minutes can you walk? 10 ability to climb steps?  yes do you drive?  yes Do you have any goals in this area?  yes  Function disabled: date disabled . Do you have any goals in this area?  yes  Neuro/Psych trouble walking  Prior Studies Any changes since last visit?  no  Physicians involved in your care Any changes since last visit?  no   Family History  Problem Relation Age of Onset  . Heart Problems Father   . Hypertension Mother   . Stroke Other    Social History   Socioeconomic History  . Marital status: Single    Spouse name: Not on file  . Number of children: Not on file  . Years of education: Not on file  . Highest education level: Not on file  Occupational History  . Occupation: umemployed     Comment: Works in [employer]  . Financial resource strain: Not on file  . Food insecurity:    Worry: Not on file    Inability: Not on file  . Transportation needs:    Medical: Not on file    Non-medical: Not on file  Tobacco Use  . Smoking status: Never Smoker  . Smokeless tobacco: Never Used  Substance and Sexual Activity  . Alcohol use: Yes    Alcohol/week: 6.0 standard drinks    Types: 6 Cans of beer per week    Comment: OCCASSIONALLY   . Drug use: No  . Sexual activity: Yes  Lifestyle  . Physical activity:    Days per week: Not on file    Minutes per session: Not on file  . Stress: Not on file  Relationships  . Social connections:    Talks on phone: Not on file    Gets together: Not on file    Attends religious service: Not on file    Active member of club or organization: Not on file    Attends meetings of clubs or organizations: Not on file    Relationship status: Not on file  Other Topics Concern  . Not on file  Social History Narrative   7 cups of caffeine a week    Past Surgical History:  Procedure Laterality Date  . BACK SURGERY    . BRAIN HEMATOMA EVACUATION  2006  . LEFT HEART  CATHETERIZATION WITH CORONARY ANGIOGRAM N/A 02/17/2014   Procedure: LEFT HEART CATHETERIZATION WITH CORONARY ANGIOGRAM;  Surgeon: [name], MD;  Location: [facility] CATH LAB;  Service: Cardiovascular;  Laterality: N/A;  . LUMBAR DISC SURGERY  2013  . PERCUTANEOUS CORONARY STENT INTERVENTION (PCI-S)  02/17/2014   Procedure: PERCUTANEOUS CORONARY STENT INTERVENTION (PCI-S);  Surgeon: [name], MD;  Location: [facility] CATH LAB;  Service: Cardiovascular;;  . TRACHEOSTOMY  05/2004  . VENTRICULOSTOMY  2006   Past Medical History:  Diagnosis Date  . Acid reflux   . 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  . Chronic lower back pain   . Headache    "monthly" (02/18/2014)  . Hypertension   . 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 Stroke [facility]) 2006   "left leg weak since" (02/18/2014)   BP 131/88   Pulse (!) 102   Ht 5\' 7"  (1.702 m)   Wt 198 lb (89.8 kg)   SpO2 94%   BMI 31.01 kg/m    Opioid Risk Score:   Fall Risk Score:  `1  Depression screen PHQ 2/9  Depression screen [facility] 2/9 08/28/2017 05/24/2017 04/24/2017 09/28/2015  Decreased Interest 0 0 0 0  Down, Depressed, Hopeless 0 0 0 0  PHQ - 2 Score 0 0 0 0    Review of Systems  Constitutional: Negative.   HENT: Negative.   Eyes: Negative.   Respiratory: Negative.   Cardiovascular: Negative.   Gastrointestinal: Negative.   Endocrine:       High blood sugar  Genitourinary: Positive for difficulty urinating.  Musculoskeletal: Positive for back pain and gait problem.  Skin: Negative.   Allergic/Immunologic: Negative.   Hematological: Negative.   Psychiatric/Behavioral: Negative.        Objective:   Physical Exam General: No acute distress HEENT: EOMI, oral membranes moist Cards: reg rate  Chest: normal effort Abdomen: Soft, NT, ND Skin: dry, intact Extremities: no edema  Neuro:Pt is cognitively appropriate with normal insight, memory, and awareness. Cranial nerves 2-12 are intact. Sensory exam is normal. Reflexes are 2+ in all 4's. Fine motor coordination is intact. No tremors. Motor function is grossly 5/5 RUE and RLE.  Left upper extremity is 4 out of 5 left lower extremity is 3+ to 4 out of 5 proximal to distal..Still some ataxia in the left upper and left lower extremities.. Tr to 1/4 tone LLE.Still favors left leg during gait.  Musculoskeletal:LBP TTP.  He can bend to around 60 degrees.  Has more pain with extension and flexion Psych: Pleasant and cooperative     Assessment & Plan:  1. History of multiple strokes with gait disorder, ongoing ataxia andspasticleft hemiparesis 2. Chronic low back pain with documented facet arthropathy and history of intradural defect/schwanomma requiring resection.mechanical component to pain as well  Plan:  1.Continuebaclofen 10mg  TID to addres LBP and left sided tone 2.Continue with HEP, discussed mechanics and posture as  well 3.Percocet10/325 one q6 prn #120.   -this rx remains efficacious for him  -We will continue the controlled substance monitoring program, this consists of regular clinic visits, examinations, routine drug screening, pill counts as well as use of [place] Controlled Substance Reporting System. [place] was reviewed today.   4. Cane for safety  Follow up in aboutone month with nurse practitioner. of face to face patient care time were spent during this visit. All questions were encouraged and answered.  Assessment & Plan:

## 2018-07-17 ENCOUNTER — Other Ambulatory Visit: Payer: Self-pay

## 2018-07-17 ENCOUNTER — Encounter: Payer: Medicaid Other | Attending: Physical Medicine & Rehabilitation | Admitting: Registered Nurse

## 2018-07-17 ENCOUNTER — Encounter: Payer: Self-pay | Admitting: Registered Nurse

## 2018-07-17 VITALS — BP 120/80 | Ht 67.0 in | Wt 198.0 lb

## 2018-07-17 DIAGNOSIS — I10 Essential (primary) hypertension: Secondary | ICD-10-CM | POA: Insufficient documentation

## 2018-07-17 DIAGNOSIS — I69314 Frontal lobe and executive function deficit following cerebral infarction: Secondary | ICD-10-CM | POA: Insufficient documentation

## 2018-07-17 DIAGNOSIS — I69393 Ataxia following cerebral infarction: Secondary | ICD-10-CM | POA: Insufficient documentation

## 2018-07-17 DIAGNOSIS — G8929 Other chronic pain: Secondary | ICD-10-CM | POA: Insufficient documentation

## 2018-07-17 DIAGNOSIS — Z5181 Encounter for therapeutic drug level monitoring: Secondary | ICD-10-CM

## 2018-07-17 DIAGNOSIS — R269 Unspecified abnormalities of gait and mobility: Secondary | ICD-10-CM

## 2018-07-17 DIAGNOSIS — G894 Chronic pain syndrome: Secondary | ICD-10-CM

## 2018-07-17 DIAGNOSIS — G8114 Spastic hemiplegia affecting left nondominant side: Secondary | ICD-10-CM | POA: Diagnosis not present

## 2018-07-17 DIAGNOSIS — R51 Headache: Secondary | ICD-10-CM | POA: Insufficient documentation

## 2018-07-17 DIAGNOSIS — I69354 Hemiplegia and hemiparesis following cerebral infarction affecting left non-dominant side: Secondary | ICD-10-CM | POA: Insufficient documentation

## 2018-07-17 DIAGNOSIS — Z79891 Long term (current) use of opiate analgesic: Secondary | ICD-10-CM | POA: Diagnosis not present

## 2018-07-17 DIAGNOSIS — M47816 Spondylosis without myelopathy or radiculopathy, lumbar region: Secondary | ICD-10-CM

## 2018-07-17 DIAGNOSIS — M545 Low back pain: Secondary | ICD-10-CM | POA: Insufficient documentation

## 2018-07-17 DIAGNOSIS — K219 Gastro-esophageal reflux disease without esophagitis: Secondary | ICD-10-CM | POA: Insufficient documentation

## 2018-07-17 MED ORDER — OXYCODONE-ACETAMINOPHEN 10-325 MG PO TABS: 1.0000 | ORAL_TABLET | Freq: Four times a day (QID) | ORAL | 0 refills | Status: DC | PRN

## 2018-07-17 NOTE — Progress Notes (Signed)
Subjective:    Patient ID: George Villa, male    DOB: 05/17/1965, 53 y.o.   MRN: [id number]  HPI: George Villa is a 53 y.o. male her appointment was changed,due to national recommendations of social distancing due to COVID 19, an audio/video telehealth visit is felt to be most appropriate for this patient at this time.  See Chart message from today for the patient's consent to telehealth from [facility].     He states his pain is located in his lower back. He rates his pain 8. His current exercise regime is walking and  riding his stationary bicycle 5 days a week for 30 minutes.   George Villa Morphine equivalent is 60. . Last Oral Swab was Performed on 04/16/2018, it was consistent.   [name] asked the Health and History questions. This provider and Rhae Lerner we were speaking with the correct person using two identifiers.  Pain Inventory Average Pain 7 Pain Right Now 8 My pain is constant, dull and throbbing  In the last 24 hours, has pain interfered with the following? General activity 8 Relation with others 8 Enjoyment of life 8 What TIME of day is your pain at its worst? night Sleep (in general) Fair  Pain is worse with: walking Pain improves with: medication Relief from Meds: 4  Mobility how many minutes can you walk? 10 ability to climb steps?  yes do you drive?  yes  Function disabled: date disabled na  Neuro/Psych No problems in this area  Prior Studies Any changes since last visit?  no  Physicians involved in your care Any changes since last visit?  no   Family History  Problem Relation Age of Onset  . Heart Problems Father   . Hypertension Mother   . Stroke Other    Social History   Socioeconomic History  . Marital status: Single    Spouse name: Not on file  . Number of children: Not on file  . Years of education: Not on file  . Highest education level: Not on file  Occupational History  .  Occupation: umemployed     Comment: Works in [employer]  . Financial resource strain: Not on file  . Food insecurity:    Worry: Not on file    Inability: Not on file  . Transportation needs:    Medical: Not on file    Non-medical: Not on file  Tobacco Use  . Smoking status: Never Smoker  . Smokeless tobacco: Never Used  Substance and Sexual Activity  . Alcohol use: Yes    Alcohol/week: 6.0 standard drinks    Types: 6 Cans of beer per week    Comment: OCCASSIONALLY   . Drug use: No  . Sexual activity: Yes  Lifestyle  . Physical activity:    Days per week: Not on file    Minutes per session: Not on file  . Stress: Not on file  Relationships  . Social connections:    Talks on phone: Not on file    Gets together: Not on file    Attends religious service: Not on file    Active member of club or organization: Not on file    Attends meetings of clubs or organizations: Not on file    Relationship status: Not on file  Other Topics Concern  . Not on file  Social History Narrative   7 cups of caffeine a week    Past Surgical  History:  Procedure Laterality Date  . BACK SURGERY    . BRAIN HEMATOMA EVACUATION  2006  . LEFT HEART CATHETERIZATION WITH CORONARY ANGIOGRAM N/A 02/17/2014   Procedure: LEFT HEART CATHETERIZATION WITH CORONARY ANGIOGRAM;  Surgeon: Lesleigh NoeHenry W Janota III, MD;  [facility];  Service: Cardiovascular;  Laterality: N/A;  . LUMBAR DISC SURGERY  2013  . PERCUTANEOUS CORONARY STENT INTERVENTION (PCI-S)  02/17/2014   Procedure: PERCUTANEOUS CORONARY STENT INTERVENTION (PCI-S);  Surgeon: Lesleigh NoeHenry W Liller III, MD;  [facility];  Service: Cardiovascular;;  . TRACHEOSTOMY  05/2004  . VENTRICULOSTOMY  2006   Past Medical History:  Diagnosis Date  . Acid reflux   . 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  . Chronic lower back pain   . Headache    "monthly" (02/18/2014)  .  Hypertension   . 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 Stroke [facility](HCC) 2006   "left leg weak since" (02/18/2014)   BP 120/80 Comment: pt reported, virtual call visit  Ht 5\' 7"  (1.702 m)   Wt 198 lb (89.8 kg)   BMI 31.01 kg/m   Opioid Risk Score:   Fall Risk Score:  `1  Depression screen PHQ 2/9  [facility] 07/17/2018 08/28/2017 05/24/2017 04/24/2017 09/28/2015  Decreased Interest 0 0 0 0 0  Down, Depressed, Hopeless 0 0 0 0 0  PHQ - 2 Score 0 0 0 0 0    Review of Systems  Constitutional: Negative.   HENT: Negative.   Eyes: Negative.   Respiratory: Negative.   Cardiovascular: Negative.   Gastrointestinal: Negative.   Endocrine: Negative.   Genitourinary: Negative.   Musculoskeletal: Positive for back pain.  Skin: Negative.   Allergic/Immunologic: Negative.   Neurological: Negative.   Hematological: Negative.   Psychiatric/Behavioral: Negative.   All other systems reviewed and are negative.      Objective:   Physical Exam Vitals signs and nursing note reviewed.  Neurological:     Mental Status: He is oriented to person, place, and time.           Assessment & Plan:  1. History of multiple strokes with gait disorder, ongoing ataxia andspasticleft hemiparesis: ContinueHEP as Tolerated. 07/17/2018 2. Chronic low back pain with documented facet arthropathy and history of intradural defect/schwanomma requiring resection, per Dr. Riley KillSwartz Note. Continue current medication regimen. Refilled:Percocet10/325 one q6 prn #120.07/17/2018 We will continue the opioid monitoring program, this consists of regular clinic visits, examinations, routine drug screening, pill counts as well as use of [place] Controlled Substance Reporting System. [place]CC[place] was reviewed  F/U in 1 month Telephone Call: Location of patient:In his home Location of provider: Office Time spent on call: 10 minutes

## 2018-08-05 ENCOUNTER — Encounter: Payer: Self-pay | Admitting: Registered Nurse

## 2018-08-05 ENCOUNTER — Other Ambulatory Visit: Payer: Self-pay

## 2018-08-05 ENCOUNTER — Encounter (HOSPITAL_BASED_OUTPATIENT_CLINIC_OR_DEPARTMENT_OTHER): Payer: Medicaid Other | Admitting: Registered Nurse

## 2018-08-05 VITALS — BP 120/88 | Ht 67.0 in | Wt 198.0 lb

## 2018-08-05 DIAGNOSIS — Z5181 Encounter for therapeutic drug level monitoring: Secondary | ICD-10-CM

## 2018-08-05 DIAGNOSIS — Z79891 Long term (current) use of opiate analgesic: Secondary | ICD-10-CM

## 2018-08-05 DIAGNOSIS — M47816 Spondylosis without myelopathy or radiculopathy, lumbar region: Secondary | ICD-10-CM

## 2018-08-05 DIAGNOSIS — G8114 Spastic hemiplegia affecting left nondominant side: Secondary | ICD-10-CM | POA: Diagnosis not present

## 2018-08-05 DIAGNOSIS — G894 Chronic pain syndrome: Secondary | ICD-10-CM | POA: Diagnosis not present

## 2018-08-05 DIAGNOSIS — R269 Unspecified abnormalities of gait and mobility: Secondary | ICD-10-CM

## 2018-08-05 MED ORDER — OXYCODONE-ACETAMINOPHEN 10-325 MG PO TABS: 1.0000 | ORAL_TABLET | Freq: Four times a day (QID) | ORAL | 0 refills | Status: DC | PRN

## 2018-08-05 NOTE — Progress Notes (Signed)
Subjective:    Patient ID: George Villa, male    DOB: 12/15/1965, 53 y.o.   MRN: [id number]  HPI: George Villa is a 53 y.o. male his appointment was changed, due to national recommendations of social distancing due to COVID 19, an audio/video telehealth visit is felt to be most appropriate for this patient at this time.  See Chart message from today for the patient's consent to telehealth from [facility].    He  states his pain is located in his lower back. He rates his pain 7. His current exercise regime is walking, riding his stationary bicycle for 30 minutes daily and performing household chores.   George Villa Morphine equivalent is 60. .  Last Oral Swab was Performed on 04/16/2018, it was consistent.   George Villa asked the Health and History Questions. This provider and George Villa  verified we were speaking with the correct person using two identifiers.  Pain Inventory Average Pain 7 Pain Right Now 7 My pain is constant, dull and throbbing  In the last 24 hours, has pain interfered with the following? General activity 7 Relation with others 7 Enjoyment of life 7 What TIME of day is your pain at its worst? night Sleep (in general) Fair  Pain is worse with: walking Pain improves with: medication Relief from Meds: 4  Mobility how many minutes can you walk? 10 ability to climb steps?  yes do you drive?  yes  Function disabled: date disabled na  Neuro/Psych No problems in this area  Prior Studies Any changes since last visit?  no  Physicians involved in your care Any changes since last visit?  no   Family History  Problem Relation Age of Onset  . Heart Problems Father   . Hypertension Mother   . Stroke Other    Social History   Socioeconomic History  . Marital status: Single    Spouse name: Not on file  . Number of children: Not on file  . Years of education: Not on file  . Highest education level: Not on  file  Occupational History  . Occupation: umemployed     Comment: Works in [employer]  . Financial resource strain: Not on file  . Food insecurity:    Worry: Not on file    Inability: Not on file  . Transportation needs:    Medical: Not on file    Non-medical: Not on file  Tobacco Use  . Smoking status: Never Smoker  . Smokeless tobacco: Never Used  Substance and Sexual Activity  . Alcohol use: Yes    Alcohol/week: 6.0 standard drinks    Types: 6 Cans of beer per week    Comment: OCCASSIONALLY   . Drug use: No  . Sexual activity: Yes  Lifestyle  . Physical activity:    Days per week: Not on file    Minutes per session: Not on file  . Stress: Not on file  Relationships  . Social connections:    Talks on phone: Not on file    Gets together: Not on file    Attends religious service: Not on file    Active member of club or organization: Not on file    Attends meetings of clubs or organizations: Not on file    Relationship status: Not on file  Other Topics Concern  . Not on file  Social History Narrative   7 cups of caffeine a week  Past Surgical History:  Procedure Laterality Date  . BACK SURGERY    . BRAIN HEMATOMA EVACUATION  2006  . LEFT HEART CATHETERIZATION WITH CORONARY ANGIOGRAM N/A 02/17/2014   Procedure: LEFT HEART CATHETERIZATION WITH CORONARY ANGIOGRAM;  Surgeon: George NoeHenry W Bergey III, MD;  [facility];  Service: Cardiovascular;  Laterality: N/A;  . LUMBAR DISC SURGERY  2013  . PERCUTANEOUS CORONARY STENT INTERVENTION (PCI-S)  02/17/2014   Procedure: PERCUTANEOUS CORONARY STENT INTERVENTION (PCI-S);  Surgeon: George NoeHenry W Yaffe III, MD;  [facility];  Service: Cardiovascular;;  . TRACHEOSTOMY  05/2004  . VENTRICULOSTOMY  2006   Past Medical History:  Diagnosis Date  . Acid reflux   . 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  . Chronic lower back pain   . Headache     "monthly" (02/18/2014)  . Hypertension   . 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 Stroke [facility](HCC) 2006   "left leg weak since" (02/18/2014)   BP 120/88 Comment: pt reported virtual visit  Ht 5\' 7"  (1.702 m)   Wt 198 lb (89.8 kg)   BMI 31.01 kg/m   Opioid Risk Score:   Fall Risk Score:  `1  Depression screen PHQ 2/9  Depression screen [facility] 2/9 08/05/2018 07/17/2018 08/28/2017 05/24/2017 04/24/2017 09/28/2015  Decreased Interest 0 0 0 0 0 0  Down, Depressed, Hopeless 0 0 0 0 0 0  PHQ - 2 Score 0 0 0 0 0 0    Review of Systems  Constitutional: Negative.   HENT: Negative.   Eyes: Negative.   Respiratory: Negative.   Cardiovascular: Negative.   Endocrine: Negative.   Genitourinary: Negative.   Musculoskeletal: Positive for back pain.  Skin: Negative.   Allergic/Immunologic: Negative.   Neurological: Negative.   Hematological: Negative.   Psychiatric/Behavioral: Negative.   All other systems reviewed and are negative.      Objective:   Physical Exam Vitals signs and nursing note reviewed.  Musculoskeletal:     Comments: No Physical Exam Performed: Virtual Call   Neurological:     Mental Status: He is oriented to person, place, and time.           Assessment & Plan:  1. History of multiple strokes with gait disorder, ongoing ataxia andspasticleft hemiparesis: ContinueHEP as Tolerated. 08/05/2018 2. Chronic low back pain with documented facet arthropathy and history of intradural defect/schwanomma requiring resection, per Dr. Riley KillSwartz Note. Continue current medication regimen. Refilled:Percocet10/325 one q6 prn #120.08/05/2018 We will continue the opioid monitoring program, this consists of regular clinic visits, examinations, routine drug screening, pill counts as well as use of [place] Controlled Substance Reporting System. NCC[place]RS was reviewed  Telephone Call  Location of patient: In his Home Location of provider: Office Established  patient Time spent on call: 10 minutes

## 2018-08-12 ENCOUNTER — Telehealth: Payer: Self-pay

## 2018-08-12 NOTE — Telephone Encounter (Signed)
Recieved faxed notification from patients pharmacy stated need for prior approval for his oxy-apap.  Prior auth started on 08-05-2018

## 2018-08-12 NOTE — Telephone Encounter (Signed)
Went into [employer] this morning, 08-12-2018, and recieved patients prior auth as being approved

## 2018-08-12 NOTE — Telephone Encounter (Signed)
Went into [employer] site and noted that original prior auth that was submitted was returned as denied due to no clinical notes being sent with prior auth.  Resubmitted prior authorization on line and faxed clinical documentation to [employer] as there online system will not upload clinical notes due TO A BUG THAT HAS NEVER BEEN FIXED.

## 2018-09-10 ENCOUNTER — Other Ambulatory Visit: Payer: Self-pay

## 2018-09-10 ENCOUNTER — Encounter: Payer: Medicaid Other | Attending: Physical Medicine & Rehabilitation | Admitting: Registered Nurse

## 2018-09-10 VITALS — BP 137/90 | HR 96 | Temp 97.4°F | Ht 67.0 in | Wt 196.4 lb

## 2018-09-10 DIAGNOSIS — I69393 Ataxia following cerebral infarction: Secondary | ICD-10-CM | POA: Insufficient documentation

## 2018-09-10 DIAGNOSIS — R51 Headache: Secondary | ICD-10-CM | POA: Insufficient documentation

## 2018-09-10 DIAGNOSIS — Z79891 Long term (current) use of opiate analgesic: Secondary | ICD-10-CM | POA: Diagnosis not present

## 2018-09-10 DIAGNOSIS — Z5181 Encounter for therapeutic drug level monitoring: Secondary | ICD-10-CM | POA: Diagnosis not present

## 2018-09-10 DIAGNOSIS — M545 Low back pain: Secondary | ICD-10-CM | POA: Diagnosis not present

## 2018-09-10 DIAGNOSIS — G894 Chronic pain syndrome: Secondary | ICD-10-CM

## 2018-09-10 DIAGNOSIS — M47816 Spondylosis without myelopathy or radiculopathy, lumbar region: Secondary | ICD-10-CM | POA: Diagnosis not present

## 2018-09-10 DIAGNOSIS — G8929 Other chronic pain: Secondary | ICD-10-CM | POA: Diagnosis not present

## 2018-09-10 DIAGNOSIS — K219 Gastro-esophageal reflux disease without esophagitis: Secondary | ICD-10-CM | POA: Diagnosis not present

## 2018-09-10 DIAGNOSIS — I69314 Frontal lobe and executive function deficit following cerebral infarction: Secondary | ICD-10-CM | POA: Diagnosis not present

## 2018-09-10 DIAGNOSIS — G8114 Spastic hemiplegia affecting left nondominant side: Secondary | ICD-10-CM | POA: Diagnosis not present

## 2018-09-10 DIAGNOSIS — I10 Essential (primary) hypertension: Secondary | ICD-10-CM | POA: Insufficient documentation

## 2018-09-10 DIAGNOSIS — I69354 Hemiplegia and hemiparesis following cerebral infarction affecting left non-dominant side: Secondary | ICD-10-CM | POA: Insufficient documentation

## 2018-09-10 DIAGNOSIS — R269 Unspecified abnormalities of gait and mobility: Secondary | ICD-10-CM

## 2018-09-10 MED ORDER — OXYCODONE-ACETAMINOPHEN 10-325 MG PO TABS: 1.0000 | ORAL_TABLET | Freq: Four times a day (QID) | ORAL | 0 refills | Status: DC | PRN

## 2018-09-10 NOTE — Progress Notes (Signed)
Subjective:    Patient ID: George Villa, male    DOB: 1966/01/16, 53 y.o.   MRN: [id number]  HPI: George Villa is a 53 y.o. male who returns for follow up appointment for chronic pain and medication refill. He states his pain is located in his lower back. He rates his pain 8. His.current exercise regime is walking and riding his stationary bicycle for 30 minutes daily.   George Villa Morphine equivalent is 60.00 MME.  Last Oral Swab was Performed on 04/16/2018, it was consistent.   Pain Inventory Average Pain 8 Pain Right Now 8 My pain is dull  In the last 24 hours, has pain interfered with the following? General activity 8 Relation with others 8 Enjoyment of life 8 What TIME of day is your pain at its worst? night Sleep (in general) Fair  Pain is worse with: sitting and standing Pain improves with: medication Relief from Meds: 9  Mobility walk with assistance use a cane how many minutes can you walk? 10 ability to climb steps?  yes do you drive?  yes  Function disabled: date disabled na  Neuro/Psych trouble walking  Prior Studies Any changes since last visit?  no  Physicians involved in your care Any changes since last visit?  no   Family History  Problem Relation Age of Onset  . Heart Problems Father   . Hypertension Mother   . Stroke Other    Social History   Socioeconomic History  . Marital status: Single    Spouse name: Not on file  . Number of children: Not on file  . Years of education: Not on file  . Highest education level: Not on file  Occupational History  . Occupation: umemployed     Comment: Works in [employer]  . Financial resource strain: Not on file  . Food insecurity:    Worry: Not on file    Inability: Not on file  . Transportation needs:    Medical: Not on file    Non-medical: Not on file  Tobacco Use  . Smoking status: Never Smoker  . Smokeless tobacco: Never Used  Substance and Sexual Activity  . Alcohol  use: Yes    Alcohol/week: 6.0 standard drinks    Types: 6 Cans of beer per week    Comment: OCCASSIONALLY   . Drug use: No  . Sexual activity: Yes  Lifestyle  . Physical activity:    Days per week: Not on file    Minutes per session: Not on file  . Stress: Not on file  Relationships  . Social connections:    Talks on phone: Not on file    Gets together: Not on file    Attends religious service: Not on file    Active member of club or organization: Not on file    Attends meetings of clubs or organizations: Not on file    Relationship status: Not on file  Other Topics Concern  . Not on file  Social History Narrative   7 cups of caffeine a week    Past Surgical History:  Procedure Laterality Date  . BACK SURGERY    . BRAIN HEMATOMA EVACUATION  2006  . LEFT HEART CATHETERIZATION WITH CORONARY ANGIOGRAM N/A 02/17/2014   Procedure: LEFT HEART CATHETERIZATION WITH CORONARY ANGIOGRAM;  Surgeon: [name], MD;  Location: [facility] CATH LAB;  Service: Cardiovascular;  Laterality: N/A;  . LUMBAR DISC SURGERY  2013  . PERCUTANEOUS CORONARY  STENT INTERVENTION (PCI-S)  02/17/2014   Procedure: PERCUTANEOUS CORONARY STENT INTERVENTION (PCI-S);  Surgeon: Lesleigh NoeHenry W Droge III, MD;  [facility];  Service: Cardiovascular;;  . TRACHEOSTOMY  05/2004  . VENTRICULOSTOMY  2006   Past Medical History:  Diagnosis Date  . Acid reflux   . 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  . Chronic lower back pain   . Headache    "monthly" (02/18/2014)  . Hypertension   . 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 Stroke [facility](HCC) 2006   "left leg weak since" (02/18/2014)   BP 137/90   Pulse 96   Temp (!) 97.4 F (36.3 C)   Ht 5\' 7"  (1.702 m)   Wt 196 lb 6.4 oz (89.1 kg)   SpO2 95%   BMI 30.76 kg/m   Opioid Risk Score:   Fall Risk Score:  `1  Depression screen PHQ 2/9  Depression screen [facility] 2/9 09/10/2018 08/05/2018  07/17/2018 08/28/2017 05/24/2017 04/24/2017 09/28/2015  Decreased Interest 0 0 0 0 0 0 0  Down, Depressed, Hopeless 0 0 0 0 0 0 0  PHQ - 2 Score 0 0 0 0 0 0 0    Review of Systems  Constitutional: Negative.   HENT: Negative.   Eyes: Negative.   Respiratory: Negative.   Cardiovascular: Negative.   Gastrointestinal: Negative.   Endocrine: Negative.   Genitourinary:       Urinary retention  Musculoskeletal: Negative.   Skin: Negative.   Allergic/Immunologic: Negative.   Neurological: Negative.   Hematological: Negative.   Psychiatric/Behavioral: Negative.   All other systems reviewed and are negative.      Objective:   Physical Exam Vitals signs and nursing note reviewed.  Constitutional:      Appearance: Normal appearance.  Neck:     Musculoskeletal: Normal range of motion and neck supple.  Cardiovascular:     Rate and Rhythm: Normal rate and regular rhythm.     Pulses: Normal pulses.     Heart sounds: Normal heart sounds.  Pulmonary:     Effort: Pulmonary effort is normal.     Breath sounds: Normal breath sounds.  Musculoskeletal:     Comments: Normal Muscle Bulk and Muscle Testing Reveals:  Upper Extremities: Full ROM and Muscle Strength 5/5 Lumbar Paraspinal Tenderness: L-4-L-5  Lower Extremities Full ROM and Muscle Strength 4/5 Arises from chair slowly using cane for support Antalgic Gait   Skin:    General: Skin is warm and dry.  Neurological:     Mental Status: He is alert and oriented to person, place, and time.  Psychiatric:        Mood and Affect: Mood normal.        Behavior: Behavior normal.           Assessment & Plan:  1. History of multiple strokes with gait disorder, ongoing ataxia andspasticleft hemiparesis: ContinueHEP as Tolerated. 09/10/2018 2. Chronic low back pain with documented facet arthropathy and history of intradural defect/schwanomma requiring resection, per Dr. Riley KillSwartz Note. Continue current medication regimen.  Refilled:Percocet10/325 one q6 prn #120.09/10/2018 We will continue the opioid monitoring program, this consists of regular clinic visits, examinations, routine drug screening, pill counts as well as use of [place] Controlled Substance Reporting System. NCCSRS was reviewed.  15 minutes of face to face patient care time was spent during this visit. All questions were encouraged and answered.  F/U in 1 month

## 2018-09-11 ENCOUNTER — Encounter: Payer: Self-pay | Admitting: Registered Nurse

## 2018-10-09 ENCOUNTER — Other Ambulatory Visit: Payer: Self-pay

## 2018-10-09 ENCOUNTER — Encounter: Payer: Medicaid Other | Attending: Physical Medicine & Rehabilitation | Admitting: Registered Nurse

## 2018-10-09 ENCOUNTER — Encounter: Payer: Self-pay | Admitting: Registered Nurse

## 2018-10-09 VITALS — BP 118/80 | HR 77 | Temp 97.9°F | Ht 67.0 in | Wt 194.0 lb

## 2018-10-09 DIAGNOSIS — G8929 Other chronic pain: Secondary | ICD-10-CM | POA: Diagnosis not present

## 2018-10-09 DIAGNOSIS — Z5181 Encounter for therapeutic drug level monitoring: Secondary | ICD-10-CM

## 2018-10-09 DIAGNOSIS — I69393 Ataxia following cerebral infarction: Secondary | ICD-10-CM | POA: Insufficient documentation

## 2018-10-09 DIAGNOSIS — I69314 Frontal lobe and executive function deficit following cerebral infarction: Secondary | ICD-10-CM | POA: Diagnosis not present

## 2018-10-09 DIAGNOSIS — K219 Gastro-esophageal reflux disease without esophagitis: Secondary | ICD-10-CM | POA: Diagnosis not present

## 2018-10-09 DIAGNOSIS — R269 Unspecified abnormalities of gait and mobility: Secondary | ICD-10-CM | POA: Diagnosis not present

## 2018-10-09 DIAGNOSIS — G894 Chronic pain syndrome: Secondary | ICD-10-CM

## 2018-10-09 DIAGNOSIS — I10 Essential (primary) hypertension: Secondary | ICD-10-CM | POA: Diagnosis not present

## 2018-10-09 DIAGNOSIS — M545 Low back pain: Secondary | ICD-10-CM | POA: Insufficient documentation

## 2018-10-09 DIAGNOSIS — R51 Headache: Secondary | ICD-10-CM | POA: Diagnosis not present

## 2018-10-09 DIAGNOSIS — I69354 Hemiplegia and hemiparesis following cerebral infarction affecting left non-dominant side: Secondary | ICD-10-CM | POA: Insufficient documentation

## 2018-10-09 DIAGNOSIS — Z79891 Long term (current) use of opiate analgesic: Secondary | ICD-10-CM | POA: Diagnosis not present

## 2018-10-09 DIAGNOSIS — G8114 Spastic hemiplegia affecting left nondominant side: Secondary | ICD-10-CM

## 2018-10-09 DIAGNOSIS — M47816 Spondylosis without myelopathy or radiculopathy, lumbar region: Secondary | ICD-10-CM

## 2018-10-09 MED ORDER — OXYCODONE-ACETAMINOPHEN 10-325 MG PO TABS: 1.0000 | ORAL_TABLET | Freq: Four times a day (QID) | ORAL | 0 refills | Status: DC | PRN

## 2018-10-09 NOTE — Progress Notes (Signed)
Subjective:    Patient ID: George Villa, male    DOB: Jul 23, 1965, 53 y.o.   MRN: [id number]  HPI: George Villa is a 53 y.o. male who returns for follow up appointment for chronic pain and medication refill. He states his pain is located in his lower back. He.rates his pain 0.. His current exercise regime is walking and performing stretching exercises.  George Villa Morphine equivalent is 60.00MME.  Last Oral Swab was Performed on 04/16/2018, it was consistent.   Pain Inventory Average Pain 8 Pain Right Now 0 My pain is dull  In the last 24 hours, has pain interfered with the following? General activity 7 Relation with others 7 Enjoyment of life 7 What TIME of day is your pain at its worst? daytime Sleep (in general) Fair  Pain is worse with: walking and bending Pain improves with: medication Relief from Meds: n/a  Mobility use a cane ability to climb steps?  yes do you drive?  yes  Function disabled: date disabled n/a  Neuro/Psych No problems in this area  Prior Studies no  Physicians involved in your care no   Family History  Problem Relation Age of Onset  . Heart Problems Father   . Hypertension Mother   . Stroke Other    Social History   Socioeconomic History  . Marital status: Single    Spouse name: Not on file  . Number of children: Not on file  . Years of education: Not on file  . Highest education level: Not on file  Occupational History  . Occupation: umemployed     Comment: Works in [employer]  . Financial resource strain: Not on file  . Food insecurity    Worry: Not on file    Inability: Not on file  . Transportation needs    Medical: Not on file    Non-medical: Not on file  Tobacco Use  . Smoking status: Never Smoker  . Smokeless tobacco: Never Used  Substance and Sexual Activity  . Alcohol use: Yes    Alcohol/week: 6.0 standard drinks    Types: 6 Cans of beer per week    Comment: OCCASSIONALLY   . Drug use: No   . Sexual activity: Yes  Lifestyle  . Physical activity    Days per week: Not on file    Minutes per session: Not on file  . Stress: Not on file  Relationships  . Social Herbalist on phone: Not on file    Gets together: Not on file    Attends religious service: Not on file    Active member of club or organization: Not on file    Attends meetings of clubs or organizations: Not on file    Relationship status: Not on file  Other Topics Concern  . Not on file  Social History Narrative   7 cups of caffeine a week    Past Surgical History:  Procedure Laterality Date  . BACK SURGERY    . BRAIN HEMATOMA EVACUATION  2006  . LEFT HEART CATHETERIZATION WITH CORONARY ANGIOGRAM N/A 02/17/2014   Procedure: LEFT HEART CATHETERIZATION WITH CORONARY ANGIOGRAM;  Surgeon: George Grooms, MD;  Location: [facility] CATH LAB;  Service: Cardiovascular;  Laterality: N/A;  . LUMBAR [place] SURGERY  2013  . PERCUTANEOUS CORONARY STENT INTERVENTION (PCI-S)  02/17/2014   Procedure: PERCUTANEOUS CORONARY STENT INTERVENTION (PCI-S);  Surgeon: George Grooms, MD;  Location: [facility] C[facility] LAB;  Service: Cardiovascular;;  . TRACHEOSTOMY  05/2004  . VENTRICULOSTOMY  2006   Past Medical History:  Diagnosis Date  . Acid reflux   . 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  . Chronic lower back pain   . Headache    "monthly" (02/18/2014)  . Hypertension   . 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 Stroke [facility](HCC) 2006   "left leg weak since" (02/18/2014)   There were no vitals taken for this visit.  Opioid Risk Score:   Fall Risk Score:  `1  Depression screen PHQ 2/9  Depression screen [facility] 2/9 09/10/2018 08/05/2018 07/17/2018 08/28/2017 05/24/2017 04/24/2017 09/28/2015  Decreased Interest 0 0 0 0 0 0 0  Down, Depressed, Hopeless 0 0 0 0 0 0 0  PHQ - 2 Score 0 0 0 0 0 0 0     Review of Systems  Genitourinary:       Urine retention        Objective:   Physical Exam Vitals signs and nursing note reviewed.  Constitutional:      Appearance: Normal appearance.  Neck:     Musculoskeletal: Normal range of motion and neck supple.  Cardiovascular:     Rate and Rhythm: Normal rate and regular rhythm.     Pulses: Normal pulses.     Heart sounds: Normal heart sounds.  Pulmonary:     Effort: Pulmonary effort is normal.     Breath sounds: Normal breath sounds.  Neurological:     Mental Status: He is alert.           Assessment & Plan:  1. History of multiple strokes with gait disorder, ongoing ataxia andspasticleft hemiparesis: ContinueHEP as Tolerated. 10/10/2018 2. Chronic low back pain with documented facet arthropathy and history of intradural defect/schwanomma requiring resection, per George Villa Note. Continue current medication regimen. Refilled:Percocet10/325 one q6 prn #120.10/10/2018 We will continue the opioid monitoring program, this consists of regular clinic visits, examinations, routine drug screening, pill counts as well as use of [place] Controlled Substance Reporting System. [place] was reviewed.  15 minutes of face to face patient care time was spent during this visit. All questions were encouraged and answered.  F/U in 1 month

## 2018-11-10 ENCOUNTER — Encounter: Payer: Self-pay | Admitting: Registered Nurse

## 2018-11-10 ENCOUNTER — Encounter: Payer: Medicaid Other | Attending: Physical Medicine & Rehabilitation | Admitting: Registered Nurse

## 2018-11-10 ENCOUNTER — Other Ambulatory Visit: Payer: Self-pay

## 2018-11-10 VITALS — BP 140/89 | HR 74 | Temp 97.5°F | Resp 18 | Ht 67.0 in | Wt 199.6 lb

## 2018-11-10 DIAGNOSIS — K219 Gastro-esophageal reflux disease without esophagitis: Secondary | ICD-10-CM | POA: Insufficient documentation

## 2018-11-10 DIAGNOSIS — M545 Low back pain: Secondary | ICD-10-CM | POA: Diagnosis not present

## 2018-11-10 DIAGNOSIS — G8114 Spastic hemiplegia affecting left nondominant side: Secondary | ICD-10-CM | POA: Diagnosis not present

## 2018-11-10 DIAGNOSIS — I10 Essential (primary) hypertension: Secondary | ICD-10-CM | POA: Diagnosis not present

## 2018-11-10 DIAGNOSIS — G8929 Other chronic pain: Secondary | ICD-10-CM | POA: Insufficient documentation

## 2018-11-10 DIAGNOSIS — G894 Chronic pain syndrome: Secondary | ICD-10-CM | POA: Diagnosis not present

## 2018-11-10 DIAGNOSIS — R51 Headache: Secondary | ICD-10-CM | POA: Diagnosis not present

## 2018-11-10 DIAGNOSIS — Z5181 Encounter for therapeutic drug level monitoring: Secondary | ICD-10-CM | POA: Diagnosis not present

## 2018-11-10 DIAGNOSIS — I69314 Frontal lobe and executive function deficit following cerebral infarction: Secondary | ICD-10-CM | POA: Insufficient documentation

## 2018-11-10 DIAGNOSIS — M47816 Spondylosis without myelopathy or radiculopathy, lumbar region: Secondary | ICD-10-CM

## 2018-11-10 DIAGNOSIS — I69354 Hemiplegia and hemiparesis following cerebral infarction affecting left non-dominant side: Secondary | ICD-10-CM | POA: Diagnosis not present

## 2018-11-10 DIAGNOSIS — Z79891 Long term (current) use of opiate analgesic: Secondary | ICD-10-CM | POA: Diagnosis not present

## 2018-11-10 DIAGNOSIS — I69393 Ataxia following cerebral infarction: Secondary | ICD-10-CM | POA: Insufficient documentation

## 2018-11-10 DIAGNOSIS — R269 Unspecified abnormalities of gait and mobility: Secondary | ICD-10-CM

## 2018-11-10 MED ORDER — OXYCODONE-ACETAMINOPHEN 10-325 MG PO TABS: 1.0000 | ORAL_TABLET | Freq: Four times a day (QID) | ORAL | 0 refills | Status: DC | PRN

## 2018-11-10 NOTE — Progress Notes (Signed)
Subjective:    Patient ID: George Villa, male    DOB: 09/08/1965, 53 y.o.   MRN: [id number]  HPI: George Villa is a 53 y.o. male who returns for follow up appointment for chronic pain and medication refill. He states his pain is located in his lower back. He rates his pain 7. His current exercise regime is walking and riding his stationary bicycle for 30 minutes daily.   [name] Morphine equivalent is 60.00 MME.  Last Oral Swab was Performed on 04/16/2018, it was consistent.   Pain Inventory Average Pain 8 Pain Right Now 7 My pain is dull  In the last 24 hours, has pain interfered with the following? General activity 6 Relation with others 6 Enjoyment of life 6 What TIME of day is your pain at its worst? night Sleep (in general) Fair  Pain is worse with: walking and bending Pain improves with: medication and na Relief from Meds: 10  Mobility use a cane how many minutes can you walk? 10  Function disabled: date disabled .  Neuro/Psych bladder control problems  Prior Studies Any changes since last visit?  no  Physicians involved in your care Any changes since last visit?  no   Family History  Problem Relation Age of Onset  . Heart Problems Father   . Hypertension Mother   . Stroke Other    Social History   Socioeconomic History  . Marital status: Single    Spouse name: Not on file  . Number of children: Not on file  . Years of education: Not on file  . Highest education level: Not on file  Occupational History  . Occupation: umemployed     Comment: Works in [employer]  . Financial resource strain: Not on file  . Food insecurity    Worry: Not on file    Inability: Not on file  . Transportation needs    Medical: Not on file    Non-medical: Not on file  Tobacco Use  . Smoking status: Never Smoker  . Smokeless tobacco: Never Used  Substance and Sexual Activity  . Alcohol use: Yes    Alcohol/week: 6.0 standard drinks    Types: 6  Cans of beer per week    Comment: OCCASSIONALLY   . Drug use: No  . Sexual activity: Yes  Lifestyle  . Physical activity    Days per week: Not on file    Minutes per session: Not on file  . Stress: Not on file  Relationships  . Social Herbalist on phone: Not on file    Gets together: Not on file    Attends religious service: Not on file    Active member of club or organization: Not on file    Attends meetings of clubs or organizations: Not on file    Relationship status: Not on file  Other Topics Concern  . Not on file  Social History Narrative   7 cups of caffeine a week    Past Surgical History:  Procedure Laterality Date  . BACK SURGERY    . BRAIN HEMATOMA EVACUATION  2006  . LEFT HEART CATHETERIZATION WITH CORONARY ANGIOGRAM N/A 02/17/2014   Procedure: LEFT HEART CATHETERIZATION WITH CORONARY ANGIOGRAM;  Surgeon: George Villa;  Location[facility] CATH LAB;  Service: Cardiovascular;  Laterality: N/A;  . LUMBAR [place] SURGERY  2013  . PERCUTANEOUS CORONARY STENT INTERVENTION (PCI-S)  02/17/2014   Procedure: PERCUTANEOUS CORONARY  STENT INTERVENTION (PCI-S);  Surgeon: George Villa;  [facility];  Service: Cardiovascular;;  . TRACHEOSTOMY  05/2004  . VENTRICULOSTOMY  2006   Past Medical History:  Diagnosis Date  . Acid reflux   . 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  . Chronic lower back pain   . Headache    "monthly" (02/18/2014)  . Hypertension   . 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 Stroke [facility] 2006   "left leg weak since" (02/18/2014)   There were no vitals taken for this visit.  Opioid Risk Score:   Fall Risk Score:  `1  Depression screen PHQ 2/9  [facility] 09/10/2018 08/05/2018 07/17/2018 08/28/2017 05/24/2017 04/24/2017 09/28/2015  Decreased Interest 0 0 0 0 0 0 0  Down, Depressed, Hopeless 0 0 0 0 0 0 0  PHQ - 2 Score 0 0 0 0 0 0  0     Review of Systems  Constitutional: Negative.   HENT: Negative.   Eyes: Negative.   Respiratory: Negative.   Cardiovascular: Negative.   Gastrointestinal: Negative.   Endocrine: Negative.   Genitourinary: Positive for difficulty urinating.  Musculoskeletal: Positive for arthralgias, back pain and gait problem.  Skin: Negative.   Allergic/Immunologic: Negative.   Hematological: Negative.   Psychiatric/Behavioral: Negative.   All other systems reviewed and are negative.      Objective:   Physical Exam Vitals signs and nursing note reviewed.  Constitutional:      Appearance: Normal appearance.  Neck:     Musculoskeletal: Normal range of motion and neck supple.  Cardiovascular:     Rate and Rhythm: Normal rate and regular rhythm.     Pulses: Normal pulses.     Heart sounds: Normal heart sounds.  Pulmonary:     Effort: Pulmonary effort is normal.     Breath sounds: Normal breath sounds.  Musculoskeletal:     Comments: Normal Muscle Bulk and Muscle Testing Reveals:  Upper Extremities: Full ROM and Muscle Strength 5/5  Lumbar Paraspinal Tenderness: L-4-L-5 Lower Extremities: Full ROM and Muscle Strength 5/5 Arises from chair slowly using cane for support Antalgic  Gait   Skin:    General: Skin is warm and dry.  Neurological:     Mental Status: He is alert and oriented to person, place, and time.  Psychiatric:        Mood and Affect: Mood normal.        Behavior: Behavior normal.           Assessment & Plan:  1. History of multiple strokes with gait disorder, ongoing ataxia andspasticleft hemiparesis: ContinueHEP as Tolerated. 11/10/2018 2. Chronic low back pain with documented facet arthropathy and history of intradural defect/schwanomma requiring resection, per Dr. Riley KillSwartz Note. Continue current medication regimen. Refilled:Percocet10/325 one q6 prn #120.11/10/2018 We will continue the opioid monitoring program, this consists of regular clinic  visits, examinations, routine drug screening, pill counts as well as use of [place]  Controlled Substance Reporting System. [place] was [place].  15minutes of face to face patient care time was spent during this visit. All questions were encouraged and answered.  F/U in 1 month

## 2018-12-01 DIAGNOSIS — Z125 Encounter for screening for malignant neoplasm of prostate: Secondary | ICD-10-CM | POA: Diagnosis not present

## 2018-12-01 DIAGNOSIS — R0602 Shortness of breath: Secondary | ICD-10-CM | POA: Diagnosis not present

## 2018-12-01 DIAGNOSIS — E559 Vitamin D deficiency, unspecified: Secondary | ICD-10-CM | POA: Diagnosis not present

## 2018-12-01 DIAGNOSIS — R5383 Other fatigue: Secondary | ICD-10-CM | POA: Diagnosis not present

## 2018-12-01 DIAGNOSIS — Z0000 Encounter for general adult medical examination without abnormal findings: Secondary | ICD-10-CM | POA: Diagnosis not present

## 2018-12-01 DIAGNOSIS — I10 Essential (primary) hypertension: Secondary | ICD-10-CM | POA: Diagnosis not present

## 2018-12-01 DIAGNOSIS — Z114 Encounter for screening for human immunodeficiency virus [HIV]: Secondary | ICD-10-CM | POA: Diagnosis not present

## 2018-12-01 DIAGNOSIS — E789 Disorder of lipoprotein metabolism, unspecified: Secondary | ICD-10-CM | POA: Diagnosis not present

## 2018-12-01 DIAGNOSIS — Z1159 Encounter for screening for other viral diseases: Secondary | ICD-10-CM | POA: Diagnosis not present

## 2018-12-01 NOTE — Telephone Encounter (Signed)
Status:APPROVED Effective Begin Date:08/11/2018 Effective End Date:02/07/2019  Confirmation #:[id number] W

## 2018-12-08 ENCOUNTER — Encounter: Payer: Self-pay | Admitting: Registered Nurse

## 2018-12-08 ENCOUNTER — Other Ambulatory Visit: Payer: Self-pay

## 2018-12-08 ENCOUNTER — Encounter: Payer: Medicaid Other | Admitting: Registered Nurse

## 2018-12-08 VITALS — BP 137/94 | HR 94 | Temp 97.5°F | Ht 67.0 in | Wt 200.2 lb

## 2018-12-08 DIAGNOSIS — R269 Unspecified abnormalities of gait and mobility: Secondary | ICD-10-CM

## 2018-12-08 DIAGNOSIS — Z5181 Encounter for therapeutic drug level monitoring: Secondary | ICD-10-CM | POA: Diagnosis not present

## 2018-12-08 DIAGNOSIS — G8114 Spastic hemiplegia affecting left nondominant side: Secondary | ICD-10-CM | POA: Diagnosis not present

## 2018-12-08 DIAGNOSIS — K219 Gastro-esophageal reflux disease without esophagitis: Secondary | ICD-10-CM | POA: Diagnosis not present

## 2018-12-08 DIAGNOSIS — R51 Headache: Secondary | ICD-10-CM | POA: Diagnosis not present

## 2018-12-08 DIAGNOSIS — M47816 Spondylosis without myelopathy or radiculopathy, lumbar region: Secondary | ICD-10-CM | POA: Diagnosis not present

## 2018-12-08 DIAGNOSIS — I69354 Hemiplegia and hemiparesis following cerebral infarction affecting left non-dominant side: Secondary | ICD-10-CM | POA: Diagnosis not present

## 2018-12-08 DIAGNOSIS — Z79891 Long term (current) use of opiate analgesic: Secondary | ICD-10-CM | POA: Diagnosis not present

## 2018-12-08 DIAGNOSIS — G894 Chronic pain syndrome: Secondary | ICD-10-CM | POA: Diagnosis not present

## 2018-12-08 DIAGNOSIS — M545 Low back pain: Secondary | ICD-10-CM | POA: Diagnosis not present

## 2018-12-08 DIAGNOSIS — I10 Essential (primary) hypertension: Secondary | ICD-10-CM | POA: Diagnosis not present

## 2018-12-08 DIAGNOSIS — I69314 Frontal lobe and executive function deficit following cerebral infarction: Secondary | ICD-10-CM | POA: Diagnosis not present

## 2018-12-08 DIAGNOSIS — G8929 Other chronic pain: Secondary | ICD-10-CM | POA: Diagnosis not present

## 2018-12-08 DIAGNOSIS — I69393 Ataxia following cerebral infarction: Secondary | ICD-10-CM | POA: Diagnosis not present

## 2018-12-08 DIAGNOSIS — I639 Cerebral infarction, unspecified: Secondary | ICD-10-CM | POA: Diagnosis not present

## 2018-12-08 DIAGNOSIS — E789 Disorder of lipoprotein metabolism, unspecified: Secondary | ICD-10-CM | POA: Diagnosis not present

## 2018-12-08 MED ORDER — OXYCODONE-ACETAMINOPHEN 10-325 MG PO TABS: 1.0000 | ORAL_TABLET | Freq: Four times a day (QID) | ORAL | 0 refills | Status: DC | PRN

## 2018-12-08 NOTE — Progress Notes (Signed)
Subjective:    Patient ID: George Villa, male    DOB: 01/07/1966, 53 y.o.   MRN: [id number]  HPI : George Villa is a 53 y.o. male who returns for follow up appointment for chronic pain and medication refill. He states his pain is located in his lower back. He  Rates his  Pain 7. His current exercise regime is walking and riding his stationary bicycle for 30 minutes daily.   [name] Morphine equivalent is 60.00MME.  Last Oral Swab was Performed on 04/16/2018, it was consistent.   Pain Inventory Average Pain 7 Pain Right Now 7 My pain is dull  In the last 24 hours, has pain interfered with the following? General activity 7 Relation with others 7 Enjoyment of life 7 What TIME of day is your pain at its worst? n/a Sleep (in general) Fair  Pain is worse with: walking Pain improves with: n/a Relief from Meds: 9  Mobility use a cane how many minutes can you walk? 15  Function disabled: date disabled .  Neuro/Psych bladder control problems  Prior Studies Any changes since last visit?  no  Physicians involved in your care    Family History  Problem Relation Age of Onset  . Heart Problems Father   . Hypertension Mother   . Stroke Other    Social History   Socioeconomic History  . Marital status: Single    Spouse name: Not on file  . Number of children: Not on file  . Years of education: Not on file  . Highest education level: Not on file  Occupational History  . Occupation: umemployed     Comment: Works in [employer]   Social Needs  . Financial resource strain: Not on file  . Food insecurity    Worry: Not on file    Inability: Not on file  . Transportation needs    Medical: Not on file    Non-medical: Not on file  Tobacco Use  . Smoking status: Never Smoker  . Smokeless tobacco: Never Used  Substance and Sexual Activity  . Alcohol use: Yes    Alcohol/week: 6.0 standard drinks    Types: 6 Cans of beer per week    Comment: OCCASSIONALLY   . Drug  use: No  . Sexual activity: Yes  Lifestyle  . Physical activity    Days per week: Not on file    Minutes per session: Not on file  . Stress: Not on file  Relationships  . Social Musicianconnections    Talks on phone: Not on file    Gets together: Not on file    Attends religious service: Not on file    Active member of club or organization: Not on file    Attends meetings of clubs or organizations: Not on file    Relationship status: Not on file  Other Topics Concern  . Not on file  Social History Narrative   7 cups of caffeine a week    Past Surgical History:  Procedure Laterality Date  . BACK SURGERY    . BRAIN HEMATOMA EVACUATION  2006  . LEFT HEART CATHETERIZATION WITH CORONARY ANGIOGRAM N/A 02/17/2014   Procedure: LEFT HEART CATHETERIZATION WITH CORONARY ANGIOGRAM;  Surgeon: Lesleigh NoeHenry W Courser III, MD;  [facility];  Service: Cardiovascular;  Laterality: N/A;  . LUMBAR [place] SURGERY  2013  . PERCUTANEOUS CORONARY STENT INTERVENTION (PCI-S)  02/17/2014   Procedure: PERCUTANEOUS CORONARY STENT INTERVENTION (PCI-S);  Surgeon: Lyn RecordsHenry W Basnett  III, MD;  Location: [facility] CATH LAB;  Service: Cardiovascular;;  . TRACHEOSTOMY  05/2004  . VENTRICULOSTOMY  2006   Past Medical History:  Diagnosis Date  . Acid reflux   . Cerebellar hemorrhage ([place])    a. 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  . Chronic lower back pain   . Headache    "monthly" (02/18/2014)  . Hypertension   . Inferior MI ([place]) 02/17/2014   Archie Endo 02/17/2014  . Stroke [facility]) 2006   "left leg weak since" (02/18/2014)   BP (!) 137/94 (BP Location: Left Arm)   Pulse 94   Temp (!) 97.5 F (36.4 C)   Ht 5\' 7"  (1.702 m)   Wt 200 lb 3.2 oz (90.8 kg)   SpO2 97%   BMI 31.36 kg/m   Opioid Risk Score:   Fall Risk Score:  `1  Depression screen PHQ 2/9  Depression screen [facility] 2/9 09/10/2018 08/05/2018 07/17/2018 08/28/2017 05/24/2017 04/24/2017 09/28/2015  Decreased Interest 0 0 0 0 0 0 0  Down,  Depressed, Hopeless 0 0 0 0 0 0 0  PHQ - 2 Score 0 0 0 0 0 0 0    Review of Systems  Constitutional: Negative.   HENT: Negative.   Eyes: Negative.   Respiratory: Negative.   Cardiovascular: Negative.   Gastrointestinal: Negative.   Endocrine: Negative.   Genitourinary: Positive for decreased urine volume.  Musculoskeletal: Negative.   Skin: Negative.   Allergic/Immunologic: Negative.   Neurological: Negative.   Hematological: Negative.   Psychiatric/Behavioral: Negative.   All other systems reviewed and are negative.      Objective:   Physical Exam Vitals signs and nursing note reviewed.  Constitutional:      Appearance: Normal appearance.  Neck:     Musculoskeletal: Normal range of motion and neck supple.  Cardiovascular:     Rate and Rhythm: Normal rate and regular rhythm.     Pulses: Normal pulses.     Heart sounds: Normal heart sounds.  Pulmonary:     Effort: Pulmonary effort is normal.     Breath sounds: Normal breath sounds.  Musculoskeletal:     Comments: Normal Muscle Bulk and Muscle Testing Reveals:  Upper Extremities: Full ROM and Muscle Strength 5/5 Lumbar Paraspinal Tenderness: L-4- L-5  Lower Extremities: Full ROM and Muscle Strength 5/5 Arises from chair slowly Antalgic  Gait   Skin:    General: Skin is warm and dry.  Neurological:     Mental Status: He is alert and oriented to person, place, and time.  Psychiatric:        Mood and Affect: Mood normal.        Behavior: Behavior normal.           Assessment & Plan:  1. History of multiple strokes with gait disorder, ongoing ataxia andspasticleft hemiparesis: ContinueHEP as Tolerated. 12/08/2018 2. Chronic low back pain with documented facet arthropathy and history of intradural defect/schwanomma requiring resection, per Dr. Naaman Plummer Note. Continue current medication regimen. Refilled:Percocet10/325 one q6 prn #120.12/08/2018 We will continue the opioid monitoring program, this consists  of regular clinic visits, examinations, routine drug screening, pill counts as well as use of [place] Controlled Substance Reporting System. NCCSRS was reviewed.  10minutes of face to face patient care time was spent during this visit. All questions were encouraged and answered.  F/U in 1 month

## 2018-12-25 DIAGNOSIS — N183 Chronic kidney disease, stage 3 (moderate): Secondary | ICD-10-CM | POA: Diagnosis not present

## 2019-01-05 ENCOUNTER — Encounter: Payer: Medicaid Other | Attending: Physical Medicine & Rehabilitation | Admitting: Registered Nurse

## 2019-01-05 ENCOUNTER — Other Ambulatory Visit: Payer: Self-pay

## 2019-01-05 ENCOUNTER — Encounter: Payer: Self-pay | Admitting: Registered Nurse

## 2019-01-05 VITALS — BP 125/89 | HR 80 | Temp 97.5°F | Ht 67.0 in | Wt 200.0 lb

## 2019-01-05 DIAGNOSIS — I69393 Ataxia following cerebral infarction: Secondary | ICD-10-CM | POA: Diagnosis not present

## 2019-01-05 DIAGNOSIS — I69354 Hemiplegia and hemiparesis following cerebral infarction affecting left non-dominant side: Secondary | ICD-10-CM | POA: Insufficient documentation

## 2019-01-05 DIAGNOSIS — Z79891 Long term (current) use of opiate analgesic: Secondary | ICD-10-CM | POA: Diagnosis not present

## 2019-01-05 DIAGNOSIS — R51 Headache: Secondary | ICD-10-CM | POA: Insufficient documentation

## 2019-01-05 DIAGNOSIS — M545 Low back pain: Secondary | ICD-10-CM | POA: Insufficient documentation

## 2019-01-05 DIAGNOSIS — I69314 Frontal lobe and executive function deficit following cerebral infarction: Secondary | ICD-10-CM | POA: Insufficient documentation

## 2019-01-05 DIAGNOSIS — G894 Chronic pain syndrome: Secondary | ICD-10-CM

## 2019-01-05 DIAGNOSIS — G8929 Other chronic pain: Secondary | ICD-10-CM | POA: Diagnosis not present

## 2019-01-05 DIAGNOSIS — I10 Essential (primary) hypertension: Secondary | ICD-10-CM | POA: Insufficient documentation

## 2019-01-05 DIAGNOSIS — Z5181 Encounter for therapeutic drug level monitoring: Secondary | ICD-10-CM

## 2019-01-05 DIAGNOSIS — G8114 Spastic hemiplegia affecting left nondominant side: Secondary | ICD-10-CM | POA: Diagnosis not present

## 2019-01-05 DIAGNOSIS — K219 Gastro-esophageal reflux disease without esophagitis: Secondary | ICD-10-CM | POA: Insufficient documentation

## 2019-01-05 DIAGNOSIS — M47816 Spondylosis without myelopathy or radiculopathy, lumbar region: Secondary | ICD-10-CM

## 2019-01-05 MED ORDER — OXYCODONE-ACETAMINOPHEN 10-325 MG PO TABS: 1.0000 | ORAL_TABLET | Freq: Four times a day (QID) | ORAL | 0 refills | Status: DC | PRN

## 2019-01-05 NOTE — Progress Notes (Signed)
Subjective:    Patient ID: George Villa, male    DOB: 01-22-1966, 53 y.o.   MRN: [id number]  HPI: George Villa is a 53 y.o. male who returns for follow up appointment for chronic pain and medication refill. He states his pain is located in his lower back . He rates his pain 7. His current exercise regime is walking and riding his stationary bicycle daily for 30 minutes.   Mr. Buchler Morphine equivalent is 60.00  MME.  Oral Swab was Performed today.   Pain Inventory Average Pain 7 Pain Right Now 7 My pain is dull  In the last 24 hours, has pain interfered with the following? General activity 7 Relation with others 7 Enjoyment of life 7 What TIME of day is your pain at its worst? night Sleep (in general) Fair  Pain is worse with: standing Pain improves with: medication Relief from Meds: 6  Mobility walk with assistance use a cane ability to climb steps?  yes do you drive?  yes  Function disabled: date disabled na  Neuro/Psych weakness  Prior Studies Any changes since last visit?  no  Physicians involved in your care Any changes since last visit?  no   Family History  Problem Relation Age of Onset  . Heart Problems Father   . Hypertension Mother   . Stroke Other    Social History   Socioeconomic History  . Marital status: Single    Spouse name: Not on file  . Number of children: Not on file  . Years of education: Not on file  . Highest education level: Not on file  Occupational History  . Occupation: umemployed     Comment: Works in [employer]  . Financial resource strain: Not on file  . Food insecurity    Worry: Not on file    Inability: Not on file  . Transportation needs    Medical: Not on file    Non-medical: Not on file  Tobacco Use  . Smoking status: Never Smoker  . Smokeless tobacco: Never Used  Substance and Sexual Activity  . Alcohol use: Yes    Alcohol/week: 6.0 standard drinks    Types: 6 Cans of beer per week   Comment: OCCASSIONALLY   . Drug use: No  . Sexual activity: Yes  Lifestyle  . Physical activity    Days per week: Not on file    Minutes per session: Not on file  . Stress: Not on file  Relationships  . Social Herbalist on phone: Not on file    Gets together: Not on file    Attends religious service: Not on file    Active member of club or organization: Not on file    Attends meetings of clubs or organizations: Not on file    Relationship status: Not on file  Other Topics Concern  . Not on file  Social History Narrative   7 cups of caffeine a week    Past Surgical History:  Procedure Laterality Date  . BACK SURGERY    . BRAIN HEMATOMA EVACUATION  2006  . LEFT HEART CATHETERIZATION WITH CORONARY ANGIOGRAM N/A 02/17/2014   Procedure: LEFT HEART CATHETERIZATION WITH CORONARY ANGIOGRAM;  Surgeon: Sinclair Grooms, MD;  Location[facility] CATH LAB;  Service: Cardiovascular;  Laterality: N/A;  . LUMBAR [place] SURGERY  2013  . PERCUTANEOUS CORONARY STENT INTERVENTION (PCI-S)  02/17/2014   Procedure: PERCUTANEOUS CORONARY STENT INTERVENTION (PCI-S);  Surgeon: [name], MD;  Location: [facility] CATH LAB;  Service: Cardiovascular;;  . TRACHEOSTOMY  05/2004  . VENTRICULOSTOMY  2006   Past Medical History:  Diagnosis Date  . Acid reflux   . 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  . Chronic lower back pain   . Headache    "monthly" (02/18/2014)  . Hypertension   . 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 Stroke [facility] [address]) 2006   "left leg weak since" (02/18/2014)   BP 125/89   Pulse 80   Temp (!) 97.5 F (36.4 C)   Ht 5\' 7"  (1.702 m)   Wt 200 lb (90.7 kg)   SpO2 96%   BMI 31.32 kg/m   Opioid Risk Score:   Fall Risk Score:  `1  Depression screen PHQ 2/9  Depression screen [facility] 2/9 01/05/2019 09/10/2018 08/05/2018 07/17/2018 08/28/2017 05/24/2017 04/24/2017  Decreased Interest 0 0 0 0 0 0 0  Down,  Depressed, Hopeless 0 0 0 0 0 0 0  PHQ - 2 Score 0 0 0 0 0 0 0    Review of Systems  Constitutional: Negative.   HENT: Negative.   Eyes: Negative.   Respiratory: Negative.   Cardiovascular: Negative.   Gastrointestinal: Negative.   Endocrine: Negative.   Genitourinary:       Urine retention  Musculoskeletal: Positive for back pain and gait problem.  Skin: Negative.   Allergic/Immunologic: Negative.   Neurological: Positive for weakness.  Hematological: Negative.   Psychiatric/Behavioral: Negative.   All other systems reviewed and are negative.      Objective:   Physical Exam Vitals signs and nursing note reviewed.  Constitutional:      Appearance: Normal appearance.  Neck:     Musculoskeletal: Normal range of motion and neck supple.  Cardiovascular:     Rate and Rhythm: Normal rate and regular rhythm.     Pulses: Normal pulses.     Heart sounds: Normal heart sounds.  Pulmonary:     Effort: Pulmonary effort is normal.     Breath sounds: Normal breath sounds.  Musculoskeletal:     Comments: Normal Muscle Bulk and Muscle Testing Reveals: Upper Extremities: Full ROM and Muscle Strength 5/5  Lumbar Paraspinal Tenderness: L-4-L-5 Lower Extremities: Full ROM and Muscle Strength 5/5 Arises from Table slowly using cane for support Antalgic Gait   Skin:    General: Skin is warm and dry.  Neurological:     Mental Status: He is alert and oriented to person, place, and time.  Psychiatric:        Mood and Affect: Mood normal.        Behavior: Behavior normal.           Assessment & Plan:  1. History of multiple strokes with gait disorder, ongoing ataxia andspasticleft hemiparesis: ContinueHEP as Tolerated. 01/05/2019 2. Chronic low back pain with documented facet arthropathy and history of intradural defect/schwanomma requiring resection, per Dr. 01/07/2019 Note. Continue current medication regimen. Refilled:Percocet10/325 one q6 prn #120.01/05/2019 We will  continue the opioid monitoring program, this consists of regular clinic visits, examinations, routine drug screening, pill counts as well as use of 01/07/2019 Controlled Substance Reporting System. [place]CCSRS was reviewed.  [place] of face to face patient care time was spent during this visit. All questions were encouraged and answered.  F/U in 1 month

## 2019-01-08 LAB — DRUG TOX ALC METAB W/CON, ORAL FLD: Alcohol Metabolite: NEGATIVE ng/mL (ref <25)

## 2019-01-08 LAB — DRUG TOX MONITOR 1 W/CONF, ORAL FLD

## 2019-01-12 ENCOUNTER — Telehealth: Payer: Self-pay | Admitting: *Deleted

## 2019-01-12 NOTE — Telephone Encounter (Signed)
Oral swab drug screen was negative for all medication. He had # 3 pills left at encounter visit and reported last take the pm before the test. Metabolites would have been expected.

## 2019-01-13 NOTE — Telephone Encounter (Signed)
We will do UDS next time. His last two Oral Swab shows inconsistency.

## 2019-01-14 NOTE — Telephone Encounter (Signed)
I will place a note on schedule for UDS only for next visit.

## 2019-01-29 DIAGNOSIS — Z23 Encounter for immunization: Secondary | ICD-10-CM | POA: Diagnosis not present

## 2019-02-04 ENCOUNTER — Telehealth: Payer: Self-pay | Admitting: *Deleted

## 2019-02-04 ENCOUNTER — Encounter: Payer: Medicaid Other | Attending: Physical Medicine & Rehabilitation | Admitting: Registered Nurse

## 2019-02-04 ENCOUNTER — Encounter: Payer: Self-pay | Admitting: Registered Nurse

## 2019-02-04 ENCOUNTER — Other Ambulatory Visit: Payer: Self-pay

## 2019-02-04 VITALS — BP 122/90 | HR 100 | Temp 97.5°F | Ht 67.0 in | Wt 201.0 lb

## 2019-02-04 DIAGNOSIS — M47816 Spondylosis without myelopathy or radiculopathy, lumbar region: Secondary | ICD-10-CM | POA: Diagnosis not present

## 2019-02-04 DIAGNOSIS — G894 Chronic pain syndrome: Secondary | ICD-10-CM

## 2019-02-04 DIAGNOSIS — G8929 Other chronic pain: Secondary | ICD-10-CM | POA: Insufficient documentation

## 2019-02-04 DIAGNOSIS — R262 Difficulty in walking, not elsewhere classified: Secondary | ICD-10-CM | POA: Insufficient documentation

## 2019-02-04 DIAGNOSIS — R269 Unspecified abnormalities of gait and mobility: Secondary | ICD-10-CM | POA: Diagnosis not present

## 2019-02-04 DIAGNOSIS — Z8673 Personal history of transient ischemic attack (TIA), and cerebral infarction without residual deficits: Secondary | ICD-10-CM | POA: Diagnosis not present

## 2019-02-04 DIAGNOSIS — Z5181 Encounter for therapeutic drug level monitoring: Secondary | ICD-10-CM

## 2019-02-04 DIAGNOSIS — Z760 Encounter for issue of repeat prescription: Secondary | ICD-10-CM | POA: Insufficient documentation

## 2019-02-04 DIAGNOSIS — Z955 Presence of coronary angioplasty implant and graft: Secondary | ICD-10-CM | POA: Insufficient documentation

## 2019-02-04 DIAGNOSIS — M545 Low back pain: Secondary | ICD-10-CM | POA: Diagnosis not present

## 2019-02-04 DIAGNOSIS — I252 Old myocardial infarction: Secondary | ICD-10-CM | POA: Diagnosis not present

## 2019-02-04 DIAGNOSIS — Z79891 Long term (current) use of opiate analgesic: Secondary | ICD-10-CM | POA: Diagnosis not present

## 2019-02-04 DIAGNOSIS — I10 Essential (primary) hypertension: Secondary | ICD-10-CM | POA: Diagnosis not present

## 2019-02-04 MED ORDER — OXYCODONE-ACETAMINOPHEN 10-325 MG PO TABS: 1.0000 | ORAL_TABLET | Freq: Four times a day (QID) | ORAL | 0 refills | Status: DC | PRN

## 2019-02-04 NOTE — Progress Notes (Signed)
Subjective:    Patient ID: George Villa, male    DOB: January 12, 1966, 53 y.o.   MRN: [id number]  HPI: George Villa is a 53 y.o. male who returns for follow up appointment for chronic pain and medication refill. He states his  pain is located in his lower back. He rates his pain 7. His  current exercise regime is walking and riding his stationary bicycle for 30 minutes daily and light household work.    George Villa Morphine equivalent is 60.00 MME. UDS Performed today.     Pain Inventory Average Pain 7 Pain Right Now 7 My pain is dull  In the last 24 hours, has pain interfered with the following? General activity 8 Relation with others 7 Enjoyment of life 7 What TIME of day is your pain at its worst? night Sleep (in general) Fair  Pain is worse with: walking and standing Pain improves with: medication Relief from Meds: 9  Mobility walk with assistance use a cane how many minutes can you walk? 20 ability to climb steps?  yes do you drive?  yes  Function disabled: date disabled .  Neuro/Psych bladder control problems trouble walking  Prior Studies Any changes since last visit?  no  Physicians involved in your care Any changes since last visit?  no   Family History  Problem Relation Age of Onset  . Heart Problems Father   . Hypertension Mother   . Stroke Other    Social History   Socioeconomic History  . Marital status: Single    Spouse name: Not on file  . Number of children: Not on file  . Years of education: Not on file  . Highest education level: Not on file  Occupational History  . Occupation: umemployed     Comment: Works in [employer]  . Financial resource strain: Not on file  . Food insecurity    Worry: Not on file    Inability: Not on file  . Transportation needs    Medical: Not on file    Non-medical: Not on file  Tobacco Use  . Smoking status: Never Smoker  . Smokeless tobacco: Never Used  Substance and Sexual Activity   . Alcohol use: Yes    Alcohol/week: 6.0 standard drinks    Types: 6 Cans of beer per week    Comment: OCCASSIONALLY   . Drug use: No  . Sexual activity: Yes  Lifestyle  . Physical activity    Days per week: Not on file    Minutes per session: Not on file  . Stress: Not on file  Relationships  . Social Herbalist on phone: Not on file    Gets together: Not on file    Attends religious service: Not on file    Active member of club or organization: Not on file    Attends meetings of clubs or organizations: Not on file    Relationship status: Not on file  Other Topics Concern  . Not on file  Social History Narrative   7 cups of caffeine a week    Past Surgical History:  Procedure Laterality Date  . BACK SURGERY    . BRAIN HEMATOMA EVACUATION  2006  . LEFT HEART CATHETERIZATION WITH CORONARY ANGIOGRAM N/A 02/17/2014   Procedure: LEFT HEART CATHETERIZATION WITH CORONARY ANGIOGRAM;  Surgeon: Sinclair Grooms, MD;  Location: [facility] CATH LAB;  Service: Cardiovascular;  Laterality: N/A;  . LUMBAR [place] SURGERY  2013  . PERCUTANEOUS CORONARY STENT INTERVENTION (PCI-S)  02/17/2014   Procedure: PERCUTANEOUS CORONARY STENT INTERVENTION (PCI-S);  Surgeon: [name], MD;  Location: [facility] CATH LAB;  Service: Cardiovascular;;  . TRACHEOSTOMY  05/2004  . VENTRICULOSTOMY  2006   Past Medical History:  Diagnosis Date  . Acid reflux   . 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  . Chronic lower back pain   . Headache    "monthly" (02/18/2014)  . Hypertension   . 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 Stroke [facility]) 2006   "left leg weak since" (02/18/2014)   There were no vitals taken for this visit.  Opioid Risk Score:   Fall Risk Score:  `1  Depression screen PHQ 2/9  Depression screen [facility] 2/9 01/05/2019 09/10/2018 08/05/2018 07/17/2018 08/28/2017 05/24/2017 04/24/2017  Decreased Interest 0 0 0 0 0 0 0   Down, Depressed, Hopeless 0 0 0 0 0 0 0  PHQ - 2 Score 0 0 0 0 0 0 0    Review of Systems  Constitutional: Negative.   HENT: Negative.   Eyes: Negative.   Respiratory: Negative.   Cardiovascular: Negative.   Gastrointestinal: Negative.   Endocrine: Negative.   Genitourinary: Positive for difficulty urinating.  Musculoskeletal: Positive for back pain and gait problem.  Skin: Negative.   Allergic/Immunologic: Negative.   Psychiatric/Behavioral: Negative.   All other systems reviewed and are negative.      Objective:   Physical Exam Vitals signs and nursing note reviewed.  Constitutional:      Appearance: Normal appearance.  Neck:     Musculoskeletal: Normal range of motion and neck supple.  Cardiovascular:     Rate and Rhythm: Normal rate and regular rhythm.     Pulses: Normal pulses.     Heart sounds: Normal heart sounds.  Pulmonary:     Effort: Pulmonary effort is normal.     Breath sounds: Normal breath sounds.  Musculoskeletal:     Comments: Normal Muscle Bulk and Muscle Testing Reveals:  Upper Extremities: Full ROM and Muscle Strength 5/5 Lumbar Paraspinal Tenderness: L-4-L-5 Lower Extremities: Full ROM and Muscle strength 5/5 Arises from Table Slowly using cane for support Wide Based Gait   Skin:    General: Skin is warm and dry.  Neurological:     Mental Status: He is alert and oriented to person, place, and time.  Psychiatric:        Mood and Affect: Mood normal.        Behavior: Behavior normal.           Assessment & Plan:  1. History of multiple strokes with gait disorder, ongoing ataxia andspasticleft hemiparesis: ContinueHEP as Tolerated. 02/04/2019 2. Chronic low back pain with documented facet arthropathy and history of intradural defect/schwanomma requiring resection, per Dr. Riley Kill Note. Continue current medication regimen. Refilled:Percocet10/325 one q6 prn #120.02/04/2019 We will continue the opioid monitoring program, this  consists of regular clinic visits, examinations, routine drug screening, pill counts as well as use of [place] Controlled Substance Reporting System. [place]CCSRS was reviewed.  of face to face patient care time was spent during this visit. All questions were encouraged and answered.  F/U in 1 month

## 2019-02-04 NOTE — Telephone Encounter (Signed)
Prior auth for oxycodone acetaminophen 10/325 #120 submitted electronically through [employer]. Confirmation #:[id number] W

## 2019-02-04 NOTE — Telephone Encounter (Signed)
Approval received for oxycodone acetaminophen 10/325 #120 from [place] Confirmation #:[id number] W Status: APPROVED Effective Begin Date:02/04/2019 Effective End Date:08/03/2019. George Villa and pharmacy notified.

## 2019-02-09 ENCOUNTER — Telehealth: Payer: Self-pay | Admitting: *Deleted

## 2019-02-09 LAB — TOXASSURE SELECT,+ANTIDEPR,UR

## 2019-02-09 NOTE — Telephone Encounter (Signed)
Urine drug screen for this encounter is consistent for prescribed medication 

## 2019-03-04 ENCOUNTER — Telehealth: Payer: Self-pay | Admitting: Registered Nurse

## 2019-03-04 ENCOUNTER — Encounter: Payer: Medicaid Other | Attending: Physical Medicine & Rehabilitation | Admitting: Registered Nurse

## 2019-03-04 ENCOUNTER — Encounter: Payer: Self-pay | Admitting: Registered Nurse

## 2019-03-04 ENCOUNTER — Telehealth: Payer: Self-pay | Admitting: *Deleted

## 2019-03-04 ENCOUNTER — Other Ambulatory Visit: Payer: Self-pay

## 2019-03-04 VITALS — BP 139/87 | HR 76 | Ht 67.0 in | Wt 200.0 lb

## 2019-03-04 DIAGNOSIS — M47816 Spondylosis without myelopathy or radiculopathy, lumbar region: Secondary | ICD-10-CM

## 2019-03-04 DIAGNOSIS — R262 Difficulty in walking, not elsewhere classified: Secondary | ICD-10-CM | POA: Insufficient documentation

## 2019-03-04 DIAGNOSIS — G894 Chronic pain syndrome: Secondary | ICD-10-CM | POA: Diagnosis not present

## 2019-03-04 DIAGNOSIS — Z5181 Encounter for therapeutic drug level monitoring: Secondary | ICD-10-CM

## 2019-03-04 DIAGNOSIS — R269 Unspecified abnormalities of gait and mobility: Secondary | ICD-10-CM

## 2019-03-04 DIAGNOSIS — Z79891 Long term (current) use of opiate analgesic: Secondary | ICD-10-CM | POA: Diagnosis not present

## 2019-03-04 DIAGNOSIS — Z955 Presence of coronary angioplasty implant and graft: Secondary | ICD-10-CM | POA: Diagnosis not present

## 2019-03-04 DIAGNOSIS — Z8673 Personal history of transient ischemic attack (TIA), and cerebral infarction without residual deficits: Secondary | ICD-10-CM | POA: Diagnosis not present

## 2019-03-04 DIAGNOSIS — I10 Essential (primary) hypertension: Secondary | ICD-10-CM | POA: Diagnosis not present

## 2019-03-04 DIAGNOSIS — Z760 Encounter for issue of repeat prescription: Secondary | ICD-10-CM | POA: Diagnosis not present

## 2019-03-04 DIAGNOSIS — M545 Low back pain: Secondary | ICD-10-CM | POA: Insufficient documentation

## 2019-03-04 DIAGNOSIS — I252 Old myocardial infarction: Secondary | ICD-10-CM | POA: Insufficient documentation

## 2019-03-04 DIAGNOSIS — G8929 Other chronic pain: Secondary | ICD-10-CM | POA: Diagnosis not present

## 2019-03-04 MED ORDER — OXYCODONE-ACETAMINOPHEN 10-325 MG PO TABS: 1.0000 | ORAL_TABLET | Freq: Four times a day (QID) | ORAL | 0 refills | Status: DC | PRN

## 2019-03-04 NOTE — Telephone Encounter (Signed)
PMP was Reviewed: [facility] was called, they never received the Oxycodone prescription. Oxycodone was e-scribed. Today. Placed a call to Mr. Najjar regarding the above, he verbalizes understanding.

## 2019-03-04 NOTE — Telephone Encounter (Signed)
Prior authorization submitted via [employer] for oxycodone-acetaminophen 10-325mg .  Awaiting response

## 2019-03-04 NOTE — Progress Notes (Signed)
Subjective:    Patient ID: George Villa, male    DOB: 1965/05/03, 53 y.o.   MRN: [id number]  HPI: George Villa is a 53 y.o. male who returns for follow up appointment for chronic pain and medication refill. He states his pain is located in his lower back.He  rates his pain 8. His current exercise regime is walking and riding his bicycle.   Mr. Gatlin Morphine equivalent is 60.00 MME.  Last UDS was Performed on 02/04/2019, it was consistent.   Pain Inventory Average Pain 8 Pain Right Now 8 My pain is dull  In the last 24 hours, has pain interfered with the following? General activity 7 Relation with others 7 Enjoyment of life 7 What TIME of day is your pain at its worst? night Sleep (in general) NA  Pain is worse with: walking and standing Pain improves with: medication Relief from Meds: na  Mobility use a cane ability to climb steps?  yes do you drive?  yes  Function disabled: date disabled .  Neuro/Psych trouble walking  Prior Studies Any changes since last visit?  no  Physicians involved in your care Any changes since last visit?  no   Family History  Problem Relation Age of Onset  . Heart Problems Father   . Hypertension Mother   . Stroke Other    Social History   Socioeconomic History  . Marital status: Single    Spouse name: Not on file  . Number of children: Not on file  . Years of education: Not on file  . Highest education level: Not on file  Occupational History  . Occupation: umemployed     Comment: Works in [employer]  . Financial resource strain: Not on file  . Food insecurity    Worry: Not on file    Inability: Not on file  . Transportation needs    Medical: Not on file    Non-medical: Not on file  Tobacco Use  . Smoking status: Never Smoker  . Smokeless tobacco: Never Used  Substance and Sexual Activity  . Alcohol use: Yes    Alcohol/week: 6.0 standard drinks    Types: 6 Cans of beer per week    Comment:  OCCASSIONALLY   . Drug use: No  . Sexual activity: Yes  Lifestyle  . Physical activity    Days per week: Not on file    Minutes per session: Not on file  . Stress: Not on file  Relationships  . Social Herbalist on phone: Not on file    Gets together: Not on file    Attends religious service: Not on file    Active member of club or organization: Not on file    Attends meetings of clubs or organizations: Not on file    Relationship status: Not on file  Other Topics Concern  . Not on file  Social History Narrative   7 cups of caffeine a week    Past Surgical History:  Procedure Laterality Date  . BACK SURGERY    . BRAIN HEMATOMA EVACUATION  2006  . LEFT HEART CATHETERIZATION WITH CORONARY ANGIOGRAM N/A 02/17/2014   Procedure: LEFT HEART CATHETERIZATION WITH CORONARY ANGIOGRAM;  Surgeon: Sinclair Grooms, MD;  Location: [facility] CATH LAB;  Service: Cardiovascular;  Laterality: N/A;  . LUMBAR [place] SURGERY  2013  . PERCUTANEOUS CORONARY STENT INTERVENTION (PCI-S)  02/17/2014   Procedure: PERCUTANEOUS CORONARY STENT INTERVENTION (PCI-S);  Surgeon:  [name], MD;  Location: [facility] CATH LAB;  Service: Cardiovascular;;  . TRACHEOSTOMY  05/2004  . VENTRICULOSTOMY  2006   Past Medical History:  Diagnosis Date  . Acid reflux   . 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  . Chronic lower back pain   . Headache    "monthly" (02/18/2014)  . Hypertension   . 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 Stroke [facility]) 2006   "left leg weak since" (02/18/2014)   BP 139/87   Pulse 76   Ht 5\' 7"  (1.702 m)   Wt 200 lb (90.7 kg)   SpO2 97%   BMI 31.32 kg/m   Opioid Risk Score:   Fall Risk Score:  `1  Depression screen PHQ 2/9  Depression screen [facility] 2/9 03/04/2019 01/05/2019 09/10/2018 08/05/2018 07/17/2018 08/28/2017 05/24/2017  Decreased Interest 0 0 0 0 0 0 0  Down, Depressed, Hopeless 0 0 0 0 0 0 0  PHQ  - 2 Score 0 0 0 0 0 0 0   Review of Systems  Constitutional: Negative.   HENT: Negative.   Eyes: Negative.   Respiratory: Negative.   Cardiovascular: Negative.   Gastrointestinal: Negative.   Endocrine: Negative.   Genitourinary: Positive for difficulty urinating.  Musculoskeletal: Positive for gait problem.  Allergic/Immunologic: Negative.   Hematological: Negative.   Psychiatric/Behavioral: Negative.   All other systems reviewed and are negative.      Objective:   Physical Exam Vitals signs and nursing note reviewed.  Constitutional:      Appearance: Normal appearance.  Neck:     Musculoskeletal: Normal range of motion and neck supple.  Cardiovascular:     Rate and Rhythm: Normal rate and regular rhythm.     Pulses: Normal pulses.     Heart sounds: Normal heart sounds.  Pulmonary:     Effort: Pulmonary effort is normal.     Breath sounds: Normal breath sounds.  Musculoskeletal:     Comments: Normal Muscle Bulk and Muscle Testing Reveals:  Upper Extremities: Full ROM and Muscle Strength 5/5 , Lumbar Paraspinal Tenderness: L-3-L-5 Lower Extremities: Full ROM and Muscle Strength 5/5 Arises from Table slowly using cane for support Wide Based  Gait   Skin:    General: Skin is warm and dry.  Neurological:     Mental Status: He is alert and oriented to person, place, and time.  Psychiatric:        Mood and Affect: Mood normal.        Behavior: Behavior normal.           Assessment & Plan:  1. History of multiple strokes with gait disorder, ongoing ataxia andspasticleft hemiparesis: ContinueHEP as Tolerated. 03/04/2019 2. Chronic low back pain with documented facet arthropathy and history of intradural defect/schwanomma requiring resection, per Dr. 03/06/2019 Note. Continue current medication regimen. Refilled:Percocet10/325 one q6 prn #120.03/04/2019 We will continue the opioid monitoring program, this consists of regular clinic visits, examinations, routine  drug screening, pill counts as well as use of 03/06/2019 Controlled Substance Reporting System. NCCSRS was reviewed.  [place] of face to face patient care time was spent during this visit. All questions were encouraged and answered.  F/U in 1 month

## 2019-03-04 NOTE — Telephone Encounter (Signed)
Placed another call to [facility], they are not receving [name] prescription. Have spoken with the pharmacist, we will have [name] come to the office and print him a prescription, he verbalizes understanding. Placed a call to [name] regarding the above, he will come to office to pick up his Villa prescription.

## 2019-03-09 NOTE — Telephone Encounter (Signed)
Approved  03/05/2019-09/01/2019

## 2019-03-10 DIAGNOSIS — I10 Essential (primary) hypertension: Secondary | ICD-10-CM | POA: Diagnosis not present

## 2019-03-10 DIAGNOSIS — Z1159 Encounter for screening for other viral diseases: Secondary | ICD-10-CM | POA: Diagnosis not present

## 2019-03-10 DIAGNOSIS — I639 Cerebral infarction, unspecified: Secondary | ICD-10-CM | POA: Diagnosis not present

## 2019-03-10 DIAGNOSIS — E559 Vitamin D deficiency, unspecified: Secondary | ICD-10-CM | POA: Diagnosis not present

## 2019-03-10 DIAGNOSIS — K219 Gastro-esophageal reflux disease without esophagitis: Secondary | ICD-10-CM | POA: Diagnosis not present

## 2019-03-10 DIAGNOSIS — E789 Disorder of lipoprotein metabolism, unspecified: Secondary | ICD-10-CM | POA: Diagnosis not present

## 2019-04-01 ENCOUNTER — Other Ambulatory Visit: Payer: Self-pay

## 2019-04-01 ENCOUNTER — Encounter: Payer: Self-pay | Admitting: Registered Nurse

## 2019-04-01 ENCOUNTER — Encounter: Payer: Medicaid Other | Attending: Physical Medicine & Rehabilitation | Admitting: Registered Nurse

## 2019-04-01 DIAGNOSIS — Z79891 Long term (current) use of opiate analgesic: Secondary | ICD-10-CM | POA: Insufficient documentation

## 2019-04-01 DIAGNOSIS — I252 Old myocardial infarction: Secondary | ICD-10-CM | POA: Insufficient documentation

## 2019-04-01 DIAGNOSIS — M545 Low back pain: Secondary | ICD-10-CM | POA: Insufficient documentation

## 2019-04-01 DIAGNOSIS — G8929 Other chronic pain: Secondary | ICD-10-CM | POA: Diagnosis not present

## 2019-04-01 DIAGNOSIS — M47816 Spondylosis without myelopathy or radiculopathy, lumbar region: Secondary | ICD-10-CM | POA: Diagnosis not present

## 2019-04-01 DIAGNOSIS — Z955 Presence of coronary angioplasty implant and graft: Secondary | ICD-10-CM | POA: Insufficient documentation

## 2019-04-01 DIAGNOSIS — Z5181 Encounter for therapeutic drug level monitoring: Secondary | ICD-10-CM | POA: Diagnosis not present

## 2019-04-01 DIAGNOSIS — Z760 Encounter for issue of repeat prescription: Secondary | ICD-10-CM | POA: Insufficient documentation

## 2019-04-01 DIAGNOSIS — R262 Difficulty in walking, not elsewhere classified: Secondary | ICD-10-CM | POA: Insufficient documentation

## 2019-04-01 DIAGNOSIS — I10 Essential (primary) hypertension: Secondary | ICD-10-CM | POA: Diagnosis not present

## 2019-04-01 DIAGNOSIS — Z8673 Personal history of transient ischemic attack (TIA), and cerebral infarction without residual deficits: Secondary | ICD-10-CM | POA: Diagnosis not present

## 2019-04-01 MED ORDER — OXYCODONE-ACETAMINOPHEN 10-325 MG PO TABS: 1.0000 | ORAL_TABLET | Freq: Four times a day (QID) | ORAL | 0 refills | Status: DC | PRN

## 2019-04-01 NOTE — Progress Notes (Signed)
Subjective:    Patient ID: George Villa, male    DOB: [birth year], 53 y.o.   MRN: [id number]  HPI: George Villa is a 53 y.o. male who returns for follow up appointment for chronic pain and medication refill. He states his pain is located in his lower back. He rates his pain 8. His current exercise regime is walking and riding his stationary bicycle daily for 30 minutes.   George Villa Morphine equivalent is 60.00  MME.  Last UDS was Performed on 02/04/2019, it was consistent.   Pain Inventory Average Pain 8 Pain Right Now 8 My pain is dull  In the last 24 hours, has pain interfered with the following? General activity 7 Relation with others 7 Enjoyment of life 7 What TIME of day is your pain at its worst? night Sleep (in general) Fair  Pain is worse with: walking, bending and standing Pain improves with: medication Relief from Meds: 9  Mobility use a cane  Function disabled: date disabled .  Neuro/Psych weakness  Prior Studies Any changes since last visit?  no  Physicians involved in your care Any changes since last visit?  no   Family History  Problem Relation Age of Onset  . Heart Problems Father   . Hypertension Mother   . Stroke Other    Social History   Socioeconomic History  . Marital status: Single    Spouse name: Not on file  . Number of children: Not on file  . Years of education: Not on file  . Highest education level: Not on file  Occupational History  . Occupation: umemployed     Comment: Works in [employer]  . Smoking status: Never Smoker  . Smokeless tobacco: Never Used  Substance and Sexual Activity  . Alcohol use: Yes    Alcohol/week: 6.0 standard drinks    Types: 6 Cans of beer per week    Comment: OCCASSIONALLY   . Drug use: No  . Sexual activity: Yes  Other Topics Concern  . Not on file  Social History Narrative   7 cups of caffeine a week    Social Determinants of Health   Financial Resource Strain:   .  Difficulty of Paying Living Expenses: Not on file  Food Insecurity:   . Worried About Programme researcher, broadcasting/film/video in the Last Year: Not on file  . Ran Out of Food in the Last Year: Not on file  Transportation Needs:   . Lack of Transportation (Medical): Not on file  . Lack of Transportation (Non-Medical): Not on file  Physical Activity:   . Days of Exercise per Week: Not on file  . Minutes of Exercise per Session: Not on file  Stress:   . Feeling of Stress : Not on file  Social Connections:   . Frequency of Communication with Friends and Family: Not on file  . Frequency of Social Gatherings with Friends and Family: Not on file  . Attends Religious Services: Not on file  . Active Member of Clubs or Organizations: Not on file  . Attends Banker Meetings: Not on file  . Marital Status: Not on file   Past Surgical History:  Procedure Laterality Date  . BACK SURGERY    . BRAIN HEMATOMA EVACUATION  2006  . LEFT HEART CATHETERIZATION WITH CORONARY ANGIOGRAM N/A 02/17/2014   Procedure: LEFT HEART CATHETERIZATION WITH CORONARY ANGIOGRAM;  Surgeon: [name], MD;  Location: [facility] CATH LAB;  Service: Cardiovascular;  Laterality: N/A;  . LUMBAR [place] SURGERY  2013  . PERCUTANEOUS CORONARY STENT INTERVENTION (PCI-S)  02/17/2014   Procedure: PERCUTANEOUS CORONARY STENT INTERVENTION (PCI-S);  Surgeon: Sinclair Grooms, MD;  Location: [facility] CATH LAB;  Service: Cardiovascular;;  . TRACHEOSTOMY  05/2004  . VENTRICULOSTOMY  2006   Past Medical History:  Diagnosis Date  . Acid reflux   . 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  . Chronic lower back pain   . Headache    "monthly" (02/18/2014)  . Hypertension   . 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 Stroke [facility]) 2006   "left leg weak since" (02/18/2014)   There were no vitals taken for this visit.  Opioid Risk Score:   Fall Risk Score:  `1  Depression  screen PHQ 2/9  Depression screen [facility] 2/9 03/04/2019 01/05/2019 09/10/2018 08/05/2018 07/17/2018 08/28/2017 05/24/2017  Decreased Interest 0 0 0 0 0 0 0  Down, Depressed, Hopeless 0 0 0 0 0 0 0  PHQ - 2 Score 0 0 0 0 0 0 0    Review of Systems  Constitutional: Negative.   HENT: Negative.   Eyes: Negative.   Respiratory: Negative.   Cardiovascular: Negative.   Gastrointestinal: Negative.   Endocrine: Negative.   Genitourinary: Positive for difficulty urinating.  Musculoskeletal: Positive for arthralgias, back pain and gait problem.  Skin: Negative.   Allergic/Immunologic: Negative.   Neurological: Positive for weakness.  Hematological: Negative.   Psychiatric/Behavioral: Negative.   All other systems reviewed and are negative.      Objective:   Physical Exam Vitals and nursing note reviewed.  Constitutional:      Appearance: Normal appearance.  Cardiovascular:     Rate and Rhythm: Normal rate and regular rhythm.     Pulses: Normal pulses.     Heart sounds: Normal heart sounds.  Pulmonary:     Effort: Pulmonary effort is normal.     Breath sounds: Normal breath sounds.  Musculoskeletal:     Cervical back: Normal range of motion and neck supple.     Comments: Normal Muscle Bulk and Muscle Testing Reveals:  Upper Extremities: Full ROM and Muscle Strength 5/5  Lumbar Paraspinal Tenderness: L-3-L-5 Lower Extremities: Full ROM and Muscle Strength 5/5 Arises from chair slowly using cane for support Wide Based Antalgic Gait   Skin:    General: Skin is warm and dry.  Neurological:     Mental Status: He is alert and oriented to person, place, and time.  Psychiatric:        Mood and Affect: Mood normal.        Behavior: Behavior normal.           Assessment & Plan:  1. History of multiple strokes with gait disorder, ongoing ataxia andspasticleft hemiparesis: ContinueHEP as Tolerated.04/01/2019 2. Chronic low back pain with documented facet arthropathy and history of  intradural defect/schwanomma requiring resection, per Dr. Naaman Plummer Note. Continue current medication regimen. Refilled:Percocet10/325 one q6 prn #120.04/01/2019 We will continue the opioid monitoring program, this consists of regular clinic visits, examinations, routine drug screening, pill counts as well as use of [place] Controlled Substance Reporting System. NCCSRS was reviewed.  44minutes of face to face patient care time was spent during this visit. All questions were encouraged and answered.  F/U in 1 month

## 2019-04-29 ENCOUNTER — Other Ambulatory Visit: Payer: Self-pay

## 2019-04-29 ENCOUNTER — Encounter: Payer: Medicaid Other | Attending: Physical Medicine & Rehabilitation | Admitting: Registered Nurse

## 2019-04-29 ENCOUNTER — Encounter: Payer: Self-pay | Admitting: Registered Nurse

## 2019-04-29 VITALS — BP 144/93 | HR 82 | Temp 97.9°F | Ht 67.0 in | Wt 202.0 lb

## 2019-04-29 DIAGNOSIS — G8929 Other chronic pain: Secondary | ICD-10-CM | POA: Insufficient documentation

## 2019-04-29 DIAGNOSIS — Z5181 Encounter for therapeutic drug level monitoring: Secondary | ICD-10-CM | POA: Diagnosis not present

## 2019-04-29 DIAGNOSIS — I10 Essential (primary) hypertension: Secondary | ICD-10-CM | POA: Insufficient documentation

## 2019-04-29 DIAGNOSIS — I252 Old myocardial infarction: Secondary | ICD-10-CM | POA: Diagnosis not present

## 2019-04-29 DIAGNOSIS — M47816 Spondylosis without myelopathy or radiculopathy, lumbar region: Secondary | ICD-10-CM

## 2019-04-29 DIAGNOSIS — G894 Chronic pain syndrome: Secondary | ICD-10-CM | POA: Diagnosis not present

## 2019-04-29 DIAGNOSIS — Z760 Encounter for issue of repeat prescription: Secondary | ICD-10-CM | POA: Insufficient documentation

## 2019-04-29 DIAGNOSIS — R269 Unspecified abnormalities of gait and mobility: Secondary | ICD-10-CM

## 2019-04-29 DIAGNOSIS — Z955 Presence of coronary angioplasty implant and graft: Secondary | ICD-10-CM | POA: Insufficient documentation

## 2019-04-29 DIAGNOSIS — Z79891 Long term (current) use of opiate analgesic: Secondary | ICD-10-CM

## 2019-04-29 DIAGNOSIS — R262 Difficulty in walking, not elsewhere classified: Secondary | ICD-10-CM | POA: Insufficient documentation

## 2019-04-29 DIAGNOSIS — M545 Low back pain: Secondary | ICD-10-CM | POA: Diagnosis not present

## 2019-04-29 DIAGNOSIS — Z8673 Personal history of transient ischemic attack (TIA), and cerebral infarction without residual deficits: Secondary | ICD-10-CM | POA: Diagnosis not present

## 2019-04-29 MED ORDER — OXYCODONE-ACETAMINOPHEN 10-325 MG PO TABS: 1.0000 | ORAL_TABLET | Freq: Four times a day (QID) | ORAL | 0 refills | Status: DC | PRN
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
## 2019-05-14 DIAGNOSIS — E789 Disorder of lipoprotein metabolism, unspecified: Secondary | ICD-10-CM | POA: Diagnosis not present

## 2019-05-14 DIAGNOSIS — Z1159 Encounter for screening for other viral diseases: Secondary | ICD-10-CM | POA: Diagnosis not present

## 2019-05-14 DIAGNOSIS — E291 Testicular hypofunction: Secondary | ICD-10-CM | POA: Diagnosis not present

## 2019-05-14 DIAGNOSIS — D539 Nutritional anemia, unspecified: Secondary | ICD-10-CM | POA: Diagnosis not present

## 2019-05-14 DIAGNOSIS — I10 Essential (primary) hypertension: Secondary | ICD-10-CM | POA: Diagnosis not present

## 2019-05-14 DIAGNOSIS — E559 Vitamin D deficiency, unspecified: Secondary | ICD-10-CM | POA: Diagnosis not present

## 2019-05-14 DIAGNOSIS — K219 Gastro-esophageal reflux disease without esophagitis: Secondary | ICD-10-CM | POA: Diagnosis not present

## 2019-05-14 DIAGNOSIS — I639 Cerebral infarction, unspecified: Secondary | ICD-10-CM | POA: Diagnosis not present

## 2019-05-14 DIAGNOSIS — R5383 Other fatigue: Secondary | ICD-10-CM | POA: Diagnosis not present

## 2019-05-27 ENCOUNTER — Telehealth: Payer: Self-pay | Admitting: Registered Nurse

## 2019-05-27 DIAGNOSIS — M47816 Spondylosis without myelopathy or radiculopathy, lumbar region: Secondary | ICD-10-CM

## 2019-05-27 MED ORDER — OXYCODONE-ACETAMINOPHEN 10-325 MG PO TABS: 1.0000 | ORAL_TABLET | Freq: Four times a day (QID) | ORAL | 0 refills | Status: DC | PRN
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
## 2019-05-28 ENCOUNTER — Encounter: Payer: Self-pay | Admitting: Registered Nurse

## 2019-05-28 ENCOUNTER — Other Ambulatory Visit: Payer: Self-pay

## 2019-05-28 ENCOUNTER — Encounter: Payer: Medicaid Other | Attending: Physical Medicine & Rehabilitation | Admitting: Registered Nurse

## 2019-05-28 VITALS — BP 119/89 | HR 95 | Ht 67.0 in | Wt 201.0 lb

## 2019-05-28 DIAGNOSIS — R262 Difficulty in walking, not elsewhere classified: Secondary | ICD-10-CM | POA: Insufficient documentation

## 2019-05-28 DIAGNOSIS — I10 Essential (primary) hypertension: Secondary | ICD-10-CM | POA: Insufficient documentation

## 2019-05-28 DIAGNOSIS — Z5181 Encounter for therapeutic drug level monitoring: Secondary | ICD-10-CM | POA: Diagnosis not present

## 2019-05-28 DIAGNOSIS — R269 Unspecified abnormalities of gait and mobility: Secondary | ICD-10-CM

## 2019-05-28 DIAGNOSIS — Z79891 Long term (current) use of opiate analgesic: Secondary | ICD-10-CM | POA: Insufficient documentation

## 2019-05-28 DIAGNOSIS — G8929 Other chronic pain: Secondary | ICD-10-CM | POA: Insufficient documentation

## 2019-05-28 DIAGNOSIS — Z760 Encounter for issue of repeat prescription: Secondary | ICD-10-CM | POA: Insufficient documentation

## 2019-05-28 DIAGNOSIS — M545 Low back pain: Secondary | ICD-10-CM | POA: Insufficient documentation

## 2019-05-28 DIAGNOSIS — M47816 Spondylosis without myelopathy or radiculopathy, lumbar region: Secondary | ICD-10-CM | POA: Insufficient documentation

## 2019-05-28 DIAGNOSIS — G894 Chronic pain syndrome: Secondary | ICD-10-CM

## 2019-05-28 DIAGNOSIS — Z8673 Personal history of transient ischemic attack (TIA), and cerebral infarction without residual deficits: Secondary | ICD-10-CM | POA: Insufficient documentation

## 2019-05-28 DIAGNOSIS — Z955 Presence of coronary angioplasty implant and graft: Secondary | ICD-10-CM | POA: Insufficient documentation

## 2019-05-28 DIAGNOSIS — I252 Old myocardial infarction: Secondary | ICD-10-CM | POA: Insufficient documentation

## 2019-05-28 NOTE — Progress Notes (Signed)
Subjective:    Patient ID: George Villa, male    DOB: [birth year], 54 y.o.   MRN: [id number]  HPI: George Villa is a 54 y.o. male whose appointment was changed to a virtual office visit to reduce the risk of exposure to the COVID-19 virus and to help George Villa remain healthy and safe. The virtual visit will also provide continuity of care. George Villa agrees with virtual visit and verbalizes understanding. He  States his pain is located in his    . He rates his pain 7. His current exercise regime is walking and performing stretching exercises.  George Villa Morphine equivalent is 60.00 MME.  Last UDS was Performed on 02/04/2019, it was consistent.   George Villa CMA asked The Health and History Questions, this provider and George Villa verified we were speaking with the correct person using two identifiers.     Pain Inventory Average Pain 7 Pain Right Now 7 My pain is dull and aching  In the last 24 hours, has pain interfered with the following? General activity 7 Relation with others 7 Enjoyment of life 7 What TIME of day is your pain at its worst? night Sleep (in general) Fair  Pain is worse with: walking, standing and some activites Pain improves with: rest, pacing activities and medication Relief from Meds: 6  Mobility walk with assistance use a cane ability to climb steps?  yes do you drive?  yes  Function disabled: date disabled .  Neuro/Psych trouble walking  Prior Studies Any changes since last visit?  no  Physicians involved in your care Any changes since last visit?  no   Family History  Problem Relation Age of Onset  . Heart Problems Father   . Hypertension Mother   . Stroke Other    Social History   Socioeconomic History  . Marital status: Single    Spouse name: Not on file  . Number of children: Not on file  . Years of education: Not on file  . Highest education level: Not on file  Occupational History  . Occupation: umemployed    Comment: Works in [employer]  . Smoking status: Never Smoker  . Smokeless tobacco: Never Used  Substance and Sexual Activity  . Alcohol use: Yes    Alcohol/week: 6.0 standard drinks    Types: 6 Cans of beer per week    Comment: OCCASSIONALLY   . Drug use: No  . Sexual activity: Yes  Other Topics Concern  . Not on file  Social History Narrative   7 cups of caffeine a week    Social Determinants of Health   Financial Resource Strain:   . Difficulty of Paying Living Expenses: Not on file  Food Insecurity:   . Worried About Charity fundraiser in the Last Year: Not on file  . Ran Out of Food in the Last Year: Not on file  Transportation Needs:   . Lack of Transportation (Medical): Not on file  . Lack of Transportation (Non-Medical): Not on file  Physical Activity:   . Days of Exercise per Week: Not on file  . Minutes of Exercise per Session: Not on file  Stress:   . Feeling of Stress : Not on file  Social Connections:   . Frequency of Communication with Friends and Family: Not on file  . Frequency of Social Gatherings with Friends and Family: Not on file  . Attends Religious Services: Not on file  . Active  Member of Clubs or Organizations: Not on file  . Attends Banker Meetings: Not on file  . Marital Status: Not on file   Past Surgical History:  Procedure Laterality Date  . BACK SURGERY    . BRAIN HEMATOMA EVACUATION  2006  . LEFT HEART CATHETERIZATION WITH CORONARY ANGIOGRAM N/A 02/17/2014   Procedure: LEFT HEART CATHETERIZATION WITH CORONARY ANGIOGRAM;  Surgeon: [name], MD;  Location: [facility] CATH LAB;  Service: Cardiovascular;  Laterality: N/A;  . LUMBAR DISC SURGERY  2013  . PERCUTANEOUS CORONARY STENT INTERVENTION (PCI-S)  02/17/2014   Procedure: PERCUTANEOUS CORONARY STENT INTERVENTION (PCI-S);  Surgeon: [name], MD;  [facility] C[facility] LAB;  Service: Cardiovascular;;  . TRACHEOSTOMY  05/2004  . VENTRICULOSTOMY  2006   Past  Medical History:  Diagnosis Date  . Acid reflux   . 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  . Chronic lower back pain   . Headache    "monthly" (02/18/2014)  . Hypertension   . 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 Stroke [facility]) 2006   "left leg weak since" (02/18/2014)   There were no vitals taken for this visit.  Opioid Risk Score:   Fall Risk Score:  `1  Depression screen PHQ 2/9  Depression screen [facility] 2/9 03/04/2019 01/05/2019 09/10/2018 08/05/2018 07/17/2018 08/28/2017 05/24/2017  Decreased Interest 0 0 0 0 0 0 0  Down, Depressed, Hopeless 0 0 0 0 0 0 0  PHQ - 2 Score 0 0 0 0 0 0 0     Review of Systems  Constitutional: Negative.   HENT: Negative.   Eyes: Negative.   Respiratory: Negative.   Cardiovascular: Negative.   Gastrointestinal: Negative.   Endocrine: Negative.   Genitourinary: Negative.   Musculoskeletal: Positive for arthralgias and gait problem.  Skin: Negative.   Allergic/Immunologic: Negative.   Hematological: Negative.   Psychiatric/Behavioral: Negative.   All other systems reviewed and are negative.      Objective:   Physical Exam Vitals and nursing note reviewed.  Musculoskeletal:     Comments: No Physical Exam Performed Virtual Visit           Assessment & Plan:  1. History of multiple strokes with gait disorder, ongoing ataxia andspasticleft hemiparesis: ContinueHEP as Tolerated.05/28/2019 2. Chronic low back pain with documented facet arthropathy and history of intradural defect/schwanomma requiring resection, per Dr. Riley Kill Note. Continue current medication regimen. Refilled:Percocet10/325 one q6 prn #120.05/28/2019 We will continue the opioid monitoring program, this consists of regular clinic visits, examinations, routine drug screening, pill counts as well as use of [place] Controlled Substance Reporting System. [place]CC[place] was  reviewed.  F/U in 1 month  Tele-Health Visit Telephone Call Established Patient Location of Patient: In His Home Location of Provider: In Her Home Total Time Spent: 10 Minutes

## 2019-06-18 ENCOUNTER — Other Ambulatory Visit: Payer: Self-pay

## 2019-06-18 ENCOUNTER — Telehealth: Payer: Self-pay

## 2019-06-18 ENCOUNTER — Encounter: Payer: Medicaid Other | Attending: Physical Medicine & Rehabilitation | Admitting: Registered Nurse

## 2019-06-18 ENCOUNTER — Encounter: Payer: Self-pay | Admitting: Registered Nurse

## 2019-06-18 VITALS — BP 130/90 | HR 89 | Temp 97.5°F | Ht 67.0 in | Wt 200.0 lb

## 2019-06-18 DIAGNOSIS — M47816 Spondylosis without myelopathy or radiculopathy, lumbar region: Secondary | ICD-10-CM | POA: Insufficient documentation

## 2019-06-18 DIAGNOSIS — I10 Essential (primary) hypertension: Secondary | ICD-10-CM | POA: Insufficient documentation

## 2019-06-18 DIAGNOSIS — G894 Chronic pain syndrome: Secondary | ICD-10-CM | POA: Insufficient documentation

## 2019-06-18 DIAGNOSIS — R269 Unspecified abnormalities of gait and mobility: Secondary | ICD-10-CM | POA: Diagnosis not present

## 2019-06-18 DIAGNOSIS — I252 Old myocardial infarction: Secondary | ICD-10-CM | POA: Diagnosis not present

## 2019-06-18 DIAGNOSIS — Z760 Encounter for issue of repeat prescription: Secondary | ICD-10-CM | POA: Diagnosis not present

## 2019-06-18 DIAGNOSIS — R262 Difficulty in walking, not elsewhere classified: Secondary | ICD-10-CM | POA: Insufficient documentation

## 2019-06-18 DIAGNOSIS — M545 Low back pain: Secondary | ICD-10-CM | POA: Insufficient documentation

## 2019-06-18 DIAGNOSIS — Z79891 Long term (current) use of opiate analgesic: Secondary | ICD-10-CM | POA: Insufficient documentation

## 2019-06-18 DIAGNOSIS — Z5181 Encounter for therapeutic drug level monitoring: Secondary | ICD-10-CM | POA: Diagnosis not present

## 2019-06-18 DIAGNOSIS — Z8673 Personal history of transient ischemic attack (TIA), and cerebral infarction without residual deficits: Secondary | ICD-10-CM | POA: Diagnosis not present

## 2019-06-18 DIAGNOSIS — Z955 Presence of coronary angioplasty implant and graft: Secondary | ICD-10-CM | POA: Diagnosis not present

## 2019-06-18 DIAGNOSIS — G8929 Other chronic pain: Secondary | ICD-10-CM | POA: Insufficient documentation

## 2019-06-18 MED ORDER — OXYCODONE-ACETAMINOPHEN 10-325 MG PO TABS: 1.0000 | ORAL_TABLET | Freq: Four times a day (QID) | ORAL | 0 refills | Status: DC | PRN

## 2019-06-18 NOTE — Progress Notes (Signed)
Subjective:    Patient ID: George Villa, male    DOB: [birth year], 54 y.o.   MRN: [id number]  HPI: George Villa is a 54 y.o. male who returns for follow up appointment for chronic pain and medication refill. He states his pain is located in his lower back. He rates his pain 8. His current exercise regime is walking and riding his stationary bicycle for 35 minutes 5 days a week.  Mr. Krach Morphine equivalent is 60.00  MME.  UDS ordered for today.   Pain Inventory Average Pain 8 Pain Right Now 8 My pain is dull  In the last 24 hours, has pain interfered with the following? General activity 8 Relation with others 8 Enjoyment of life 7 What TIME of day is your pain at its worst? night Sleep (in general) Fair  Pain is worse with: walking and standing Pain improves with: medication Relief from Meds: .  Mobility use a cane ability to climb steps?  yes do you drive?  yes  Function disabled: date disabled .  Neuro/Psych weakness  Prior Studies Any changes since last visit?  no  Physicians involved in your care Any changes since last visit?  no   Family History  Problem Relation Age of Onset  . Heart Problems Father   . Hypertension Mother   . Stroke Other    Social History   Socioeconomic History  . Marital status: Single    Spouse name: Not on file  . Number of children: Not on file  . Years of education: Not on file  . Highest education level: Not on file  Occupational History  . Occupation: umemployed     Comment: Works in [employer]  . Smoking status: Never Smoker  . Smokeless tobacco: Never Used  Substance and Sexual Activity  . Alcohol use: Yes    Alcohol/week: 6.0 standard drinks    Types: 6 Cans of beer per week    Comment: OCCASSIONALLY   . Drug use: No  . Sexual activity: Yes  Other Topics Concern  . Not on file  Social History Narrative   7 cups of caffeine a week    Social Determinants of Health   Financial Resource  Strain:   . Difficulty of Paying Living Expenses:   Food Insecurity:   . Worried About Programme researcher, broadcasting/film/video in the Last Year:   . Barista in the Last Year:   Transportation Needs:   . Freight forwarder (Medical):   Marland Kitchen Lack of Transportation (Non-Medical):   Physical Activity:   . Days of Exercise per Week:   . Minutes of Exercise per Session:   Stress:   . Feeling of Stress :   Social Connections:   . Frequency of Communication with Friends and Family:   . Frequency of Social Gatherings with Friends and Family:   . Attends Religious Services:   . Active Member of Clubs or Organizations:   . Attends Banker Meetings:   Marland Kitchen Marital Status:    Past Surgical History:  Procedure Laterality Date  . BACK SURGERY    . BRAIN HEMATOMA EVACUATION  2006  . LEFT HEART CATHETERIZATION WITH CORONARY ANGIOGRAM N/A 02/17/2014   Procedure: LEFT HEART CATHETERIZATION WITH CORONARY ANGIOGRAM;  Surgeon: [name], MD;  Location: [facility] CATH LAB;  Service: Cardiovascular;  Laterality: N/A;  . LUMBAR DISC SURGERY  2013  . PERCUTANEOUS CORONARY STENT INTERVENTION (PCI-S)  02/17/2014  Procedure: PERCUTANEOUS CORONARY STENT INTERVENTION (PCI-S);  Surgeon: Sinclair Grooms, MD;  Location: [facility] CATH LAB;  Service: Cardiovascular;;  . TRACHEOSTOMY  05/2004  . VENTRICULOSTOMY  2006   Past Medical History:  Diagnosis Date  . Acid reflux   . 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  . Chronic lower back pain   . Headache    "monthly" (02/18/2014)  . Hypertension   . 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 Stroke [facility]) 2006   "left leg weak since" (02/18/2014)   BP 130/90   Pulse 89   Temp (!) 97.5 F (36.4 C)   Ht 5\' 7"  (1.702 m)   Wt 200 lb (90.7 kg)   SpO2 97%   BMI 31.32 kg/m   Opioid Risk Score:   Fall Risk Score:  `1  Depression screen PHQ 2/9  Depression screen [facility] 2/9 03/04/2019  01/05/2019 09/10/2018 08/05/2018 07/17/2018 08/28/2017 05/24/2017  Decreased Interest 0 0 0 0 0 0 0  Down, Depressed, Hopeless 0 0 0 0 0 0 0  PHQ - 2 Score 0 0 0 0 0 0 0     Review of Systems  Constitutional: Negative.   HENT: Negative.   Eyes: Negative.   Respiratory: Negative.   Cardiovascular: Negative.   Gastrointestinal: Negative.   Endocrine: Negative.   Genitourinary: Positive for difficulty urinating.  Musculoskeletal: Positive for arthralgias and back pain.  Skin: Negative.   Allergic/Immunologic: Negative.   Neurological: Positive for weakness.  Hematological: Negative.   Psychiatric/Behavioral: Negative.   All other systems reviewed and are negative.      Objective:   Physical Exam Vitals and nursing note reviewed.  Constitutional:      Appearance: Normal appearance.  Cardiovascular:     Rate and Rhythm: Normal rate and regular rhythm.     Pulses: Normal pulses.     Heart sounds: Normal heart sounds.  Pulmonary:     Effort: Pulmonary effort is normal.     Breath sounds: Normal breath sounds.  Musculoskeletal:     Cervical back: Normal range of motion and neck supple.     Comments: Normal Muscle Bulk and Muscle Testing Reveals:  Upper Extremities: Full ROM and Muscle Strength 5/5  Lumbar Paraspinal Tenderness: L-4-L-5 Lower Extremities: Full ROM and Muscle Strength 5/5 Arises from chair slowly using cane for support Wide Based  Gait   Skin:    General: Skin is warm and dry.  Neurological:     Mental Status: He is alert and oriented to person, place, and time.  Psychiatric:        Mood and Affect: Mood normal.        Behavior: Behavior normal.           Assessment & Plan:  1. History of multiple strokes with gait disorder, ongoing ataxia andspasticleft hemiparesis: ContinueHEP as Tolerated.06/18/2019 2. Chronic low back pain with documented facet arthropathy and history of intradural defect/schwanomma requiring resection, per Dr. Naaman Plummer Note.  Continue current medication regimen. Refilled:Percocet10/325 one q6 prn #120.06/18/2019 We will continue the opioid monitoring program, this consists of regular clinic visits, examinations, routine drug screening, pill counts as well as use of [place] Controlled Substance Reporting System. [place]CCS[place] was reviewed.  F/U in 1 month  15 minutes of face to face patient care time was spent during this visit. All questions were encouraged and answered.

## 2019-06-18 NOTE — Telephone Encounter (Addendum)
Recieved faxed notification of need for prior auth for oxycodone-apap 10-325mg .  Started today 06-18-2019 at 305pm

## 2019-06-22 NOTE — Telephone Encounter (Signed)
Recieved approval today 06-22-2019 for patients oxycodone-apap tabs.  Exp is 12-15-2019.

## 2019-06-23 LAB — TOXASSURE SELECT,+ANTIDEPR,UR

## 2019-07-02 ENCOUNTER — Telehealth: Payer: Self-pay

## 2019-07-02 NOTE — Telephone Encounter (Signed)
UDS RESULTS CONSISTENT WITH MEDICATIONS ON FILE  

## 2019-07-20 ENCOUNTER — Other Ambulatory Visit: Payer: Self-pay

## 2019-07-20 ENCOUNTER — Encounter: Payer: Medicaid Other | Attending: Physical Medicine & Rehabilitation | Admitting: Registered Nurse

## 2019-07-20 ENCOUNTER — Encounter: Payer: Self-pay | Admitting: Registered Nurse

## 2019-07-20 VITALS — BP 145/99 | HR 69 | Temp 98.7°F | Ht 67.0 in | Wt 201.2 lb

## 2019-07-20 DIAGNOSIS — I252 Old myocardial infarction: Secondary | ICD-10-CM | POA: Diagnosis not present

## 2019-07-20 DIAGNOSIS — M545 Low back pain: Secondary | ICD-10-CM | POA: Diagnosis not present

## 2019-07-20 DIAGNOSIS — G8929 Other chronic pain: Secondary | ICD-10-CM | POA: Diagnosis not present

## 2019-07-20 DIAGNOSIS — M47816 Spondylosis without myelopathy or radiculopathy, lumbar region: Secondary | ICD-10-CM | POA: Diagnosis not present

## 2019-07-20 DIAGNOSIS — Z955 Presence of coronary angioplasty implant and graft: Secondary | ICD-10-CM | POA: Insufficient documentation

## 2019-07-20 DIAGNOSIS — R269 Unspecified abnormalities of gait and mobility: Secondary | ICD-10-CM | POA: Diagnosis not present

## 2019-07-20 DIAGNOSIS — I10 Essential (primary) hypertension: Secondary | ICD-10-CM | POA: Insufficient documentation

## 2019-07-20 DIAGNOSIS — G894 Chronic pain syndrome: Secondary | ICD-10-CM | POA: Insufficient documentation

## 2019-07-20 DIAGNOSIS — Z8673 Personal history of transient ischemic attack (TIA), and cerebral infarction without residual deficits: Secondary | ICD-10-CM | POA: Diagnosis not present

## 2019-07-20 DIAGNOSIS — R262 Difficulty in walking, not elsewhere classified: Secondary | ICD-10-CM | POA: Diagnosis not present

## 2019-07-20 DIAGNOSIS — Z760 Encounter for issue of repeat prescription: Secondary | ICD-10-CM | POA: Diagnosis not present

## 2019-07-20 DIAGNOSIS — Z79891 Long term (current) use of opiate analgesic: Secondary | ICD-10-CM | POA: Diagnosis not present

## 2019-07-20 DIAGNOSIS — Z5181 Encounter for therapeutic drug level monitoring: Secondary | ICD-10-CM

## 2019-07-20 MED ORDER — OXYCODONE-ACETAMINOPHEN 10-325 MG PO TABS: 1.0000 | ORAL_TABLET | Freq: Four times a day (QID) | ORAL | 0 refills | Status: DC | PRN

## 2019-07-20 NOTE — Progress Notes (Signed)
Subjective:    Patient ID: George Villa, male    DOB: 08-May-1965, 54 y.o.   MRN: [id number]  HPI: George Villa is a 54 y.o. male who returns for follow up appointment for chronic pain and medication refill. He states his pain is located in his lower back. He rates his pain 7. His current exercise regime is walking and riding his stationary bicycle daily for 30 minutes he reports.   [name] Morphine equivalent is 60.00  MME.    Last UDS was Performed on 06/18/2019, it was consistent.   Pain Inventory Average Pain 7 Pain Right Now 7 My pain is dull  In the last 24 hours, has pain interfered with the following? General activity 7 Relation with others 7 Enjoyment of life 7 What TIME of day is your pain at its worst? night Sleep (in general) Fair  Pain is worse with: walking and standing Pain improves with: medication Relief from Meds: 9  Mobility use a cane ability to climb steps?  yes do you drive?  yes  Function disabled: date disabled .  Neuro/Psych weakness  Prior Studies Any changes since last visit?  no  Physicians involved in your care Any changes since last visit?  no   Family History  Problem Relation Age of Onset  . Heart Problems Father   . Hypertension Mother   . Stroke Other    Social History   Socioeconomic History  . Marital status: Single    Spouse name: Not on file  . Number of children: Not on file  . Years of education: Not on file  . Highest education level: Not on file  Occupational History  . Occupation: umemployed     Comment: Works in [employer]  . Smoking status: Never Smoker  . Smokeless tobacco: Never Used  Substance and Sexual Activity  . Alcohol use: Yes    Alcohol/week: 6.0 standard drinks    Types: 6 Cans of beer per week    Comment: OCCASSIONALLY   . Drug use: No  . Sexual activity: Yes  Other Topics Concern  . Not on file  Social History Narrative   7 cups of caffeine a week    Social  Determinants of Health   Financial Resource Strain:   . Difficulty of Paying Living Expenses:   Food Insecurity:   . Worried About Programme researcher, broadcasting/film/video in the Last Year:   . Barista in the Last Year:   Transportation Needs:   . Freight forwarder (Medical):   Marland Kitchen Lack of Transportation (Non-Medical):   Physical Activity:   . Days of Exercise per Week:   . Minutes of Exercise per Session:   Stress:   . Feeling of Stress :   Social Connections:   . Frequency of Communication with Friends and Family:   . Frequency of Social Gatherings with Friends and Family:   . Attends Religious Services:   . Active Member of Clubs or Organizations:   . Attends Banker Meetings:   Marland Kitchen Marital Status:    Past Surgical History:  Procedure Laterality Date  . BACK SURGERY    . BRAIN HEMATOMA EVACUATION  2006  . LEFT HEART CATHETERIZATION WITH CORONARY ANGIOGRAM N/A 02/17/2014   Procedure: LEFT HEART CATHETERIZATION WITH CORONARY ANGIOGRAM;  Surgeon: [name], MD;  Location[facility] C[facility] LAB;  Service: Cardiovascular;  Laterality: N/A;  . LUMBAR DISC SURGERY  2013  .  PERCUTANEOUS CORONARY STENT INTERVENTION (PCI-S)  02/17/2014   Procedure: PERCUTANEOUS CORONARY STENT INTERVENTION (PCI-S);  Surgeon: Sinclair Grooms, MD;  Location: [facility] CATH LAB;  Service: Cardiovascular;;  . TRACHEOSTOMY  05/2004  . VENTRICULOSTOMY  2006   Past Medical History:  Diagnosis Date  . Acid reflux   . 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  . Chronic lower back pain   . Headache    "monthly" (02/18/2014)  . Hypertension   . 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 Stroke [facility]) 2006   "left leg weak since" (02/18/2014)   BP (!) 145/99   Pulse 69   Temp 98.7 F (37.1 C)   Ht 5\' 7"  (1.702 m)   Wt 201 lb 3.2 oz (91.3 kg)   SpO2 96%   BMI 31.51 kg/m   Opioid Risk Score:   Fall Risk Score:  `1  Depression  screen PHQ 2/9  Depression screen [facility] 2/9 03/04/2019 01/05/2019 09/10/2018 08/05/2018 07/17/2018 08/28/2017 05/24/2017  Decreased Interest 0 0 0 0 0 0 0  Down, Depressed, Hopeless 0 0 0 0 0 0 0  PHQ - 2 Score 0 0 0 0 0 0 0    Review of Systems     Objective:   Physical Exam Vitals and nursing note reviewed.  Constitutional:      Appearance: Normal appearance.  Cardiovascular:     Rate and Rhythm: Normal rate and regular rhythm.     Pulses: Normal pulses.     Heart sounds: Normal heart sounds.  Pulmonary:     Effort: Pulmonary effort is normal.     Breath sounds: Normal breath sounds.  Musculoskeletal:     Cervical back: Normal range of motion and neck supple.     Comments: Normal Muscle Bulk and Muscle Testing Reveals:  Upper Extremities: Full ROM and Muscle Strength 5/5  Lumbar Paraspinal Tenderness: L-3-L-5 Lower Extremities: Full ROM and Muscle Strength 5/5 Arises from Chair slowly using cane for support Antalgic Wide Based Gait   Skin:    General: Skin is warm and dry.  Neurological:     Mental Status: He is alert and oriented to person, place, and time.  Psychiatric:        Mood and Affect: Mood normal.        Behavior: Behavior normal.           Assessment & Plan:  1. History of multiple strokes with gait disorder, ongoing ataxia andspasticleft hemiparesis: ContinueHEP as Tolerated.07/20/2019 2. Chronic low back pain with documented facet arthropathy and history of intradural defect/schwanomma requiring resection, per Dr. Naaman Plummer Note. Continue current medication regimen. Refilled:Percocet10/325 one q6 prn #120.07/20/2019 We will continue the opioid monitoring program, this consists of regular clinic visits, examinations, routine drug screening, pill counts as well as use of [place] Controlled Substance Reporting System. NCCSRS was reviewed.  F/U in 1 month  15 minutes of face to face patient care time was spent during this visit. All questions were  encouraged and answered.

## 2019-07-27 DIAGNOSIS — Z23 Encounter for immunization: Secondary | ICD-10-CM | POA: Diagnosis not present

## 2019-08-10 DIAGNOSIS — I639 Cerebral infarction, unspecified: Secondary | ICD-10-CM | POA: Diagnosis not present

## 2019-08-10 DIAGNOSIS — K219 Gastro-esophageal reflux disease without esophagitis: Secondary | ICD-10-CM | POA: Diagnosis not present

## 2019-08-10 DIAGNOSIS — D539 Nutritional anemia, unspecified: Secondary | ICD-10-CM | POA: Diagnosis not present

## 2019-08-10 DIAGNOSIS — E789 Disorder of lipoprotein metabolism, unspecified: Secondary | ICD-10-CM | POA: Diagnosis not present

## 2019-08-10 DIAGNOSIS — E559 Vitamin D deficiency, unspecified: Secondary | ICD-10-CM | POA: Diagnosis not present

## 2019-08-10 DIAGNOSIS — Z1159 Encounter for screening for other viral diseases: Secondary | ICD-10-CM | POA: Diagnosis not present

## 2019-08-10 DIAGNOSIS — I10 Essential (primary) hypertension: Secondary | ICD-10-CM | POA: Diagnosis not present

## 2019-08-12 ENCOUNTER — Encounter: Payer: Medicaid Other | Attending: Physical Medicine & Rehabilitation | Admitting: Registered Nurse

## 2019-08-12 ENCOUNTER — Encounter: Payer: Self-pay | Admitting: Registered Nurse

## 2019-08-12 ENCOUNTER — Other Ambulatory Visit: Payer: Self-pay

## 2019-08-12 VITALS — BP 132/88 | HR 87 | Temp 97.5°F | Ht 67.0 in | Wt 201.6 lb

## 2019-08-12 DIAGNOSIS — Z760 Encounter for issue of repeat prescription: Secondary | ICD-10-CM | POA: Insufficient documentation

## 2019-08-12 DIAGNOSIS — Z8673 Personal history of transient ischemic attack (TIA), and cerebral infarction without residual deficits: Secondary | ICD-10-CM | POA: Insufficient documentation

## 2019-08-12 DIAGNOSIS — Z5181 Encounter for therapeutic drug level monitoring: Secondary | ICD-10-CM | POA: Insufficient documentation

## 2019-08-12 DIAGNOSIS — R269 Unspecified abnormalities of gait and mobility: Secondary | ICD-10-CM | POA: Diagnosis not present

## 2019-08-12 DIAGNOSIS — I252 Old myocardial infarction: Secondary | ICD-10-CM | POA: Insufficient documentation

## 2019-08-12 DIAGNOSIS — Z955 Presence of coronary angioplasty implant and graft: Secondary | ICD-10-CM | POA: Diagnosis not present

## 2019-08-12 DIAGNOSIS — R262 Difficulty in walking, not elsewhere classified: Secondary | ICD-10-CM | POA: Insufficient documentation

## 2019-08-12 DIAGNOSIS — G894 Chronic pain syndrome: Secondary | ICD-10-CM | POA: Insufficient documentation

## 2019-08-12 DIAGNOSIS — M545 Low back pain: Secondary | ICD-10-CM | POA: Insufficient documentation

## 2019-08-12 DIAGNOSIS — M47816 Spondylosis without myelopathy or radiculopathy, lumbar region: Secondary | ICD-10-CM | POA: Insufficient documentation

## 2019-08-12 DIAGNOSIS — Z79891 Long term (current) use of opiate analgesic: Secondary | ICD-10-CM | POA: Diagnosis not present

## 2019-08-12 DIAGNOSIS — I10 Essential (primary) hypertension: Secondary | ICD-10-CM | POA: Diagnosis not present

## 2019-08-12 DIAGNOSIS — G8929 Other chronic pain: Secondary | ICD-10-CM | POA: Insufficient documentation

## 2019-08-12 MED ORDER — OXYCODONE-ACETAMINOPHEN 10-325 MG PO TABS: 1.0000 | ORAL_TABLET | Freq: Four times a day (QID) | ORAL | 0 refills | Status: DC | PRN

## 2019-08-12 NOTE — Progress Notes (Signed)
Subjective:    Patient ID: George Villa, male    DOB: [birth year], 54 y.o.   MRN: [id number]  HPI: George Villa is a 54 y.o. male who returns for follow up appointment for chronic pain and medication refill. He states his pain is located in his lower back. He  rates his pain 8. His current exercise regime is walking and riding his stationary bicycle for 30 minutes 5 days a week he reports.   George Villa Morphine equivalent is 60.00 MME.  Last UDS was Performed on 06/18/2019, it was consistent.   Pain Inventory Average Pain 8 Pain Right Now 8 My pain is dull  In the last 24 hours, has pain interfered with the following? General activity 7 Relation with others 7 Enjoyment of life 7 What TIME of day is your pain at its worst? night Sleep (in general) Fair  Pain is worse with: walking, bending and standing Pain improves with: medication Relief from Meds: 8  Mobility use a cane ability to climb steps?  yes do you drive?  yes  Function disabled: date disabled 2001  Neuro/Psych weakness  Prior Studies Any changes since last visit?  no  Physicians involved in your care Any changes since last visit?  no   Family History  Problem Relation Age of Onset  . Heart Problems Father   . Hypertension Mother   . Stroke Other    Social History   Socioeconomic History  . Marital status: Single    Spouse name: Not on file  . Number of children: Not on file  . Years of education: Not on file  . Highest education level: Not on file  Occupational History  . Occupation: umemployed     Comment: Works in [employer]  . Smoking status: Never Smoker  . Smokeless tobacco: Never Used  Substance and Sexual Activity  . Alcohol use: Yes    Alcohol/week: 6.0 standard drinks    Types: 6 Cans of beer per week    Comment: OCCASSIONALLY   . Drug use: No  . Sexual activity: Yes  Other Topics Concern  . Not on file  Social History Narrative   7 cups of caffeine a week      Social Determinants of Health   Financial Resource Strain:   . Difficulty of Paying Living Expenses:   Food Insecurity:   . Worried About Charity fundraiser in the Last Year:   . Arboriculturist in the Last Year:   Transportation Needs:   . Film/video editor (Medical):   Marland Kitchen Lack of Transportation (Non-Medical):   Physical Activity:   . Days of Exercise per Week:   . Minutes of Exercise per Session:   Stress:   . Feeling of Stress :   Social Connections:   . Frequency of Communication with Friends and Family:   . Frequency of Social Gatherings with Friends and Family:   . Attends Religious Services:   . Active Member of Clubs or Organizations:   . Attends Archivist Meetings:   Marland Kitchen Marital Status:    Past Surgical History:  Procedure Laterality Date  . BACK SURGERY    . BRAIN HEMATOMA EVACUATION  2006  . LEFT HEART CATHETERIZATION WITH CORONARY ANGIOGRAM N/A 02/17/2014   Procedure: LEFT HEART CATHETERIZATION WITH CORONARY ANGIOGRAM;  Surgeon: Sinclair Grooms, MD;  Location: [facility] CATH LAB;  Service: Cardiovascular;  Laterality: N/A;  . LUMBAR DISC SURGERY  2013  . PERCUTANEOUS CORONARY STENT INTERVENTION (PCI-S)  02/17/2014   Procedure: PERCUTANEOUS CORONARY STENT INTERVENTION (PCI-S);  Surgeon: [name], MD;  Location: [facility] CATH LAB;  Service: Cardiovascular;;  . TRACHEOSTOMY  05/2004  . VENTRICULOSTOMY  2006   Past Medical History:  Diagnosis Date  . Acid reflux   . 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  . Chronic lower back pain   . Headache    "monthly" (02/18/2014)  . Hypertension   . 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 Stroke [facility]) 2006   "left leg weak since" (02/18/2014)   BP 132/88   Pulse 87   Temp (!) 97.5 F (36.4 C)   Ht 5\' 7"  (1.702 m)   Wt 201 lb 9.6 oz (91.4 kg)   SpO2 95%   BMI 31.58 kg/m   Opioid Risk Score:   Fall Risk Score:   `1  Depression screen PHQ 2/9  Depression screen [facility] 2/9 08/12/2019 03/04/2019 01/05/2019 09/10/2018 08/05/2018 07/17/2018 08/28/2017  Decreased Interest 0 0 0 0 0 0 0  Down, Depressed, Hopeless 0 0 0 0 0 0 0  PHQ - 2 Score 0 0 0 0 0 0 0    Review of Systems  Constitutional: Negative.   HENT: Negative.   Eyes: Negative.   Respiratory: Negative.   Cardiovascular: Negative.   Gastrointestinal: Negative.   Endocrine: Negative.   Genitourinary: Positive for difficulty urinating.  Musculoskeletal: Negative.   Skin: Negative.   Allergic/Immunologic: Negative.   Neurological: Positive for weakness.  Hematological: Bruises/bleeds easily.       Plavix  Psychiatric/Behavioral: Negative.   All other systems reviewed and are negative.      Objective:   Physical Exam Vitals and nursing note reviewed.  Constitutional:      Appearance: Normal appearance.  Cardiovascular:     Rate and Rhythm: Normal rate and regular rhythm.     Pulses: Normal pulses.     Heart sounds: Normal heart sounds.  Pulmonary:     Effort: Pulmonary effort is normal.     Breath sounds: Normal breath sounds.  Musculoskeletal:     Cervical back: Normal range of motion.     Comments: Normal Muscle Bulk and Muscle Testing Reveals:  Upper Extremities: Full ROM and Muscle Strength 5/5 Lumbar Paraspinal Tenderness: L=4- L-5 Lower Extremities: Full ROM and Muscle Strength 5/5 Arises from Table slowly using cane for support Antalgic  Gait   Skin:    General: Skin is warm and dry.  Neurological:     Mental Status: He is alert and oriented to person, place, and time.  Psychiatric:        Mood and Affect: Mood normal.        Behavior: Behavior normal.           Assessment & Plan:  1. History of multiple strokes with gait disorder, ongoing ataxia andspasticleft hemiparesis: ContinueHEP as Tolerated.08/12/2019 2. Chronic low back pain with documented facet arthropathy and history of intradural defect/schwanomma  requiring resection, per Dr. 10/12/2019 Note. Continue current medication regimen. Refilled:Percocet10/325 one q6 prn #120.08/12/2019 We will continue the opioid monitoring program, this consists of regular clinic visits, examinations, routine drug screening, pill counts as well as use of 10/12/2019 Controlled Substance Reporting System. NCCSRS was reviewed.  F/U in 1 month  [place] of face to face patient care time was spent during this visit. All questions were encouraged and  answered.

## 2019-09-10 DIAGNOSIS — Z9229 Personal history of other drug therapy: Secondary | ICD-10-CM | POA: Diagnosis not present

## 2019-09-10 DIAGNOSIS — Z1211 Encounter for screening for malignant neoplasm of colon: Secondary | ICD-10-CM | POA: Diagnosis not present

## 2019-09-11 ENCOUNTER — Other Ambulatory Visit: Payer: Self-pay

## 2019-09-11 ENCOUNTER — Encounter: Payer: Medicaid Other | Attending: Physical Medicine & Rehabilitation | Admitting: Registered Nurse

## 2019-09-11 ENCOUNTER — Encounter: Payer: Self-pay | Admitting: Registered Nurse

## 2019-09-11 VITALS — BP 135/91 | HR 91 | Temp 98.7°F | Ht 67.0 in | Wt 198.6 lb

## 2019-09-11 DIAGNOSIS — G8929 Other chronic pain: Secondary | ICD-10-CM | POA: Diagnosis not present

## 2019-09-11 DIAGNOSIS — M545 Low back pain: Secondary | ICD-10-CM | POA: Diagnosis not present

## 2019-09-11 DIAGNOSIS — Z5181 Encounter for therapeutic drug level monitoring: Secondary | ICD-10-CM | POA: Diagnosis not present

## 2019-09-11 DIAGNOSIS — R262 Difficulty in walking, not elsewhere classified: Secondary | ICD-10-CM | POA: Insufficient documentation

## 2019-09-11 DIAGNOSIS — Z955 Presence of coronary angioplasty implant and graft: Secondary | ICD-10-CM | POA: Diagnosis not present

## 2019-09-11 DIAGNOSIS — Z8673 Personal history of transient ischemic attack (TIA), and cerebral infarction without residual deficits: Secondary | ICD-10-CM | POA: Diagnosis not present

## 2019-09-11 DIAGNOSIS — G894 Chronic pain syndrome: Secondary | ICD-10-CM

## 2019-09-11 DIAGNOSIS — R269 Unspecified abnormalities of gait and mobility: Secondary | ICD-10-CM

## 2019-09-11 DIAGNOSIS — Z79891 Long term (current) use of opiate analgesic: Secondary | ICD-10-CM

## 2019-09-11 DIAGNOSIS — I252 Old myocardial infarction: Secondary | ICD-10-CM | POA: Insufficient documentation

## 2019-09-11 DIAGNOSIS — M47816 Spondylosis without myelopathy or radiculopathy, lumbar region: Secondary | ICD-10-CM

## 2019-09-11 DIAGNOSIS — Z760 Encounter for issue of repeat prescription: Secondary | ICD-10-CM | POA: Diagnosis not present

## 2019-09-11 DIAGNOSIS — I10 Essential (primary) hypertension: Secondary | ICD-10-CM | POA: Insufficient documentation

## 2019-09-11 MED ORDER — OXYCODONE-ACETAMINOPHEN 10-325 MG PO TABS: 1.0000 | ORAL_TABLET | Freq: Four times a day (QID) | ORAL | 0 refills | Status: DC | PRN

## 2019-09-11 NOTE — Progress Notes (Signed)
Subjective:    Patient ID: George Villa, male    DOB: 03-05-1966, 54 y.o.   MRN: [id number]  HPI: George Villa is a 54 y.o. male who returns for follow up appointment for chronic pain and medication refill. He states his pain is located in his lower back. He rates his pain 7. His current exercise regime is walking, riding his stationary bicycle for 30 minutes and 5 days a week and outside chores.  [name] Morphine equivalent is 60.00  MME. UDS performed today.    Pain Inventory Average Pain 7 Pain Right Now 7 My pain is dull  In the last 24 hours, has pain interfered with the following? General activity 7 Relation with others 7 Enjoyment of life 7 What TIME of day is your pain at its worst? night Sleep (in general) Fair  Pain is worse with: walking and standing Pain improves with: medication Relief from Meds: 8  Mobility use a cane ability to climb steps?  yes  Function disabled: date disabled .  Neuro/Psych trouble walking  Prior Studies Any changes since last visit?  no  Physicians involved in your care Any changes since last visit?  no   Family History  Problem Relation Age of Onset  . Heart Problems Father   . Hypertension Mother   . Stroke Other    Social History   Socioeconomic History  . Marital status: Single    Spouse name: Not on file  . Number of children: Not on file  . Years of education: Not on file  . Highest education level: Not on file  Occupational History  . Occupation: umemployed     Comment: Works in [employer]  . Smoking status: Never Smoker  . Smokeless tobacco: Never Used  Substance and Sexual Activity  . Alcohol use: Yes    Alcohol/week: 6.0 standard drinks    Types: 6 Cans of beer per week    Comment: OCCASSIONALLY   . Drug use: No  . Sexual activity: Yes  Other Topics Concern  . Not on file  Social History Narrative   7 cups of caffeine a week    Social Determinants of Health   Financial  Resource Strain:   . Difficulty of Paying Living Expenses:   Food Insecurity:   . Worried About Charity fundraiser in the Last Year:   . Arboriculturist in the Last Year:   Transportation Needs:   . Film/video editor (Medical):   Marland Kitchen Lack of Transportation (Non-Medical):   Physical Activity:   . Days of Exercise per Week:   . Minutes of Exercise per Session:   Stress:   . Feeling of Stress :   Social Connections:   . Frequency of Communication with Friends and Family:   . Frequency of Social Gatherings with Friends and Family:   . Attends Religious Services:   . Active Member of Clubs or Organizations:   . Attends Archivist Meetings:   Marland Kitchen Marital Status:    Past Surgical History:  Procedure Laterality Date  . BACK SURGERY    . BRAIN HEMATOMA EVACUATION  2006  . LEFT HEART CATHETERIZATION WITH CORONARY ANGIOGRAM N/A 02/17/2014   Procedure: LEFT HEART CATHETERIZATION WITH CORONARY ANGIOGRAM;  Surgeon: Sinclair Grooms, MD;  Location: [facility] CATH LAB;  Service: Cardiovascular;  Laterality: N/A;  . LUMBAR [place] SURGERY  2013  . PERCUTANEOUS CORONARY STENT INTERVENTION (PCI-S)  02/17/2014  Procedure: PERCUTANEOUS CORONARY STENT INTERVENTION (PCI-S);  Surgeon: [name], MD;  Location: [facility] CATH LAB;  Service: Cardiovascular;;  . TRACHEOSTOMY  05/2004  . VENTRICULOSTOMY  2006   Past Medical History:  Diagnosis Date  . Acid reflux   . 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  . Chronic lower back pain   . Headache    "monthly" (02/18/2014)  . Hypertension   . 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 Stroke [facility]) 2006   "left leg weak since" (02/18/2014)   There were no vitals taken for this visit.  Opioid Risk Score:   Fall Risk Score:  `1  Depression screen PHQ 2/9  Depression screen [facility] 2/9 09/11/2019 08/12/2019 03/04/2019 01/05/2019 09/10/2018 08/05/2018 07/17/2018  Decreased Interest  0 0 0 0 0 0 0  Down, Depressed, Hopeless 0 0 0 0 0 0 0  PHQ - 2 Score 0 0 0 0 0 0 0     Review of Systems  Musculoskeletal: Positive for gait problem.  All other systems reviewed and are negative.      Objective:   Physical Exam Vitals and nursing note reviewed.  Constitutional:      Appearance: Normal appearance.  Cardiovascular:     Rate and Rhythm: Normal rate and regular rhythm.     Pulses: Normal pulses.     Heart sounds: Normal heart sounds.  Pulmonary:     Effort: Pulmonary effort is normal.     Breath sounds: Normal breath sounds.  Musculoskeletal:     Cervical back: Normal range of motion and neck supple.     Comments: Normal Muscle Bulk and Muscle Testing Reveals:  Upper Extremities: Full ROM and Muscle Strength 5/5  Lumbar Paraspinal Tenderness: L-4-L-5 Lower Extremities: Full ROM and Muscle Strength 5/5 Arises from chair slowly using cane Wide Based  Gait   Skin:    General: Skin is warm and dry.  Neurological:     Mental Status: He is alert and oriented to person, place, and time.  Psychiatric:        Mood and Affect: Mood normal.        Behavior: Behavior normal.           Assessment & Plan:  1. History of multiple strokes with gait disorder, ongoing ataxia andspasticleft hemiparesis: ContinueHEP as Tolerated.09/11/2019 2. Chronic low back pain with documented facet arthropathy and history of intradural defect/schwanomma requiring resection, per Dr. Riley Kill Note. Continue current medication regimen. Refilled:Percocet10/325 one q6 prn #120.09/11/2019 We will continue the opioid monitoring program, this consists of regular clinic visits, examinations, routine drug screening, pill counts as well as use of [place] Controlled Substance Reporting System. NCCSRS was reviewed.  F/U in 1 month  of face to face patient care time was spent during this visit. All questions were encouraged and answered.

## 2019-09-17 LAB — TOXASSURE SELECT,+ANTIDEPR,UR

## 2019-09-23 ENCOUNTER — Telehealth: Payer: Self-pay | Admitting: *Deleted

## 2019-09-23 NOTE — Telephone Encounter (Signed)
Urine drug screen is positive for prescribed medication, however it is positive for alcohol.  This is his first offense, so a formal warning letter will be mailed.

## 2019-09-28 ENCOUNTER — Encounter (HOSPITAL_COMMUNITY): Payer: Self-pay | Admitting: Emergency Medicine

## 2019-09-28 ENCOUNTER — Observation Stay (HOSPITAL_COMMUNITY): Payer: Medicaid Other

## 2019-09-28 ENCOUNTER — Other Ambulatory Visit: Payer: Self-pay

## 2019-09-28 ENCOUNTER — Observation Stay (HOSPITAL_COMMUNITY)
Admission: EM | Admit: 2019-09-28 | Discharge: 2019-09-29 | Disposition: A | Discharge status: Still In Hospital | Payer: Medicaid Other | Attending: Emergency Medicine | Admitting: Emergency Medicine

## 2019-09-28 ENCOUNTER — Emergency Department (HOSPITAL_COMMUNITY): Payer: Medicaid Other

## 2019-09-28 DIAGNOSIS — I2510 Atherosclerotic heart disease of native coronary artery without angina pectoris: Secondary | ICD-10-CM | POA: Diagnosis not present

## 2019-09-28 DIAGNOSIS — R2689 Other abnormalities of gait and mobility: Secondary | ICD-10-CM | POA: Diagnosis not present

## 2019-09-28 DIAGNOSIS — Z955 Presence of coronary angioplasty implant and graft: Secondary | ICD-10-CM | POA: Diagnosis not present

## 2019-09-28 DIAGNOSIS — M549 Dorsalgia, unspecified: Secondary | ICD-10-CM | POA: Diagnosis not present

## 2019-09-28 DIAGNOSIS — Z7902 Long term (current) use of antithrombotics/antiplatelets: Secondary | ICD-10-CM | POA: Diagnosis not present

## 2019-09-28 DIAGNOSIS — Z79899 Other long term (current) drug therapy: Secondary | ICD-10-CM | POA: Diagnosis not present

## 2019-09-28 DIAGNOSIS — M5441 Lumbago with sciatica, right side: Secondary | ICD-10-CM

## 2019-09-28 DIAGNOSIS — N390 Urinary tract infection, site not specified: Secondary | ICD-10-CM | POA: Diagnosis not present

## 2019-09-28 DIAGNOSIS — M5442 Lumbago with sciatica, left side: Secondary | ICD-10-CM | POA: Diagnosis not present

## 2019-09-28 DIAGNOSIS — I252 Old myocardial infarction: Secondary | ICD-10-CM | POA: Insufficient documentation

## 2019-09-28 DIAGNOSIS — I69298 Other sequelae of other nontraumatic intracranial hemorrhage: Secondary | ICD-10-CM | POA: Insufficient documentation

## 2019-09-28 DIAGNOSIS — G8929 Other chronic pain: Secondary | ICD-10-CM | POA: Diagnosis not present

## 2019-09-28 DIAGNOSIS — K219 Gastro-esophageal reflux disease without esophagitis: Secondary | ICD-10-CM | POA: Insufficient documentation

## 2019-09-28 DIAGNOSIS — R519 Headache, unspecified: Secondary | ICD-10-CM

## 2019-09-28 DIAGNOSIS — I639 Cerebral infarction, unspecified: Secondary | ICD-10-CM | POA: Diagnosis present

## 2019-09-28 DIAGNOSIS — N3000 Acute cystitis without hematuria: Secondary | ICD-10-CM | POA: Diagnosis not present

## 2019-09-28 DIAGNOSIS — R509 Fever, unspecified: Secondary | ICD-10-CM | POA: Diagnosis not present

## 2019-09-28 DIAGNOSIS — R0600 Dyspnea, unspecified: Secondary | ICD-10-CM

## 2019-09-28 DIAGNOSIS — I10 Essential (primary) hypertension: Secondary | ICD-10-CM | POA: Insufficient documentation

## 2019-09-28 DIAGNOSIS — G894 Chronic pain syndrome: Secondary | ICD-10-CM | POA: Insufficient documentation

## 2019-09-28 DIAGNOSIS — Z20822 Contact with and (suspected) exposure to covid-19: Secondary | ICD-10-CM | POA: Diagnosis not present

## 2019-09-28 DIAGNOSIS — J90 Pleural effusion, not elsewhere classified: Secondary | ICD-10-CM | POA: Diagnosis not present

## 2019-09-28 DIAGNOSIS — R000 Tachycardia, unspecified: Secondary | ICD-10-CM | POA: Diagnosis not present

## 2019-09-28 DIAGNOSIS — K449 Diaphragmatic hernia without obstruction or gangrene: Secondary | ICD-10-CM | POA: Diagnosis not present

## 2019-09-28 LAB — COMPREHENSIVE METABOLIC PANEL
ALT: 28 U/L (ref 0–44)
AST: 22 U/L (ref 15–41)
Albumin: 3.7 g/dL (ref 3.5–5.0)
Alkaline Phosphatase: 70 U/L (ref 38–126)
Anion gap: 14 (ref 5–15)
BUN: 22 mg/dL — ABNORMAL HIGH (ref 6–20)
CO2: 17 mmol/L — ABNORMAL LOW (ref 22–32)
Calcium: 9 mg/dL (ref 8.9–10.3)
Chloride: 108 mmol/L (ref 98–111)
Creatinine, Ser: 1.38 mg/dL — ABNORMAL HIGH (ref 0.61–1.24)
GFR calc Af Amer: >60 mL/min (ref >60)
GFR calc non Af Amer: 57 mL/min — ABNORMAL LOW (ref >60)
Glucose, Bld: 105 mg/dL — ABNORMAL HIGH (ref 70–99)
Potassium: 3.1 mmol/L — ABNORMAL LOW (ref 3.5–5.1)
Sodium: 139 mmol/L (ref 135–145)
Total Bilirubin: 1.1 mg/dL (ref 0.3–1.2)
Total Protein: 7.4 g/dL (ref 6.5–8.1)

## 2019-09-28 LAB — CBC WITH DIFFERENTIAL/PLATELET
Abs Immature Granulocytes: 0.12 10*3/uL — ABNORMAL HIGH (ref 0.00–0.07)
Basophils Absolute: 0.1 10*3/uL (ref 0.0–0.1)
Basophils Relative: 0 %
Eosinophils Absolute: 0 10*3/uL (ref 0.0–0.5)
Eosinophils Relative: 0 %
HCT: 48.4 % (ref 39.0–52.0)
Hemoglobin: 16 g/dL (ref 13.0–17.0)
Immature Granulocytes: 1 %
Lymphocytes Relative: 1 %
Lymphs Abs: 0.3 10*3/uL — ABNORMAL LOW (ref 0.7–4.0)
MCH: 28 pg (ref 26.0–34.0)
MCHC: 33.1 g/dL (ref 30.0–36.0)
MCV: 84.8 fL (ref 80.0–100.0)
Monocytes Absolute: 0.4 10*3/uL (ref 0.1–1.0)
Monocytes Relative: 2 %
Neutro Abs: 17.3 10*3/uL — ABNORMAL HIGH (ref 1.7–7.7)
Neutrophils Relative %: 96 %
Platelets: 256 10*3/uL (ref 150–400)
RBC: 5.71 MIL/uL (ref 4.22–5.81)
RDW: 13.6 % (ref 11.5–15.5)
WBC: 18.1 10*3/uL — ABNORMAL HIGH (ref 4.0–10.5)
nRBC: 0 % (ref 0.0–0.2)

## 2019-09-28 LAB — URINALYSIS, ROUTINE W REFLEX MICROSCOPIC
Bacteria, UA: NONE SEEN
Glucose, UA: NEGATIVE mg/dL
Hgb urine dipstick: NEGATIVE
Ketones, ur: 20 mg/dL — AB
Nitrite: NEGATIVE
Protein, ur: 30 mg/dL — AB
Specific Gravity, Urine: 1.04 — ABNORMAL HIGH (ref 1.005–1.030)
pH: 5 (ref 5.0–8.0)

## 2019-09-28 LAB — SARS CORONAVIRUS 2 BY RT PCR (HOSPITAL ORDER, PERFORMED IN ~~LOC~~ HOSPITAL LAB): SARS Coronavirus 2: NEGATIVE

## 2019-09-28 LAB — LACTIC ACID, PLASMA
Lactic Acid, Venous: 1.3 mmol/L (ref 0.5–1.9)
Lactic Acid, Venous: 1.4 mmol/L (ref 0.5–1.9)

## 2019-09-28 MED ORDER — VANCOMYCIN HCL IN DEXTROSE 1-5 GM/200ML-% IV SOLN
1000.0000 mg | Freq: Once | INTRAVENOUS | Status: AC
  Administered 2019-09-28: 1000 mg via INTRAVENOUS
  Filled 2019-09-28: qty 200

## 2019-09-28 MED ORDER — SODIUM CHLORIDE 0.9 % IV BOLUS
1000.0000 mL | Freq: Once | INTRAVENOUS | Status: AC
  Administered 2019-09-28: 1000 mL via INTRAVENOUS

## 2019-09-28 MED ORDER — SODIUM CHLORIDE 0.9% FLUSH
3.0000 mL | Freq: Two times a day (BID) | INTRAVENOUS | Status: DC
  Administered 2019-09-28 – 2019-09-29 (×2): 3 mL via INTRAVENOUS

## 2019-09-28 MED ORDER — PANTOPRAZOLE SODIUM 40 MG PO TBEC: 40.0000 mg | DELAYED_RELEASE_TABLET | Freq: Every day | ORAL | Status: DC

## 2019-09-28 MED ORDER — ALUM & MAG HYDROXIDE-SIMETH 200-200-20 MG/5ML PO SUSP
30.0000 mL | ORAL | Status: DC | PRN
  Administered 2019-09-28: 30 mL via ORAL
  Filled 2019-09-28: qty 30

## 2019-09-28 MED ORDER — CLOPIDOGREL BISULFATE 75 MG PO TABS
75.0000 mg | ORAL_TABLET | Freq: Every day | ORAL | Status: DC
  Administered 2019-09-28 – 2019-09-29 (×2): 75 mg via ORAL
  Filled 2019-09-28 (×2): qty 1

## 2019-09-28 MED ORDER — SODIUM CHLORIDE 0.9 % IV SOLN: 250.0000 mL | INTRAVENOUS | Status: DC | PRN

## 2019-09-28 MED ORDER — POTASSIUM CHLORIDE CRYS ER 20 MEQ PO TBCR
40.0000 meq | EXTENDED_RELEASE_TABLET | Freq: Once | ORAL | Status: AC
  Administered 2019-09-28: 40 meq via ORAL
  Filled 2019-09-28: qty 2

## 2019-09-28 MED ORDER — DEXTROSE 5 % IV SOLN
750.0000 mg | Freq: Three times a day (TID) | INTRAVENOUS | Status: DC
  Administered 2019-09-28: 750 mg via INTRAVENOUS
  Filled 2019-09-28 (×4): qty 15

## 2019-09-28 MED ORDER — POLYETHYLENE GLYCOL 3350 17 G PO PACK: 17.0000 g | PACK | Freq: Every day | ORAL | Status: DC | PRN

## 2019-09-28 MED ORDER — PANTOPRAZOLE SODIUM 40 MG PO TBEC
40.0000 mg | DELAYED_RELEASE_TABLET | Freq: Every day | ORAL | Status: DC
  Administered 2019-09-28 – 2019-09-29 (×2): 40 mg via ORAL
  Filled 2019-09-28 (×3): qty 1

## 2019-09-28 MED ORDER — METOPROLOL TARTRATE 25 MG PO TABS
25.0000 mg | ORAL_TABLET | Freq: Two times a day (BID) | ORAL | Status: DC
  Administered 2019-09-28 – 2019-09-29 (×2): 25 mg via ORAL
  Filled 2019-09-28 (×2): qty 1

## 2019-09-28 MED ORDER — OXYCODONE HCL 5 MG PO TABS
10.0000 mg | ORAL_TABLET | Freq: Four times a day (QID) | ORAL | Status: DC | PRN
  Administered 2019-09-28 – 2019-09-29 (×2): 10 mg via ORAL
  Filled 2019-09-28 (×2): qty 2

## 2019-09-28 MED ORDER — DEXAMETHASONE SODIUM PHOSPHATE 10 MG/ML IJ SOLN
10.0000 mg | Freq: Once | INTRAMUSCULAR | Status: AC
  Administered 2019-09-28: 10 mg via INTRAVENOUS
  Filled 2019-09-28: qty 1

## 2019-09-28 MED ORDER — HEPARIN SODIUM (PORCINE) 5000 UNIT/ML IJ SOLN
5000.0000 [IU] | Freq: Three times a day (TID) | INTRAMUSCULAR | Status: DC
  Administered 2019-09-28 – 2019-09-29 (×2): 5000 [IU] via SUBCUTANEOUS
  Filled 2019-09-28 (×2): qty 1

## 2019-09-28 MED ORDER — SODIUM CHLORIDE 0.9% FLUSH: 3.0000 mL | INTRAVENOUS | Status: DC | PRN

## 2019-09-28 MED ORDER — OXYCODONE-ACETAMINOPHEN 10-325 MG PO TABS: 1.0000 | ORAL_TABLET | Freq: Three times a day (TID) | ORAL | Status: DC | PRN

## 2019-09-28 MED ORDER — LABETALOL HCL 5 MG/ML IV SOLN: 10.0000 mg | INTRAVENOUS | Status: DC | PRN

## 2019-09-28 MED ORDER — NITROGLYCERIN 0.4 MG SL SUBL: 0.4000 mg | SUBLINGUAL_TABLET | SUBLINGUAL | Status: DC | PRN

## 2019-09-28 MED ORDER — SODIUM CHLORIDE 0.9 % IV SOLN
2.0000 g | INTRAVENOUS | Status: DC
  Administered 2019-09-29: 2 g via INTRAVENOUS
  Filled 2019-09-28: qty 20

## 2019-09-28 MED ORDER — SODIUM CHLORIDE 0.9 % IV SOLN: INTRAVENOUS | Status: DC

## 2019-09-28 MED ORDER — FERROUS SULFATE 325 (65 FE) MG PO TABS
325.0000 mg | ORAL_TABLET | Freq: Every day | ORAL | Status: DC
  Administered 2019-09-29: 325 mg via ORAL
  Filled 2019-09-28: qty 1

## 2019-09-28 MED ORDER — ACETAMINOPHEN 500 MG PO TABS
1000.0000 mg | ORAL_TABLET | Freq: Once | ORAL | Status: AC
  Administered 2019-09-28: 1000 mg via ORAL
  Filled 2019-09-28: qty 2

## 2019-09-28 MED ORDER — ATORVASTATIN CALCIUM 20 MG PO TABS
20.0000 mg | ORAL_TABLET | Freq: Every day | ORAL | Status: DC
  Administered 2019-09-28 – 2019-09-29 (×2): 20 mg via ORAL
  Filled 2019-09-28 (×2): qty 1

## 2019-09-28 MED ORDER — LISINOPRIL 10 MG PO TABS
40.0000 mg | ORAL_TABLET | Freq: Every day | ORAL | Status: DC
  Administered 2019-09-28 – 2019-09-29 (×2): 40 mg via ORAL
  Filled 2019-09-28 (×2): qty 4

## 2019-09-28 MED ORDER — SODIUM CHLORIDE 0.9 % IV SOLN
2.0000 g | Freq: Once | INTRAVENOUS | Status: AC
  Administered 2019-09-28: 2 g via INTRAVENOUS
  Filled 2019-09-28: qty 20

## 2019-09-28 MED ORDER — TRAZODONE HCL 50 MG PO TABS
100.0000 mg | ORAL_TABLET | Freq: Every day | ORAL | Status: DC
  Administered 2019-09-28: 100 mg via ORAL
  Filled 2019-09-28: qty 2

## 2019-09-28 MED ORDER — AMLODIPINE BESYLATE 5 MG PO TABS
10.0000 mg | ORAL_TABLET | Freq: Every day | ORAL | Status: DC
  Administered 2019-09-28 – 2019-09-29 (×2): 10 mg via ORAL
  Filled 2019-09-28 (×2): qty 2

## 2019-09-28 NOTE — ED Provider Notes (Addendum)
[facility] Provider Note   CSN: [id number] Arrival date & time: 09/28/19  0532   Time seen 5:43 AM  History Chief Complaint  Patient presents with  . Headache    George Villa is a 54 y.o. male.  HPI   Patient states he felt fine all day on January 20.  At 2:30 AM he woke up with a diffuse dull headache he had nausea and states he vomited about 4 times without blood.  He states he has had this headache before the last time was a few weeks ago.  He states he actually gets headaches every few weeks for years.  However he is concerned because the last time he had a headache with vomiting he had a hemorrhagic stroke.  He denies visual problems, balance problems, new numbness or tingling of his extremities, sore throat, rhinorrhea, cough, diarrhea, chest pain, shortness of breath, neck pain, abdominal pain, or any other specific symptom other than his headache.  He denies any light sensitivity but states sometimes noises make his headaches worse.  He states he took 4 Goody's this morning without relief of his headache which usually helps.  He denies any tick exposure.  He denies being around anybody else who is sick.  Patient was unaware he was having a fever, he denies any chills.  Patient states he did take the [employer] vaccine.  PCP  George Villa at [facility]  Past Medical History:  Diagnosis Date  . Acid reflux   . Cerebellar hemorrhage ([place])    a. 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  . Chronic lower back pain   . Headache    "monthly" (02/18/2014)  . Hypertension   . Inferior MI ([place]) 02/17/2014   George Villa 02/17/2014  . Stroke [facility]) 2006   "left leg weak since" (02/18/2014)    Patient Active Problem List   Diagnosis Date Noted  . Chronic pain syndrome 12/23/2017  . Spondylosis without myelopathy or radiculopathy, lumbar region 02/05/2017  . Chronic low back pain 12/05/2016  . Abnormality of gait  12/26/2015  . Spastic hemiparesis of left nondominant side (George Villa) 12/26/2015  . Chronic back pain 09/28/2015  . History of stroke   . Hemianopia, homonymous, right   . Stroke ([place]) 09/27/2015  . Acute CVA (cerebrovascular accident) ([place]) 09/27/2015  . Hyperlipidemia 07/16/2014  . Iron deficiency anemia 07/16/2014  . Prolonged Q-T interval on ECG 07/16/2014  . CVA (cerebral infarction) 07/16/2014  . Acute ischemic left PCA stroke ([place]) 07/15/2014  . CAD S/P PCI of dRCA - 2 DES (2.25 x 28 Promus Premier & 2.75 x 28 Promus Premier -- postdilated to 2.75 mm) - distal RCA branches small & diffusely diseased. 02/18/2014    Class: Status post  . History of ST elevation myocardial infarction (STEMI) 02/17/2014  . Essential hypertension 02/17/2014  . History of cerebellar hemorrhage 2006 02/17/2014  . GERD (gastroesophageal reflux disease) 02/17/2014  . Hyperglycemia 02/17/2014  . Obesity 02/17/2014  . ST elevation myocardial infarction (STEMI) involving right coronary artery in recovery phase ([place]) 02/17/2014    Past Surgical History:  Procedure Laterality Date  . BACK SURGERY    . BRAIN HEMATOMA EVACUATION  2006  . LEFT HEART CATHETERIZATION WITH CORONARY ANGIOGRAM N/A 02/17/2014   Procedure: LEFT HEART CATHETERIZATION WITH CORONARY ANGIOGRAM;  Surgeon: George Grooms, MD;  Location: [facility] CATH LAB;  Service: Cardiovascular;  Laterality: N/A;  . LUMBAR [place] SURGERY  2013  . PERCUTANEOUS CORONARY STENT  INTERVENTION (PCI-S)  02/17/2014   Procedure: PERCUTANEOUS CORONARY STENT INTERVENTION (PCI-S);  Surgeon: George NoeHenry W Rodrigues III, MD;  Location[facility] CATH LAB;  Service: Cardiovascular;;  . TRACHEOSTOMY  05/2004  . VENTRICULOSTOMY  2006       Family History  Problem Relation Age of Onset  . Heart Problems Father   . Hypertension Mother   . Stroke Other     Social History   Tobacco Use  . Smoking status: Never Smoker  . Smokeless tobacco: Never Used  Substance Use Topics  . Alcohol  use: Yes    Alcohol/week: 6.0 standard drinks    Types: 6 Cans of beer per week    Comment: OCCASSIONALLY   . Drug use: No    Home Medications Prior to Admission medications   Medication Sig Start Date End Date Taking? Authorizing Provider  amLODipine (NORVASC) 10 MG tablet Take 10 mg by mouth daily. 12/14/16   [provider]  atorvastatin (LIPITOR) 20 MG tablet Take 20 mg by mouth daily. 12/19/16   [provider]  clopidogrel (PLAVIX) 75 MG tablet Take 1 tablet (75 mg total) by mouth daily. 09/28/15   George Villa, David, MD  ferrous sulfate 325 (65 FE) MG EC tablet Take 325 mg by mouth 2 (two) times daily.    [provider]  ipratropium (ATROVENT) 0.06 % nasal spray Place 2 sprays into both nostrils 4 (four) times daily.    [provider]  lisinopril (PRINIVIL,ZESTRIL) 20 MG tablet Take 20 mg by mouth daily. 09/19/15   [provider]  nitroGLYCERIN (NITROSTAT) 0.4 MG SL tablet Place 1 tablet (0.4 mg total) under the tongue every 5 (five) minutes as needed for chest pain. 02/19/14   George Villa, George M, PA-C  oxyCODONE-acetaminophen (PERCOCET) 10-325 MG tablet Take 1 tablet by mouth every 6 (six) hours as needed for pain. 09/11/19   George Villa, George L, NP  pantoprazole (PROTONIX) 40 MG tablet Take 40 mg by mouth daily. 01/11/17   [provider]    Allergies    Patient has no known allergies.  Review of Systems   Review of Systems  All other systems reviewed and are negative.   Physical Exam Updated Vital Signs BP 104/71   Pulse (!) 130   Temp (!) 101.1 F (38.4 C) (Rectal)   Resp (!) 22   Ht 5\' 7"  (1.702 Villa)   Wt 90.1 kg   SpO2 95%   BMI 31.11 kg/Villa   Vital signs normal except for fever and tachycardia   Physical Exam Vitals and nursing note reviewed.  Constitutional:      General: He is not in acute distress.    Appearance: Normal appearance. He is normal weight.  HENT:     Head: Normocephalic and atraumatic.     Right Ear:  External ear normal.     Left Ear: External ear normal.     Nose: Nose normal.     Mouth/Throat:     Mouth: Mucous membranes are dry.  Eyes:     Extraocular Movements: Extraocular movements intact.     Conjunctiva/sclera: Conjunctivae normal.     Pupils: Pupils are equal, round, and reactive to light.  Cardiovascular:     Rate and Rhythm: Regular rhythm. Tachycardia present.     Pulses: Normal pulses.     Heart sounds: Normal heart sounds. No murmur heard.   Pulmonary:     Effort: No respiratory distress.     Breath sounds: Normal breath sounds.  Musculoskeletal:  General: Normal range of motion.     Cervical back: Normal range of motion and neck supple.  Skin:    General: Skin is warm and dry.  Neurological:     General: No focal deficit present.     Mental Status: He is alert and oriented to person, place, and time.     Cranial Nerves: No cranial nerve deficit.  Psychiatric:        Mood and Affect: Mood normal.        Behavior: Behavior normal.        Thought Content: Thought content normal.     ED Results / Procedures / Treatments   Labs (all labs ordered are listed, but only abnormal results are displayed) Results for orders placed or performed during the hospital encounter of 09/28/19  Culture, blood (routine x 2)   Specimen: BLOOD  Result Value Ref Range   Specimen Description BLOOD LEFT ANTECUBITAL    Special Requests      BOTTLES DRAWN AEROBIC AND ANAEROBIC Blood Culture adequate volume Performed at [facility], [address], [place], [place] [address]    Culture PENDING    Report Status PENDING   Culture, blood (routine x 2)   Specimen: BLOOD  Result Value Ref Range   Specimen Description BLOOD RIGHT ANTECUBITAL    Special Requests      BOTTLES DRAWN AEROBIC AND ANAEROBIC Blood Culture adequate volume Performed at [facility], [address] [place], [place] [address]    Culture PENDING    Report Status PENDING   Comprehensive metabolic panel   Result Value Ref Range   Sodium 139 135 - 145 mmol/L   Potassium 3.1 (L) 3.5 - 5.1 mmol/L   Chloride 108 98 - 111 mmol/L   CO2 17 (L) 22 - 32 mmol/L   Glucose, Bld 105 (H) 70 - 99 mg/dL   BUN 22 (H) 6 - 20 mg/dL   Creatinine, Ser 2.75 (H) 0.61 - 1.24 mg/dL   Calcium 9.0 8.9 - 17.0 mg/dL   Total Protein 7.4 6.5 - 8.1 g/dL   Albumin 3.7 3.5 - 5.0 g/dL   AST 22 15 - 41 U/L   ALT 28 0 - 44 U/L   Alkaline Phosphatase 70 38 - 126 U/L   Total Bilirubin 1.1 0.3 - 1.2 mg/dL   GFR calc non Af Amer 57 (L) >60 mL/min   GFR calc Af Amer >60 >60 mL/min   Anion gap 14 5 - 15  CBC with Differential  Result Value Ref Range   WBC 18.1 (H) 4.0 - 10.5 K/uL   RBC 5.71 4.22 - 5.81 MIL/uL   Hemoglobin 16.0 13.0 - 17.0 g/dL   HCT 01.7 39 - 52 %   MCV 84.8 80.0 - 100.0 fL   MCH 28.0 26.0 - 34.0 pg   MCHC 33.1 30.0 - 36.0 g/dL   RDW 49.4 49.6 - 75.9 %   Platelets 256 150 - 400 K/uL   nRBC 0.0 0.0 - 0.2 %   Neutrophils Relative % 96 %   Neutro Abs 17.3 (H) 1.7 - 7.7 K/uL   Lymphocytes Relative 1 %   Lymphs Abs 0.3 (L) 0.7 - 4.0 K/uL   Monocytes Relative 2 %   Monocytes Absolute 0.4 0 - 1 K/uL   Eosinophils Relative 0 %   Eosinophils Absolute 0.0 0 - 0 K/uL   Basophils Relative 0 %   Basophils Absolute 0.1 0 - 0 K/uL   Immature Granulocytes 1 %  Abs Immature Granulocytes 0.12 (H) 0.00 - 0.07 K/uL  Lactic acid, plasma  Result Value Ref Range   Lactic Acid, Venous 1.3 0.5 - 1.9 mmol/L   Laboratory interpretation all normal except leukocytosis, new renal insufficiency, hypokalemia, low bicarb however anion gap is normal as is his lactic acid.   EKG EKG Interpretation  Date/Time:  Monday September 28 2019 05:41:01 EDT Ventricular Rate:  138 PR Interval:    QRS Duration: 101 QT Interval:  292 QTC Calculation: 443 R Axis:   -56 Text Interpretation: Sinus tachycardia Left anterior fascicular block Borderline low voltage, extremity leads Abnormal R-wave progression, late transition Since last  tracing rate faster 27 Sep 2015 Confirmed by Devoria Albe ([id number]) on 09/28/2019 7:37:37 AM   Radiology CT Head Wo Contrast  Result Date: 09/28/2019 CLINICAL DATA:  Headache EXAM: CT HEAD WITHOUT CONTRAST TECHNIQUE: Contiguous axial images were obtained from the base of the skull through the vertex without intravenous contrast. COMPARISON:  September 28, 2015 FINDINGS: Brain: No evidence of acute territorial infarction, hemorrhage, hydrocephalus,extra-axial collection or mass lesion/mass effect. Stable areas of encephalomalacia involving the left occipital lobe and right cerebellum. There is ex vacuo dilatation of the fourth ventricle. Prior lacunar infarct seen within the right basal ganglia. There is dilatation the ventricles and sulci consistent with age-related atrophy. Low-attenuation changes in the deep white matter consistent with small vessel ischemia. Vascular: No hyperdense vessel or unexpected calcification. Skull: There is a craniectomy defect overlying the cerebellum. No fracture or focal lesion identified. Sinuses/Orbits: The visualized paranasal sinuses and mastoid air cells are clear. The orbits and globes intact. Other: None IMPRESSION: No acute intracranial abnormality. Prior areas of encephalomalacia involving the cerebellum and left occipital lobe. Findings consistent with age related atrophy and chronic small vessel ischemia Electronically Signed   By: Jonna Clark Villa.D.   On: 09/28/2019 06:49    Procedures Procedures (including critical care time)  Medications Ordered in ED Medications  sodium chloride 0.9 % bolus 1,000 mL (has no administration in time range)  sodium chloride 0.9 % bolus 1,000 mL (has no administration in time range)  acetaminophen (TYLENOL) tablet 1,000 mg (1,000 mg Oral Given 09/28/19 7262)    ED Course  I have reviewed the triage vital signs and the nursing notes.  Pertinent labs & imaging results that were available during my care of the patient were reviewed by  me and considered in my medical decision making (see chart for details).    MDM Rules/Calculators/A&P                          Patient's oral temp was low-grade, due to his tachycardia rectal temp was done and he did have a fever of 101.1.  Cultures were obtained and lab work.  Head CT was done due to his previous history of cerebellar bleed with similar symptoms.  IV fluids were held until the CT was done.  Recheck at 7:20 AM I discussed his test results that are back so far including leukocytosis, normal head CT.  Patient has not given urine sample yet.  I ordered IV fluids and talk to the patient about the need of getting a urinalysis.  He denies any prior tick exposure.  7:30 AM patient was turned over to Dr. Jacqulyn Bath at change of shift to get results of his urinalysis and determine what further treatment he needs.  Final Clinical Impression(s) / ED Diagnoses Final diagnoses:  Acute nonintractable headache, unspecified headache type  Fever, unspecified fever cause    Rx / DC Orders   Disposition pending  Devoria Albe, MD, Concha Pyo, MD 09/28/19 [id number]    Devoria Albe, MD 09/28/19 [phone]

## 2019-09-28 NOTE — ED Triage Notes (Signed)
Pt C/o headache and emesis that started at 0330 tonight. Pt has prior weakness is left leg due to previous stroke.

## 2019-09-28 NOTE — Plan of Care (Signed)

## 2019-09-28 NOTE — ED Notes (Signed)
Lab at bedside to obtain lactic acid am sample.

## 2019-09-28 NOTE — ED Provider Notes (Signed)
Blood pressure 104/71, pulse (!) 130, temperature (!) 101.1 F (38.4 C), temperature source Rectal, resp. rate (!) 22, height 5\' 7"  (1.702 m), weight 90.1 kg, SpO2 95 %.  Assuming care from Dr. .  In short, George Villa is a 54 y.o. male with a chief complaint of Headache .  Refer to the original H&P for additional details.  The current plan of care is to f/u on labs and reassess after IVF. HA is similar to prior. No clear source of fever at this time.   UA is unremarkable.  Although my suspicion for bacterial meningitis is very low I do not have an alternate source for the patient's fever.  Tachycardia has improved with fever reduction.  He does not appear septic.  He is on Plavix with normal platelet count.  He has history of degenerative disc disease and lower spine surgery.  In this situation the risk of traumatic tap and possible bleeding complication being on Plavix is elevated.  I discussed this with the patient and have decided to start the patient on antibiotics and steroid empirically.  Could consider LP under fluoroscopy versus awaiting blood cultures and clinical improvement. Plan for admit. COVID sent but patient is fully vaccinated.   Discussed patient's case with [facility], Dr. 53 to request admission. Patient and family (if present) updated with plan. Care transferred to [facility] service.  I reviewed all nursing notes, vitals, pertinent old records, EKGs, labs, imaging (as available).     [facility], MD 09/28/19 1005

## 2019-09-28 NOTE — Progress Notes (Signed)
   09/28/19 1800  Assess: MEWS Score  Temp 98.5 F (36.9 C)  BP (!) 142/104  Pulse Rate (!) 118  Resp 18  Level of Consciousness Alert  SpO2 97 %  O2 Device Room Air  Assess: MEWS Score  MEWS Temp 0  MEWS Systolic 0  MEWS Pulse 2  MEWS RR 0  MEWS LOC 0  MEWS Score 2  MEWS Score Color Yellow  Assess: if the MEWS score is Yellow or Red  Were vital signs taken at a resting state? Yes  Focused Assessment Documented focused assessment  Early Detection of Sepsis Score *See Row Information* Low  MEWS guidelines implemented *See Row Information* No, previously yellow, continue vital signs every 4 hours

## 2019-09-28 NOTE — H&P (Signed)
Patient Demographics:    George Villa, is a 54 y.o. male  MRN: [id number]   DOB - 1966-03-30  Admit Date - 09/28/2019  Outpatient Primary MD for the patient is Phoncharoensri, Dittana, MD   Assessment & Plan:    Principal Problem:   UTI (urinary tract infection) Active Problems:   Fever   CAD S/P PCI of dRCA - 2 DES (2.25 x 28 Promus Premier & 2.75 x 28 Promus Premier -- postdilated to 2.75 mm) - distal RCA branches small & diffusely diseased.   H/o Prior Multiple Strokes with gait problems   Chronic back pain   Chronic pain syndrome    1)Fever and leukocytosis--- possible UTI, Rocephin as ordered pending blood and urine cultures --Patient probably did not know he had fevers because he had been taking Percocet-Which includes Tylenol about 4 times every day -He stopped taking Percocet at least 8 hours prior to coming to the ED and was found to have fevers up to 101 in the ED -Chest x-ray pending -Low clinical index of suspicion for CNS infection -Okay to stop acyclovir -No AKI per se, no hypoxia, no altered mentation, -No lactic acidosis -Patient does not meet criteria for sepsis -Leukocytosis may persist-patient got Decadron 10 mg IV in the ED  2)H/o CAD with prior angioplasty and stenting--- no chest pains or ACS type symptoms -Okay to resume Lipitor and Plavix, add low-dose metoprolol  3)Chronic pain syndrome--- stop Percocet, use oxycodone without Tylenol in order to avoid masking the fevers  4) HTN--- BP is not at goal, resume amlodipine, lisinopril, metoprolol has been added as above, may use IV labetalol as needed elevated BP  Status is: Observation   Dispo: The patient is from: Home              Anticipated d/c is to: Home              Anticipated d/c date is: 1 day              Patient  currently is not medically stable to d/c. Barriers: Not Clinically Stable-fever and leukocytosis   With History of - Reviewed by me  Past Medical History:  Diagnosis Date  . Acid reflux   . 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  . Chronic lower back pain   . Headache    "monthly" (02/18/2014)  . Hypertension   . 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 Stroke [facility]) 2006   "left leg weak since" (02/18/2014)      Past Surgical History:  Procedure Laterality Date  . BACK SURGERY    . BRAIN HEMATOMA EVACUATION  2006  . LEFT HEART CATHETERIZATION WITH CORONARY ANGIOGRAM N/A 02/17/2014   Procedure: LEFT HEART CATHETERIZATION WITH CORONARY ANGIOGRAM;  Surgeon: [name], MD;  Location: [facility] CATH LAB;  Service: Cardiovascular;  Laterality: N/A;  . LUMBAR  DISC SURGERY  2013  . PERCUTANEOUS CORONARY STENT INTERVENTION (PCI-S)  02/17/2014   Procedure: PERCUTANEOUS CORONARY STENT INTERVENTION (PCI-S);  Surgeon: [name], MD;  Location[facility] CATH LAB;  Service: Cardiovascular;;  . TRACHEOSTOMY  05/2004  . VENTRICULOSTOMY  2006      Chief Complaint  Patient presents with  . Headache      HPI:    George Villa  is a 54 y.o. male past medical history relevant for chronic headaches, history of prior hemorrhagic stroke, History of multiple strokes with gait disorder, ongoing ataxia andspasticleft hemiparesis, history of CAD, HTN,  as well as  Chronic low back pain with documented facet arthropathy and history of intradural defect/schwanomma requiring resection, currently on opiates for chronic pain syndrome -Presents to the ED with sudden onset of diffuse headache associated with nausea and emesis -Patient vomited about 4 times without bile or blood -Symptoms started around 2:30 AM on 09/27/2019 -Patient admits to chronic headaches from time to time having more severe episodes than others -- He  denies visual problems, balance problems, new numbness or tingling of his extremities, sore throat, rhinorrhea, cough, diarrhea, chest pain, shortness of breath, neck pain, abdominal pain, or any other specific symptom other than his headache.  He denies any light sensitivity but states sometimes noises make his headaches worse.  He states he took 4 Goody's this morning without relief of his headache which usually helps.  He denies any tick exposure.  He denies being around anybody else who is sick.  Patient was unaware he was having a fever, he denies any chills. -No confusion or altered mentation  -When patient was told in the ED they had fevers he admitted that he has been taking his Percocet at least 4 times a day so the Tylenol and the Percocet could have been masking his fevers at home -Patient admits to urinary frequency but no flank pain no hematuria no dysuria --CT head in the ED with evidence of encephalomalacia involving the cerebellum on the left occipital lobe, no new acute findings  -Patient denies cough or shortness of breath -No neck pain, no neck stiffness -WBC is 18.1 -Lactic acid is not elevated -Chemistry with a potassium of 3.1 -Creatinine is 1.38 no recent baseline available -UA suggestive of possible UTI -EDP obtain blood and urine cultures and give IV antibiotics and IV acyclovir   Review of systems:    In addition to the HPI above,   A full Review of  Systems was done, all other systems reviewed are negative except as noted above in HPI , .    Social History:  Reviewed by me    Social History   Tobacco Use  . Smoking status: Never Smoker  . Smokeless tobacco: Never Used  Substance Use Topics  . Alcohol use: Yes    Alcohol/week: 6.0 standard drinks    Types: 6 Cans of beer per week    Comment: OCCASSIONALLY        Family History :  Reviewed by me    Family History  Problem Relation Age of Onset  . Heart Problems Father   . Hypertension Mother     . Stroke Other   *   Home Medications:   Prior to Admission medications   Medication Sig Start Date End Date Taking? Authorizing Provider  amLODipine (NORVASC) 10 MG tablet Take 10 mg by mouth daily. 12/14/16  Yes [provider]  atorvastatin (LIPITOR) 20 MG tablet Take 20 mg by  mouth daily. 12/19/16  Yes [provider]  clopidogrel (PLAVIX) 75 MG tablet Take 1 tablet (75 mg total) by mouth daily. 09/28/15  Yes Tat, Onalee Hua, MD  ferrous sulfate 325 (65 FE) MG EC tablet Take 325 mg by mouth daily with breakfast.    Yes [provider]  lisinopril (PRINIVIL,ZESTRIL) 20 MG tablet Take 40 mg by mouth daily.  09/19/15  Yes [provider]  oxyCODONE-acetaminophen (PERCOCET) 10-325 MG tablet Take 1 tablet by mouth every 6 (six) hours as needed for pain. 09/11/19  Yes Jones Bales, NP  pantoprazole (PROTONIX) 40 MG tablet Take 40 mg by mouth daily. 01/11/17  Yes [provider]  ipratropium (ATROVENT) 0.06 % nasal spray Place 2 sprays into both nostrils 4 (four) times daily. Patient not taking: Reported on 09/28/2019    [provider]  nitroGLYCERIN (NITROSTAT) 0.4 MG SL tablet Place 1 tablet (0.4 mg total) under the tongue every 5 (five) minutes as needed for chest pain. 02/19/14   Allayne Butcher, PA-C     Allergies:    No Known Allergies   Physical Exam:   Vitals  Blood pressure (!) 142/104, pulse (!) 118, temperature 98.5 F (36.9 C), temperature source Oral, resp. rate 18, height 5\' 7"  (1.702 m), weight 89.8 kg, SpO2 97 %.  Physical Examination: General appearance - alert, well appearing, and in no distress  Mental status - alert, oriented to person, place, and time, Eyes - sclera anicteric Neck - supple, no JVD elevation , Chest - clear  to auscultation bilaterally, symmetrical air movement,  Heart - S1 and S2 normal, regular  Abdomen - soft, nontender, nondistended, no masses or organomegaly Neurological - screening mental  status exam normal, neck supple without rigidity, cranial nerves II through XII intact, DTR's normal and symmetric -Neck is supple without range of motion abnormalities, kernig and Brudzinski signs are negative Extremities - no pedal edema noted, intact peripheral pulses  Skin - warm, dry     Data Review:    CBC Recent Labs  Lab 09/28/19 0553  WBC 18.1*  HGB 16.0  HCT 48.4  PLT 256  MCV 84.8  MCH 28.0  MCHC 33.1  RDW 13.6  LYMPHSABS 0.3*  MONOABS 0.4  EOSABS 0.0  BASOSABS 0.1   ------------------------------------------------------------------------------------------------------------------  Chemistries  Recent Labs  Lab 09/28/19 0553  NA 139  K 3.1*  CL 108  CO2 17*  GLUCOSE 105*  BUN 22*  CREATININE 1.38*  CALCIUM 9.0  AST 22  ALT 28  ALKPHOS 70  BILITOT 1.1   ------------------------------------------------------------------------------------------------------------------ estimated creatinine clearance is 64.7 mL/min (A) (by C-G formula based on SCr of 1.38 mg/dL (H)). ------------------------------------------------------------------------------------------------------------------ No results for input(s): TSH, T4TOTAL, T3FREE, THYROIDAB in the last 72 hours.  Invalid input(s): FREET3   Coagulation profile No results for input(s): INR, PROTIME in the last 168 hours. ------------------------------------------------------------------------------------------------------------------- No results for input(s): DDIMER in the last 72 hours. -------------------------------------------------------------------------------------------------------------------  Cardiac Enzymes No results for input(s): CKMB, TROPONINI, MYOGLOBIN in the last 168 hours.  Invalid input(s): CK ------------------------------------------------------------------------------------------------------------------ No results found for:  BNP   ---------------------------------------------------------------------------------------------------------------  Urinalysis    Component Value Date/Time   COLORURINE AMBER (A) 09/28/2019 0554   APPEARANCEUR HAZY (A) 09/28/2019 0554   LABSPEC 1.040 (H) 09/28/2019 0554   PHURINE 5.0 09/28/2019 0554   GLUCOSEU NEGATIVE 09/28/2019 0554   HGBUR NEGATIVE 09/28/2019 0554   BILIRUBINUR SMALL (A) 09/28/2019 0554   KETONESUR 20 (A) 09/28/2019 0554   PROTEINUR 30 (A) 09/28/2019 09/30/2019  NITRITE NEGATIVE 09/28/2019 0554   LEUKOCYTESUR SMALL (A) 09/28/2019 0554    ----------------------------------------------------------------------------------------------------------------   Imaging Results:    CT Head Wo Contrast  Result Date: 09/28/2019 CLINICAL DATA:  Headache EXAM: CT HEAD WITHOUT CONTRAST TECHNIQUE: Contiguous axial images were obtained from the base of the skull through the vertex without intravenous contrast. COMPARISON:  September 28, 2015 FINDINGS: Brain: No evidence of acute territorial infarction, hemorrhage, hydrocephalus,extra-axial collection or mass lesion/mass effect. Stable areas of encephalomalacia involving the left occipital lobe and right cerebellum. There is ex vacuo dilatation of the fourth ventricle. Prior lacunar infarct seen within the right basal ganglia. There is dilatation the ventricles and sulci consistent with age-related atrophy. Low-attenuation changes in the deep white matter consistent with small vessel ischemia. Vascular: No hyperdense vessel or unexpected calcification. Skull: There is a craniectomy defect overlying the cerebellum. No fracture or focal lesion identified. Sinuses/Orbits: The visualized paranasal sinuses and mastoid air cells are clear. The orbits and globes intact. Other: None IMPRESSION: No acute intracranial abnormality. Prior areas of encephalomalacia involving the cerebellum and left occipital lobe. Findings consistent with age related atrophy  and chronic small vessel ischemia Electronically Signed   By: Prudencio Pair M.D.   On: 09/28/2019 06:49    Radiological Exams on Admission: CT Head Wo Contrast  Result Date: 09/28/2019 CLINICAL DATA:  Headache EXAM: CT HEAD WITHOUT CONTRAST TECHNIQUE: Contiguous axial images were obtained from the base of the skull through the vertex without intravenous contrast. COMPARISON:  September 28, 2015 FINDINGS: Brain: No evidence of acute territorial infarction, hemorrhage, hydrocephalus,extra-axial collection or mass lesion/mass effect. Stable areas of encephalomalacia involving the left occipital lobe and right cerebellum. There is ex vacuo dilatation of the fourth ventricle. Prior lacunar infarct seen within the right basal ganglia. There is dilatation the ventricles and sulci consistent with age-related atrophy. Low-attenuation changes in the deep white matter consistent with small vessel ischemia. Vascular: No hyperdense vessel or unexpected calcification. Skull: There is a craniectomy defect overlying the cerebellum. No fracture or focal lesion identified. Sinuses/Orbits: The visualized paranasal sinuses and mastoid air cells are clear. The orbits and globes intact. Other: None IMPRESSION: No acute intracranial abnormality. Prior areas of encephalomalacia involving the cerebellum and left occipital lobe. Findings consistent with age related atrophy and chronic small vessel ischemia Electronically Signed   By: Prudencio Pair M.D.   On: 09/28/2019 06:49    DVT Prophylaxis -SCD   AM Labs Ordered, also please review Full Orders  Family Communication: Admission, patients condition and plan of care including tests being ordered have been discussed with the patient  who indicate understanding and agree with the plan   Code Status - Full Code  Likely DC to  home in 24 to 48 hours if no further fevers  Condition   stable Roxan Hockey M.D on 09/28/2019 at 6:44 PM Go to [email or web address] -  for contact info  [facility] - Office  [phone] \

## 2019-09-28 NOTE — Progress Notes (Signed)
Pharmacy Antibiotic Note  George Villa is a 54 y.o. 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  Pharmacy has been consulted for acyclovir dosing.  Plan: Start acyclovir 750mg  IV q8h Pharmacy will monitor renal function, cultures and patient progress.  Height: 5\' 7"  (170.2 cm) Weight: 90.1 kg (198 lb 10.2 oz) IBW/kg (Calculated) : 66.1  Temp (24hrs), Avg:99.9 F (37.7 C), Min:98.5 F (36.9 C), Max:101.1 F (38.4 C)  Recent Labs  Lab 09/28/19 0553 09/28/19 0754  WBC 18.1*  --   CREATININE 1.38*  --   LATICACIDVEN 1.3 1.4    Estimated Creatinine Clearance: 64.8 mL/min (A) (by C-G formula based on SCr of 1.38 mg/dL (H)).    No Known Allergies  Antimicrobials this admission: acyclovir 6/21 >>  ceftriaxone 6/21 >> x1 dose in ED vancomycin 6/21>> x1 dose in ED   Microbiology results: 6/21 [employer] x2:  NG <12h 6/21  UCx:   6/21 Resp PCR: SARS CoV-2:   Thank you for allowing pharmacy to be a part of this patient's care.  7/21 09/28/2019 11:14 AM

## 2019-09-29 DIAGNOSIS — N3000 Acute cystitis without hematuria: Secondary | ICD-10-CM | POA: Diagnosis not present

## 2019-09-29 DIAGNOSIS — M5441 Lumbago with sciatica, right side: Secondary | ICD-10-CM | POA: Diagnosis not present

## 2019-09-29 DIAGNOSIS — G8929 Other chronic pain: Secondary | ICD-10-CM | POA: Diagnosis not present

## 2019-09-29 DIAGNOSIS — R066 Hiccough: Secondary | ICD-10-CM

## 2019-09-29 DIAGNOSIS — R509 Fever, unspecified: Secondary | ICD-10-CM | POA: Diagnosis not present

## 2019-09-29 DIAGNOSIS — M5442 Lumbago with sciatica, left side: Secondary | ICD-10-CM | POA: Diagnosis not present

## 2019-09-29 LAB — BASIC METABOLIC PANEL
Anion gap: 5 (ref 5–15)
BUN: 22 mg/dL — ABNORMAL HIGH (ref 6–20)
CO2: 19 mmol/L — ABNORMAL LOW (ref 22–32)
Calcium: 9 mg/dL (ref 8.9–10.3)
Chloride: 115 mmol/L — ABNORMAL HIGH (ref 98–111)
Creatinine, Ser: 1.1 mg/dL (ref 0.61–1.24)
GFR calc Af Amer: >60 mL/min (ref >60)
GFR calc non Af Amer: >60 mL/min (ref >60)
Glucose, Bld: 134 mg/dL — ABNORMAL HIGH (ref 70–99)
Potassium: 4.5 mmol/L (ref 3.5–5.1)
Sodium: 139 mmol/L (ref 135–145)

## 2019-09-29 LAB — CBC
HCT: 43 % (ref 39.0–52.0)
Hemoglobin: 14.2 g/dL (ref 13.0–17.0)
MCH: 28.1 pg (ref 26.0–34.0)
MCHC: 33 g/dL (ref 30.0–36.0)
MCV: 85.1 fL (ref 80.0–100.0)
Platelets: 236 10*3/uL (ref 150–400)
RBC: 5.05 MIL/uL (ref 4.22–5.81)
RDW: 13.9 % (ref 11.5–15.5)
WBC: 23.1 10*3/uL — ABNORMAL HIGH (ref 4.0–10.5)
nRBC: 0 % (ref 0.0–0.2)

## 2019-09-29 LAB — URINE CULTURE
Culture: <10000 — AB
Special Requests: NORMAL

## 2019-09-29 LAB — HIV ANTIBODY (ROUTINE TESTING W REFLEX): HIV Screen 4th Generation wRfx: NONREACTIVE

## 2019-09-29 MED ORDER — METOPROLOL SUCCINATE ER 25 MG PO TB24: 25.0000 mg | ORAL_TABLET | Freq: Every day | ORAL | 11 refills | Status: AC

## 2019-09-29 MED ORDER — CEFDINIR 300 MG PO CAPS
300.0000 mg | ORAL_CAPSULE | Freq: Two times a day (BID) | ORAL | 0 refills | Status: AC
Start: 2019-09-29 — End: 2019-10-04

## 2019-09-29 MED ORDER — PROCHLORPERAZINE EDISYLATE 10 MG/2ML IJ SOLN
10.0000 mg | Freq: Once | INTRAMUSCULAR | Status: AC
  Administered 2019-09-29: 10 mg via INTRAVENOUS
  Filled 2019-09-29: qty 2

## 2019-09-29 MED ORDER — CHLORPROMAZINE HCL 25 MG/ML IJ SOLN
25.0000 mg | Freq: Once | INTRAMUSCULAR | Status: AC
  Administered 2019-09-29: 25 mg via INTRAMUSCULAR
  Filled 2019-09-29: qty 1

## 2019-09-29 NOTE — Discharge Summary (Signed)
George Villa, is a 54 y.o. male  DOB [birth year]  MRN [id number].  Admission date:  09/28/2019  Admitting Physician  Shon Hale, MD  Discharge Date:  09/29/2019   Primary MD  Lahoma Crocker, MD  Recommendations for primary care physician for things to follow:   1) please call or return if fevers, chills, night sweats, cough headaches or difficulty with urination 2) you are taking Plavix which is a blood thinner so Avoid ibuprofen/Advil/Aleve/Motrin/Goody Powders/Naproxen/BC powders/Meloxicam/Diclofenac/Indomethacin and other Nonsteroidal anti-inflammatory medications as these will make you more likely to bleed and can cause stomach ulcers, can also cause Kidney problems.   Admission Diagnosis  UTI (urinary tract infection) [N39.0] Fever, unspecified fever cause [R50.9] Acute nonintractable headache, unspecified headache type [R51.9]   Discharge Diagnosis  UTI (urinary tract infection) [N39.0] Fever, unspecified fever cause [R50.9] Acute nonintractable headache, unspecified headache type [R51.9]    Principal Problem:   UTI (urinary tract infection) Active Problems:   Fever   CAD S/P PCI of dRCA - 2 DES (2.25 x 28 Promus Premier & 2.75 x 28 Promus Premier -- postdilated to 2.75 mm) - distal RCA branches small & diffusely diseased.   H/o Prior Multiple Strokes with gait problems   Chronic back pain   Chronic pain syndrome      Past Medical History:  Diagnosis Date  . Acid reflux   . 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  . Chronic lower back pain   . Headache    "monthly" (02/18/2014)  . Hypertension   . 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 Stroke [facility]) 2006   "left leg weak since" (02/18/2014)    Past Surgical History:  Procedure Laterality Date  . BACK SURGERY    . BRAIN HEMATOMA  EVACUATION  2006  . LEFT HEART CATHETERIZATION WITH CORONARY ANGIOGRAM N/A 02/17/2014   Procedure: LEFT HEART CATHETERIZATION WITH CORONARY ANGIOGRAM;  Surgeon: [name], MD;  Location: [facility] CATH LAB;  Service: Cardiovascular;  Laterality: N/A;  . LUMBAR DISC SURGERY  2013  . PERCUTANEOUS CORONARY STENT INTERVENTION (PCI-S)  02/17/2014   Procedure: PERCUTANEOUS CORONARY STENT INTERVENTION (PCI-S);  Surgeon: [name], MD;  Location: [facility] CATH LAB;  Service: Cardiovascular;;  . TRACHEOSTOMY  05/2004  . VENTRICULOSTOMY  2006     HPI  from the history and physical done on the day of admission:    George Villa  is a 54 y.o. male past medical history relevant for chronic headaches, history of prior hemorrhagic stroke, History of multiple strokes with gait disorder, ongoing ataxia andspasticleft hemiparesis, history of CAD, HTN,  as well as  Chronic low back pain with documented facet arthropathy and history of intradural defect/schwanomma requiring resection, currently on opiates for chronic pain syndrome -Presents to the ED with sudden onset of diffuse headache associated with nausea and emesis -Patient vomited about 4 times without bile or blood -Symptoms started around 2:30 AM on 09/27/2019 -Patient admits  to chronic headaches from time to time having more severe episodes than others --He denies visual problems, balance problems, new numbness or tingling of his extremities, sore throat, rhinorrhea, cough, diarrhea, chest pain, shortness of breath, neck pain, abdominal pain, or any other specific symptom other than his headache. He denies any light sensitivity but states sometimes noises make his headaches worse. He states he took 4 Goody's this morning without relief of his headache which usually helps. He denies any tick exposure. He denies being around anybody else who is sick. Patient was unaware he was having a fever, he denies any chills. -No confusion or altered  mentation  -When patient was told in the ED they had fevers he admitted that he has been taking his Percocet at least 4 times a day so the Tylenol and the Percocet could have been masking his fevers at home -Patient admits to urinary frequency but no flank pain no hematuria no dysuria --CT head in the ED with evidence of encephalomalacia involving the cerebellum on the left occipital lobe, no new acute findings  -Patient denies cough or shortness of breath -No neck pain, no neck stiffness -WBC is 18.1 -Lactic acid is not elevated -Chemistry with a potassium of 3.1 -Creatinine is 1.38 no recent baseline available -UA suggestive of possible UTI -EDP obtain blood and urine cultures and give IV antibiotics and IV acyclovir     Hospital Course:      1)Fever and leukocytosis---  no further fevers, no antipyretics, -Leukocytosis persist but patient received Decadron 10 mg on 09/28/2019 -Urine culture without significant growth -Chest x-ray without acute cardiopulmonary findings -Blood cultures NGTD -Patient was treated with IV Rocephin -Okay to discharge on p.o. Omnicef -Low clinical index of suspicion for CNS infection -Patient does not meet criteria for sepsis -Leukocytosis may persist-patient got Decadron 10 mg IV in the ED  2)H/o CAD with prior angioplasty and stenting--- no chest pains or ACS type symptoms Continue Lipitor and Plavix, added metoprolol XL 25 mg daily  3)Chronic pain syndrome--- okay to resume home dose Percocet, however the acetaminophen may be masking patient's fevers -During hospital stay patient got oxycodone without acetaminophen--he remained afebrile despite this  4) HTN---  continue amlodipine, lisinopril, metoprolol has been added as above,   Discharge Condition: stable  Follow UP   Follow-up Information    Phoncharoensri, Dittana, MD. Schedule an appointment as soon as possible for a visit in 1 week(s).   Specialty: Internal Medicine Contact  information: [address] [place] [address] [phone]                Consults obtained -   Diet and Activity recommendation:  As advised  Discharge Instructions    Discharge Instructions    Call MD for:  difficulty breathing, headache or visual disturbances   Complete by: As directed    Call MD for:  extreme fatigue   Complete by: As directed    Call MD for:  persistant dizziness or light-headedness   Complete by: As directed    Call MD for:  persistant nausea and vomiting   Complete by: As directed    Call MD for:  severe uncontrolled pain   Complete by: As directed    Call MD for:  temperature >100.4   Complete by: As directed    Diet - low sodium heart healthy   Complete by: As directed    Discharge instructions   Complete by: As directed    1) please call or  return if fevers, chills, night sweats, cough headaches or difficulty with urination 2) you are taking Plavix which is a blood thinner so Avoid ibuprofen/Advil/Aleve/Motrin/Goody Powders/Naproxen/BC powders/Meloxicam/Diclofenac/Indomethacin and other Nonsteroidal anti-inflammatory medications as these will make you more likely to bleed and can cause stomach ulcers, can also cause Kidney problems.   Increase activity slowly   Complete by: As directed         Discharge Medications     Allergies as of 09/29/2019   No Known Allergies     Medication List    STOP taking these medications   ipratropium 0.06 % nasal spray Commonly known as: ATROVENT     TAKE these medications   amLODipine 10 MG tablet Commonly known as: NORVASC Take 10 mg by mouth daily.   atorvastatin 20 MG tablet Commonly known as: LIPITOR Take 20 mg by mouth daily.   cefdinir 300 MG capsule Commonly known as: OMNICEF Take 1 capsule (300 mg total) by mouth 2 (two) times daily for 5 days.   clopidogrel 75 MG tablet Commonly known as: PLAVIX Take 1 tablet (75 mg total) by mouth daily.   ferrous sulfate 325 (65 FE)  MG EC tablet Take 325 mg by mouth daily with breakfast.   lisinopril 20 MG tablet Commonly known as: ZESTRIL Take 40 mg by mouth daily.   metoprolol succinate 25 MG 24 hr tablet Commonly known as: Toprol XL Take 1 tablet (25 mg total) by mouth daily. For Heart   nitroGLYCERIN 0.4 MG SL tablet Commonly known as: NITROSTAT Place 1 tablet (0.4 mg total) under the tongue every 5 (five) minutes as needed for chest pain.   oxyCODONE-acetaminophen 10-325 MG tablet Commonly known as: Percocet Take 1 tablet by mouth every 6 (six) hours as needed for pain.   pantoprazole 40 MG tablet Commonly known as: PROTONIX Take 40 mg by mouth daily.       Major procedures and Radiology Reports - PLEASE review detailed and final reports for all details, in brief -    DG Chest 2 View  Result Date: 09/28/2019 CLINICAL DATA:  Dyspnea and respiratory abnormalities. EXAM: CHEST - 2 VIEW COMPARISON:  Chest radiograph 09/27/2015, chest CT 04/19/2011 FINDINGS: The cardiomediastinal contours are normal. Left retrocardiac opacity is chronic and represents a hiatal hernia on prior CT. The lungs are clear. Pulmonary vasculature is normal. No consolidation, pleural effusion, or pneumothorax. No acute osseous abnormalities are seen. IMPRESSION: 1. No acute chest findings. 2. Hiatal hernia. Electronically Signed   By: Narda Rutherford M.D.   On: 09/28/2019 19:48   CT Head Wo Contrast  Result Date: 09/28/2019 CLINICAL DATA:  Headache EXAM: CT HEAD WITHOUT CONTRAST TECHNIQUE: Contiguous axial images were obtained from the base of the skull through the vertex without intravenous contrast. COMPARISON:  September 28, 2015 FINDINGS: Brain: No evidence of acute territorial infarction, hemorrhage, hydrocephalus,extra-axial collection or mass lesion/mass effect. Stable areas of encephalomalacia involving the left occipital lobe and right cerebellum. There is ex vacuo dilatation of the fourth ventricle. Prior lacunar infarct seen  within the right basal ganglia. There is dilatation the ventricles and sulci consistent with age-related atrophy. Low-attenuation changes in the deep white matter consistent with small vessel ischemia. Vascular: No hyperdense vessel or unexpected calcification. Skull: There is a craniectomy defect overlying the cerebellum. No fracture or focal lesion identified. Sinuses/Orbits: The visualized paranasal sinuses and mastoid air cells are clear. The orbits and globes intact. Other: None IMPRESSION: No acute intracranial abnormality. Prior areas of encephalomalacia involving the cerebellum  and left occipital lobe. Findings consistent with age related atrophy and chronic small vessel ischemia Electronically Signed   By: Jonna ClarkBindu  Avutu M.D.   On: 09/28/2019 06:49    Micro Results    Recent Results (from the past 240 hour(s))  Culture, blood (routine x 2)     Status: None (Preliminary result)   Collection Time: 09/28/19  5:53 AM   Specimen: BLOOD  Result Value Ref Range Status   Specimen Description BLOOD LEFT ANTECUBITAL  Final   Special Requests   Final    BOTTLES DRAWN AEROBIC AND ANAEROBIC Blood Culture adequate volume   Culture   Final    NO GROWTH 1 DAY Performed at [facility], [address] [place], [place] [address]    Report Status PENDING  Incomplete  Culture, blood (routine x 2)     Status: None (Preliminary result)   Collection Time: 09/28/19  5:54 AM   Specimen: BLOOD  Result Value Ref Range Status   Specimen Description BLOOD RIGHT ANTECUBITAL  Final   Special Requests   Final    BOTTLES DRAWN AEROBIC AND ANAEROBIC Blood Culture adequate volume   Culture   Final    NO GROWTH 1 DAY Performed at [facility], [address] [place], [place] [address]    Report Status PENDING  Incomplete  Urine culture     Status: Abnormal   Collection Time: 09/28/19  5:54 AM   Specimen: Urine, Clean Catch  Result Value Ref Range Status   Specimen Description   Final    URINE, CLEAN  CATCH Performed at [facility], [address] [place], [place] [address]    Special Requests   Final    Normal Performed at [facility], [address] [place], [place] [address]    Culture (A)  Final    <10,000 COLONIES/mL INSIGNIFICANT GROWTH Performed at [facility], [address] [place], [place] [address]    Report Status 09/29/2019 FINAL  Final  SARS Coronavirus 2 by RT PCR (hospital order, performed in [facility] hospital lab) Nasopharyngeal Nasopharyngeal Swab     Status: None   Collection Time: 09/28/19 11:04 AM   Specimen: Nasopharyngeal Swab  Result Value Ref Range Status   SARS Coronavirus 2 NEGATIVE NEGATIVE Final    Comment: (NOTE) SARS-CoV-2 target nucleic acids are NOT DETECTED.  The SARS-CoV-2 RNA is generally detectable in upper and lower respiratory specimens during the acute phase of infection. The lowest concentration of SARS-CoV-2 viral copies this assay can detect is 250 copies / mL. A negative result does not preclude SARS-CoV-2 infection and should not be used as the sole basis for treatment or other patient management decisions.  A negative result may occur with improper specimen collection / handling, submission of specimen other than nasopharyngeal swab, presence of viral mutation(s) within the areas targeted by this assay, and inadequate number of viral copies (<250 copies / mL). A negative result must be combined with clinical observations, patient history, and epidemiological information.  Fact Sheet for Patients:   BoilerBrush.com.cyhttps://www.fda.gov/media/136312/download  Fact Sheet for Healthcare Providers: https://pope.com/https://www.fda.gov/media/136313/download  This test is not yet approved or  cleared by the [place] FDA and has been authorized for detection and/or diagnosis of SARS-CoV-2 by FDA under an Emergency Use Authorization (EUA).  This EUA will remain in effect (meaning this test can be used) for the duration of the COVID-19 declaration  under Section 564(b)(1) of the Act, 21 U.S.C. section 360bbb-3(b)(1), unless the authorization is terminated  or revoked sooner.  Performed at [facility], [address] [place], [place] [address]        Today   Subjective    George Villa today has no new complaints -Remains afebrile, no further  headache, no nausea, -        -Hiccups have resolved, -No urinary symptoms at this time -No cough, no abdominal pain,   Patient has been seen and examined prior to discharge   Objective   Blood pressure 112/79, pulse 83, temperature (!) 97.5 F (36.4 C), temperature source Oral, resp. rate 20, height 5\' 7"  (1.702 m), weight 89.8 kg, SpO2 97 %.   Intake/Output Summary (Last 24 hours) at 09/29/2019 1519 Last data filed at 09/28/2019 1900 Gross per 24 hour  Intake 240 ml  Output 480 ml  Net -240 ml    Exam Gen:- Awake Alert, no acute distress  HEENT:- [place].AT, No sclera icterus Neck-Supple Neck,No JVD,.  Lungs-  CTAB , good air movement bilaterally  CV- S1, S2 normal, regular Abd-  +ve B.Sounds, Abd Soft, No tenderness,   Extremity/Skin:- No  edema,   good pulses Psych-affect is appropriate, oriented x3 Neuro-no new focal deficits, no tremors ,  Neck is supple without range of motion abnormalities, kernig and Brudzinski signs are negative   Data Review   CBC w Diff:  Lab Results  Component Value Date   WBC 23.1 (H) 09/29/2019   HGB 14.2 09/29/2019   HCT 43.0 09/29/2019   PLT 236 09/29/2019   LYMPHOPCT 1 09/28/2019   MONOPCT 2 09/28/2019   EOSPCT 0 09/28/2019   BASOPCT 0 09/28/2019    CMP:  Lab Results  Component Value Date   NA 139 09/29/2019   K 4.5 09/29/2019   CL 115 (H) 09/29/2019   CO2 19 (L) 09/29/2019   BUN 22 (H) 09/29/2019   CREATININE 1.10 09/29/2019   PROT 7.4 09/28/2019   ALBUMIN 3.7 09/28/2019   BILITOT 1.1 09/28/2019   ALKPHOS 70 09/28/2019   AST 22 09/28/2019   ALT 28 09/28/2019  .   Total Discharge time is about 33  minutes  Roxan Hockey M.D on 09/29/2019 at 3:19 PM  Go to [email or web address] -  for contact info  [facility] - Office  [phone]

## 2019-09-29 NOTE — Progress Notes (Signed)
[facility] night shift.  The staff reported that the patient has been having persistent hiccups.  Compazine 10 mg IVP x1 dose ordered.  Sanda Klein, MD

## 2019-09-29 NOTE — Discharge Instructions (Signed)
1) please call or return if fevers, chills, night sweats, cough headaches or difficulty with urination 2) you are taking Plavix which is a blood thinner so Avoid ibuprofen/Advil/Aleve/Motrin/Goody Powders/Naproxen/BC powders/Meloxicam/Diclofenac/Indomethacin and other Nonsteroidal anti-inflammatory medications as these will make you more likely to bleed and can cause stomach ulcers, can also cause Kidney problems.

## 2019-09-29 NOTE — Plan of Care (Signed)

## 2019-10-03 LAB — CULTURE, BLOOD (ROUTINE X 2)
Culture: NO GROWTH
Culture: NO GROWTH
Special Requests: ADEQUATE
Special Requests: ADEQUATE

## 2019-10-09 ENCOUNTER — Other Ambulatory Visit: Payer: Self-pay

## 2019-10-09 ENCOUNTER — Encounter: Payer: Medicaid Other | Attending: Physical Medicine & Rehabilitation | Admitting: Registered Nurse

## 2019-10-09 ENCOUNTER — Encounter: Payer: Self-pay | Admitting: Registered Nurse

## 2019-10-09 VITALS — BP 127/89 | HR 75 | Temp 98.4°F | Ht 67.0 in | Wt 195.0 lb

## 2019-10-09 DIAGNOSIS — G894 Chronic pain syndrome: Secondary | ICD-10-CM | POA: Diagnosis not present

## 2019-10-09 DIAGNOSIS — I252 Old myocardial infarction: Secondary | ICD-10-CM | POA: Insufficient documentation

## 2019-10-09 DIAGNOSIS — R262 Difficulty in walking, not elsewhere classified: Secondary | ICD-10-CM | POA: Insufficient documentation

## 2019-10-09 DIAGNOSIS — M545 Low back pain: Secondary | ICD-10-CM | POA: Insufficient documentation

## 2019-10-09 DIAGNOSIS — G8114 Spastic hemiplegia affecting left nondominant side: Secondary | ICD-10-CM

## 2019-10-09 DIAGNOSIS — Z79891 Long term (current) use of opiate analgesic: Secondary | ICD-10-CM | POA: Diagnosis not present

## 2019-10-09 DIAGNOSIS — Z5181 Encounter for therapeutic drug level monitoring: Secondary | ICD-10-CM | POA: Diagnosis not present

## 2019-10-09 DIAGNOSIS — Z760 Encounter for issue of repeat prescription: Secondary | ICD-10-CM | POA: Insufficient documentation

## 2019-10-09 DIAGNOSIS — M47816 Spondylosis without myelopathy or radiculopathy, lumbar region: Secondary | ICD-10-CM

## 2019-10-09 DIAGNOSIS — R269 Unspecified abnormalities of gait and mobility: Secondary | ICD-10-CM | POA: Diagnosis not present

## 2019-10-09 DIAGNOSIS — I10 Essential (primary) hypertension: Secondary | ICD-10-CM | POA: Insufficient documentation

## 2019-10-09 DIAGNOSIS — Z955 Presence of coronary angioplasty implant and graft: Secondary | ICD-10-CM | POA: Diagnosis not present

## 2019-10-09 DIAGNOSIS — G8929 Other chronic pain: Secondary | ICD-10-CM | POA: Insufficient documentation

## 2019-10-09 DIAGNOSIS — Z8673 Personal history of transient ischemic attack (TIA), and cerebral infarction without residual deficits: Secondary | ICD-10-CM | POA: Diagnosis not present

## 2019-10-09 MED ORDER — OXYCODONE-ACETAMINOPHEN 10-325 MG PO TABS: 1.0000 | ORAL_TABLET | Freq: Four times a day (QID) | ORAL | 0 refills | Status: DC | PRN

## 2019-10-09 NOTE — Progress Notes (Signed)
Subjective:    Patient ID: George Villa, male    DOB: October 28, 1965, 54 y.o.   MRN: [id number]  HPI: George Villa is a 54 y.o. male who returns for follow up appointment for chronic pain and medication refill. He states his pain is located in his lower back pain. She rates her pain 8. Her current exercise regime is walking and riding his stationary bicycle for 30 minutes 5 days a week.  George Villa was admitted to [facility] on 09/28/2019 , he presented with sudden onset of diffuse headache associated with nausea and emesis and UTI. He was discharged on 09/29/2019, discharge note was reviewed.   Ms. Thackston Morphine equivalent is 60.00 MME.    Last UDS was Performed on 09/11/2019, was positive for ETOH, letter was sent. He was educated today and verbalizes understanding.    Pain Inventory Average Pain 8 Pain Right Now 8 My pain is dull  In the last 24 hours, has pain interfered with the following? General activity 8 Relation with others 7 Enjoyment of life 7 What TIME of day is your pain at its worst? night Sleep (in general) Fair  Pain is worse with: walking and bending Pain improves with: medication Relief from Meds: 9  Mobility use a cane do you drive?  yes  Function disabled: date disabled .  Neuro/Psych weakness  Prior Studies Any changes since last visit?  yes Hospitalization @ [facility]  Physicians involved in your care Any changes since last visit?  no     Family History  Problem Relation Age of Onset  . Heart Problems Father   . Hypertension Mother   . Stroke Other    Social History   Socioeconomic History  . Marital status: Single    Spouse name: Not on file  . Number of children: Not on file  . Years of education: Not on file  . Highest education level: Not on file  Occupational History  . Occupation: umemployed     Comment: Works in [employer]  . Smoking status: Never Smoker  . Smokeless tobacco: Never Used  Substance and  Sexual Activity  . Alcohol use: Yes    Alcohol/week: 6.0 standard drinks    Types: 6 Cans of beer per week    Comment: OCCASSIONALLY   . Drug use: No  . Sexual activity: Yes  Other Topics Concern  . Not on file  Social History Narrative   7 cups of caffeine a week    Social Determinants of Health   Financial Resource Strain:   . Difficulty of Paying Living Expenses:   Food Insecurity:   . Worried About Programme researcher, broadcasting/film/video in the Last Year:   . Barista in the Last Year:   Transportation Needs:   . Freight forwarder (Medical):   Marland Kitchen Lack of Transportation (Non-Medical):   Physical Activity:   . Days of Exercise per Week:   . Minutes of Exercise per Session:   Stress:   . Feeling of Stress :   Social Connections:   . Frequency of Communication with Friends and Family:   . Frequency of Social Gatherings with Friends and Family:   . Attends Religious Services:   . Active Member of Clubs or Organizations:   . Attends Banker Meetings:   Marland Kitchen Marital Status:    Past Surgical History:  Procedure Laterality Date  . BACK SURGERY    . BRAIN HEMATOMA EVACUATION  2006  . LEFT HEART CATHETERIZATION WITH CORONARY ANGIOGRAM N/A 02/17/2014   Procedure: LEFT HEART CATHETERIZATION WITH CORONARY ANGIOGRAM;  Surgeon: [name], MD;  Location: [facility] CATH LAB;  Service: Cardiovascular;  Laterality: N/A;  . LUMBAR DISC SURGERY  2013  . PERCUTANEOUS CORONARY STENT INTERVENTION (PCI-S)  02/17/2014   Procedure: PERCUTANEOUS CORONARY STENT INTERVENTION (PCI-S);  Surgeon: [name], MD;  Location: [facility] CATH LAB;  Service: Cardiovascular;;  . TRACHEOSTOMY  05/2004  . VENTRICULOSTOMY  2006   Past Medical History:  Diagnosis Date  . Acid reflux   . 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  . Chronic lower back pain   . Headache    "monthly" (02/18/2014)  . Hypertension   . 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 Stroke [facility]) 2006   "left leg weak since" (02/18/2014)   BP 127/89   Pulse 75   Temp 98.4 F (36.9 C)   Ht 5\' 7"  (1.702 m)   Wt 195 lb (88.5 kg)   SpO2 98%   BMI 30.54 kg/m   Opioid Risk Score:   Fall Risk Score:  `1  Depression screen PHQ 2/9  Depression screen [facility] 2/9 10/09/2019 09/11/2019 08/12/2019 03/04/2019 01/05/2019 09/10/2018 08/05/2018  Decreased Interest 0 0 0 0 0 0 0  Down, Depressed, Hopeless 0 0 0 0 0 0 0  PHQ - 2 Score 0 0 0 0 0 0 0   Review of Systems  Constitutional: Negative.   HENT: Negative.   Eyes: Negative.   Respiratory: Negative.   Cardiovascular: Negative.   Gastrointestinal: Negative.   Endocrine: Negative.   Genitourinary: Negative.   Musculoskeletal: Positive for gait problem.  Skin: Negative.   Allergic/Immunologic: Negative.   Neurological: Positive for weakness.  Psychiatric/Behavioral: Negative.   All other systems reviewed and are negative.      Objective:   Physical Exam Vitals and nursing note reviewed.  Constitutional:      Appearance: Normal appearance.  Cardiovascular:     Rate and Rhythm: Normal rate and regular rhythm.     Pulses: Normal pulses.     Heart sounds: Normal heart sounds.  Pulmonary:     Effort: Pulmonary effort is normal.     Breath sounds: Normal breath sounds.  Musculoskeletal:     Cervical back: Normal range of motion and neck supple.     Comments: Normal Muscle Bulk and Muscle Testing Reveals:  Upper Extremities: Full ROM and Muscle Strength 5/5 Lumbar Paraspinal Tenderness: L-4_L-5 Lower Extremities: Full ROM and Muscle Strength 5/5 Arises from chair slowly Antalgic Gait   Skin:    General: Skin is warm and dry.  Neurological:     Mental Status: He is alert and oriented to person, place, and time.  Psychiatric:        Mood and Affect: Mood normal.        Behavior: Behavior normal.           Assessment & Plan:  1. History of multiple strokes with gait disorder,  ongoing ataxia andspasticleft hemiparesis: ContinueHEP as Tolerated.10/09/2019 2. Chronic low back pain with documented facet arthropathy and history of intradural defect/schwanomma requiring resection, per Dr. 12/10/2019 Note. Continue current medication regimen. Refilled:Percocet10/325 one q6 prn #120.10/09/2019 We will continue the opioid monitoring program, this consists of regular clinic visits, examinations, routine drug screening, pill counts as well as use of 12/10/2019 Controlled Substance Reporting System. [place]  [place].  F/U in 1 month  of face to face patient care time was spent during this visit. All questions were encouraged and answered.

## 2019-10-28 ENCOUNTER — Telehealth: Payer: Self-pay | Admitting: *Deleted

## 2019-10-28 NOTE — Telephone Encounter (Signed)
Prior authorization for oxycodone acetaminophen Approved  10/23/2019 - 04/23/2020

## 2019-11-06 ENCOUNTER — Encounter: Payer: Medicaid Other | Admitting: Registered Nurse

## 2019-11-06 ENCOUNTER — Encounter: Payer: Self-pay | Admitting: Registered Nurse

## 2019-11-06 ENCOUNTER — Other Ambulatory Visit: Payer: Self-pay

## 2019-11-06 VITALS — BP 142/90 | HR 68 | Temp 98.8°F | Ht 67.0 in | Wt 196.0 lb

## 2019-11-06 DIAGNOSIS — Z5181 Encounter for therapeutic drug level monitoring: Secondary | ICD-10-CM | POA: Diagnosis not present

## 2019-11-06 DIAGNOSIS — M47816 Spondylosis without myelopathy or radiculopathy, lumbar region: Secondary | ICD-10-CM

## 2019-11-06 DIAGNOSIS — G894 Chronic pain syndrome: Secondary | ICD-10-CM

## 2019-11-06 DIAGNOSIS — R269 Unspecified abnormalities of gait and mobility: Secondary | ICD-10-CM | POA: Diagnosis not present

## 2019-11-06 DIAGNOSIS — Z79891 Long term (current) use of opiate analgesic: Secondary | ICD-10-CM

## 2019-11-06 MED ORDER — OXYCODONE-ACETAMINOPHEN 10-325 MG PO TABS: 1.0000 | ORAL_TABLET | Freq: Four times a day (QID) | ORAL | 0 refills | Status: DC | PRN

## 2019-11-06 NOTE — Progress Notes (Signed)
Subjective:    Patient ID: George Villa, male    DOB: 1966/04/02, 54 y.o.   MRN: [id number]  HPI: George Villa is a 54 y.o. male who returns for follow up appointment for chronic pain and medication refill. He states his pain is located in his lower back. He rates his pain 8. His  current exercise regime is walking, riding his stationary bicycle for 30 minutes 5 days a week and performing stretching exercises.  George Villa Villa equivalent is 60.00 MME.  Last UDS was Performed on 09/11/2019, it was consistent for Oxycodone. Positive for ETOH, George Villa was educated on the narcotic policy and verbalizes understanding.   Pain Inventory Average Pain 8 Pain Right Now 8 My pain is dull  In the last 24 hours, has pain interfered with the following? General activity 7 Relation with others 7 Enjoyment of life 7 What TIME of day is your pain at its worst? night Sleep (in general) Fair  Pain is worse with: walking and bending Pain improves with: medication Relief from Meds: 9  Mobility use a cane how many minutes can you walk? 30 ability to climb steps?  yes do you drive?  yes  Function disabled: date disabled .  Neuro/Psych weakness trouble walking  Prior Studies Any changes since last visit?  no  Physicians involved in your care Any changes since last visit?  no   Family History  Problem Relation Age of Onset  . Heart Problems Father   . Hypertension Mother   . Stroke Other    Social History   Socioeconomic History  . Marital status: Single    Spouse name: Not on file  . Number of children: Not on file  . Years of education: Not on file  . Highest education level: Not on file  Occupational History  . Occupation: umemployed     Comment: Works in [employer]  . Smoking status: Never Smoker  . Smokeless tobacco: Never Used  Substance and Sexual Activity  . Alcohol use: Yes    Alcohol/week: 6.0 standard drinks    Types: 6 Cans of beer  per week    Comment: OCCASSIONALLY   . Drug use: No  . Sexual activity: Yes  Other Topics Concern  . Not on file  Social History Narrative   7 cups of caffeine a week    Social Determinants of Health   Financial Resource Strain:   . Difficulty of Paying Living Expenses:   Food Insecurity:   . Worried About Programme researcher, broadcasting/film/video in the Last Year:   . Barista in the Last Year:   Transportation Needs:   . Freight forwarder (Medical):   Marland Kitchen Lack of Transportation (Non-Medical):   Physical Activity:   . Days of Exercise per Week:   . Minutes of Exercise per Session:   Stress:   . Feeling of Stress :   Social Connections:   . Frequency of Communication with Friends and Family:   . Frequency of Social Gatherings with Friends and Family:   . Attends Religious Services:   . Active Member of Clubs or Organizations:   . Attends Banker Meetings:   Marland Kitchen Marital Status:    Past Surgical History:  Procedure Laterality Date  . BACK SURGERY    . BRAIN HEMATOMA EVACUATION  2006  . LEFT HEART CATHETERIZATION WITH CORONARY ANGIOGRAM N/A 02/17/2014   Procedure: LEFT HEART CATHETERIZATION WITH CORONARY ANGIOGRAM;  Surgeon: [name], MD;  Location: [facility] CATH LAB;  Service: Cardiovascular;  Laterality: N/A;  . LUMBAR DISC SURGERY  2013  . PERCUTANEOUS CORONARY STENT INTERVENTION (PCI-S)  02/17/2014   Procedure: PERCUTANEOUS CORONARY STENT INTERVENTION (PCI-S);  Surgeon: [name], MD;  Location: [facility] CATH LAB;  Service: Cardiovascular;;  . TRACHEOSTOMY  05/2004  . VENTRICULOSTOMY  2006   Past Medical History:  Diagnosis Date  . Acid reflux   . 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  . Chronic lower back pain   . Headache    "monthly" (02/18/2014)  . Hypertension   . 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 Stroke [facility]) 2006   "left leg weak since" (02/18/2014)   BP (!)  142/90   Pulse 68   Temp 98.8 F (37.1 C)   Ht 5\' 7"  (1.702 m)   Wt 196 lb (88.9 kg)   SpO2 98%   BMI 30.70 kg/m   Opioid Risk Score:   Fall Risk Score:  `1  Depression screen PHQ 2/9  Depression screen [facility] 2/9 11/06/2019 10/09/2019 09/11/2019 08/12/2019 03/04/2019 01/05/2019 09/10/2018  Decreased Interest 0 0 0 0 0 0 0  Down, Depressed, Hopeless 0 0 0 0 0 0 0  PHQ - 2 Score 0 0 0 0 0 0 0    Review of Systems  Musculoskeletal: Positive for gait problem.  Neurological: Positive for weakness.  All other systems reviewed and are negative.      Objective:   Physical Exam Vitals and nursing note reviewed.  Constitutional:      Appearance: Normal appearance.  Cardiovascular:     Rate and Rhythm: Normal rate and regular rhythm.     Pulses: Normal pulses.     Heart sounds: Normal heart sounds.  Pulmonary:     Effort: Pulmonary effort is normal.     Breath sounds: Normal breath sounds.  Musculoskeletal:     Cervical back: Normal range of motion and neck supple.     Comments: Normal Muscle Bulk and Muscle Testing Reveals:  Upper Extremities: Full ROM and Muscle Strength 5/5  Lumbar Paraspinal Tenderness: L-4-L-5 Lower Extremities: Full ROM and Muscle Strength 5/5 Arises from chair slowly using cane for support Wide Based Gait   Skin:    General: Skin is warm and dry.  Neurological:     Mental Status: He is alert and oriented to person, place, and time.  Psychiatric:        Mood and Affect: Mood normal.        Behavior: Behavior normal.           Assessment & Plan:  1. History of multiple strokes with gait disorder, ongoing ataxia andspasticleft hemiparesis: ContinueHEP as Tolerated.11/06/2019 2. Chronic low back pain with documented facet arthropathy and history of intradural defect/schwanomma requiring resection, per Dr. 11/08/2019 Note. Continue current medication regimen. Refilled:Percocet10/325 one q6 prn #120.11/06/2019 We will continue the opioid monitoring  program, this consists of regular clinic visits, examinations, routine drug screening, pill counts as well as use of 11/08/2019 Controlled Substance Reporting System. [place].  F/U in 1 month  [place] of face to face patient care time was spent during this visit. All questions were encouraged and answered.

## 2019-12-04 ENCOUNTER — Encounter: Payer: Medicaid Other | Attending: Physical Medicine & Rehabilitation | Admitting: Registered Nurse

## 2019-12-04 ENCOUNTER — Telehealth: Payer: Self-pay

## 2019-12-04 ENCOUNTER — Encounter: Payer: Self-pay | Admitting: Registered Nurse

## 2019-12-04 ENCOUNTER — Other Ambulatory Visit: Payer: Self-pay

## 2019-12-04 VITALS — BP 123/86 | HR 72 | Temp 98.7°F | Ht 67.0 in | Wt 199.0 lb

## 2019-12-04 DIAGNOSIS — M47816 Spondylosis without myelopathy or radiculopathy, lumbar region: Secondary | ICD-10-CM | POA: Insufficient documentation

## 2019-12-04 DIAGNOSIS — Z760 Encounter for issue of repeat prescription: Secondary | ICD-10-CM | POA: Diagnosis not present

## 2019-12-04 DIAGNOSIS — Z79891 Long term (current) use of opiate analgesic: Secondary | ICD-10-CM | POA: Diagnosis not present

## 2019-12-04 DIAGNOSIS — I252 Old myocardial infarction: Secondary | ICD-10-CM | POA: Diagnosis not present

## 2019-12-04 DIAGNOSIS — G894 Chronic pain syndrome: Secondary | ICD-10-CM | POA: Insufficient documentation

## 2019-12-04 DIAGNOSIS — M545 Low back pain: Secondary | ICD-10-CM | POA: Diagnosis not present

## 2019-12-04 DIAGNOSIS — G8929 Other chronic pain: Secondary | ICD-10-CM | POA: Diagnosis present

## 2019-12-04 DIAGNOSIS — R269 Unspecified abnormalities of gait and mobility: Secondary | ICD-10-CM | POA: Diagnosis not present

## 2019-12-04 DIAGNOSIS — Z955 Presence of coronary angioplasty implant and graft: Secondary | ICD-10-CM | POA: Diagnosis not present

## 2019-12-04 DIAGNOSIS — I10 Essential (primary) hypertension: Secondary | ICD-10-CM | POA: Insufficient documentation

## 2019-12-04 DIAGNOSIS — R262 Difficulty in walking, not elsewhere classified: Secondary | ICD-10-CM | POA: Diagnosis not present

## 2019-12-04 DIAGNOSIS — Z5181 Encounter for therapeutic drug level monitoring: Secondary | ICD-10-CM | POA: Diagnosis not present

## 2019-12-04 DIAGNOSIS — G8114 Spastic hemiplegia affecting left nondominant side: Secondary | ICD-10-CM | POA: Diagnosis not present

## 2019-12-04 DIAGNOSIS — Z8673 Personal history of transient ischemic attack (TIA), and cerebral infarction without residual deficits: Secondary | ICD-10-CM | POA: Diagnosis not present

## 2019-12-04 MED ORDER — OXYCODONE-ACETAMINOPHEN 10-325 MG PO TABS: 1.0000 | ORAL_TABLET | Freq: Four times a day (QID) | ORAL | 0 refills | Status: DC | PRN

## 2019-12-04 NOTE — Telephone Encounter (Signed)
Patient called stating he needs a PA done for Oxycodone/APAP

## 2019-12-04 NOTE — Progress Notes (Signed)
Subjective:    Patient ID: George Villa, male    DOB: Aug 16, 1965, 54 y.o.   MRN: [id number]  HPI: George Villa is a 54 y.o. male who returns for follow up appointment for chronic pain and medication refill. He states his pain is located in his lower back. He rates his pain 7. His current exercise regime is walking and riding his stationary bicycle for 30 minutes 5 days a week.  [name] Morphine equivalent is 60.00 MME.    Last UDS was Performed on 09/11/2019, see note for details.    Pain Inventory Average Pain 7 Pain Right Now 7 My pain is dull  In the last 24 hours, has pain interfered with the following? General activity 7 Relation with others 7 Enjoyment of life 0 What TIME of day is your pain at its worst? night Sleep (in general) Fair  Pain is worse with: bending and standing Pain improves with: medication Relief from Meds: 8  Family History  Problem Relation Age of Onset  . Heart Problems Father   . Hypertension Mother   . Stroke Other    Social History   Socioeconomic History  . Marital status: Single    Spouse name: Not on file  . Number of children: Not on file  . Years of education: Not on file  . Highest education level: Not on file  Occupational History  . Occupation: umemployed     Comment: Works in [employer]  . Smoking status: Never Smoker  . Smokeless tobacco: Never Used  Substance and Sexual Activity  . Alcohol use: Yes    Alcohol/week: 6.0 standard drinks    Types: 6 Cans of beer per week    Comment: OCCASSIONALLY   . Drug use: No  . Sexual activity: Yes  Other Topics Concern  . Not on file  Social History Narrative   7 cups of caffeine a week    Social Determinants of Health   Financial Resource Strain:   . Difficulty of Paying Living Expenses: Not on file  Food Insecurity:   . Worried About Programme researcher, broadcasting/film/video in the Last Year: Not on file  . Ran Out of Food in the Last Year: Not on file    Transportation Needs:   . Lack of Transportation (Medical): Not on file  . Lack of Transportation (Non-Medical): Not on file  Physical Activity:   . Days of Exercise per Week: Not on file  . Minutes of Exercise per Session: Not on file  Stress:   . Feeling of Stress : Not on file  Social Connections:   . Frequency of Communication with Friends and Family: Not on file  . Frequency of Social Gatherings with Friends and Family: Not on file  . Attends Religious Services: Not on file  . Active Member of Clubs or Organizations: Not on file  . Attends Banker Meetings: Not on file  . Marital Status: Not on file   Past Surgical History:  Procedure Laterality Date  . BACK SURGERY    . BRAIN HEMATOMA EVACUATION  2006  . LEFT HEART CATHETERIZATION WITH CORONARY ANGIOGRAM N/A 02/17/2014   Procedure: LEFT HEART CATHETERIZATION WITH CORONARY ANGIOGRAM;  Surgeon: [name], MD;  Location: [facility] CATH LAB;  Service: Cardiovascular;  Laterality: N/A;  . LUMBAR DISC SURGERY  2013  . PERCUTANEOUS CORONARY STENT INTERVENTION (PCI-S)  02/17/2014   Procedure: PERCUTANEOUS CORONARY STENT INTERVENTION (PCI-S);  Surgeon: Barry Dienes  [name], MD;  Location: [facility] CATH LAB;  Service: Cardiovascular;;  . TRACHEOSTOMY  05/2004  . VENTRICULOSTOMY  2006   Past Surgical History:  Procedure Laterality Date  . BACK SURGERY    . BRAIN HEMATOMA EVACUATION  2006  . LEFT HEART CATHETERIZATION WITH CORONARY ANGIOGRAM N/A 02/17/2014   Procedure: LEFT HEART CATHETERIZATION WITH CORONARY ANGIOGRAM;  Surgeon: [name], MD;  Location: [facility] CATH LAB;  Service: Cardiovascular;  Laterality: N/A;  . LUMBAR DISC SURGERY  2013  . PERCUTANEOUS CORONARY STENT INTERVENTION (PCI-S)  02/17/2014   Procedure: PERCUTANEOUS CORONARY STENT INTERVENTION (PCI-S);  Surgeon: [name], MD;  Location: [facility] CATH LAB;  Service: Cardiovascular;;  . TRACHEOSTOMY  05/2004  . VENTRICULOSTOMY  2006   Past Medical History:   Diagnosis Date  . Acid reflux   . 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  . Chronic lower back pain   . Headache    "monthly" (02/18/2014)  . Hypertension   . 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 Stroke [facility]) 2006   "left leg weak since" (02/18/2014)   BP 123/86   Pulse 72   Temp 98.7 F (37.1 C)   Ht 5\' 7"  (1.702 m)   Wt 199 lb (90.3 kg)   SpO2 98%   BMI 31.17 kg/m   Opioid Risk Score:   Fall Risk Score:  `1  Depression screen PHQ 2/9  Depression screen [facility] 2/9 11/06/2019 10/09/2019 09/11/2019 08/12/2019 03/04/2019 01/05/2019 09/10/2018  Decreased Interest 0 0 0 0 0 0 0  Down, Depressed, Hopeless 0 0 0 0 0 0 0  PHQ - 2 Score 0 0 0 0 0 0 0    Review of Systems  Constitutional: Negative.   HENT: Negative.   Eyes: Negative.   Respiratory: Negative.   Cardiovascular: Negative.   Gastrointestinal: Negative.   Endocrine: Negative.   Genitourinary: Negative.   Musculoskeletal: Positive for back pain.  Skin: Negative.   Allergic/Immunologic: Negative.   Neurological: Negative.   Hematological: Negative.   Psychiatric/Behavioral: Negative.   All other systems reviewed and are negative.      Objective:   Physical Exam Vitals and nursing note reviewed.  Constitutional:      Appearance: Normal appearance.  Cardiovascular:     Rate and Rhythm: Normal rate and regular rhythm.     Pulses: Normal pulses.     Heart sounds: Normal heart sounds.  Pulmonary:     Effort: Pulmonary effort is normal.     Breath sounds: Normal breath sounds.  Musculoskeletal:     Cervical back: Normal range of motion and neck supple.     Comments: Normal Muscle Bulk and Muscle Testing Reveals:  Upper Extremities: Full ROM and Muscle Strength 5/5  Lumbar Paraspinal Tenderness: L-4-L-5 Lower Extremities: Full ROM and Muscle Strength 5/5 Arises from chair Slowly using cane for support Wide Based  Gait   Skin:     General: Skin is warm and dry.  Neurological:     Mental Status: He is alert and oriented to person, place, and time.  Psychiatric:        Mood and Affect: Mood normal.        Behavior: Behavior normal.           Assessment & Plan:  1. History of multiple strokes with gait disorder, ongoing ataxia andspasticleft hemiparesis: ContinueHEP as Tolerated.12/04/2019 2. Chronic low back pain with documented  facet arthropathy and history of intradural defect/schwanomma requiring resection, per Dr. Riley Kill Note. Continue current medication regimen. Refilled:Percocet10/325 one q6 prn #120.12/04/2019 We will continue the opioid monitoring program, this consists of regular clinic visits, examinations, urine drug screen, pill counts as well as use of [place] Controlled Substance Reporting system. A 12 month History has been reviewed on the [place] Controlled Substance Reporting System on 12/04/2019 F/U in 1 month  of face to face patient care time was spent during this visit. All questions were encouraged and answered.

## 2019-12-07 NOTE — Telephone Encounter (Signed)
PA submitted, awaiting response

## 2019-12-07 NOTE — Telephone Encounter (Signed)
PA states that it is not necessary at this point.

## 2019-12-10 DIAGNOSIS — Z131 Encounter for screening for diabetes mellitus: Secondary | ICD-10-CM | POA: Diagnosis not present

## 2019-12-10 DIAGNOSIS — D539 Nutritional anemia, unspecified: Secondary | ICD-10-CM | POA: Diagnosis not present

## 2019-12-10 DIAGNOSIS — R5383 Other fatigue: Secondary | ICD-10-CM | POA: Diagnosis not present

## 2019-12-10 DIAGNOSIS — R0602 Shortness of breath: Secondary | ICD-10-CM | POA: Diagnosis not present

## 2019-12-10 DIAGNOSIS — Z136 Encounter for screening for cardiovascular disorders: Secondary | ICD-10-CM | POA: Diagnosis not present

## 2019-12-10 DIAGNOSIS — Z1159 Encounter for screening for other viral diseases: Secondary | ICD-10-CM | POA: Diagnosis not present

## 2019-12-10 DIAGNOSIS — Z13220 Encounter for screening for lipoid disorders: Secondary | ICD-10-CM | POA: Diagnosis not present

## 2019-12-10 DIAGNOSIS — Z125 Encounter for screening for malignant neoplasm of prostate: Secondary | ICD-10-CM | POA: Diagnosis not present

## 2019-12-10 DIAGNOSIS — Z0000 Encounter for general adult medical examination without abnormal findings: Secondary | ICD-10-CM | POA: Diagnosis not present

## 2019-12-10 DIAGNOSIS — Z20822 Contact with and (suspected) exposure to covid-19: Secondary | ICD-10-CM | POA: Diagnosis not present

## 2019-12-10 DIAGNOSIS — Z1331 Encounter for screening for depression: Secondary | ICD-10-CM | POA: Diagnosis not present

## 2019-12-10 DIAGNOSIS — E559 Vitamin D deficiency, unspecified: Secondary | ICD-10-CM | POA: Diagnosis not present

## 2019-12-10 DIAGNOSIS — Z114 Encounter for screening for human immunodeficiency virus [HIV]: Secondary | ICD-10-CM | POA: Diagnosis not present

## 2019-12-10 DIAGNOSIS — Z1339 Encounter for screening examination for other mental health and behavioral disorders: Secondary | ICD-10-CM | POA: Diagnosis not present

## 2019-12-11 ENCOUNTER — Ambulatory Visit: Payer: Medicaid Other | Admitting: Registered Nurse

## 2020-01-01 ENCOUNTER — Encounter: Payer: Medicaid Other | Attending: Physical Medicine & Rehabilitation | Admitting: Registered Nurse

## 2020-01-01 ENCOUNTER — Other Ambulatory Visit: Payer: Self-pay

## 2020-01-01 ENCOUNTER — Encounter: Payer: Self-pay | Admitting: Registered Nurse

## 2020-01-01 VITALS — BP 142/93 | HR 73 | Temp 98.4°F | Ht 67.0 in | Wt 199.0 lb

## 2020-01-01 DIAGNOSIS — Z8673 Personal history of transient ischemic attack (TIA), and cerebral infarction without residual deficits: Secondary | ICD-10-CM | POA: Insufficient documentation

## 2020-01-01 DIAGNOSIS — G894 Chronic pain syndrome: Secondary | ICD-10-CM | POA: Diagnosis not present

## 2020-01-01 DIAGNOSIS — I252 Old myocardial infarction: Secondary | ICD-10-CM | POA: Insufficient documentation

## 2020-01-01 DIAGNOSIS — I10 Essential (primary) hypertension: Secondary | ICD-10-CM | POA: Diagnosis not present

## 2020-01-01 DIAGNOSIS — M545 Low back pain: Secondary | ICD-10-CM | POA: Diagnosis not present

## 2020-01-01 DIAGNOSIS — R262 Difficulty in walking, not elsewhere classified: Secondary | ICD-10-CM | POA: Insufficient documentation

## 2020-01-01 DIAGNOSIS — Z5181 Encounter for therapeutic drug level monitoring: Secondary | ICD-10-CM | POA: Diagnosis not present

## 2020-01-01 DIAGNOSIS — Z760 Encounter for issue of repeat prescription: Secondary | ICD-10-CM | POA: Insufficient documentation

## 2020-01-01 DIAGNOSIS — Z79891 Long term (current) use of opiate analgesic: Secondary | ICD-10-CM

## 2020-01-01 DIAGNOSIS — Z955 Presence of coronary angioplasty implant and graft: Secondary | ICD-10-CM | POA: Diagnosis not present

## 2020-01-01 DIAGNOSIS — M47816 Spondylosis without myelopathy or radiculopathy, lumbar region: Secondary | ICD-10-CM | POA: Diagnosis not present

## 2020-01-01 DIAGNOSIS — R269 Unspecified abnormalities of gait and mobility: Secondary | ICD-10-CM | POA: Insufficient documentation

## 2020-01-01 DIAGNOSIS — G8114 Spastic hemiplegia affecting left nondominant side: Secondary | ICD-10-CM | POA: Diagnosis not present

## 2020-01-01 DIAGNOSIS — G8929 Other chronic pain: Secondary | ICD-10-CM | POA: Insufficient documentation

## 2020-01-01 MED ORDER — OXYCODONE-ACETAMINOPHEN 10-325 MG PO TABS: 1.0000 | ORAL_TABLET | Freq: Four times a day (QID) | ORAL | 0 refills | Status: DC | PRN

## 2020-01-01 NOTE — Progress Notes (Signed)
Subjective:    Patient ID: George Villa, male    DOB: 03/13/1966, 54 y.o.   MRN: [id number]  HPI: George Villa is a 53 y.o. male who returns for follow up appointment for chronic pain and medication refill. He states his pain is located in his lower back. He rates his pain 8. His current exercise regime is walking short distances with his cane and riding his stationary bicycle for 30 minutes daily.   [name] Morphine equivalent is 60.00 MME. UDS ordered today.     Pain Inventory Average Pain 8 Pain Right Now 8 My pain is dull  In the last 24 hours, has pain interfered with the following? General activity 7 Relation with others 7 Enjoyment of life 7 What TIME of day is your pain at its worst? varies Sleep (in general) Fair  Pain is worse with: walking, bending and standing Pain improves with: medication Relief from Meds: 9  Family History  Problem Relation Age of Onset  . Heart Problems Father   . Hypertension Mother   . Stroke Other    Social History   Socioeconomic History  . Marital status: Single    Spouse name: Not on file  . Number of children: Not on file  . Years of education: Not on file  . Highest education level: Not on file  Occupational History  . Occupation: umemployed     Comment: Works in [employer]  . Smoking status: Never Smoker  . Smokeless tobacco: Never Used  Substance and Sexual Activity  . Alcohol use: Yes    Alcohol/week: 6.0 standard drinks    Types: 6 Cans of beer per week    Comment: OCCASSIONALLY   . Drug use: No  . Sexual activity: Yes  Other Topics Concern  . Not on file  Social History Narrative   7 cups of caffeine a week    Social Determinants of Health   Financial Resource Strain:   . Difficulty of Paying Living Expenses: Not on file  Food Insecurity:   . Worried About Programme researcher, broadcasting/film/video in the Last Year: Not on file  . Ran Out of Food in the Last Year: Not on file  Transportation Needs:     . Lack of Transportation (Medical): Not on file  . Lack of Transportation (Non-Medical): Not on file  Physical Activity:   . Days of Exercise per Week: Not on file  . Minutes of Exercise per Session: Not on file  Stress:   . Feeling of Stress : Not on file  Social Connections:   . Frequency of Communication with Friends and Family: Not on file  . Frequency of Social Gatherings with Friends and Family: Not on file  . Attends Religious Services: Not on file  . Active Member of Clubs or Organizations: Not on file  . Attends Banker Meetings: Not on file  . Marital Status: Not on file   Past Surgical History:  Procedure Laterality Date  . BACK SURGERY    . BRAIN HEMATOMA EVACUATION  2006  . LEFT HEART CATHETERIZATION WITH CORONARY ANGIOGRAM N/A 02/17/2014   Procedure: LEFT HEART CATHETERIZATION WITH CORONARY ANGIOGRAM;  Surgeon: [name], MD;  Location: [facility] CATH LAB;  Service: Cardiovascular;  Laterality: N/A;  . LUMBAR DISC SURGERY  2013  . PERCUTANEOUS CORONARY STENT INTERVENTION (PCI-S)  02/17/2014   Procedure: PERCUTANEOUS CORONARY STENT INTERVENTION (PCI-S);  Surgeon: [name], MD;  Location:  [facility] CATH LAB;  Service: Cardiovascular;;  . TRACHEOSTOMY  05/2004  . VENTRICULOSTOMY  2006   Past Surgical History:  Procedure Laterality Date  . BACK SURGERY    . BRAIN HEMATOMA EVACUATION  2006  . LEFT HEART CATHETERIZATION WITH CORONARY ANGIOGRAM N/A 02/17/2014   Procedure: LEFT HEART CATHETERIZATION WITH CORONARY ANGIOGRAM;  Surgeon: [name], MD;  Location: [facility] CATH LAB;  Service: Cardiovascular;  Laterality: N/A;  . LUMBAR DISC SURGERY  2013  . PERCUTANEOUS CORONARY STENT INTERVENTION (PCI-S)  02/17/2014   Procedure: PERCUTANEOUS CORONARY STENT INTERVENTION (PCI-S);  Surgeon: [name], MD;  Location: [facility] CATH LAB;  Service: Cardiovascular;;  . TRACHEOSTOMY  05/2004  . VENTRICULOSTOMY  2006   Past Medical History:  Diagnosis Date  . Acid  reflux   . 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  . Chronic lower back pain   . Headache    "monthly" (02/18/2014)  . Hypertension   . 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 Stroke [facility]) 2006   "left leg weak since" (02/18/2014)   BP (!) 142/93 (BP Location: Left Arm, Patient Position: Sitting, Cuff Size: Normal)   Pulse 73   Temp 98.4 F (36.9 C)   Ht 5\' 7"  (1.702 m)   Wt 199 lb (90.3 kg)   SpO2 97%   BMI 31.17 kg/m   Opioid Risk Score:   Fall Risk Score:  `1  Depression screen PHQ 2/9  Depression screen [facility] 2/9 01/01/2020 11/06/2019 10/09/2019 09/11/2019 08/12/2019 03/04/2019 01/05/2019  Decreased Interest 0 0 0 0 0 0 0  Down, Depressed, Hopeless 0 0 0 0 0 0 0  PHQ - 2 Score 0 0 0 0 0 0 0     Review of Systems  Constitutional: Negative.   HENT: Negative.   Eyes: Negative.   Respiratory: Negative.   Cardiovascular: Negative.   Gastrointestinal: Negative.   Endocrine: Negative.   Genitourinary: Negative.   Musculoskeletal: Positive for back pain.  Skin: Negative.   Allergic/Immunologic: Negative.   Neurological: Negative.   Hematological: Negative.   Psychiatric/Behavioral: Negative.   All other systems reviewed and are negative.      Objective:   Physical Exam Constitutional:      Appearance: Normal appearance.  Cardiovascular:     Rate and Rhythm: Normal rate and regular rhythm.     Pulses: Normal pulses.     Heart sounds: Normal heart sounds.  Pulmonary:     Effort: Pulmonary effort is normal.     Breath sounds: Normal breath sounds.  Musculoskeletal:     Cervical back: Normal range of motion and neck supple.     Comments: Normal Muscle Bulk and Muscle Testing Reveals:  Upper Extremities: Full ROM and Muscle Strength 5/5  Lumbar Paraspinal Tenderness: L-4-L-5 Lower Extremities: Full ROM and Muscle Strength 5/5 Arises from chair with ease Wide  Based Gait   Skin:     General: Skin is warm and dry.  Neurological:     Mental Status: He is alert and oriented to person, place, and time.  Psychiatric:        Mood and Affect: Mood normal.        Behavior: Behavior normal.           Assessment & Plan:  1. History of multiple strokes with gait disorder, ongoing ataxia andspasticleft hemiparesis: ContinueHEP as Tolerated.01/01/2020 2. Chronic low back pain with documented facet arthropathy  and history of intradural defect/schwanomma requiring resection, per Dr. Riley Kill Note. Continue current medication regimen. Refilled:Percocet10/325 one q6 prn #120.01/01/2020 We will continue the opioid monitoring program, this consists of regular clinic visits, examinations, urine drug screen, pill counts as well as use of [place] Controlled Substance Reporting system. A 12 month History has been reviewed on the [place] Controlled Substance Reporting System on 01/01/2020  F/U in 1 month  of face to face patient care time was spent during this visit. All questions were encouraged and answered.

## 2020-01-06 LAB — TOXASSURE SELECT,+ANTIDEPR,UR

## 2020-01-08 ENCOUNTER — Telehealth: Payer: Self-pay | Admitting: *Deleted

## 2020-01-08 NOTE — Telephone Encounter (Signed)
Urine drug screen for this encounter is consistent for prescribed medication 

## 2020-01-21 DIAGNOSIS — E789 Disorder of lipoprotein metabolism, unspecified: Secondary | ICD-10-CM | POA: Diagnosis not present

## 2020-01-21 DIAGNOSIS — I639 Cerebral infarction, unspecified: Secondary | ICD-10-CM | POA: Diagnosis not present

## 2020-01-21 DIAGNOSIS — I10 Essential (primary) hypertension: Secondary | ICD-10-CM | POA: Diagnosis not present

## 2020-01-21 DIAGNOSIS — K219 Gastro-esophageal reflux disease without esophagitis: Secondary | ICD-10-CM | POA: Diagnosis not present

## 2020-01-29 ENCOUNTER — Encounter: Payer: Self-pay | Admitting: Registered Nurse

## 2020-01-29 ENCOUNTER — Encounter: Payer: Medicaid Other | Attending: Physical Medicine & Rehabilitation | Admitting: Registered Nurse

## 2020-01-29 ENCOUNTER — Other Ambulatory Visit: Payer: Self-pay

## 2020-01-29 VITALS — BP 146/96 | HR 75 | Temp 98.0°F | Ht 67.0 in | Wt 200.6 lb

## 2020-01-29 DIAGNOSIS — G894 Chronic pain syndrome: Secondary | ICD-10-CM | POA: Insufficient documentation

## 2020-01-29 DIAGNOSIS — G8114 Spastic hemiplegia affecting left nondominant side: Secondary | ICD-10-CM | POA: Diagnosis not present

## 2020-01-29 DIAGNOSIS — Z79891 Long term (current) use of opiate analgesic: Secondary | ICD-10-CM | POA: Insufficient documentation

## 2020-01-29 DIAGNOSIS — Z5181 Encounter for therapeutic drug level monitoring: Secondary | ICD-10-CM | POA: Diagnosis not present

## 2020-01-29 DIAGNOSIS — M47816 Spondylosis without myelopathy or radiculopathy, lumbar region: Secondary | ICD-10-CM | POA: Diagnosis not present

## 2020-01-29 DIAGNOSIS — R269 Unspecified abnormalities of gait and mobility: Secondary | ICD-10-CM | POA: Diagnosis not present

## 2020-01-29 MED ORDER — OXYCODONE-ACETAMINOPHEN 10-325 MG PO TABS: 1.0000 | ORAL_TABLET | Freq: Four times a day (QID) | ORAL | 0 refills | Status: DC | PRN

## 2020-01-29 NOTE — Progress Notes (Signed)
Subjective:    Patient ID: George Villa, male    DOB: 04-Aug-1965, 54 y.o.   MRN: [id number]  HPI: George Villa is a 54 y.o. male who returns for follow up appointment for chronic pain and medication refill. He states his pain is located in his lower back pain. He rates his pain 8. His current exercise regime is walking and riding his stationary bicycle 5 days a week for 30 minutes.   George Villa Morphine equivalent is 60.00 MME.    Last UDS was Performed on 01/01/2020, it was consistent.  Pain Inventory Average Pain 8 Pain Right Now 8 My pain is dull  In the last 24 hours, has pain interfered with the following? General activity 7 Relation with others 7 Enjoyment of life 7 What TIME of day is your pain at its worst? night Sleep (in general) Fair  Pain is worse with: walking and bending Pain improves with: medication Relief from Meds: 9  Family History  Problem Relation Age of Onset   Heart Problems Father    Hypertension Mother    Stroke Other    Social History   Socioeconomic History   Marital status: Single    Spouse name: Not on file   Number of children: Not on file   Years of education: Not on file   Highest education level: Not on file  Occupational History   Occupation: umemployed     Comment: Works in [employer]   Tobacco Use   Smoking status: Never Smoker   Smokeless tobacco: Never Used  Substance and Sexual Activity   Alcohol use: Yes    Alcohol/week: 6.0 standard drinks    Types: 6 Cans of beer per week    Comment: OCCASSIONALLY    Drug use: No   Sexual activity: Yes  Other Topics Concern   Not on file  Social History Narrative   7 cups of caffeine a week    Social Determinants of Health   Financial Resource Strain:    Difficulty of Paying Living Expenses: Not on file  Food Insecurity:    Worried About George Villa in the Last Year: Not on file   [employer] of Food in the Last Year: Not on file  Transportation  Needs:    Lack of Transportation (Medical): Not on file   Lack of Transportation (Non-Medical): Not on file  Physical Activity:    Days of Exercise per Week: Not on file   Minutes of Exercise per Session: Not on file  Stress:    Feeling of Stress : Not on file  Social Connections:    Frequency of Communication with Friends and Family: Not on file   Frequency of Social Gatherings with Friends and Family: Not on file   Attends Religious Services: Not on file   Active Member of Clubs or Organizations: Not on file   Attends Banker Meetings: Not on file   Marital Status: Not on file   Past Surgical History:  Procedure Laterality Date   BACK SURGERY     BRAIN HEMATOMA EVACUATION  2006   LEFT HEART CATHETERIZATION WITH CORONARY ANGIOGRAM N/A 02/17/2014   Procedure: LEFT HEART CATHETERIZATION WITH CORONARY ANGIOGRAM;  Surgeon: George Noe, MD;  Location: [facility] CATH LAB;  Service: Cardiovascular;  Laterality: N/A;   LUMBAR DISC SURGERY  2013   PERCUTANEOUS CORONARY STENT INTERVENTION (PCI-S)  02/17/2014   Procedure: PERCUTANEOUS CORONARY STENT INTERVENTION (PCI-S);  Surgeon: George Records III,  MD;  Location: [facility] CATH LAB;  Service: Cardiovascular;;   TRACHEOSTOMY  05/2004   VENTRICULOSTOMY  2006   Past Surgical History:  Procedure Laterality Date   BACK SURGERY     BRAIN HEMATOMA EVACUATION  2006   LEFT HEART CATHETERIZATION WITH CORONARY ANGIOGRAM N/A 02/17/2014   Procedure: LEFT HEART CATHETERIZATION WITH CORONARY ANGIOGRAM;  Surgeon: George Noe, MD;  Location: [facility] CATH LAB;  Service: Cardiovascular;  Laterality: N/A;   LUMBAR DISC SURGERY  2013   PERCUTANEOUS CORONARY STENT INTERVENTION (PCI-S)  02/17/2014   Procedure: PERCUTANEOUS CORONARY STENT INTERVENTION (PCI-S);  Surgeon: George Noe, MD;  Location: [facility] C[facility] LAB;  Service: Cardiovascular;;   TRACHEOSTOMY  05/2004   VENTRICULOSTOMY  2006   Past Medical History:  Diagnosis Date     Acid reflux    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   George Villa 02/17/2014   Stroke (HCC) 2006   "left leg weak since" (02/18/2014)   BP (!) 146/96    Pulse 75    Temp 98 F (36.7 C)    Ht 5\' 7"  (1.702 m)    Wt 200 lb 9.6 oz (91 kg)    SpO2 95%    BMI 31.42 kg/m   Opioid Risk Score:   Fall Risk Score:  `1  Depression screen PHQ 2/9  Depression screen [facility] 2/9 01/01/2020 11/06/2019 10/09/2019 09/11/2019 08/12/2019 03/04/2019 01/05/2019  Decreased Interest 0 0 0 0 0 0 0  Down, Depressed, Hopeless 0 0 0 0 0 0 0  PHQ - 2 Score 0 0 0 0 0 0 0     Review of Systems     Objective:   Physical Exam Vitals and nursing note reviewed.  Constitutional:      Appearance: Normal appearance.  Cardiovascular:     Rate and Rhythm: Normal rate and regular rhythm.     Pulses: Normal pulses.     Heart sounds: Normal heart sounds.  Pulmonary:     Effort: Pulmonary effort is normal.     Breath sounds: Normal breath sounds.  Musculoskeletal:     Cervical back: Normal range of motion and neck supple.     Comments: Normal Muscle Bulk and Muscle Testing Reveals:  Upper Extremities: Full ROM and Muscle Strength 5/5  Lumbar Paraspinal Tenderness: L-3-L-5 Lower Extremities: Full ROM and Muscle Strength 5/5 Arises from chair slowly using cane for support Antalgic Gait   Skin:    General: Skin is warm and dry.  Neurological:     Mental Status: He is alert and oriented to person, place, and time.  Psychiatric:        Mood and Affect: Mood normal.        Behavior: Behavior normal.           Assessment & Plan:  1. History of multiple strokes with gait disorder, ongoing ataxia andspasticleft hemiparesis: ContinueHEP as Tolerated.01/29/2020 2. Chronic low back pain with documented facet arthropathy and history  of intradural defect/schwanomma requiring resection, per Dr. 01/31/2020 Note. Continue current medication regimen. Refilled:Percocet10/325 one q6 prn #120.01/29/2020 We will continue the opioid monitoring program, this consists of regular clinic visits, examinations, urine drug screen, pill counts as well as use of 02-17-1998 Controlled Substance Reporting system. A 12 month History has been reviewed on  the [place] Controlled Substance Reporting Systemon 01/29/2020  F/U in 1 month  of face to face patient care time was spent during this visit. All questions were encouraged and answered.

## 2020-02-26 ENCOUNTER — Encounter: Payer: Self-pay | Admitting: Registered Nurse

## 2020-02-26 ENCOUNTER — Encounter: Payer: Medicaid Other | Attending: Physical Medicine & Rehabilitation | Admitting: Registered Nurse

## 2020-02-26 ENCOUNTER — Other Ambulatory Visit: Payer: Self-pay

## 2020-02-26 VITALS — BP 116/81 | HR 76 | Temp 98.3°F | Ht 67.0 in | Wt 201.0 lb

## 2020-02-26 DIAGNOSIS — G894 Chronic pain syndrome: Secondary | ICD-10-CM | POA: Insufficient documentation

## 2020-02-26 DIAGNOSIS — Z5181 Encounter for therapeutic drug level monitoring: Secondary | ICD-10-CM | POA: Insufficient documentation

## 2020-02-26 DIAGNOSIS — R269 Unspecified abnormalities of gait and mobility: Secondary | ICD-10-CM | POA: Diagnosis not present

## 2020-02-26 DIAGNOSIS — Z79891 Long term (current) use of opiate analgesic: Secondary | ICD-10-CM | POA: Diagnosis not present

## 2020-02-26 DIAGNOSIS — M47816 Spondylosis without myelopathy or radiculopathy, lumbar region: Secondary | ICD-10-CM | POA: Insufficient documentation

## 2020-02-26 MED ORDER — OXYCODONE-ACETAMINOPHEN 10-325 MG PO TABS: 1.0000 | ORAL_TABLET | Freq: Four times a day (QID) | ORAL | 0 refills | Status: DC | PRN

## 2020-02-26 NOTE — Progress Notes (Signed)
Subjective:    Patient ID: George Villa, male    DOB: October 07, 1965, 54 y.o.   MRN: [id number]  HPI: George Villa is a 54 y.o. male who returns for follow up appointment for chronic pain and medication refill. He states his pain is located in his lower back. He rates his pain 8. His current exercise regime is walking, riding his stationary bicycle for 30 minutes daily and performing stretching exercises.  Mr. Kijowski Morphine equivalent is 60.00 MME.    Last UDS was Performed on 01/01/2020, it was consistent.   Pain Inventory Average Pain 8 Pain Right Now 8 My pain is dull  In the last 24 hours, has pain interfered with the following? General activity 7 Relation with others 8 Enjoyment of life 7 What TIME of day is your pain at its worst? night Sleep (in general) Good  Pain is worse with: bending Pain improves with: medication Relief from Meds: 10  Family History  Problem Relation Age of Onset  . Heart Problems Father   . Hypertension Mother   . Stroke Other    Social History   Socioeconomic History  . Marital status: Single    Spouse name: Not on file  . Number of children: Not on file  . Years of education: Not on file  . Highest education level: Not on file  Occupational History  . Occupation: umemployed     Comment: Works in [employer]  . Smoking status: Never Smoker  . Smokeless tobacco: Never Used  Substance and Sexual Activity  . Alcohol use: Yes    Alcohol/week: 6.0 standard drinks    Types: 6 Cans of beer per week    Comment: OCCASSIONALLY   . Drug use: No  . Sexual activity: Yes  Other Topics Concern  . Not on file  Social History Narrative   7 cups of caffeine a week    Social Determinants of Health   Financial Resource Strain:   . Difficulty of Paying Living Expenses: Not on file  Food Insecurity:   . Worried About Programme researcher, broadcasting/film/video in the Last Year: Not on file  . Ran Out of Food in the Last Year: Not on file    Transportation Needs:   . Lack of Transportation (Medical): Not on file  . Lack of Transportation (Non-Medical): Not on file  Physical Activity:   . Days of Exercise per Week: Not on file  . Minutes of Exercise per Session: Not on file  Stress:   . Feeling of Stress : Not on file  Social Connections:   . Frequency of Communication with Friends and Family: Not on file  . Frequency of Social Gatherings with Friends and Family: Not on file  . Attends Religious Services: Not on file  . Active Member of Clubs or Organizations: Not on file  . Attends Banker Meetings: Not on file  . Marital Status: Not on file   Past Surgical History:  Procedure Laterality Date  . BACK SURGERY    . BRAIN HEMATOMA EVACUATION  2006  . LEFT HEART CATHETERIZATION WITH CORONARY ANGIOGRAM N/A 02/17/2014   Procedure: LEFT HEART CATHETERIZATION WITH CORONARY ANGIOGRAM;  Surgeon: [name], MD;  Location: [facility] CATH LAB;  Service: Cardiovascular;  Laterality: N/A;  . LUMBAR DISC SURGERY  2013  . PERCUTANEOUS CORONARY STENT INTERVENTION (PCI-S)  02/17/2014   Procedure: PERCUTANEOUS CORONARY STENT INTERVENTION (PCI-S);  Surgeon: [name], MD;  Location: [facility] CATH LAB;  Service: Cardiovascular;;  . TRACHEOSTOMY  05/2004  . VENTRICULOSTOMY  2006   Past Surgical History:  Procedure Laterality Date  . BACK SURGERY    . BRAIN HEMATOMA EVACUATION  2006  . LEFT HEART CATHETERIZATION WITH CORONARY ANGIOGRAM N/A 02/17/2014   Procedure: LEFT HEART CATHETERIZATION WITH CORONARY ANGIOGRAM;  Surgeon: [name], MD;  Location: [facility] CATH LAB;  Service: Cardiovascular;  Laterality: N/A;  . LUMBAR DISC SURGERY  2013  . PERCUTANEOUS CORONARY STENT INTERVENTION (PCI-S)  02/17/2014   Procedure: PERCUTANEOUS CORONARY STENT INTERVENTION (PCI-S);  Surgeon: [name], MD;  Location: [facility] CATH LAB;  Service: Cardiovascular;;  . TRACHEOSTOMY  05/2004  . VENTRICULOSTOMY  2006   Past Medical History:   Diagnosis Date  . Acid reflux   . 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  . Chronic lower back pain   . Headache    "monthly" (02/18/2014)  . Hypertension   . 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 Stroke [facility]) 2006   "left leg weak since" (02/18/2014)   BP 116/81   Pulse 76   Temp 98.3 F (36.8 C)   Ht 5\' 7"  (1.702 m)   Wt 201 lb (91.2 kg)   SpO2 95%   BMI 31.48 kg/m   Opioid Risk Score:   Fall Risk Score:  `1  Depression screen PHQ 2/9  Depression screen [facility] 2/9 01/01/2020 11/06/2019 10/09/2019 09/11/2019 08/12/2019 03/04/2019 01/05/2019  Decreased Interest 0 0 0 0 0 0 0  Down, Depressed, Hopeless 0 0 0 0 0 0 0  PHQ - 2 Score 0 0 0 0 0 0 0    Review of Systems  Musculoskeletal: Positive for back pain.  All other systems reviewed and are negative.      Objective:   Physical Exam Vitals and nursing note reviewed.  Constitutional:      Appearance: Normal appearance.  Cardiovascular:     Rate and Rhythm: Normal rate and regular rhythm.     Pulses: Normal pulses.     Heart sounds: Normal heart sounds.  Pulmonary:     Effort: Pulmonary effort is normal.     Breath sounds: Normal breath sounds.  Musculoskeletal:     Cervical back: Normal range of motion and neck supple.     Comments: Normal Muscle Bulk and Muscle Testing Reveals:  Upper Extremities: Full ROM and Muscle Strength 5/5  Lumbar Paraspinal Tenderness: L-4-L-5 Lower Extremities: Full ROM and Muscle Strength 5/5 Arises from chair slowly using cane for support Antalgic  Gait   Skin:    General: Skin is warm and dry.  Neurological:     Mental Status: He is alert and oriented to person, place, and time.  Psychiatric:        Mood and Affect: Mood normal.        Behavior: Behavior normal.           Assessment & Plan:  1. History of multiple strokes with gait disorder, ongoing ataxia andspasticleft hemiparesis:  ContinueHEP as Tolerated.02/26/2020 2. Chronic low back pain with documented facet arthropathy and history of intradural defect/schwanomma requiring resection, per Dr. 02/28/2020 Note. Continue current medication regimen. Refilled:Percocet10/325 one q6 prn #120.02/26/2020 We will continue the opioid monitoring program, this consists of regular clinic visits, examinations, urine drug screen, pill counts as well as use of 02/28/2020 Controlled Substance Reporting system. A 12 month History has  been reviewed on the [place] Controlled Substance Reporting Systemon 02/26/2020  F/U in 1 month

## 2020-03-25 ENCOUNTER — Encounter: Payer: Medicaid Other | Attending: Physical Medicine & Rehabilitation | Admitting: Registered Nurse

## 2020-03-25 ENCOUNTER — Encounter: Payer: Self-pay | Admitting: Registered Nurse

## 2020-03-25 ENCOUNTER — Other Ambulatory Visit: Payer: Self-pay

## 2020-03-25 VITALS — BP 125/86 | HR 85 | Temp 98.6°F | Ht 67.0 in | Wt 201.0 lb

## 2020-03-25 DIAGNOSIS — R269 Unspecified abnormalities of gait and mobility: Secondary | ICD-10-CM | POA: Diagnosis not present

## 2020-03-25 DIAGNOSIS — M47816 Spondylosis without myelopathy or radiculopathy, lumbar region: Secondary | ICD-10-CM | POA: Insufficient documentation

## 2020-03-25 DIAGNOSIS — G894 Chronic pain syndrome: Secondary | ICD-10-CM | POA: Diagnosis not present

## 2020-03-25 DIAGNOSIS — Z5181 Encounter for therapeutic drug level monitoring: Secondary | ICD-10-CM | POA: Insufficient documentation

## 2020-03-25 DIAGNOSIS — Z79891 Long term (current) use of opiate analgesic: Secondary | ICD-10-CM | POA: Diagnosis not present

## 2020-03-25 DIAGNOSIS — G8114 Spastic hemiplegia affecting left nondominant side: Secondary | ICD-10-CM | POA: Diagnosis not present

## 2020-03-25 MED ORDER — OXYCODONE-ACETAMINOPHEN 10-325 MG PO TABS: 1.0000 | ORAL_TABLET | Freq: Four times a day (QID) | ORAL | 0 refills | Status: DC | PRN

## 2020-03-25 NOTE — Progress Notes (Signed)
Subjective:    Patient ID: George Villa, male    DOB: 01-25-1966, 54 y.o.   MRN: [id number]  HPI: George Villa is a 54 y.o. male who returns for follow up appointment for chronic pain and medication refill. He states his pain is located in his lower back. He rates his pain 7. His  current exercise regime is walking, riding his stationary bicycle for 30 minutes 5 days a week  and performing stretching exercises.  George Villa Morphine equivalent is 60.00 MME.  Last UDS was Performed on 01/01/2020, it was consistent.    Pain Inventory Average Pain 7 Pain Right Now 7 My pain is dull  In the last 24 hours, has pain interfered with the following? General activity 7 Relation with others 7 Enjoyment of life 7 What TIME of day is your pain at its worst? night Sleep (in general) Poor  Pain is worse with: walking and standing Pain improves with: medication Relief from Meds: 9  Family History  Problem Relation Age of Onset  . Heart Problems Father   . Hypertension Mother   . Stroke Other    Social History   Socioeconomic History  . Marital status: Single    Spouse name: Not on file  . Number of children: Not on file  . Years of education: Not on file  . Highest education level: Not on file  Occupational History  . Occupation: umemployed     Comment: Works in [employer]  . Smoking status: Never Smoker  . Smokeless tobacco: Never Used  Substance and Sexual Activity  . Alcohol use: Yes    Alcohol/week: 6.0 standard drinks    Types: 6 Cans of beer per week    Comment: OCCASSIONALLY   . Drug use: No  . Sexual activity: Yes  Other Topics Concern  . Not on file  Social History Narrative   7 cups of caffeine a week    Social Determinants of Health   Financial Resource Strain: Not on file  Food Insecurity: Not on file  Transportation Needs: Not on file  Physical Activity: Not on file  Stress: Not on file  Social Connections: Not on file   Past  Surgical History:  Procedure Laterality Date  . BACK SURGERY    . BRAIN HEMATOMA EVACUATION  2006  . LEFT HEART CATHETERIZATION WITH CORONARY ANGIOGRAM N/A 02/17/2014   Procedure: LEFT HEART CATHETERIZATION WITH CORONARY ANGIOGRAM;  Surgeon: [name], MD;  Location: [facility] CATH LAB;  Service: Cardiovascular;  Laterality: N/A;  . LUMBAR DISC SURGERY  2013  . PERCUTANEOUS CORONARY STENT INTERVENTION (PCI-S)  02/17/2014   Procedure: PERCUTANEOUS CORONARY STENT INTERVENTION (PCI-S);  Surgeon: [name], MD;  Location: [facility] CATH LAB;  Service: Cardiovascular;;  . TRACHEOSTOMY  05/2004  . VENTRICULOSTOMY  2006   Past Surgical History:  Procedure Laterality Date  . BACK SURGERY    . BRAIN HEMATOMA EVACUATION  2006  . LEFT HEART CATHETERIZATION WITH CORONARY ANGIOGRAM N/A 02/17/2014   Procedure: LEFT HEART CATHETERIZATION WITH CORONARY ANGIOGRAM;  Surgeon: [name], MD;  Location: [facility] CATH LAB;  Service: Cardiovascular;  Laterality: N/A;  . LUMBAR DISC SURGERY  2013  . PERCUTANEOUS CORONARY STENT INTERVENTION (PCI-S)  02/17/2014   Procedure: PERCUTANEOUS CORONARY STENT INTERVENTION (PCI-S);  Surgeon: [name], MD;  Location: [facility] CATH LAB;  Service: Cardiovascular;;  . TRACHEOSTOMY  05/2004  . VENTRICULOSTOMY  2006   Past Medical  History:  Diagnosis Date  . Acid reflux   . 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  . Chronic lower back pain   . Headache    "monthly" (02/18/2014)  . Hypertension   . 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 Stroke [facility]) 2006   "left leg weak since" (02/18/2014)   BP 125/86   Pulse 85   Temp 98.6 F (37 C)   Ht 5\' 7"  (1.702 m)   Wt 201 lb (91.2 kg)   SpO2 96%   BMI 31.48 kg/m   Opioid Risk Score:   Fall Risk Score:  `1  Depression screen PHQ 2/9  Depression screen [facility] 2/9 01/01/2020 11/06/2019 10/09/2019 09/11/2019 08/12/2019 03/04/2019 01/05/2019   Decreased Interest 0 0 0 0 0 0 0  Down, Depressed, Hopeless 0 0 0 0 0 0 0  PHQ - 2 Score 0 0 0 0 0 0 0    Review of Systems  Constitutional: Negative.   HENT: Negative.   Eyes: Negative.   Respiratory: Negative.   Cardiovascular: Negative.   Gastrointestinal: Negative.   Endocrine: Negative.   Genitourinary: Negative.   Musculoskeletal: Positive for back pain and gait problem.  Allergic/Immunologic: Negative.   Hematological: Negative.   Psychiatric/Behavioral: Negative.   All other systems reviewed and are negative.      Objective:   Physical Exam Vitals and nursing note reviewed.  Constitutional:      Appearance: Normal appearance.  Cardiovascular:     Rate and Rhythm: Normal rate and regular rhythm.     Pulses: Normal pulses.     Heart sounds: Normal heart sounds.  Pulmonary:     Effort: Pulmonary effort is normal.     Breath sounds: Normal breath sounds.  Musculoskeletal:     Cervical back: Normal range of motion and neck supple.     Comments: Normal Muscle Bulk and Muscle Testing Reveals:  Upper Extremities: Full ROM and Muscle Strength 5/5  Lumbar Paraspinal Tenderness: L-4-L-5 Lower Extremities: Full ROM and Muscle Strength 5/5 Arises from chair slowly using cane for support Antalgic Gait   Skin:    General: Skin is warm and dry.  Neurological:     Mental Status: He is alert and oriented to person, place, and time.  Psychiatric:        Mood and Affect: Mood normal.        Behavior: Behavior normal.           Assessment & Plan:  1. History of multiple strokes with gait disorder, ongoing ataxia andspasticleft hemiparesis: ContinueHEP as Tolerated.03/25/2020 2. Chronic low back pain with documented facet arthropathy and history of intradural defect/schwanomma requiring resection, per Dr. 03/27/2020 Note. Continue current medication regimen. Refilled:Percocet10/325 one q6 prn #120.03/25/2020 We will continue the opioid monitoring program, this  consists of regular clinic visits, examinations, urine drug screen, pill counts as well as use of 03/27/2020 Controlled Substance Reporting system. A 12 month History has been reviewed on the [place] Controlled Substance Reporting Systemon12/17/2021  F/U in 1 month

## 2020-04-22 ENCOUNTER — Encounter: Payer: Self-pay | Admitting: Registered Nurse

## 2020-04-22 ENCOUNTER — Other Ambulatory Visit: Payer: Self-pay

## 2020-04-22 ENCOUNTER — Encounter: Payer: Medicaid Other | Attending: Physical Medicine & Rehabilitation | Admitting: Registered Nurse

## 2020-04-22 VITALS — BP 129/93 | HR 89 | Temp 99.0°F | Ht 67.0 in | Wt 199.0 lb

## 2020-04-22 DIAGNOSIS — Z79891 Long term (current) use of opiate analgesic: Secondary | ICD-10-CM | POA: Insufficient documentation

## 2020-04-22 DIAGNOSIS — R269 Unspecified abnormalities of gait and mobility: Secondary | ICD-10-CM | POA: Insufficient documentation

## 2020-04-22 DIAGNOSIS — M47816 Spondylosis without myelopathy or radiculopathy, lumbar region: Secondary | ICD-10-CM | POA: Diagnosis not present

## 2020-04-22 DIAGNOSIS — G8114 Spastic hemiplegia affecting left nondominant side: Secondary | ICD-10-CM | POA: Diagnosis not present

## 2020-04-22 DIAGNOSIS — G894 Chronic pain syndrome: Secondary | ICD-10-CM | POA: Diagnosis not present

## 2020-04-22 DIAGNOSIS — Z5181 Encounter for therapeutic drug level monitoring: Secondary | ICD-10-CM | POA: Diagnosis not present

## 2020-04-22 MED ORDER — OXYCODONE-ACETAMINOPHEN 10-325 MG PO TABS: 1.0000 | ORAL_TABLET | Freq: Four times a day (QID) | ORAL | 0 refills | Status: DC | PRN

## 2020-04-22 NOTE — Progress Notes (Signed)
Subjective:    Patient ID: George Villa, male    DOB: 15-Dec-1965, 55 y.o.   MRN: [id number]  HPI: George Villa is a 55 y.o. male who returns for follow up appointment for chronic pain and medication refill. He states his pain is located in his lower back radiating into his left lower extremity occasionally. He rates his pain 7. His  current exercise regime is walking and performing stretching exercises.  [name] reports last week he was walking up the porch steps and lost his footing and landed on his right side. He was able to pick himself up he reports. Right lower quadrant ecchymosis resolving. Educated on falls prevention.   [name] Morphine equivalent is 60.00 MME.  UDS ordered today.    Pain Inventory Average Pain 8 Pain Right Now 7 My pain is dull  In the last 24 hours, has pain interfered with the following? General activity 7 Relation with others 7 Enjoyment of life 7 What TIME of day is your pain at its worst? evening Sleep (in general) Fair  Pain is worse with: walking and bending Pain improves with: medication Relief from Meds: 7  Family History  Problem Relation Age of Onset  . Heart Problems Father   . Hypertension Mother   . Stroke Other    Social History   Socioeconomic History  . Marital status: Single    Spouse name: Not on file  . Number of children: Not on file  . Years of education: Not on file  . Highest education level: Not on file  Occupational History  . Occupation: umemployed     Comment: Works in [employer]  . Smoking status: Never Smoker  . Smokeless tobacco: Never Used  Substance and Sexual Activity  . Alcohol use: Yes    Alcohol/week: 6.0 standard drinks    Types: 6 Cans of beer per week    Comment: OCCASSIONALLY   . Drug use: No  . Sexual activity: Yes  Other Topics Concern  . Not on file  Social History Narrative   7 cups of caffeine a week    Social Determinants of Health   Financial Resource  Strain: Not on file  Food Insecurity: Not on file  Transportation Needs: Not on file  Physical Activity: Not on file  Stress: Not on file  Social Connections: Not on file   Past Surgical History:  Procedure Laterality Date  . BACK SURGERY    . BRAIN HEMATOMA EVACUATION  2006  . LEFT HEART CATHETERIZATION WITH CORONARY ANGIOGRAM N/A 02/17/2014   Procedure: LEFT HEART CATHETERIZATION WITH CORONARY ANGIOGRAM;  Surgeon: [name], MD;  Location: [facility] CATH LAB;  Service: Cardiovascular;  Laterality: N/A;  . LUMBAR DISC SURGERY  2013  . PERCUTANEOUS CORONARY STENT INTERVENTION (PCI-S)  02/17/2014   Procedure: PERCUTANEOUS CORONARY STENT INTERVENTION (PCI-S);  Surgeon: [name], MD;  Location: [facility] CATH LAB;  Service: Cardiovascular;;  . TRACHEOSTOMY  05/2004  . VENTRICULOSTOMY  2006   Past Surgical History:  Procedure Laterality Date  . BACK SURGERY    . BRAIN HEMATOMA EVACUATION  2006  . LEFT HEART CATHETERIZATION WITH CORONARY ANGIOGRAM N/A 02/17/2014   Procedure: LEFT HEART CATHETERIZATION WITH CORONARY ANGIOGRAM;  Surgeon: [name], MD;  Location: [facility] CATH LAB;  Service: Cardiovascular;  Laterality: N/A;  . LUMBAR DISC SURGERY  2013  . PERCUTANEOUS CORONARY STENT INTERVENTION (PCI-S)  02/17/2014   Procedure: PERCUTANEOUS CORONARY STENT  INTERVENTION (PCI-S);  Surgeon: [name], MD;  Location: [facility] CATH LAB;  Service: Cardiovascular;;  . TRACHEOSTOMY  05/2004  . VENTRICULOSTOMY  2006   Past Medical History:  Diagnosis Date  . Acid reflux   . 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  . Chronic lower back pain   . Headache    "monthly" (02/18/2014)  . Hypertension   . 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 Stroke [facility]) 2006   "left leg weak since" (02/18/2014)   Ht 5\' 7"  (1.702 m)   Wt 199 lb (90.3 kg)   BMI 31.17 kg/m   Opioid Risk Score:   Fall Risk Score:   `1  Depression screen PHQ 2/9  Depression screen [facility] 2/9 01/01/2020 11/06/2019 10/09/2019 09/11/2019 08/12/2019 03/04/2019 01/05/2019  Decreased Interest 0 0 0 0 0 0 0  Down, Depressed, Hopeless 0 0 0 0 0 0 0  PHQ - 2 Score 0 0 0 0 0 0 0    Review of Systems  Constitutional: Negative.   HENT: Negative.   Eyes: Negative.   Respiratory: Negative.   Cardiovascular: Negative.   Gastrointestinal: Negative.   Endocrine: Negative.   Genitourinary: Negative.   Musculoskeletal: Positive for back pain and gait problem.  Skin: Negative.   Allergic/Immunologic: Negative.   Hematological: Negative.   Psychiatric/Behavioral: Negative.   All other systems reviewed and are negative.      Objective:   Physical Exam Vitals and nursing note reviewed.  Constitutional:      Appearance: Normal appearance.  Cardiovascular:     Pulses: Normal pulses.     Heart sounds: Normal heart sounds.  Pulmonary:     Effort: Pulmonary effort is normal.     Breath sounds: Normal breath sounds.  Musculoskeletal:     Cervical back: Normal range of motion and neck supple.     Comments: Normal Muscle Bulk and Muscle Testing Reveals:  Upper Extremities: Full ROM and Muscle Strength 5/5 Lumbar Paraspinal Tenderness: L-4-L-5 Lower Extremities: Full ROM and Muscle Strength 5/5 Arises from chair slowly using cane for support Antalgic Gait   Skin:    General: Skin is warm and dry.  Neurological:     Mental Status: He is alert and oriented to person, place, and time.  Psychiatric:        Mood and Affect: Mood normal.        Behavior: Behavior normal.           Assessment & Plan:  1. History of multiple strokes with gait disorder, ongoing ataxia andspasticleft hemiparesis: ContinueHEP as Tolerated.04/22/2020 2. Chronic low back pain with documented facet arthropathy and history of intradural defect/schwanomma requiring resection, per Dr. 04/24/2020 Note. Continue current medication  regimen. Refilled:Percocet10/325 one q6 prn #120.04/22/2020 We will continue the opioid monitoring program, this consists of regular clinic visits, examinations, urine drug screen, pill counts as well as use of 04/24/2020 Controlled Substance Reporting system. A 12 month History has been reviewed on the [place] Controlled Substance Reporting Systemon01/14/2022  F/U in 1 month

## 2020-04-29 LAB — TOXASSURE SELECT,+ANTIDEPR,UR

## 2020-05-02 ENCOUNTER — Telehealth: Payer: Self-pay | Admitting: *Deleted

## 2020-05-02 NOTE — Telephone Encounter (Signed)
Urine drug screen for this encounter is consistent for prescribed medication 

## 2020-05-06 DIAGNOSIS — K219 Gastro-esophageal reflux disease without esophagitis: Secondary | ICD-10-CM | POA: Diagnosis not present

## 2020-05-06 DIAGNOSIS — Z1159 Encounter for screening for other viral diseases: Secondary | ICD-10-CM | POA: Diagnosis not present

## 2020-05-06 DIAGNOSIS — I10 Essential (primary) hypertension: Secondary | ICD-10-CM | POA: Diagnosis not present

## 2020-05-06 DIAGNOSIS — D539 Nutritional anemia, unspecified: Secondary | ICD-10-CM | POA: Diagnosis not present

## 2020-05-06 DIAGNOSIS — R351 Nocturia: Secondary | ICD-10-CM | POA: Diagnosis not present

## 2020-05-06 DIAGNOSIS — E789 Disorder of lipoprotein metabolism, unspecified: Secondary | ICD-10-CM | POA: Diagnosis not present

## 2020-05-06 DIAGNOSIS — E559 Vitamin D deficiency, unspecified: Secondary | ICD-10-CM | POA: Diagnosis not present

## 2020-05-06 DIAGNOSIS — I639 Cerebral infarction, unspecified: Secondary | ICD-10-CM | POA: Diagnosis not present

## 2020-05-20 ENCOUNTER — Encounter: Payer: Self-pay | Admitting: Registered Nurse

## 2020-05-20 ENCOUNTER — Encounter: Payer: Medicaid Other | Attending: Physical Medicine & Rehabilitation | Admitting: Registered Nurse

## 2020-05-20 ENCOUNTER — Other Ambulatory Visit: Payer: Self-pay

## 2020-05-20 VITALS — BP 126/85 | HR 88 | Temp 98.3°F | Ht 67.0 in | Wt 205.0 lb

## 2020-05-20 DIAGNOSIS — G894 Chronic pain syndrome: Secondary | ICD-10-CM | POA: Insufficient documentation

## 2020-05-20 DIAGNOSIS — Z79891 Long term (current) use of opiate analgesic: Secondary | ICD-10-CM | POA: Insufficient documentation

## 2020-05-20 DIAGNOSIS — Z5181 Encounter for therapeutic drug level monitoring: Secondary | ICD-10-CM | POA: Insufficient documentation

## 2020-05-20 DIAGNOSIS — R269 Unspecified abnormalities of gait and mobility: Secondary | ICD-10-CM | POA: Diagnosis not present

## 2020-05-20 DIAGNOSIS — M47816 Spondylosis without myelopathy or radiculopathy, lumbar region: Secondary | ICD-10-CM | POA: Diagnosis not present

## 2020-05-20 DIAGNOSIS — G8114 Spastic hemiplegia affecting left nondominant side: Secondary | ICD-10-CM | POA: Diagnosis not present

## 2020-05-20 MED ORDER — OXYCODONE-ACETAMINOPHEN 10-325 MG PO TABS: 1.0000 | ORAL_TABLET | Freq: Four times a day (QID) | ORAL | 0 refills | Status: DC | PRN

## 2020-05-20 NOTE — Progress Notes (Signed)
Subjective:    Patient ID: George Villa, male    DOB: 1965/09/11, 55 y.o.   MRN: [id number]  HPI: George Villa is a 55 y.o. male who returns for follow up appointment for chronic pain and medication refill. He states his pain is located in his lower back. He rates his pain 7. His current exercise regime is walking, performing stretching exercises and riding his stationary bicycle for 30 minutes 5 days a week.  Mr. Burkett Morphine equivalent is 60.00  MME.  Last UDS was Performed on 04/22/2020, it was consistent.    Pain Inventory Average Pain 8 Pain Right Now 7 My pain is intermittent and dull  In the last 24 hours, has pain interfered with the following? General activity 6 Relation with others 7 Enjoyment of life 7 What TIME of day is your pain at its worst? night Sleep (in general) Fair  Pain is worse with: walking, bending, sitting, inactivity, standing and some activites Pain improves with: medication Relief from Meds: 9  Family History  Problem Relation Age of Onset  . Heart Problems Father   . Hypertension Mother   . Stroke Other    Social History   Socioeconomic History  . Marital status: Single    Spouse name: Not on file  . Number of children: Not on file  . Years of education: Not on file  . Highest education level: Not on file  Occupational History  . Occupation: umemployed     Comment: Works in [employer]  . Smoking status: Never Smoker  . Smokeless tobacco: Never Used  Vaping Use  . Vaping Use: Never used  Substance and Sexual Activity  . Alcohol use: Yes    Alcohol/week: 6.0 standard drinks    Types: 6 Cans of beer per week    Comment: OCCASSIONALLY   . Drug use: No  . Sexual activity: Yes  Other Topics Concern  . Not on file  Social History Narrative   7 cups of caffeine a week    Social Determinants of Health   Financial Resource Strain: Not on file  Food Insecurity: Not on file  Transportation Needs: Not on  file  Physical Activity: Not on file  Stress: Not on file  Social Connections: Not on file   Past Surgical History:  Procedure Laterality Date  . BACK SURGERY    . BRAIN HEMATOMA EVACUATION  2006  . LEFT HEART CATHETERIZATION WITH CORONARY ANGIOGRAM N/A 02/17/2014   Procedure: LEFT HEART CATHETERIZATION WITH CORONARY ANGIOGRAM;  Surgeon: [name], MD;  Location: [facility] CATH LAB;  Service: Cardiovascular;  Laterality: N/A;  . LUMBAR DISC SURGERY  2013  . PERCUTANEOUS CORONARY STENT INTERVENTION (PCI-S)  02/17/2014   Procedure: PERCUTANEOUS CORONARY STENT INTERVENTION (PCI-S);  Surgeon: [name], MD;  Location: [facility] CATH LAB;  Service: Cardiovascular;;  . TRACHEOSTOMY  05/2004  . VENTRICULOSTOMY  2006   Past Surgical History:  Procedure Laterality Date  . BACK SURGERY    . BRAIN HEMATOMA EVACUATION  2006  . LEFT HEART CATHETERIZATION WITH CORONARY ANGIOGRAM N/A 02/17/2014   Procedure: LEFT HEART CATHETERIZATION WITH CORONARY ANGIOGRAM;  Surgeon: [name], MD;  Location: [facility] CATH LAB;  Service: Cardiovascular;  Laterality: N/A;  . LUMBAR DISC SURGERY  2013  . PERCUTANEOUS CORONARY STENT INTERVENTION (PCI-S)  02/17/2014   Procedure: PERCUTANEOUS CORONARY STENT INTERVENTION (PCI-S);  Surgeon: [name], MD;  Location: [facility] CATH LAB;  Service:  Cardiovascular;;  . TRACHEOSTOMY  05/2004  . VENTRICULOSTOMY  2006   Past Medical History:  Diagnosis Date  . Acid reflux   . 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  . Chronic lower back pain   . Headache    "monthly" (02/18/2014)  . Hypertension   . 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 Stroke [facility]) 2006   "left leg weak since" (02/18/2014)   BP 126/85   Pulse 88   Temp 98.3 F (36.8 C)   Ht 5\' 7"  (1.702 m)   Wt 205 lb (93 kg)   SpO2 96%   BMI 32.11 kg/m   Opioid Risk Score:   Fall Risk Score:  `1  Depression screen PHQ  2/9  Depression screen [facility] 2/9 01/01/2020 11/06/2019 10/09/2019 09/11/2019 08/12/2019 03/04/2019 01/05/2019  Decreased Interest 0 0 0 0 0 0 0  Down, Depressed, Hopeless 0 0 0 0 0 0 0  PHQ - 2 Score 0 0 0 0 0 0 0   Review of Systems  Musculoskeletal: Positive for back pain and gait problem.  All other systems reviewed and are negative.      Objective:   Physical Exam Vitals and nursing note reviewed.  Constitutional:      Appearance: Normal appearance.  Cardiovascular:     Rate and Rhythm: Normal rate and regular rhythm.     Pulses: Normal pulses.     Heart sounds: Normal heart sounds.  Pulmonary:     Effort: Pulmonary effort is normal.     Breath sounds: Normal breath sounds.  Musculoskeletal:     Cervical back: Normal range of motion and neck supple.     Comments: Normal Muscle Bulk and Muscle Testing Reveals:  Upper Extremities: Full ROM and Muscle Strength 5/5  Lumbar Paraspinal Tenderness: L-3-L-5 Lower Extremities: Full ROM and Muscle Strength 5/5 Arises from Table slowly using cane for support Antalgic  Gait   Skin:    General: Skin is warm and dry.  Neurological:     Mental Status: He is alert and oriented to person, place, and time.  Psychiatric:        Mood and Affect: Mood normal.        Behavior: Behavior normal.           Assessment & Plan:  1. History of multiple strokes with gait disorder, ongoing ataxia andspasticleft hemiparesis: ContinueHEP as Tolerated.05/20/2020 2. Chronic low back pain with documented facet arthropathy and history of intradural defect/schwanomma requiring resection, per Dr. 07/18/2020 Note. Continue current medication regimen. Refilled:Percocet10/325 one q6 prn #120.05/20/2020 We will continue the opioid monitoring program, this consists of regular clinic visits, examinations, urine drug screen, pill counts as well as use of 07/18/2020 Controlled Substance Reporting system. A 12 month History has been reviewed on the [place] Controlled Substance Reporting Systemon02/02/2021  F/U in 1 month

## 2020-06-17 ENCOUNTER — Encounter: Payer: Medicaid Other | Attending: Physical Medicine & Rehabilitation | Admitting: Registered Nurse

## 2020-06-17 ENCOUNTER — Other Ambulatory Visit: Payer: Self-pay

## 2020-06-17 ENCOUNTER — Encounter: Payer: Self-pay | Admitting: Registered Nurse

## 2020-06-17 VITALS — BP 135/85 | HR 66 | Temp 98.1°F | Ht 67.0 in | Wt 204.8 lb

## 2020-06-17 DIAGNOSIS — Z5181 Encounter for therapeutic drug level monitoring: Secondary | ICD-10-CM | POA: Diagnosis not present

## 2020-06-17 DIAGNOSIS — M47816 Spondylosis without myelopathy or radiculopathy, lumbar region: Secondary | ICD-10-CM | POA: Diagnosis not present

## 2020-06-17 DIAGNOSIS — G894 Chronic pain syndrome: Secondary | ICD-10-CM | POA: Insufficient documentation

## 2020-06-17 DIAGNOSIS — R269 Unspecified abnormalities of gait and mobility: Secondary | ICD-10-CM | POA: Insufficient documentation

## 2020-06-17 DIAGNOSIS — Z79891 Long term (current) use of opiate analgesic: Secondary | ICD-10-CM | POA: Diagnosis not present

## 2020-06-17 MED ORDER — OXYCODONE-ACETAMINOPHEN 10-325 MG PO TABS: 1.0000 | ORAL_TABLET | Freq: Four times a day (QID) | ORAL | 0 refills | Status: DC | PRN

## 2020-06-17 NOTE — Progress Notes (Signed)
Subjective:    Patient ID: George Villa, male    DOB: Jun 16, 1965, 55 y.o.   MRN: [id number]  HPI: George Villa is a 55 y.o. male who returns for follow up appointment for chronic pain and medication refill. He states his pain is located in his lower back. He rates his pain 7. His current exercise regime is walking and performing stretching exercises.  George Villa Morphine equivalent is 60.00 MME.    Last UDS was Performed on 04/22/2020, it was consistent.   Pain Inventory Average Pain 7 Pain Right Now 7 My pain is intermittent and dull  In the last 24 hours, has pain interfered with the following? General activity 7 Relation with others 7 Enjoyment of life 7 What TIME of day is your pain at its worst? night Sleep (in general) Fair  Pain is worse with: bending and standing Pain improves with: medication Relief from Meds: 9  Family History  Problem Relation Age of Onset  . Heart Problems Father   . Hypertension Mother   . Stroke Other    Social History   Socioeconomic History  . Marital status: Single    Spouse name: Not on file  . Number of children: Not on file  . Years of education: Not on file  . Highest education level: Not on file  Occupational History  . Occupation: umemployed     Comment: Works in [employer]  . Smoking status: Never Smoker  . Smokeless tobacco: Never Used  Vaping Use  . Vaping Use: Never used  Substance and Sexual Activity  . Alcohol use: Yes    Alcohol/week: 6.0 standard drinks    Types: 6 Cans of beer per week    Comment: OCCASSIONALLY   . Drug use: No  . Sexual activity: Yes  Other Topics Concern  . Not on file  Social History Narrative   7 cups of caffeine a week    Social Determinants of Health   Financial Resource Strain: Not on file  Food Insecurity: Not on file  Transportation Needs: Not on file  Physical Activity: Not on file  Stress: Not on file  Social Connections: Not on file   Past  Surgical History:  Procedure Laterality Date  . BACK SURGERY    . BRAIN HEMATOMA EVACUATION  2006  . LEFT HEART CATHETERIZATION WITH CORONARY ANGIOGRAM N/A 02/17/2014   Procedure: LEFT HEART CATHETERIZATION WITH CORONARY ANGIOGRAM;  Surgeon: [name], MD;  Location: [facility] CATH LAB;  Service: Cardiovascular;  Laterality: N/A;  . LUMBAR DISC SURGERY  2013  . PERCUTANEOUS CORONARY STENT INTERVENTION (PCI-S)  02/17/2014   Procedure: PERCUTANEOUS CORONARY STENT INTERVENTION (PCI-S);  Surgeon: [name], MD;  Location: [facility] CATH LAB;  Service: Cardiovascular;;  . TRACHEOSTOMY  05/2004  . VENTRICULOSTOMY  2006   Past Surgical History:  Procedure Laterality Date  . BACK SURGERY    . BRAIN HEMATOMA EVACUATION  2006  . LEFT HEART CATHETERIZATION WITH CORONARY ANGIOGRAM N/A 02/17/2014   Procedure: LEFT HEART CATHETERIZATION WITH CORONARY ANGIOGRAM;  Surgeon: [name], MD;  Location: [facility] CATH LAB;  Service: Cardiovascular;  Laterality: N/A;  . LUMBAR DISC SURGERY  2013  . PERCUTANEOUS CORONARY STENT INTERVENTION (PCI-S)  02/17/2014   Procedure: PERCUTANEOUS CORONARY STENT INTERVENTION (PCI-S);  Surgeon: [name], MD;  Location: [facility] CATH LAB;  Service: Cardiovascular;;  . TRACHEOSTOMY  05/2004  . VENTRICULOSTOMY  2006   Past Medical History:  Diagnosis Date  . Acid reflux   . 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  . Chronic lower back pain   . Headache    "monthly" (02/18/2014)  . Hypertension   . 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 Stroke [facility]) 2006   "left leg weak since" (02/18/2014)   There were no vitals taken for this visit.  Opioid Risk Score:   Fall Risk Score:  `1  Depression screen PHQ 2/9  Depression screen [facility] 2/9 05/20/2020 01/01/2020 11/06/2019 10/09/2019 09/11/2019 08/12/2019 03/04/2019  Decreased Interest 0 0 0 0 0 0 0  Down, Depressed, Hopeless 0 0 0 0 0 0 0  PHQ -  2 Score 0 0 0 0 0 0 0   Review of Systems  Musculoskeletal: Positive for back pain and gait problem.  All other systems reviewed and are negative.      Objective:   Physical Exam Vitals and nursing note reviewed.  Constitutional:      Appearance: Normal appearance.  Cardiovascular:     Rate and Rhythm: Normal rate and regular rhythm.     Pulses: Normal pulses.     Heart sounds: Normal heart sounds.  Pulmonary:     Effort: Pulmonary effort is normal.     Breath sounds: Normal breath sounds.  Musculoskeletal:     Cervical back: Normal range of motion and neck supple.     Comments: Normal Muscle Bulk and Muscle Testing Reveals:  Upper Extremities: Full ROM and Muscle Strength 5/5  Lumbar Paraspinal Tenderness: L-4-L-5 Lower Extremities: Right: Full ROM and Muscle Strength 5/5 Left Lower Extremity: Decreased ROM and Muscle Strength 5/5 Arises from Chair slowly using cane for support Antalgic  Gait   Skin:    General: Skin is warm and dry.  Neurological:     Mental Status: He is alert and oriented to person, place, and time.  Psychiatric:        Mood and Affect: Mood normal.        Behavior: Behavior normal.           Assessment & Plan:  1. History of multiple strokes with gait disorder, ongoing ataxia andspasticleft hemiparesis: ContinueHEP as Tolerated.06/17/2020 2. Chronic low back pain with documented facet arthropathy and history of intradural defect/schwanomma requiring resection, per Dr. Riley Kill Note. Continue current medication regimen. Refilled:Percocet10/325 one q6 prn #120.06/17/2020 We will continue the opioid monitoring program, this consists of regular clinic visits, examinations, urine drug screen, pill counts as well as use of [place] Controlled Substance Reporting system. A 12 month History has been reviewed on the [place] Controlled Substance Reporting Systemon03/02/2021  F/U in 1 month

## 2020-07-14 ENCOUNTER — Encounter: Payer: Self-pay | Admitting: Registered Nurse

## 2020-07-14 ENCOUNTER — Encounter: Payer: Medicaid Other | Attending: Physical Medicine & Rehabilitation | Admitting: Registered Nurse

## 2020-07-14 ENCOUNTER — Other Ambulatory Visit: Payer: Self-pay

## 2020-07-14 VITALS — BP 130/88 | HR 95 | Temp 98.3°F | Ht 67.0 in | Wt 207.0 lb

## 2020-07-14 DIAGNOSIS — M47816 Spondylosis without myelopathy or radiculopathy, lumbar region: Secondary | ICD-10-CM | POA: Insufficient documentation

## 2020-07-14 DIAGNOSIS — Z5181 Encounter for therapeutic drug level monitoring: Secondary | ICD-10-CM | POA: Diagnosis not present

## 2020-07-14 DIAGNOSIS — R269 Unspecified abnormalities of gait and mobility: Secondary | ICD-10-CM | POA: Insufficient documentation

## 2020-07-14 DIAGNOSIS — G894 Chronic pain syndrome: Secondary | ICD-10-CM | POA: Insufficient documentation

## 2020-07-14 DIAGNOSIS — Z79891 Long term (current) use of opiate analgesic: Secondary | ICD-10-CM | POA: Insufficient documentation

## 2020-07-14 DIAGNOSIS — G8114 Spastic hemiplegia affecting left nondominant side: Secondary | ICD-10-CM | POA: Diagnosis not present

## 2020-07-14 MED ORDER — OXYCODONE-ACETAMINOPHEN 10-325 MG PO TABS: 1.0000 | ORAL_TABLET | Freq: Four times a day (QID) | ORAL | 0 refills | Status: DC | PRN

## 2020-07-14 NOTE — Progress Notes (Signed)
Subjective:    Patient ID: George Villa, male    DOB: 01-19-1966, 55 y.o.   MRN: [id number]  HPI: George Villa is a 55 y.o. male who returns for follow up appointment for chronic pain and medication refill. He states his  pain is located in his lower back. He rates his pain 7. His current exercise regime is walking, riding his stationary bicycle daily for 30 minutes and performing stretching exercises.  Mr. Mehring Morphine equivalent is 60.00  MME.   Last UDS was Performed on 04/22/2020, it was consistent.     Pain Inventory Average Pain 7 Pain Right Now 7 My pain is dull  In the last 24 hours, has pain interfered with the following? General activity 7 Relation with others 7 Enjoyment of life 7 What TIME of day is your pain at its worst? night Sleep (in general) Fair  Pain is worse with: walking and bending Pain improves with: medication Relief from Meds: 9  Family History  Problem Relation Age of Onset  . Heart Problems Father   . Hypertension Mother   . Stroke Other    Social History   Socioeconomic History  . Marital status: Single    Spouse name: Not on file  . Number of children: Not on file  . Years of education: Not on file  . Highest education level: Not on file  Occupational History  . Occupation: umemployed     Comment: Works in [employer]  . Smoking status: Never Smoker  . Smokeless tobacco: Never Used  Vaping Use  . Vaping Use: Never used  Substance and Sexual Activity  . Alcohol use: Yes    Alcohol/week: 6.0 standard drinks    Types: 6 Cans of beer per week    Comment: OCCASSIONALLY   . Drug use: No  . Sexual activity: Yes  Other Topics Concern  . Not on file  Social History Narrative   7 cups of caffeine a week    Social Determinants of Health   Financial Resource Strain: Not on file  Food Insecurity: Not on file  Transportation Needs: Not on file  Physical Activity: Not on file  Stress: Not on file  Social  Connections: Not on file   Past Surgical History:  Procedure Laterality Date  . BACK SURGERY    . BRAIN HEMATOMA EVACUATION  2006  . LEFT HEART CATHETERIZATION WITH CORONARY ANGIOGRAM N/A 02/17/2014   Procedure: LEFT HEART CATHETERIZATION WITH CORONARY ANGIOGRAM;  Surgeon: [name], MD;  Location: [facility] CATH LAB;  Service: Cardiovascular;  Laterality: N/A;  . LUMBAR DISC SURGERY  2013  . PERCUTANEOUS CORONARY STENT INTERVENTION (PCI-S)  02/17/2014   Procedure: PERCUTANEOUS CORONARY STENT INTERVENTION (PCI-S);  Surgeon: [name], MD;  Location: [facility] CATH LAB;  Service: Cardiovascular;;  . TRACHEOSTOMY  05/2004  . VENTRICULOSTOMY  2006   Past Surgical History:  Procedure Laterality Date  . BACK SURGERY    . BRAIN HEMATOMA EVACUATION  2006  . LEFT HEART CATHETERIZATION WITH CORONARY ANGIOGRAM N/A 02/17/2014   Procedure: LEFT HEART CATHETERIZATION WITH CORONARY ANGIOGRAM;  Surgeon: [name], MD;  Location: [facility] CATH LAB;  Service: Cardiovascular;  Laterality: N/A;  . LUMBAR DISC SURGERY  2013  . PERCUTANEOUS CORONARY STENT INTERVENTION (PCI-S)  02/17/2014   Procedure: PERCUTANEOUS CORONARY STENT INTERVENTION (PCI-S);  Surgeon: [name], MD;  Location: [facility] CATH LAB;  Service: Cardiovascular;;  . TRACHEOSTOMY  05/2004  .  VENTRICULOSTOMY  2006   Past Medical History:  Diagnosis Date  . Acid reflux   . 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  . Chronic lower back pain   . Headache    "monthly" (02/18/2014)  . Hypertension   . 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 Stroke [facility]) 2006   "left leg weak since" (02/18/2014)   BP 130/88   Pulse 95   Temp 98.3 F (36.8 C)   Ht 5\' 7"  (1.702 m)   Wt 207 lb (93.9 kg)   SpO2 93%   BMI 32.42 kg/m   Opioid Risk Score:   Fall Risk Score:  `1  Depression screen PHQ 2/9  Depression screen [facility] 2/9 06/17/2020 05/20/2020 01/01/2020  11/06/2019 10/09/2019 09/11/2019 08/12/2019  Decreased Interest 0 0 0 0 0 0 0  Down, Depressed, Hopeless 0 0 0 0 0 0 0  PHQ - 2 Score 0 0 0 0 0 0 0    Review of Systems  Constitutional: Negative.   HENT: Negative.   Eyes: Negative.   Respiratory: Negative.   Cardiovascular: Negative.   Gastrointestinal: Negative.   Endocrine: Negative.   Genitourinary: Negative.   Musculoskeletal: Positive for back pain and gait problem.  Skin: Negative.   Allergic/Immunologic: Negative.   Hematological: Negative.   Psychiatric/Behavioral: Negative.   All other systems reviewed and are negative.      Objective:   Physical Exam Vitals and nursing note reviewed.  Constitutional:      Appearance: Normal appearance.  Cardiovascular:     Rate and Rhythm: Normal rate and regular rhythm.     Pulses: Normal pulses.     Heart sounds: Normal heart sounds.  Pulmonary:     Effort: Pulmonary effort is normal.     Breath sounds: Normal breath sounds.  Musculoskeletal:     Cervical back: Normal range of motion and neck supple.     Comments: Normal Muscle Bulk and Muscle Testing Reveals:  Upper Extremities: Full ROM and Muscle Strength 5/5 Lumbar Paraspinal Tenderness: L-3-L-5 Lower Extremities: Full ROM and Muscle Strength 5/5 Arises from chair slowly Antalgic Gait   Skin:    General: Skin is warm and dry.  Neurological:     Mental Status: He is alert and oriented to person, place, and time.  Psychiatric:        Mood and Affect: Mood normal.        Behavior: Behavior normal.           Assessment & Plan:  1. History of multiple strokes with gait disorder, ongoing ataxia andspasticleft hemiparesis: ContinueHEP as Tolerated.04//07 /2022 2. Chronic low back pain with documented facet arthropathy and history of intradural defect/schwanomma requiring resection, per Dr. 07-04-1992 Note. Continue current medication regimen. Refilled:Percocet10/325 one q6 prn #120.07/14/2020 We will continue the  opioid monitoring program, this consists of regular clinic visits, examinations, urine drug screen, pill counts as well as use of 09/13/2020 Controlled Substance Reporting system. A 12 month History has been reviewed on the [place] Controlled Substance Reporting Systemon04/10/2020  F/U in 1 month

## 2020-07-20 IMAGING — DX DG CHEST 2V
2 series · 2 of 2 positions shown · non-contrast
Comparison: Chest radiograph 09/27/2015, chest CT 04/19/2011

CLINICAL DATA: Dyspnea and respiratory abnormalities.

EXAM:
CHEST - 2 VIEW

[chest pa]
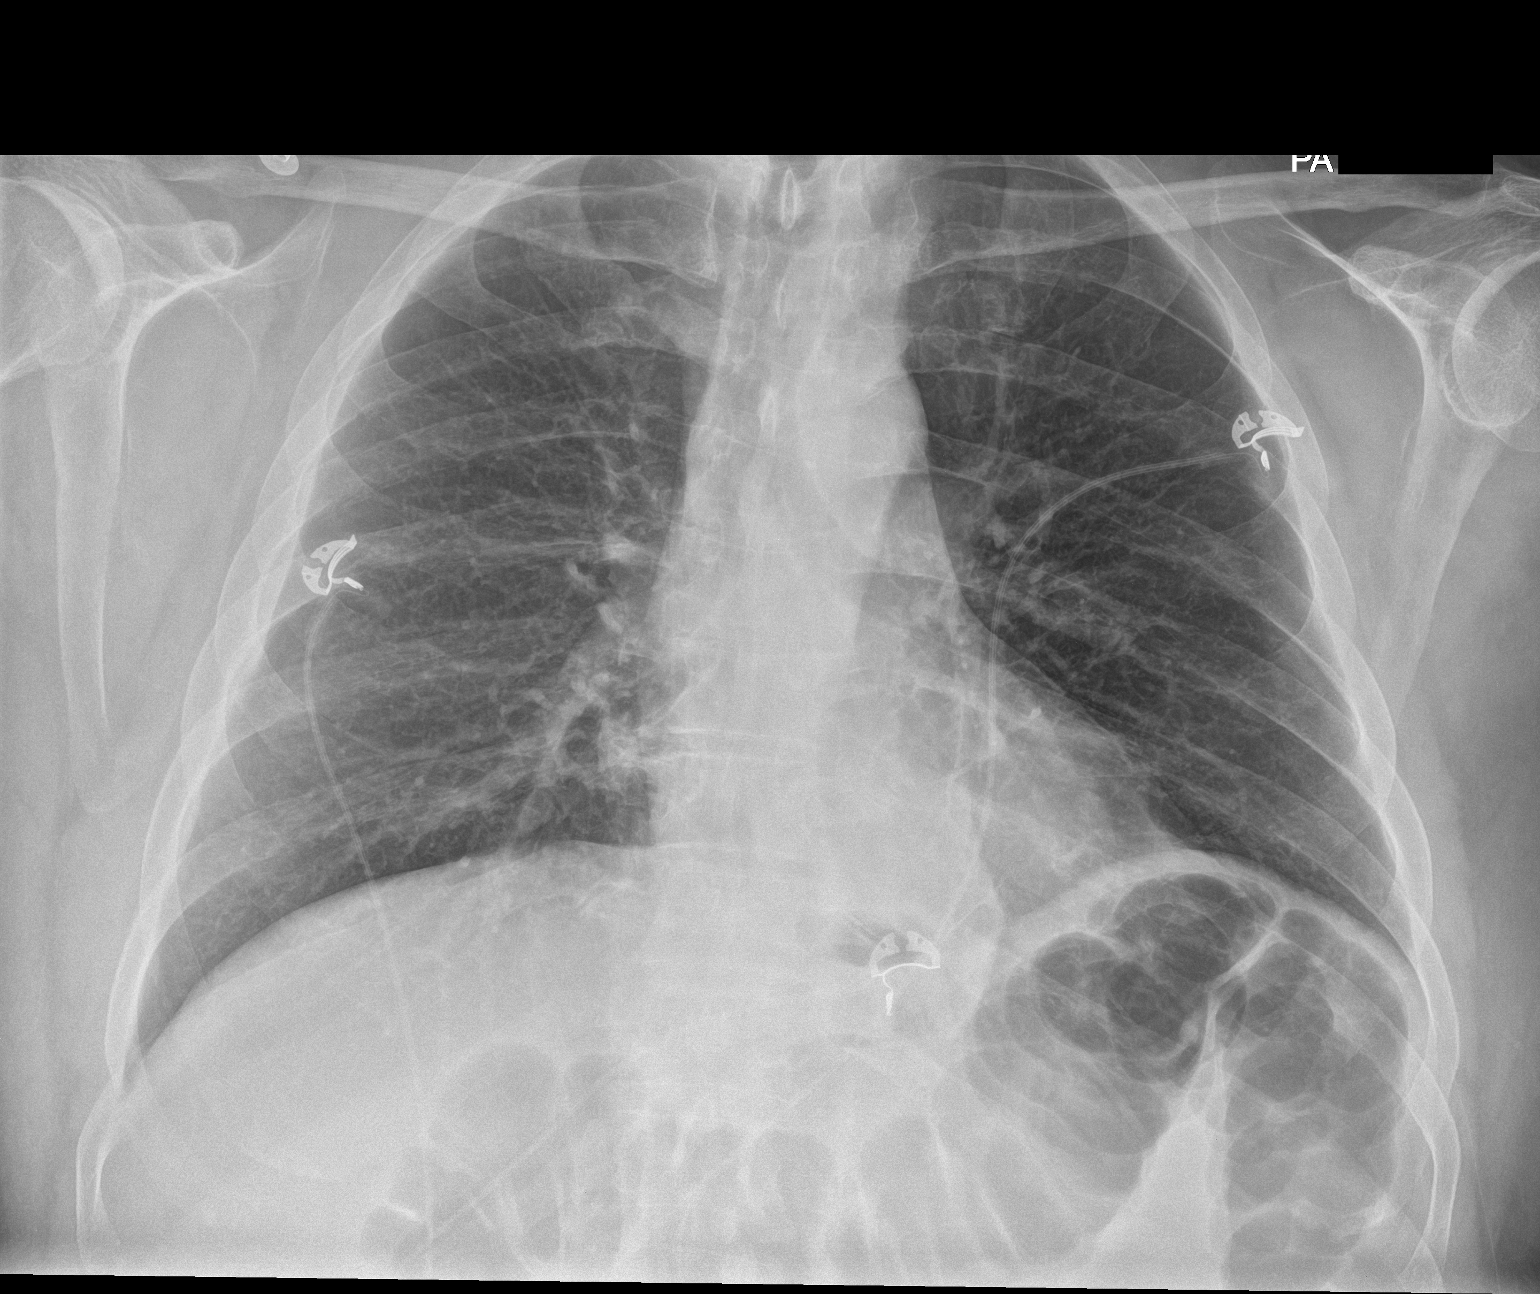

[chest lat]
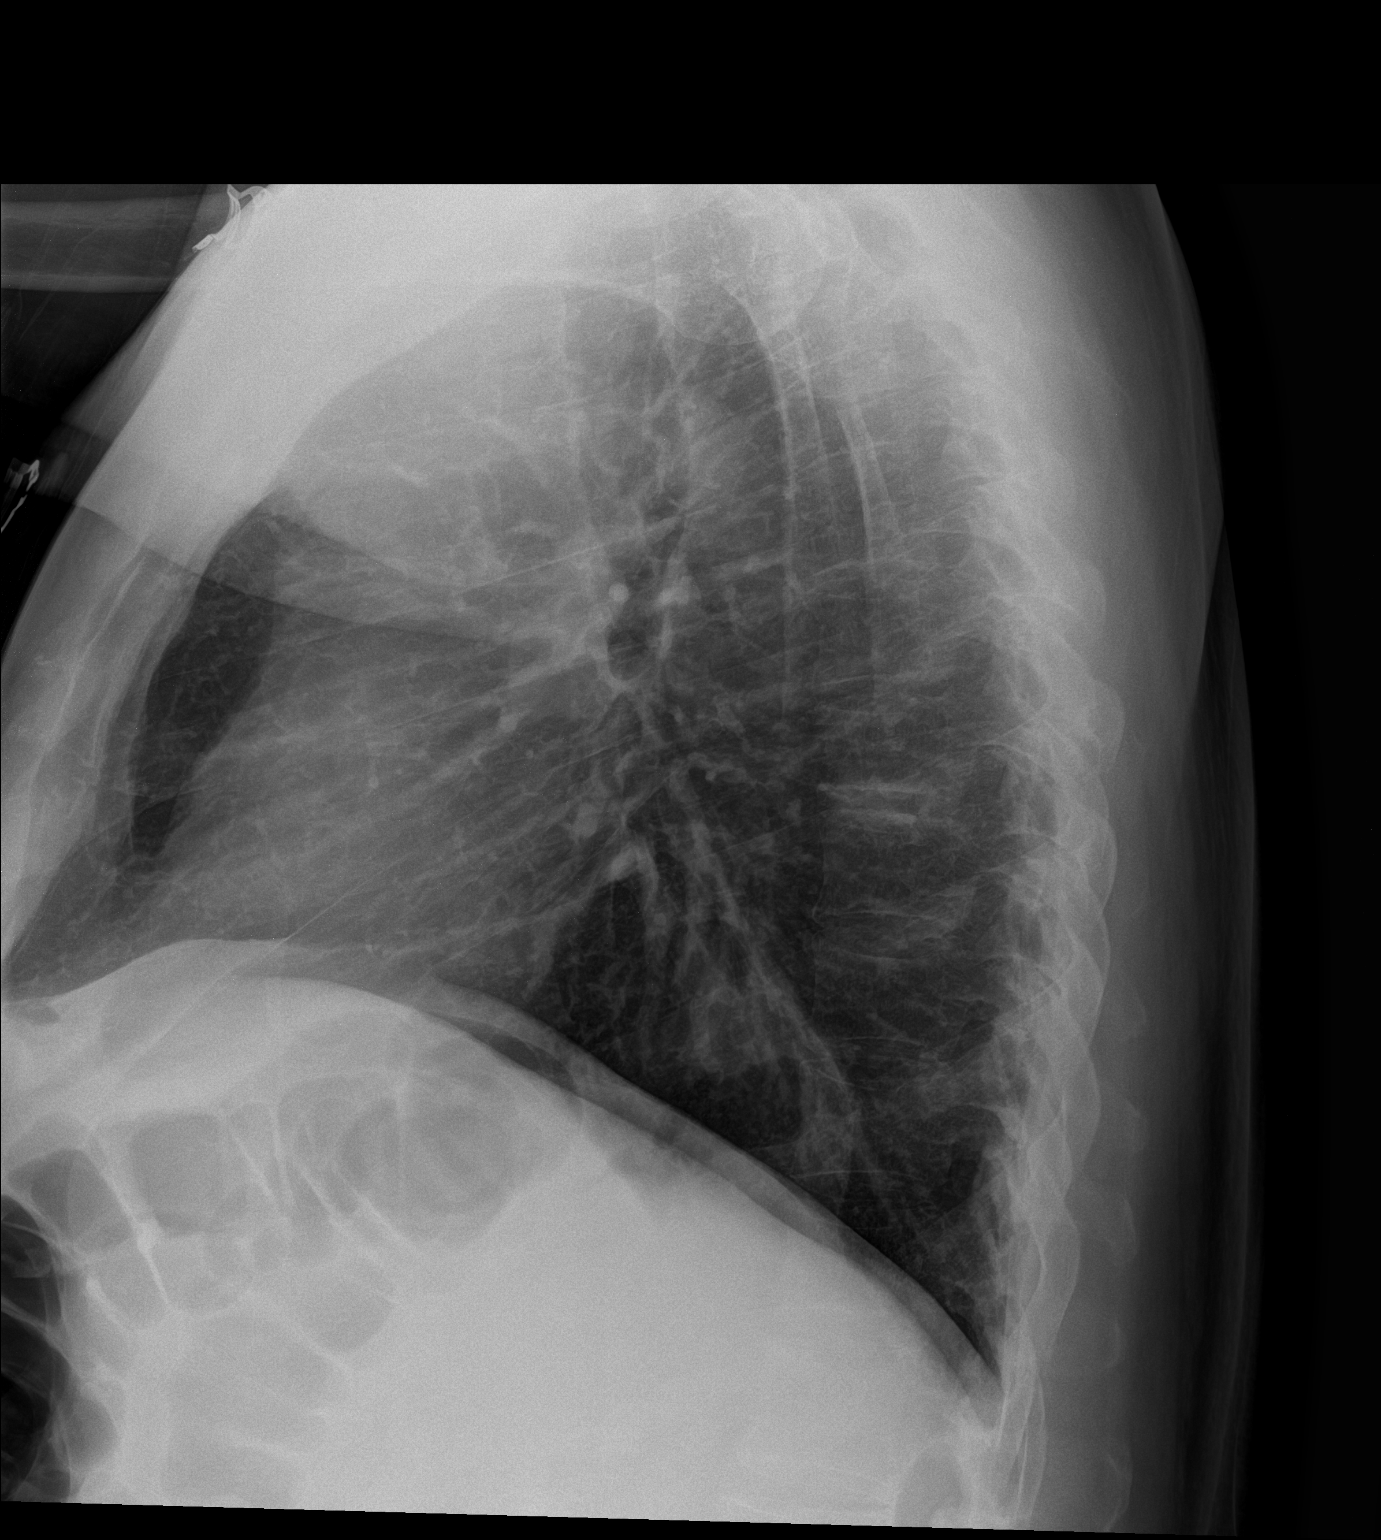

[2 of 2 positions shown; findings below may reference images not displayed]

FINDINGS: The cardiomediastinal contours are normal. Left retrocardiac opacity
is chronic and represents a hiatal hernia on prior CT. The lungs are
clear. Pulmonary vasculature is normal. No consolidation, pleural
effusion, or pneumothorax. No acute osseous abnormalities are seen.
IMPRESSION: 1. No acute chest findings.
2. Hiatal hernia.

## 2020-07-20 IMAGING — CT CT HEAD W/O CM
3 series · 15 of 47 positions shown, 18 images · non-contrast
Comparison: September 28, 2015

CLINICAL DATA: Headache

EXAM:
CT HEAD WITHOUT CONTRAST
TECHNIQUE: Contiguous axial images were obtained from the base of the skull
through the vertex without intravenous contrast.

[Series 2: head w o · axial · 0.43mm/px · z∈[+1314,+1444]mm · 9 of 32 slices shown, 12 images]
[im 3/32  brain]
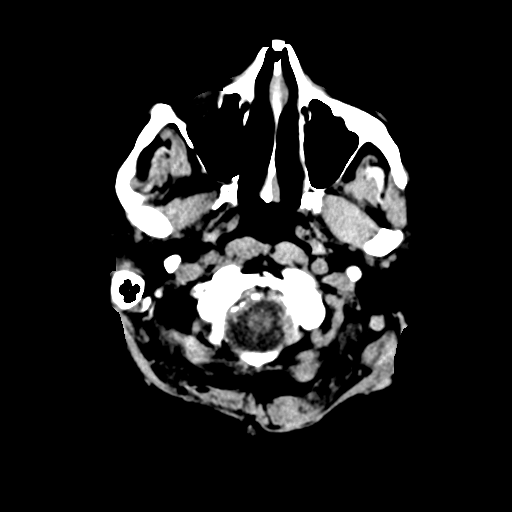
[im 3/32  bone]
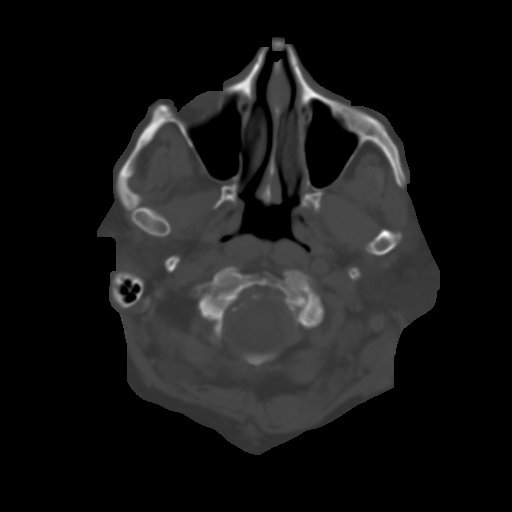
[im 6/32  brain]
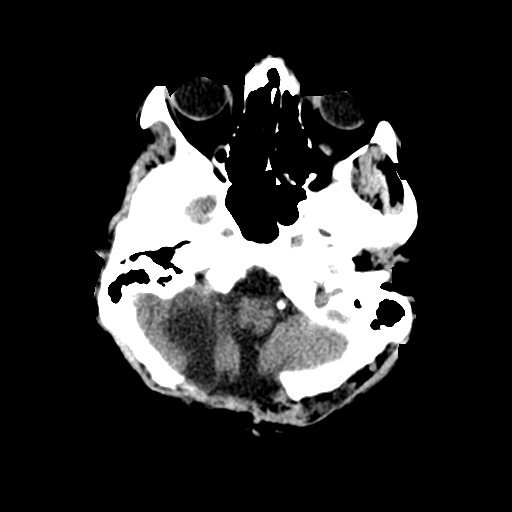
[im 9/32  brain]
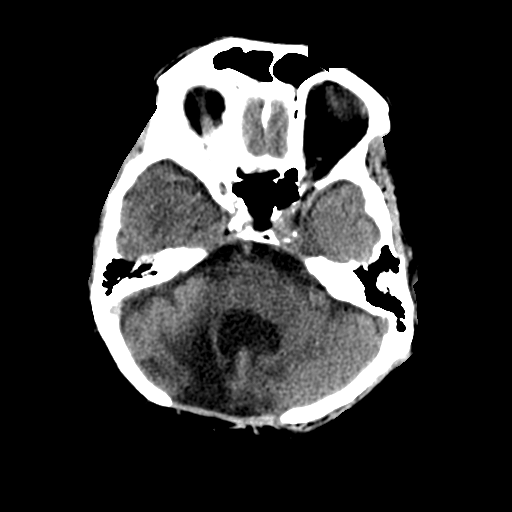
[im 12/32  brain]
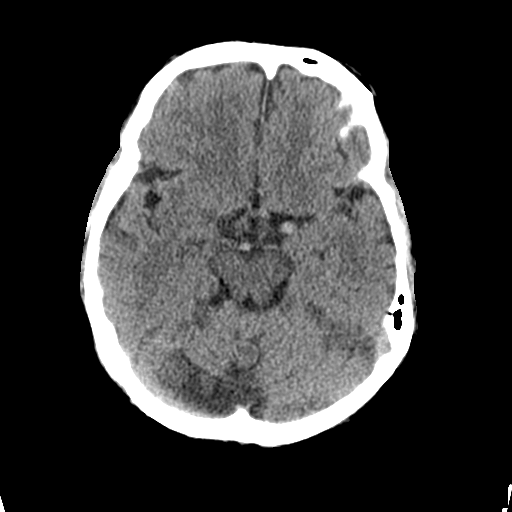
[im 17/32  brain]
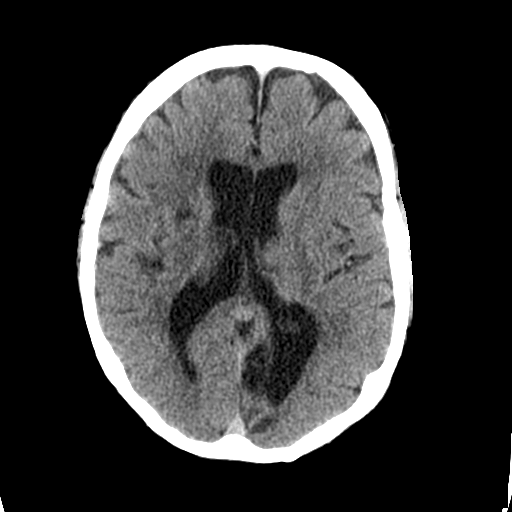
[im 17/32  bone]
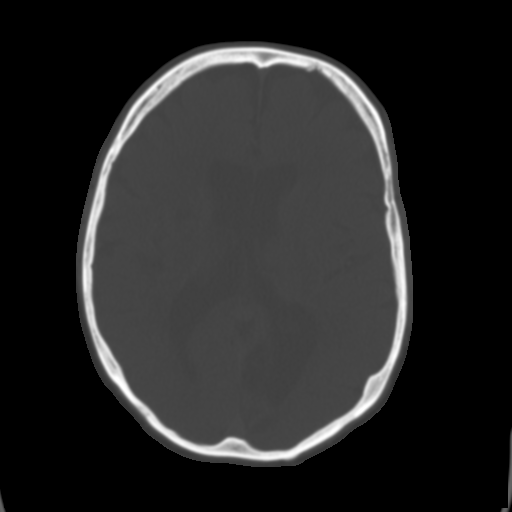
[im 20/32  brain]
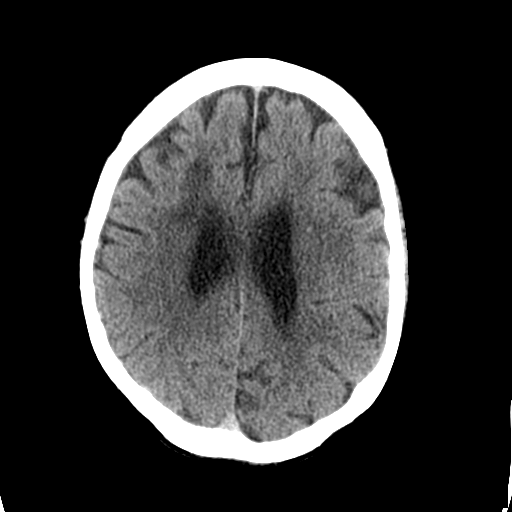
[im 23/32  brain]
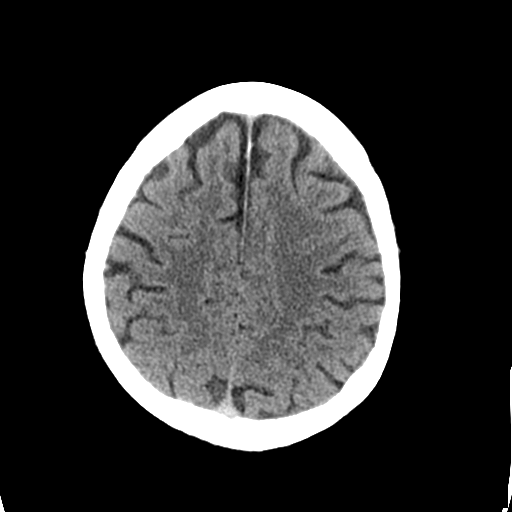
[im 26/32  brain]
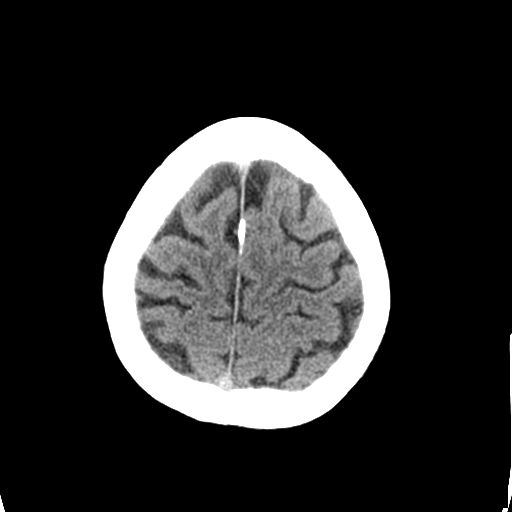
[im 29/32  brain]
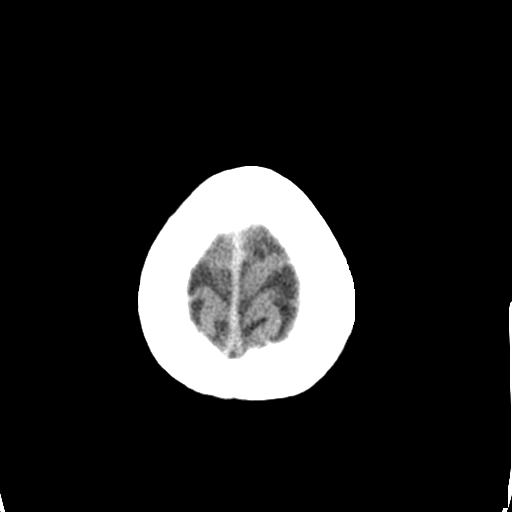
[im 29/32  bone]
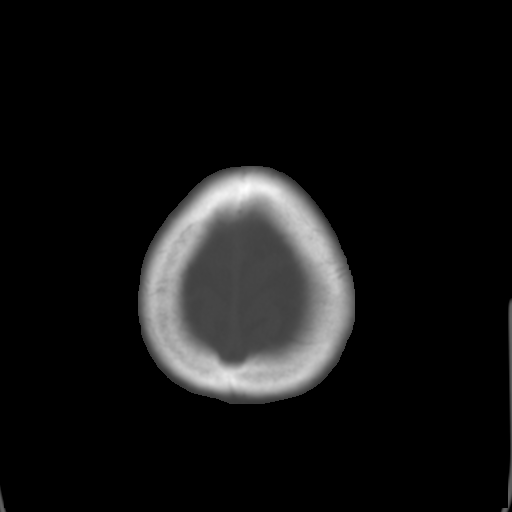

[Series 4: coronal soft · coronal · 0.35mm/px · 3 of 69 slices shown]
[im 23/69  brain]
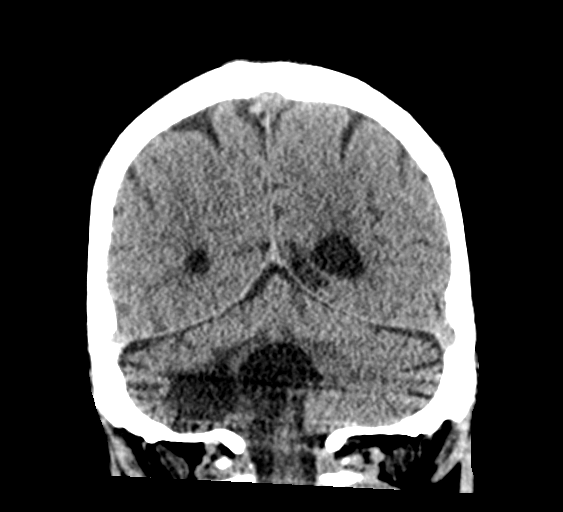
[im 31/69  brain]
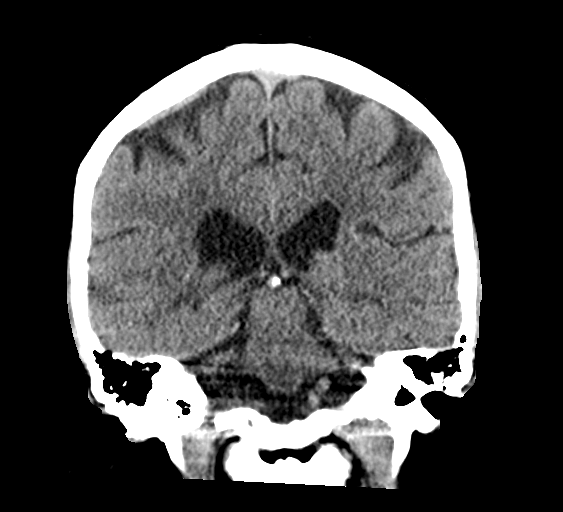
[im 38/69  brain]
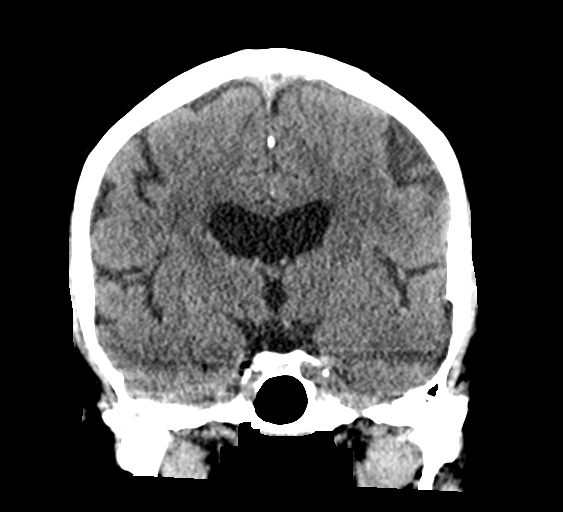

[Series 5: sagittal soft · sagittal · 0.35mm/px · 3 of 61 slices shown]
[im 21/61  brain]
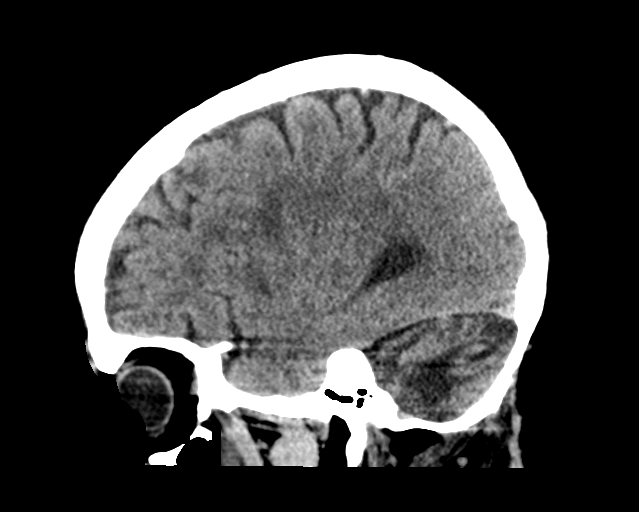
[im 31/61  brain]
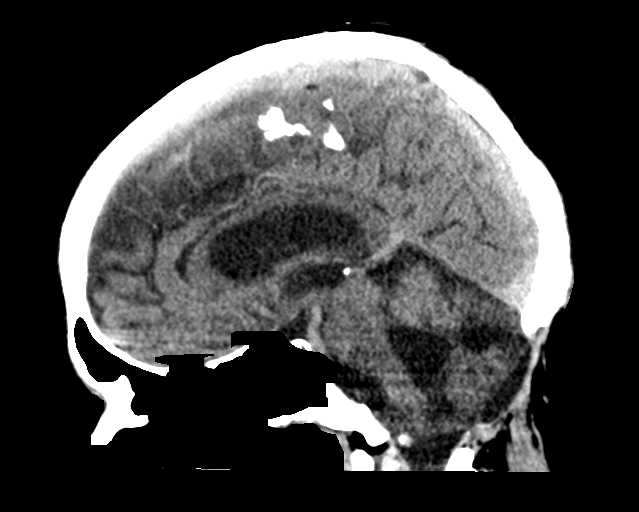
[im 41/61  brain]
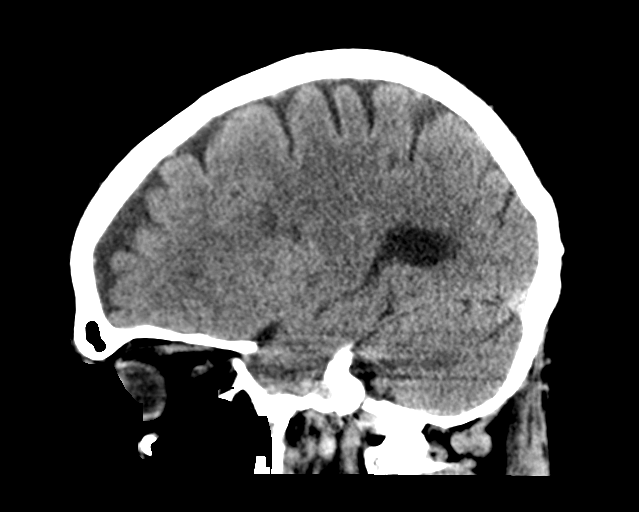

[15 of 47 positions shown; findings below may reference images not displayed]

FINDINGS: Brain: No evidence of acute territorial infarction, hemorrhage,
hydrocephalus,extra-axial collection or mass lesion/mass effect.
Stable areas of encephalomalacia involving the left occipital lobe
and right cerebellum. There is ex vacuo dilatation of the fourth
ventricle. Prior lacunar infarct seen within the right basal
ganglia. There is dilatation the ventricles and sulci consistent
with age-related atrophy. Low-attenuation changes in the deep white
matter consistent with small vessel ischemia.

Vascular: No hyperdense vessel or unexpected calcification.

Skull: There is a craniectomy defect overlying the cerebellum. No
fracture or focal lesion identified.

Sinuses/Orbits: The visualized paranasal sinuses and mastoid air
cells are clear. The orbits and globes intact.

Other: None
IMPRESSION: No acute intracranial abnormality.

Prior areas of encephalomalacia involving the cerebellum and left
occipital lobe.

Findings consistent with age related atrophy and chronic small
vessel ischemia

## 2020-08-12 ENCOUNTER — Encounter: Payer: Medicaid Other | Admitting: Registered Nurse

## 2020-08-15 ENCOUNTER — Other Ambulatory Visit: Payer: Self-pay

## 2020-08-15 ENCOUNTER — Encounter: Payer: Self-pay | Admitting: Registered Nurse

## 2020-08-15 ENCOUNTER — Encounter: Payer: Medicaid Other | Attending: Physical Medicine & Rehabilitation | Admitting: Registered Nurse

## 2020-08-15 VITALS — BP 135/85 | HR 76 | Temp 98.1°F | Ht 67.0 in | Wt 205.4 lb

## 2020-08-15 DIAGNOSIS — R269 Unspecified abnormalities of gait and mobility: Secondary | ICD-10-CM | POA: Insufficient documentation

## 2020-08-15 DIAGNOSIS — G894 Chronic pain syndrome: Secondary | ICD-10-CM | POA: Insufficient documentation

## 2020-08-15 DIAGNOSIS — M47816 Spondylosis without myelopathy or radiculopathy, lumbar region: Secondary | ICD-10-CM | POA: Insufficient documentation

## 2020-08-15 DIAGNOSIS — Z5181 Encounter for therapeutic drug level monitoring: Secondary | ICD-10-CM | POA: Diagnosis not present

## 2020-08-15 DIAGNOSIS — Z79891 Long term (current) use of opiate analgesic: Secondary | ICD-10-CM | POA: Diagnosis not present

## 2020-08-15 MED ORDER — OXYCODONE-ACETAMINOPHEN 10-325 MG PO TABS: 1.0000 | ORAL_TABLET | Freq: Four times a day (QID) | ORAL | 0 refills | Status: DC | PRN

## 2020-08-15 NOTE — Progress Notes (Signed)
Subjective:    Patient ID: George Villa, male    DOB: October 07, 1965, 55 y.o.   MRN: [id number]  HPI: George Villa is a 55 y.o. male who returns for follow up appointment for chronic pain and medication refill. He states his pain is located in his lower back. He rates his pain 7. His current exercise regime is walking, riding his stationary bicycle for 30 minutes 5 days a week. and performing stretching exercises.  George Villa Morphine equivalent is 60.00  MME.  Last UDS was Performed on 04/22/2020, it was consistent.    Pain Inventory Average Pain 8 Pain Right Now 7 My pain is intermittent and dull  In the last 24 hours, has pain interfered with the following? General activity 7 Relation with others 7 Enjoyment of life 7 What TIME of day is your pain at its worst? evening Sleep (in general) Fair  Pain is worse with: walking and standing Pain improves with: medication Relief from Meds: 7  Family History  Problem Relation Age of Onset  . Heart Problems Father   . Hypertension Mother   . Stroke Other    Social History   Socioeconomic History  . Marital status: Single    Spouse name: Not on file  . Number of children: Not on file  . Years of education: Not on file  . Highest education level: Not on file  Occupational History  . Occupation: umemployed     Comment: Works in [employer]  . Smoking status: Never Smoker  . Smokeless tobacco: Never Used  Vaping Use  . Vaping Use: Never used  Substance and Sexual Activity  . Alcohol use: Yes    Alcohol/week: 6.0 standard drinks    Types: 6 Cans of beer per week    Comment: OCCASSIONALLY   . Drug use: No  . Sexual activity: Yes  Other Topics Concern  . Not on file  Social History Narrative   7 cups of caffeine a week    Social Determinants of Health   Financial Resource Strain: Not on file  Food Insecurity: Not on file  Transportation Needs: Not on file  Physical Activity: Not on file   Stress: Not on file  Social Connections: Not on file   Past Surgical History:  Procedure Laterality Date  . BACK SURGERY    . BRAIN HEMATOMA EVACUATION  2006  . LEFT HEART CATHETERIZATION WITH CORONARY ANGIOGRAM N/A 02/17/2014   Procedure: LEFT HEART CATHETERIZATION WITH CORONARY ANGIOGRAM;  Surgeon: [name], MD;  Location: [facility] CATH LAB;  Service: Cardiovascular;  Laterality: N/A;  . LUMBAR DISC SURGERY  2013  . PERCUTANEOUS CORONARY STENT INTERVENTION (PCI-S)  02/17/2014   Procedure: PERCUTANEOUS CORONARY STENT INTERVENTION (PCI-S);  Surgeon: [name], MD;  Location: [facility] CATH LAB;  Service: Cardiovascular;;  . TRACHEOSTOMY  05/2004  . VENTRICULOSTOMY  2006   Past Surgical History:  Procedure Laterality Date  . BACK SURGERY    . BRAIN HEMATOMA EVACUATION  2006  . LEFT HEART CATHETERIZATION WITH CORONARY ANGIOGRAM N/A 02/17/2014   Procedure: LEFT HEART CATHETERIZATION WITH CORONARY ANGIOGRAM;  Surgeon: [name], MD;  Location: [facility] CATH LAB;  Service: Cardiovascular;  Laterality: N/A;  . LUMBAR DISC SURGERY  2013  . PERCUTANEOUS CORONARY STENT INTERVENTION (PCI-S)  02/17/2014   Procedure: PERCUTANEOUS CORONARY STENT INTERVENTION (PCI-S);  Surgeon: [name], MD;  Location: [facility] CATH LAB;  Service: Cardiovascular;;  . TRACHEOSTOMY  05/2004  . VENTRICULOSTOMY  2006   Past Medical History:  Diagnosis Date  . Acid reflux   . 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  . Chronic lower back pain   . Headache    "monthly" (02/18/2014)  . Hypertension   . 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 Stroke [facility]) 2006   "left leg weak since" (02/18/2014)   BP 135/85   Pulse 76   Temp 98.1 F (36.7 C)   Ht 5\' 7"  (1.702 m)   Wt 205 lb 6.4 oz (93.2 kg)   SpO2 96%   BMI 32.17 kg/m   Opioid Risk Score:   Fall Risk Score:  `1  Depression screen PHQ 2/9  Depression screen [facility]  2/9 06/17/2020 05/20/2020 01/01/2020 11/06/2019 10/09/2019 09/11/2019 08/12/2019  Decreased Interest 0 0 0 0 0 0 0  Down, Depressed, Hopeless 0 0 0 0 0 0 0  PHQ - 2 Score 0 0 0 0 0 0 0   Review of Systems  Musculoskeletal: Positive for back pain and gait problem.  All other systems reviewed and are negative.      Objective:   Physical Exam Vitals and nursing note reviewed.  Constitutional:      Appearance: Normal appearance.  Cardiovascular:     Rate and Rhythm: Normal rate and regular rhythm.     Pulses: Normal pulses.     Heart sounds: Normal heart sounds.  Pulmonary:     Effort: Pulmonary effort is normal.     Breath sounds: Normal breath sounds.  Musculoskeletal:     Cervical back: Normal range of motion and neck supple.     Comments: Normal Muscle Bulk and Muscle Testing Reveals:  Upper Extremities: Full ROM and Muscle Strength 5/5  Lumbar Paraspinal Tenderness: L-3-L5 Lower Extremities: Full ROM and Muscle Strength 5/5 Arises from chair slowly, using cane for support Antalgic Gait   Skin:    General: Skin is warm and dry.  Neurological:     Mental Status: He is alert and oriented to person, place, and time.  Psychiatric:        Mood and Affect: Mood normal.        Behavior: Behavior normal.           Assessment & Plan:  1. History of multiple strokes with gait disorder, ongoing ataxia andspasticleft hemiparesis: ContinueHEP as Tolerated.05//09 /2022 2. Chronic low back pain with documented facet arthropathy and history of intradural defect/schwanomma requiring resection, per Dr. 07-04-1992 Note. Continue current medication regimen. Refilled:Percocet10/325 one q6 prn #120.08/15/2020 We will continue the opioid monitoring program, this consists of regular clinic visits, examinations, urine drug screen, pill counts as well as use of 10/15/2020 Controlled Substance Reporting system. A 12 month History has been reviewed on the [place] Controlled Substance  Reporting Systemon05/12/2020  F/U in 1 month

## 2020-08-26 DIAGNOSIS — E559 Vitamin D deficiency, unspecified: Secondary | ICD-10-CM | POA: Diagnosis not present

## 2020-08-26 DIAGNOSIS — I10 Essential (primary) hypertension: Secondary | ICD-10-CM | POA: Diagnosis not present

## 2020-08-26 DIAGNOSIS — Z1159 Encounter for screening for other viral diseases: Secondary | ICD-10-CM | POA: Diagnosis not present

## 2020-08-26 DIAGNOSIS — I639 Cerebral infarction, unspecified: Secondary | ICD-10-CM | POA: Diagnosis not present

## 2020-08-26 DIAGNOSIS — E789 Disorder of lipoprotein metabolism, unspecified: Secondary | ICD-10-CM | POA: Diagnosis not present

## 2020-08-26 DIAGNOSIS — R351 Nocturia: Secondary | ICD-10-CM | POA: Diagnosis not present

## 2020-08-26 DIAGNOSIS — K219 Gastro-esophageal reflux disease without esophagitis: Secondary | ICD-10-CM | POA: Diagnosis not present

## 2020-09-09 ENCOUNTER — Encounter: Payer: Medicaid Other | Attending: Physical Medicine & Rehabilitation | Admitting: Registered Nurse

## 2020-09-09 ENCOUNTER — Encounter: Payer: Self-pay | Admitting: Registered Nurse

## 2020-09-09 ENCOUNTER — Other Ambulatory Visit: Payer: Self-pay

## 2020-09-09 VITALS — BP 118/79 | HR 74 | Temp 98.8°F | Ht 67.0 in | Wt 207.4 lb

## 2020-09-09 DIAGNOSIS — Z5181 Encounter for therapeutic drug level monitoring: Secondary | ICD-10-CM | POA: Insufficient documentation

## 2020-09-09 DIAGNOSIS — G894 Chronic pain syndrome: Secondary | ICD-10-CM | POA: Diagnosis not present

## 2020-09-09 DIAGNOSIS — R269 Unspecified abnormalities of gait and mobility: Secondary | ICD-10-CM | POA: Diagnosis not present

## 2020-09-09 DIAGNOSIS — Z79891 Long term (current) use of opiate analgesic: Secondary | ICD-10-CM | POA: Insufficient documentation

## 2020-09-09 DIAGNOSIS — M47816 Spondylosis without myelopathy or radiculopathy, lumbar region: Secondary | ICD-10-CM | POA: Insufficient documentation

## 2020-09-09 MED ORDER — OXYCODONE-ACETAMINOPHEN 10-325 MG PO TABS: 1.0000 | ORAL_TABLET | Freq: Four times a day (QID) | ORAL | 0 refills | Status: DC | PRN

## 2020-09-09 NOTE — Progress Notes (Signed)
Subjective:    Patient ID: George Villa, male    DOB: 01-27-1966, 55 y.o.   MRN: [id number]  HPI: [name] is a 55 y.o. male who returns for follow up appointment for chronic pain and medication refill. He states his pain is located in his lower back. He rates his pain 7. His current exercise regime is walking and performing stretching exercises.  [name] Morphine equivalent is 60.00  MME.  UDS was Performed today.    Pain Inventory Average Pain 7 Pain Right Now 7 My pain is dull  In the last 24 hours, has pain interfered with the following? General activity 7 Relation with others 7 Enjoyment of life 7 What TIME of day is your pain at its worst? evening Sleep (in general) Fair  Pain is worse with: walking and bending Pain improves with: medication Relief from Meds: 8  Family History  Problem Relation Age of Onset  . Heart Problems Father   . Hypertension Mother   . Stroke Other    Social History   Socioeconomic History  . Marital status: Single    Spouse name: Not on file  . Number of children: Not on file  . Years of education: Not on file  . Highest education level: Not on file  Occupational History  . Occupation: umemployed     Comment: Works in [employer]  . Smoking status: Never Smoker  . Smokeless tobacco: Never Used  Vaping Use  . Vaping Use: Never used  Substance and Sexual Activity  . Alcohol use: Yes    Alcohol/week: 6.0 standard drinks    Types: 6 Cans of beer per week    Comment: OCCASSIONALLY   . Drug use: No  . Sexual activity: Yes  Other Topics Concern  . Not on file  Social History Narrative   7 cups of caffeine a week    Social Determinants of Health   Financial Resource Strain: Not on file  Food Insecurity: Not on file  Transportation Needs: Not on file  Physical Activity: Not on file  Stress: Not on file  Social Connections: Not on file   Past Surgical History:  Procedure Laterality Date  . BACK  SURGERY    . BRAIN HEMATOMA EVACUATION  2006  . LEFT HEART CATHETERIZATION WITH CORONARY ANGIOGRAM N/A 02/17/2014   Procedure: LEFT HEART CATHETERIZATION WITH CORONARY ANGIOGRAM;  Surgeon: [name], MD;  Location: [facility] CATH LAB;  Service: Cardiovascular;  Laterality: N/A;  . LUMBAR DISC SURGERY  2013  . PERCUTANEOUS CORONARY STENT INTERVENTION (PCI-S)  02/17/2014   Procedure: PERCUTANEOUS CORONARY STENT INTERVENTION (PCI-S);  Surgeon: [name], MD;  Location: [facility] CATH LAB;  Service: Cardiovascular;;  . TRACHEOSTOMY  05/2004  . VENTRICULOSTOMY  2006   Past Surgical History:  Procedure Laterality Date  . BACK SURGERY    . BRAIN HEMATOMA EVACUATION  2006  . LEFT HEART CATHETERIZATION WITH CORONARY ANGIOGRAM N/A 02/17/2014   Procedure: LEFT HEART CATHETERIZATION WITH CORONARY ANGIOGRAM;  Surgeon: [name], MD;  Location: [facility] CATH LAB;  Service: Cardiovascular;  Laterality: N/A;  . LUMBAR DISC SURGERY  2013  . PERCUTANEOUS CORONARY STENT INTERVENTION (PCI-S)  02/17/2014   Procedure: PERCUTANEOUS CORONARY STENT INTERVENTION (PCI-S);  Surgeon: [name], MD;  Location: [facility] CATH LAB;  Service: Cardiovascular;;  . TRACHEOSTOMY  05/2004  . VENTRICULOSTOMY  2006   Past Medical History:  Diagnosis Date  . Acid reflux   .  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  . Chronic lower back pain   . Headache    "monthly" (02/18/2014)  . Hypertension   . 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 Stroke [name]) 2006   "left leg weak since" (02/18/2014)   BP 118/79   Pulse 74   Temp 98.8 F (37.1 C)   Ht 5\' 7"  (1.702 m)   Wt 207 lb 6.4 oz (94.1 kg)   SpO2 93%   BMI 32.48 kg/m   Opioid Risk Score:   Fall Risk Score:  `1  Depression screen PHQ 2/9  Depression screen [facility] 2/9 09/09/2020 08/15/2020 06/17/2020 05/20/2020 01/01/2020 11/06/2019 10/09/2019  Decreased Interest 0 0 0 0 0 0 0  Down,  Depressed, Hopeless 0 0 0 0 0 0 0  PHQ - 2 Score 0 0 0 0 0 0 0   Review of Systems  Constitutional: Negative.   HENT: Negative.   Eyes: Negative.   Respiratory: Negative.   Cardiovascular: Negative.   Gastrointestinal: Negative.   Endocrine: Negative.   Genitourinary: Negative.   Musculoskeletal: Positive for back pain.  Skin: Negative.   Allergic/Immunologic: Negative.   Neurological: Negative.   Hematological: Negative.   Psychiatric/Behavioral: Negative.   All other systems reviewed and are negative.      Objective:   Physical Exam Vitals and nursing note reviewed.  Constitutional:      Appearance: Normal appearance.  Cardiovascular:     Rate and Rhythm: Normal rate and regular rhythm.     Pulses: Normal pulses.     Heart sounds: Normal heart sounds.  Pulmonary:     Effort: Pulmonary effort is normal.     Breath sounds: Normal breath sounds.  Musculoskeletal:     Cervical back: Normal range of motion and neck supple.     Comments: Normal Muscle Bulk and Muscle Testing Reveals:  Upper Extremities: Full ROM and Muscle Strength 5/5 Lumbar Paraspinal Tenderness: L-3-L-5 Lower Extremities: Full ROM and Muscle Strength 5/5 Arises from Table slowly using cane for support Antalgic  Gait   Skin:    General: Skin is warm and dry.  Neurological:     Mental Status: He is alert and oriented to person, place, and time.  Psychiatric:        Mood and Affect: Mood normal.        Behavior: Behavior normal.           Assessment & Plan:  1. History of multiple strokes with gait disorder, ongoing ataxia andspasticleft hemiparesis: ContinueHEP as Tolerated.06//06/2020 2. Chronic low back pain with documented facet arthropathy and history of intradural defect/schwanomma requiring resection, per Dr. 07/2020 Note. Continue current medication regimen. Refilled:Percocet10/325 one q6 prn #120.09/09/2020 We will continue the opioid monitoring program, this consists of  regular clinic visits, examinations, urine drug screen, pill counts as well as use of 11/09/2020 Controlled Substance Reporting system. A 12 month History has been reviewed on the [place] Controlled Substance Reporting Systemon06/06/2020  F/U in 1 month

## 2020-09-15 LAB — TOXASSURE SELECT,+ANTIDEPR,UR

## 2020-09-21 ENCOUNTER — Telehealth: Payer: Self-pay | Admitting: *Deleted

## 2020-09-21 NOTE — Telephone Encounter (Signed)
Urine drug screen for this encounter is consistent for prescribed medication 

## 2020-09-27 ENCOUNTER — Telehealth: Payer: Self-pay | Admitting: Internal Medicine

## 2020-09-27 NOTE — Telephone Encounter (Signed)
..   Medicaid Managed Care   Unsuccessful Outreach Note  09/27/2020 Name: George Villa MRN: [id number] DOB: [birth year]  Referred by: Lahoma Crocker, MD Reason for referral : High Risk Managed Medicaid (Attempted to reach Mr.Mangold today to get him scheduled with the [facility]. I left a message on his VM.)   An unsuccessful telephone outreach was attempted today. The patient was referred to the case management team for assistance with care management and care coordination.   Follow Up Plan: The care management team will reach out to the patient again over the next 7-14 days.   Weston Settle Care Guide, High Risk Medicaid Managed Care Embedded Care Coordination [name]  [facility]

## 2020-10-11 ENCOUNTER — Encounter: Payer: Self-pay | Admitting: Registered Nurse

## 2020-10-11 ENCOUNTER — Encounter: Payer: Medicaid Other | Attending: Physical Medicine & Rehabilitation | Admitting: Registered Nurse

## 2020-10-11 ENCOUNTER — Other Ambulatory Visit: Payer: Self-pay

## 2020-10-11 VITALS — BP 124/80 | HR 75 | Temp 98.1°F | Ht 67.0 in | Wt 205.4 lb

## 2020-10-11 DIAGNOSIS — G894 Chronic pain syndrome: Secondary | ICD-10-CM | POA: Insufficient documentation

## 2020-10-11 DIAGNOSIS — Z5181 Encounter for therapeutic drug level monitoring: Secondary | ICD-10-CM | POA: Insufficient documentation

## 2020-10-11 DIAGNOSIS — Z79891 Long term (current) use of opiate analgesic: Secondary | ICD-10-CM | POA: Insufficient documentation

## 2020-10-11 DIAGNOSIS — M47816 Spondylosis without myelopathy or radiculopathy, lumbar region: Secondary | ICD-10-CM | POA: Insufficient documentation

## 2020-10-11 DIAGNOSIS — R269 Unspecified abnormalities of gait and mobility: Secondary | ICD-10-CM | POA: Diagnosis not present

## 2020-10-11 MED ORDER — OXYCODONE-ACETAMINOPHEN 10-325 MG PO TABS: 1.0000 | ORAL_TABLET | Freq: Four times a day (QID) | ORAL | 0 refills | Status: DC | PRN

## 2020-10-11 NOTE — Progress Notes (Signed)
Subjective:    Patient ID: George Villa, male    DOB: May 26, 1965, 55 y.o.   MRN: [id number]  HPI: George Villa is a 55 y.o. male who returns for follow up appointment for chronic pain and medication refill. He  states his pain is located in his lower back. He rates his pain 7. His current exercise regime is walking and riding his stationary bicycle for 30 minutes daily.    Mr. Mcfadyen Morphine equivalent is 60.00  MME.   Last UDS was Performed on 09/09/2020, it was consistent.    Pain Inventory Average Pain 7 Pain Right Now 7 My pain is intermittent and dull  In the last 24 hours, has pain interfered with the following? General activity 7 Relation with others 7 Enjoyment of life 7 What TIME of day is your pain at its worst? night Sleep (in general) Fair  Pain is worse with: walking and standing Pain improves with: rest, medication, and heat Relief from Meds: 8  Family History  Problem Relation Age of Onset   Heart Problems Father    Hypertension Mother    Stroke Other    Social History   Socioeconomic History   Marital status: Single    Spouse name: Not on file   Number of children: Not on file   Years of education: Not on file   Highest education level: Not on file  Occupational History   Occupation: umemployed     Comment: Works in [employer]   Tobacco Use   Smoking status: Never   Smokeless tobacco: Never  Vaping Use   Vaping Use: Never used  Substance and Sexual Activity   Alcohol use: Yes    Alcohol/week: 6.0 standard drinks    Types: 6 Cans of beer per week    Comment: OCCASSIONALLY    Drug use: No   Sexual activity: Yes  Other Topics Concern   Not on file  Social History Narrative   7 cups of caffeine a week    Social Determinants of Health   Financial Resource Strain: Not on file  Food Insecurity: Not on file  Transportation Needs: Not on file  Physical Activity: Not on file  Stress: Not on file  Social Connections: Not on file    Past Surgical History:  Procedure Laterality Date   BACK SURGERY     BRAIN HEMATOMA EVACUATION  2006   LEFT HEART CATHETERIZATION WITH CORONARY ANGIOGRAM N/A 02/17/2014   Procedure: LEFT HEART CATHETERIZATION WITH CORONARY ANGIOGRAM;  Surgeon: [name], MD;  Location: [facility] CATH LAB;  Service: Cardiovascular;  Laterality: N/A;   LUMBAR DISC SURGERY  2013   PERCUTANEOUS CORONARY STENT INTERVENTION (PCI-S)  02/17/2014   Procedure: PERCUTANEOUS CORONARY STENT INTERVENTION (PCI-S);  Surgeon: [name], MD;  Location: [facility] CATH LAB;  Service: Cardiovascular;;   TRACHEOSTOMY  05/2004   VENTRICULOSTOMY  2006   Past Surgical History:  Procedure Laterality Date   BACK SURGERY     BRAIN HEMATOMA EVACUATION  2006   LEFT HEART CATHETERIZATION WITH CORONARY ANGIOGRAM N/A 02/17/2014   Procedure: LEFT HEART CATHETERIZATION WITH CORONARY ANGIOGRAM;  Surgeon: [name], MD;  Location: [facility] CATH LAB;  Service: Cardiovascular;  Laterality: N/A;   LUMBAR DISC SURGERY  2013   PERCUTANEOUS CORONARY STENT INTERVENTION (PCI-S)  02/17/2014   Procedure: PERCUTANEOUS CORONARY STENT INTERVENTION (PCI-S);  Surgeon: [name], MD;  Location: [facility] CATH LAB;  Service: Cardiovascular;;   TRACHEOSTOMY  05/2004  VENTRICULOSTOMY  2006   Past Medical History:  Diagnosis Date   Acid reflux    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   [name] 02/17/2014   Stroke (HCC) 2006   "left leg weak since" (02/18/2014)   BP 124/80   Pulse 75   Temp 98.1 F (36.7 C)   Ht 5\' 7"  (1.702 m)   Wt 205 lb 6.4 oz (93.2 kg)   SpO2 95%   BMI 32.17 kg/m   Opioid Risk Score:   Fall Risk Score:  `1  Depression screen PHQ 2/9  Depression screen [facility] 2/9 09/09/2020 08/15/2020 06/17/2020 05/20/2020 01/01/2020 11/06/2019 10/09/2019   Decreased Interest 0 0 0 0 0 0 0  Down, Depressed, Hopeless 0 0 0 0 0 0 0  PHQ - 2 Score 0 0 0 0 0 0 0    Review of Systems  Musculoskeletal:  Positive for back pain and gait problem.  All other systems reviewed and are negative.     Objective:   Physical Exam Vitals and nursing note reviewed.  Constitutional:      Appearance: Normal appearance.  Cardiovascular:     Rate and Rhythm: Normal rate and regular rhythm.     Pulses: Normal pulses.     Heart sounds: Normal heart sounds.  Pulmonary:     Effort: Pulmonary effort is normal.     Breath sounds: Normal breath sounds.  Musculoskeletal:     Cervical back: Normal range of motion and neck supple.     Comments: Normal Muscle Bulk and Muscle Testing Reveals:  Upper Extremities: Full ROM and Muscle Strength 5/5 Lumbar Paraspinal Tenderness: L-3-L-5 Lower Extremities: Full ROM and Muscle Strength 5/5 Arises from Chair Slowly using cane for support Antalgic Gait     Skin:    General: Skin is warm and dry.  Neurological:     Mental Status: He is alert and oriented to person, place, and time.  Psychiatric:        Mood and Affect: Mood normal.        Behavior: Behavior normal.         Assessment & Plan:  1. History of multiple strokes with gait disorder, ongoing ataxia and spastic left hemiparesis: Continue  HEP as Tolerated. 07//05 /2022 2. Chronic low back pain with documented facet arthropathy and history of intradural defect/schwanomma requiring resection, per Dr. 07-04-1992 Note.  Continue current medication regimen. Refilled: Percocet 10/325 one q6 prn #120. 10/11/2020  We will continue the opioid monitoring program, this consists of regular clinic visits, examinations, urine drug screen, pill counts as well as use of 12/12/2020 Controlled Substance Reporting system. A 12 month History has been reviewed on the [place] Controlled Substance Reporting System on 10/11/2020    F/U in 1 month

## 2020-10-18 ENCOUNTER — Telehealth: Payer: Self-pay | Admitting: Internal Medicine

## 2020-10-18 NOTE — Telephone Encounter (Signed)
..   Medicaid Managed Care   Unsuccessful Outreach Note  10/18/2020 Name: George Villa MRN: [id number] DOB: [birth year]  Referred by: Lahoma Crocker, MD Reason for referral : High Risk Managed Medicaid (Attempted to reach patient today to get him scheduled for a phone visit with the Managed Medicaid Team. I left my name and number on his VM.)   A second unsuccessful telephone outreach was attempted today. The patient was referred to the case management team for assistance with care management and care coordination.   Follow Up Plan: The care management team will reach out to the patient again over the next 7-14 days.   Weston Settle Care Guide, High Risk Medicaid Managed Care Embedded Care Coordination [facility]  [facility]

## 2020-10-26 ENCOUNTER — Telehealth: Payer: Self-pay | Admitting: Internal Medicine

## 2020-10-26 NOTE — Telephone Encounter (Signed)
..   Medicaid Managed Care   Unsuccessful Outreach Note  10/26/2020 Name: George Villa MRN: [id number] DOB: November 06, 1965  Referred by: Lahoma Crocker, MD Reason for referral : High Risk Managed Medicaid (Attempted to reach patient to get him scheduled for a phone visit with the MM Team. I left my name and number on his VM.)   Third unsuccessful telephone outreach was attempted today. The patient was referred to the case management team for assistance with care management and care coordination. The patient's primary care provider has been notified of our unsuccessful attempts to make or maintain contact with the patient. The care management team is pleased to engage with this patient at any time in the future should he/she be interested in assistance from the care management team.   Follow Up Plan: We have been unable to make contact with the patient for follow up. The care management team is available to follow up with the patient after provider conversation with the patient regarding recommendation for care management engagement and subsequent re-referral to the care management team.   Weston Settle Care Guide, High Risk Medicaid Managed Care Embedded Care Coordination [facility]  [facility]

## 2020-11-08 ENCOUNTER — Encounter: Payer: Self-pay | Admitting: Registered Nurse

## 2020-11-08 ENCOUNTER — Other Ambulatory Visit: Payer: Self-pay

## 2020-11-08 ENCOUNTER — Encounter: Payer: Medicaid Other | Attending: Physical Medicine & Rehabilitation | Admitting: Registered Nurse

## 2020-11-08 VITALS — BP 136/90 | HR 78 | Temp 98.3°F | Wt 206.0 lb

## 2020-11-08 DIAGNOSIS — G8114 Spastic hemiplegia affecting left nondominant side: Secondary | ICD-10-CM

## 2020-11-08 DIAGNOSIS — Z79891 Long term (current) use of opiate analgesic: Secondary | ICD-10-CM

## 2020-11-08 DIAGNOSIS — G894 Chronic pain syndrome: Secondary | ICD-10-CM

## 2020-11-08 DIAGNOSIS — R269 Unspecified abnormalities of gait and mobility: Secondary | ICD-10-CM | POA: Diagnosis not present

## 2020-11-08 DIAGNOSIS — M47816 Spondylosis without myelopathy or radiculopathy, lumbar region: Secondary | ICD-10-CM | POA: Diagnosis not present

## 2020-11-08 DIAGNOSIS — Z5181 Encounter for therapeutic drug level monitoring: Secondary | ICD-10-CM

## 2020-11-08 MED ORDER — OXYCODONE-ACETAMINOPHEN 10-325 MG PO TABS: 1.0000 | ORAL_TABLET | Freq: Four times a day (QID) | ORAL | 0 refills | Status: DC | PRN

## 2020-11-08 NOTE — Progress Notes (Signed)
Subjective:    Patient ID: George Villa, male    DOB: Dec 23, 1965, 55 y.o.   MRN: [id number]  HPI: George Villa is a 55 y.o. male who returns for follow up appointment for chronic pain and medication refill. He states his pain is located in his lower back. He rates his pain 8. His current exercise regime is walking and riding his stationary bicycle for 30 minutes.  George Villa Morphine equivalent is 60.00 MME.  Last UDS was Performed on 09/09/2020, it was consistent.    Pain Inventory Average Pain 8 Pain Right Now 8 My pain is dull  In the last 24 hours, has pain interfered with the following? General activity 8 Relation with others 8 Enjoyment of life 8 What TIME of day is your pain at its worst? varies Sleep (in general) Fair  Pain is worse with: walking and standing Pain improves with: medication Relief from Meds: 9  Family History  Problem Relation Age of Onset   Heart Problems Father    Hypertension Mother    Stroke Other    Social History   Socioeconomic History   Marital status: Single    Spouse name: Not on file   Number of children: Not on file   Years of education: Not on file   Highest education level: Not on file  Occupational History   Occupation: umemployed     Comment: Works in [employer]   Tobacco Use   Smoking status: Never   Smokeless tobacco: Never  Vaping Use   Vaping Use: Never used  Substance and Sexual Activity   Alcohol use: Yes    Alcohol/week: 6.0 standard drinks    Types: 6 Cans of beer per week    Comment: OCCASSIONALLY    Drug use: No   Sexual activity: Yes  Other Topics Concern   Not on file  Social History Narrative   7 cups of caffeine a week    Social Determinants of Health   Financial Resource Strain: Not on file  Food Insecurity: Not on file  Transportation Needs: Not on file  Physical Activity: Not on file  Stress: Not on file  Social Connections: Not on file   Past Surgical History:  Procedure  Laterality Date   BACK SURGERY     BRAIN HEMATOMA EVACUATION  2006   LEFT HEART CATHETERIZATION WITH CORONARY ANGIOGRAM N/A 02/17/2014   Procedure: LEFT HEART CATHETERIZATION WITH CORONARY ANGIOGRAM;  Surgeon: [name], MD;  Location: [facility] CATH LAB;  Service: Cardiovascular;  Laterality: N/A;   LUMBAR DISC SURGERY  2013   PERCUTANEOUS CORONARY STENT INTERVENTION (PCI-S)  02/17/2014   Procedure: PERCUTANEOUS CORONARY STENT INTERVENTION (PCI-S);  Surgeon: [name], MD;  Location: [facility] CATH LAB;  Service: Cardiovascular;;   TRACHEOSTOMY  05/2004   VENTRICULOSTOMY  2006   Past Surgical History:  Procedure Laterality Date   BACK SURGERY     BRAIN HEMATOMA EVACUATION  2006   LEFT HEART CATHETERIZATION WITH CORONARY ANGIOGRAM N/A 02/17/2014   Procedure: LEFT HEART CATHETERIZATION WITH CORONARY ANGIOGRAM;  Surgeon: [name], MD;  Location: [facility] CATH LAB;  Service: Cardiovascular;  Laterality: N/A;   LUMBAR DISC SURGERY  2013   PERCUTANEOUS CORONARY STENT INTERVENTION (PCI-S)  02/17/2014   Procedure: PERCUTANEOUS CORONARY STENT INTERVENTION (PCI-S);  Surgeon: [name], MD;  Location: [facility] CATH LAB;  Service: Cardiovascular;;   TRACHEOSTOMY  05/2004   VENTRICULOSTOMY  2006   Past Medical History:  Diagnosis Date   Acid reflux    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   HENT: Negative.    Eyes: Negative.   Respiratory: Negative.    Cardiovascular: Negative.   Gastrointestinal: Negative.   Endocrine: Negative.   Genitourinary: Negative.   Musculoskeletal:  Positive for back pain and gait problem.  Skin: Negative.   Allergic/Immunologic: Negative.   Hematological: Negative.       Objective:   Physical Exam Vitals and nursing note reviewed.  Constitutional:      Appearance: Normal appearance.  Cardiovascular:     Rate and Rhythm: Normal rate and regular rhythm.     Pulses: Normal pulses.     Heart sounds: Normal heart sounds.  Pulmonary:     Effort: Pulmonary effort is normal.     Breath sounds: Normal breath sounds.  Musculoskeletal:     Cervical back: Normal range of motion and neck supple.     Comments: Normal Muscle Bulk and Muscle Testing Reveals:  Upper Extremities: Full ROM and Muscle Strength 5/5 Lower Extremities: Full ROM and Muscle Strength 5/5 Arises from Table slowly using cane for support Antalgic Gait     Skin:    General: Skin is warm and dry.  Neurological:     Mental Status: He is alert and oriented to person, place, and time.  Psychiatric:        Mood and Affect: Mood normal.        Behavior: Behavior normal.         Assessment & Plan:  1. History of multiple strokes with gait disorder, ongoing ataxia and spastic left hemiparesis: Continue  HEP as Tolerated. 11/08/2020 2. Chronic low back pain with documented facet arthropathy and history of intradural defect/schwanomma requiring resection, per Dr. Riley Kill Note.  Continue current medication regimen. Refilled: Percocet 10/325 one q6 prn #120. 11/08/2020  We will continue the opioid monitoring program, this consists of regular clinic visits, examinations, urine drug screen, pill counts as well as use of [place] Controlled Substance Reporting  system. A 12 month History has been reviewed on the [place] Controlled Substance Reporting System on 11/08/2020    F/U in 1 month

## 2020-12-08 ENCOUNTER — Encounter: Payer: Medicaid Other | Attending: Physical Medicine & Rehabilitation | Admitting: Registered Nurse

## 2020-12-08 ENCOUNTER — Other Ambulatory Visit: Payer: Self-pay

## 2020-12-08 ENCOUNTER — Telehealth: Payer: Self-pay

## 2020-12-08 VITALS — BP 140/91 | HR 87 | Temp 98.8°F | Ht 67.0 in | Wt 206.2 lb

## 2020-12-08 DIAGNOSIS — G8114 Spastic hemiplegia affecting left nondominant side: Secondary | ICD-10-CM | POA: Insufficient documentation

## 2020-12-08 DIAGNOSIS — G894 Chronic pain syndrome: Secondary | ICD-10-CM | POA: Diagnosis not present

## 2020-12-08 DIAGNOSIS — R269 Unspecified abnormalities of gait and mobility: Secondary | ICD-10-CM | POA: Diagnosis not present

## 2020-12-08 DIAGNOSIS — Z79891 Long term (current) use of opiate analgesic: Secondary | ICD-10-CM | POA: Insufficient documentation

## 2020-12-08 DIAGNOSIS — M47816 Spondylosis without myelopathy or radiculopathy, lumbar region: Secondary | ICD-10-CM | POA: Insufficient documentation

## 2020-12-08 DIAGNOSIS — Z5181 Encounter for therapeutic drug level monitoring: Secondary | ICD-10-CM | POA: Diagnosis not present

## 2020-12-08 MED ORDER — OXYCODONE-ACETAMINOPHEN 10-325 MG PO TABS: 1.0000 | ORAL_TABLET | Freq: Four times a day (QID) | ORAL | 0 refills | Status: DC | PRN

## 2020-12-08 NOTE — Progress Notes (Signed)
Subjective:    Patient ID: George Villa, male    DOB: 1965/07/18, 55 y.o.   MRN: [id number]  HPI: George Villa is a 55 y.o. male who returns for follow up appointment for chronic pain and medication refill. He states his pain is located in his lower back pain. He rates his pain 8.His current exercise regime is walking and performing stretching exercises.  [name] Morphine equivalent is 60.00 MME. Last UDS was Performed on 09/09/2020, it was consistent.       Pain Inventory Average Pain 8 Pain Right Now 8 My pain is dull  In the last 24 hours, has pain interfered with the following? General activity 8 Relation with others 8 Enjoyment of life 8 What TIME of day is your pain at its worst? night Sleep (in general) Fair  Pain is worse with: walking and standing Pain improves with: medication Relief from Meds: 5  Family History  Problem Relation Age of Onset   Heart Problems Father    Hypertension Mother    Stroke Other    Social History   Socioeconomic History   Marital status: Single    Spouse name: Not on file   Number of children: Not on file   Years of education: Not on file   Highest education level: Not on file  Occupational History   Occupation: umemployed     Comment: Works in [employer]   Tobacco Use   Smoking status: Never   Smokeless tobacco: Never  Vaping Use   Vaping Use: Never used  Substance and Sexual Activity   Alcohol use: Yes    Alcohol/week: 6.0 standard drinks    Types: 6 Cans of beer per week    Comment: OCCASSIONALLY    Drug use: No   Sexual activity: Yes  Other Topics Concern   Not on file  Social History Narrative   7 cups of caffeine a week    Social Determinants of Health   Financial Resource Strain: Not on file  Food Insecurity: Not on file  Transportation Needs: Not on file  Physical Activity: Not on file  Stress: Not on file  Social Connections: Not on file   Past Surgical History:  Procedure Laterality  Date   BACK SURGERY     BRAIN HEMATOMA EVACUATION  2006   LEFT HEART CATHETERIZATION WITH CORONARY ANGIOGRAM N/A 02/17/2014   Procedure: LEFT HEART CATHETERIZATION WITH CORONARY ANGIOGRAM;  Surgeon: [name], MD;  Location: [facility] CATH LAB;  Service: Cardiovascular;  Laterality: N/A;   LUMBAR DISC SURGERY  2013   PERCUTANEOUS CORONARY STENT INTERVENTION (PCI-S)  02/17/2014   Procedure: PERCUTANEOUS CORONARY STENT INTERVENTION (PCI-S);  Surgeon: [name], MD;  Location: [facility] CATH LAB;  Service: Cardiovascular;;   TRACHEOSTOMY  05/2004   VENTRICULOSTOMY  2006   Past Surgical History:  Procedure Laterality Date   BACK SURGERY     BRAIN HEMATOMA EVACUATION  2006   LEFT HEART CATHETERIZATION WITH CORONARY ANGIOGRAM N/A 02/17/2014   Procedure: LEFT HEART CATHETERIZATION WITH CORONARY ANGIOGRAM;  Surgeon: [name], MD;  Location: [facility] CATH LAB;  Service: Cardiovascular;  Laterality: N/A;   LUMBAR DISC SURGERY  2013   PERCUTANEOUS CORONARY STENT INTERVENTION (PCI-S)  02/17/2014   Procedure: PERCUTANEOUS CORONARY STENT INTERVENTION (PCI-S);  Surgeon: [name], MD;  Location: [facility] CATH LAB;  Service: Cardiovascular;;   TRACHEOSTOMY  05/2004   VENTRICULOSTOMY  2006   Past Medical History:  Diagnosis Date  Acid reflux    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   [name] 02/17/2014   Stroke (HCC) 2006   "left leg weak since" (02/18/2014)   BP (!) 140/91 (BP Location: Left Arm, Patient Position: Sitting, Cuff Size: Normal)   Pulse 87   Temp 98.8 F (37.1 C) (Oral)   Ht 5\' 7"  (1.702 m)   Wt 206 lb 3.2 oz (93.5 kg)   SpO2 96%   BMI 32.30 kg/m   Opioid Risk Score:   Fall Risk Score:  `1  Depression screen PHQ 2/9  Depression screen [facility] 2/9 11/08/2020 10/11/2020 09/09/2020 08/15/2020  06/17/2020 05/20/2020 01/01/2020  Decreased Interest 0 0 0 0 0 0 0  Down, Depressed, Hopeless 0 0 0 0 0 0 0  PHQ - 2 Score 0 0 0 0 0 0 0    Review of Systems  Musculoskeletal:  Positive for back pain.  All other systems reviewed and are negative.     Objective:   Physical Exam Vitals and nursing note reviewed.  Constitutional:      Appearance: Normal appearance.  Cardiovascular:     Rate and Rhythm: Normal rate and regular rhythm.     Pulses: Normal pulses.     Heart sounds: Normal heart sounds.  Pulmonary:     Effort: Pulmonary effort is normal.     Breath sounds: Normal breath sounds.  Musculoskeletal:     Cervical back: Normal range of motion and neck supple.     Comments: Normal Muscle Bulk and Muscle Testing Reveals:  Upper Extremities: Full ROM and Muscle Strength 5/5 Lumbar Paraspinal Tenderness: L-4-L-5 Lower Extremities: Full ROM and Muscle Strength 5/5 Arises from chair slowly using cane for support Antalgic Gait      Skin:    General: Skin is warm and dry.  Neurological:     Mental Status: He is alert and oriented to person, place, and time.  Psychiatric:        Mood and Affect: Mood normal.        Behavior: Behavior normal.         Assessment & Plan:  1. History of multiple strokes with gait disorder, ongoing ataxia and spastic left hemiparesis: Continue  HEP as Tolerated. 12/08/2020 2. Chronic low back pain with documented facet arthropathy and history of intradural defect/schwanomma requiring resection, per Dr. 02/07/2021 Note.  Continue current medication regimen. Refilled: Percocet 10/325 one q6 prn #120. 12/08/2020  We will continue the opioid monitoring program, this consists of regular clinic visits, examinations, urine drug screen, pill counts as well as use of 02/07/2021 Controlled Substance Reporting system. A 12 month History has been reviewed on the [place] Controlled Substance Reporting System on 12/08/2020    F/U in 1  month

## 2020-12-08 NOTE — Telephone Encounter (Signed)
/  PA started for Oxycodone 10-325 tablet on 12/08/2020.

## 2020-12-09 NOTE — Telephone Encounter (Signed)
Re: Initial Service Request Determination-Approval Dear George Villa: [facility] has reviewed the request for Oxycod/Apap Tab 10-325mg  submitted by George Villa on behalf of George Villa on 12/08/2020. After review, the request for service is: Approved through 06/07/2021. Sincerely, Pharmacy Department

## 2020-12-12 ENCOUNTER — Encounter: Payer: Self-pay | Admitting: Registered Nurse

## 2020-12-13 DIAGNOSIS — R351 Nocturia: Secondary | ICD-10-CM | POA: Diagnosis not present

## 2020-12-13 DIAGNOSIS — I639 Cerebral infarction, unspecified: Secondary | ICD-10-CM | POA: Diagnosis not present

## 2020-12-13 DIAGNOSIS — Z1159 Encounter for screening for other viral diseases: Secondary | ICD-10-CM | POA: Diagnosis not present

## 2020-12-13 DIAGNOSIS — Z13220 Encounter for screening for lipoid disorders: Secondary | ICD-10-CM | POA: Diagnosis not present

## 2020-12-13 DIAGNOSIS — Z125 Encounter for screening for malignant neoplasm of prostate: Secondary | ICD-10-CM | POA: Diagnosis not present

## 2020-12-13 DIAGNOSIS — R0602 Shortness of breath: Secondary | ICD-10-CM | POA: Diagnosis not present

## 2020-12-13 DIAGNOSIS — E789 Disorder of lipoprotein metabolism, unspecified: Secondary | ICD-10-CM | POA: Diagnosis not present

## 2020-12-13 DIAGNOSIS — K219 Gastro-esophageal reflux disease without esophagitis: Secondary | ICD-10-CM | POA: Diagnosis not present

## 2020-12-13 DIAGNOSIS — Z0000 Encounter for general adult medical examination without abnormal findings: Secondary | ICD-10-CM | POA: Diagnosis not present

## 2020-12-13 DIAGNOSIS — I10 Essential (primary) hypertension: Secondary | ICD-10-CM | POA: Diagnosis not present

## 2020-12-13 DIAGNOSIS — Z131 Encounter for screening for diabetes mellitus: Secondary | ICD-10-CM | POA: Diagnosis not present

## 2020-12-13 DIAGNOSIS — R5383 Other fatigue: Secondary | ICD-10-CM | POA: Diagnosis not present

## 2020-12-13 DIAGNOSIS — E559 Vitamin D deficiency, unspecified: Secondary | ICD-10-CM | POA: Diagnosis not present

## 2021-01-03 ENCOUNTER — Telehealth: Payer: Self-pay | Admitting: Registered Nurse

## 2021-01-03 DIAGNOSIS — M47816 Spondylosis without myelopathy or radiculopathy, lumbar region: Secondary | ICD-10-CM

## 2021-01-03 MED ORDER — OXYCODONE-ACETAMINOPHEN 10-325 MG PO TABS: 1.0000 | ORAL_TABLET | Freq: Four times a day (QID) | ORAL | 0 refills | Status: DC | PRN

## 2021-01-03 NOTE — Telephone Encounter (Signed)
George Villa appointment moved to 10/6 due to staffing, he will be out of medication by that date.

## 2021-01-03 NOTE — Addendum Note (Signed)
Addended by: Jones Bales on: 01/03/2021 05:20 PM   Modules accepted: Orders

## 2021-01-03 NOTE — Telephone Encounter (Signed)
PMP was Reviewed.  Oxycodone e-scribed today. [name] is awre of the above.

## 2021-01-06 ENCOUNTER — Encounter: Payer: Medicaid Other | Admitting: Registered Nurse

## 2021-01-06 ENCOUNTER — Telehealth: Payer: Self-pay | Admitting: Registered Nurse

## 2021-01-06 NOTE — Telephone Encounter (Signed)
Patient is calling our office back about his oxycodone prescription.  He called his drugs store and they said they don't have it.  Please call patient.

## 2021-01-06 NOTE — Telephone Encounter (Signed)
PMP was Reviewed.  [facility] was called, they had George Villa  prescription and they will fill it for him today. Placed a call to George Villa x 2, no answer, will await his return call.

## 2021-01-12 ENCOUNTER — Encounter: Payer: Medicaid Other | Admitting: Registered Nurse

## 2021-01-25 ENCOUNTER — Other Ambulatory Visit: Payer: Self-pay

## 2021-01-25 ENCOUNTER — Encounter: Payer: Self-pay | Admitting: Registered Nurse

## 2021-01-25 ENCOUNTER — Encounter: Payer: Medicaid Other | Attending: Physical Medicine & Rehabilitation | Admitting: Registered Nurse

## 2021-01-25 VITALS — BP 124/87 | HR 73 | Temp 98.8°F | Ht 67.0 in | Wt 208.2 lb

## 2021-01-25 DIAGNOSIS — Z79891 Long term (current) use of opiate analgesic: Secondary | ICD-10-CM | POA: Insufficient documentation

## 2021-01-25 DIAGNOSIS — R269 Unspecified abnormalities of gait and mobility: Secondary | ICD-10-CM | POA: Diagnosis not present

## 2021-01-25 DIAGNOSIS — Z5181 Encounter for therapeutic drug level monitoring: Secondary | ICD-10-CM | POA: Insufficient documentation

## 2021-01-25 DIAGNOSIS — G8114 Spastic hemiplegia affecting left nondominant side: Secondary | ICD-10-CM | POA: Insufficient documentation

## 2021-01-25 DIAGNOSIS — M47816 Spondylosis without myelopathy or radiculopathy, lumbar region: Secondary | ICD-10-CM | POA: Insufficient documentation

## 2021-01-25 DIAGNOSIS — G894 Chronic pain syndrome: Secondary | ICD-10-CM | POA: Insufficient documentation

## 2021-01-25 MED ORDER — OXYCODONE-ACETAMINOPHEN 10-325 MG PO TABS: 1.0000 | ORAL_TABLET | Freq: Four times a day (QID) | ORAL | 0 refills | Status: DC | PRN

## 2021-01-25 NOTE — Progress Notes (Signed)
Subjective:    Patient ID: George Villa, male    DOB: 06/08/1965, 55 y.o.   MRN: [id number]  HPI: George Villa is a 55 y.o. male who returns for follow up appointment for chronic pain and medication refill. He states his pain is located in his lower back. He rates his pain 8. His current exercise regime is walking and performing stretching exercises.  George Villa Morphine equivalent is 60.00 MME.   Last UDS was Performed on 09/09/2020, it was consistent     Pain Inventory Average Pain 8 Pain Right Now 8 My pain is intermittent and dull  In the last 24 hours, has pain interfered with the following? General activity 8 Relation with others 8 Enjoyment of life 8 What TIME of day is your pain at its worst? night Sleep (in general) Fair  Pain is worse with: walking and standing Pain improves with: heat/ice and medication Relief from Meds: 9  Family History  Problem Relation Age of Onset   Heart Problems Father    Hypertension Mother    Stroke Other    Social History   Socioeconomic History   Marital status: Single    Spouse name: Not on file   Number of children: Not on file   Years of education: Not on file   Highest education level: Not on file  Occupational History   Occupation: umemployed     Comment: Works in [employer]   Tobacco Use   Smoking status: Never   Smokeless tobacco: Never  Vaping Use   Vaping Use: Never used  Substance and Sexual Activity   Alcohol use: Yes    Alcohol/week: 6.0 standard drinks    Types: 6 Cans of beer per week    Comment: OCCASSIONALLY    Drug use: No   Sexual activity: Yes  Other Topics Concern   Not on file  Social History Narrative   7 cups of caffeine a week    Social Determinants of Health   Financial Resource Strain: Not on file  Food Insecurity: Not on file  Transportation Needs: Not on file  Physical Activity: Not on file  Stress: Not on file  Social Connections: Not on file   Past Surgical History:   Procedure Laterality Date   BACK SURGERY     BRAIN HEMATOMA EVACUATION  2006   LEFT HEART CATHETERIZATION WITH CORONARY ANGIOGRAM N/A 02/17/2014   Procedure: LEFT HEART CATHETERIZATION WITH CORONARY ANGIOGRAM;  Surgeon: [name], MD;  Location: [facility] CATH LAB;  Service: Cardiovascular;  Laterality: N/A;   LUMBAR DISC SURGERY  2013   PERCUTANEOUS CORONARY STENT INTERVENTION (PCI-S)  02/17/2014   Procedure: PERCUTANEOUS CORONARY STENT INTERVENTION (PCI-S);  Surgeon: [name], MD;  Location: [facility] CATH LAB;  Service: Cardiovascular;;   TRACHEOSTOMY  05/2004   VENTRICULOSTOMY  2006   Past Surgical History:  Procedure Laterality Date   BACK SURGERY     BRAIN HEMATOMA EVACUATION  2006   LEFT HEART CATHETERIZATION WITH CORONARY ANGIOGRAM N/A 02/17/2014   Procedure: LEFT HEART CATHETERIZATION WITH CORONARY ANGIOGRAM;  Surgeon: [name], MD;  Location: [facility] CATH LAB;  Service: Cardiovascular;  Laterality: N/A;   LUMBAR DISC SURGERY  2013   PERCUTANEOUS CORONARY STENT INTERVENTION (PCI-S)  02/17/2014   Procedure: PERCUTANEOUS CORONARY STENT INTERVENTION (PCI-S);  Surgeon: [name], MD;  Location: [facility] CATH LAB;  Service: Cardiovascular;;   TRACHEOSTOMY  05/2004   VENTRICULOSTOMY  2006   Past Medical  History:  Diagnosis Date   Acid reflux    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   HENT: Negative.    Eyes: Negative.   Respiratory: Negative.    Cardiovascular: Negative.   Gastrointestinal: Negative.   Endocrine: Negative.   Genitourinary: Negative.   Musculoskeletal:  Positive for back pain.  Skin: Negative.   Allergic/Immunologic: Negative.   Neurological: Negative.   Hematological: Negative.   Psychiatric/Behavioral: Negative.        Objective:   Physical Exam Vitals and nursing note reviewed.  Constitutional:      Appearance: Normal appearance.  Cardiovascular:     Rate and Rhythm: Normal rate and regular rhythm.     Pulses: Normal pulses.     Heart sounds: Normal heart sounds.  Pulmonary:     Effort: Pulmonary effort is normal.     Breath sounds: Normal breath sounds.  Musculoskeletal:     Cervical back: Normal range of motion and neck supple.     Comments: Normal Muscle Bulk and Muscle Testing Reveals: Upper Extremities: Full ROM and Muscle Strength 5/5 Lumbar Paraspinal Tenderness: L-3-L-5 Lower Extremities: Full ROM and Muscle Strength 5/5 Arises from chair with ease Narrow Based  Gait     Skin:    General: Skin is warm and dry.  Neurological:     Mental Status: He is alert and oriented to person, place, and time.  Psychiatric:        Mood and Affect: Mood normal.        Behavior: Behavior normal.         Assessment & Plan:  1. History of multiple strokes with gait disorder, ongoing ataxia and spastic left hemiparesis: Continue  HEP as Tolerated. 01/25/2021 2. Chronic low back pain with documented facet arthropathy and history of intradural defect/schwanomma requiring resection, per Dr. 01/27/2021 Note.  Continue current medication regimen. Refilled: Percocet 10/325 one q6 prn #120. 01/25/2021  We will continue the opioid monitoring program, this consists of regular clinic visits, examinations, urine drug screen, pill counts as well as use of 01/27/2021 Controlled Substance Reporting system. A 12 month History has been reviewed on the [place] Controlled Substance Reporting System on 01/25/2021    F/U in 1 month

## 2021-02-07 ENCOUNTER — Ambulatory Visit: Payer: Medicaid Other | Admitting: Registered Nurse

## 2021-02-22 DIAGNOSIS — Z1211 Encounter for screening for malignant neoplasm of colon: Secondary | ICD-10-CM | POA: Diagnosis not present

## 2021-02-27 ENCOUNTER — Encounter: Payer: Self-pay | Admitting: Registered Nurse

## 2021-02-27 ENCOUNTER — Other Ambulatory Visit: Payer: Self-pay

## 2021-02-27 ENCOUNTER — Encounter: Payer: Medicaid Other | Attending: Physical Medicine & Rehabilitation | Admitting: Registered Nurse

## 2021-02-27 VITALS — BP 131/89 | HR 81 | Temp 98.8°F | Ht 67.0 in | Wt 205.0 lb

## 2021-02-27 DIAGNOSIS — Z5181 Encounter for therapeutic drug level monitoring: Secondary | ICD-10-CM | POA: Insufficient documentation

## 2021-02-27 DIAGNOSIS — R269 Unspecified abnormalities of gait and mobility: Secondary | ICD-10-CM | POA: Diagnosis not present

## 2021-02-27 DIAGNOSIS — M47816 Spondylosis without myelopathy or radiculopathy, lumbar region: Secondary | ICD-10-CM | POA: Insufficient documentation

## 2021-02-27 DIAGNOSIS — Z79891 Long term (current) use of opiate analgesic: Secondary | ICD-10-CM | POA: Insufficient documentation

## 2021-02-27 DIAGNOSIS — G8114 Spastic hemiplegia affecting left nondominant side: Secondary | ICD-10-CM | POA: Diagnosis not present

## 2021-02-27 DIAGNOSIS — G894 Chronic pain syndrome: Secondary | ICD-10-CM | POA: Insufficient documentation

## 2021-02-27 MED ORDER — OXYCODONE-ACETAMINOPHEN 10-325 MG PO TABS: 1.0000 | ORAL_TABLET | Freq: Four times a day (QID) | ORAL | 0 refills | Status: DC | PRN

## 2021-02-27 NOTE — Progress Notes (Signed)
Subjective:    Patient ID: George Villa, male    DOB: [birth year], 55 y.o.   MRN: [id number]  HPI: George Villa is a 54 y.o. male who returns for follow up appointment for chronic pain and medication refill. He states his pain is located in his lower back. He rates his pain 8. His current exercise regime is walking and riding his stationary bicycle for 30 minutes daily 5 days a week, he reports.   Mr. Parkman Morphine equivalent is 60.00 MME.   UDS ordered today.      Pain Inventory Average Pain 8 Pain Right Now 8 My pain is intermittent, constant, and dull  In the last 24 hours, has pain interfered with the following? General activity 7 Relation with others 7 Enjoyment of life 7 What TIME of day is your pain at its worst? night Sleep (in general) Fair  Pain is worse with: walking and standing Pain improves with: medication Relief from Meds: 9  Family History  Problem Relation Age of Onset   Heart Problems Father    Hypertension Mother    Stroke Other    Social History   Socioeconomic History   Marital status: Single    Spouse name: Not on file   Number of children: Not on file   Years of education: Not on file   Highest education level: Not on file  Occupational History   Occupation: umemployed     Comment: Works in [place] Use   Smoking status: Never   Smokeless tobacco: Never  Vaping Use   Vaping Use: Never used  Substance and Sexual Activity   Alcohol use: Yes    Alcohol/week: 6.0 standard drinks    Types: 6 Cans of beer per week    Comment: OCCASSIONALLY    Drug use: No   Sexual activity: Yes  Other Topics Concern   Not on file  Social History Narrative   7 cups of caffeine a week    Social Determinants of Health   Financial Resource Strain: Not on file  Food Insecurity: Not on file  Transportation Needs: Not on file  Physical Activity: Not on file  Stress: Not on file  Social Connections: Not on file   Past Surgical  History:  Procedure Laterality Date   [place]  2006   [place] N/A 02/17/2014   Procedure: [place];  Surgeon: Sinclair Grooms, MD;  Location: [facility] CATH LAB;  Service: Cardiovascular;  Laterality: N/A;   LUMBAR DISC SURGERY  2013   PERCUTANEOUS CORONARY STENT INTERVENTION (PCI-S)  02/17/2014   Procedure: PERCUTANEOUS CORONARY STENT INTERVENTION (PCI-S);  Surgeon: Sinclair Grooms, MD;  Location[facility] CATH LAB;  Service: Cardiovascular;;   TRACHEOSTOMY  05/2004   VENTRICULOSTOMY  2006   Past Surgical History:  Procedure Laterality Date   BACK SURGERY     BRAIN HEMATOMA EVACUATION  2006   LEFT HEART CATHETERIZATION WITH CORONARY ANGIOGRAM N/A 02/17/2014   Procedure: LEFT HEART CATHETERIZATION WITH CORONARY ANGIOGRAM;  Surgeon: Sinclair Grooms, MD;  Location: [facility] CATH LAB;  Service: Cardiovascular;  Laterality: N/A;   LUMBAR DISC SURGERY  2013   PERCUTANEOUS CORONARY STENT INTERVENTION (PCI-S)  02/17/2014   Procedure: PERCUTANEOUS CORONARY STENT INTERVENTION (PCI-S);  Surgeon: Sinclair Grooms, MD;  Location: [facility] CATH LAB;  Service: Cardiovascular;;   TRACHEOSTOMY  05/2004   VENTRICULOSTOMY  2006   Past Medical History:  Diagnosis Date   Acid reflux    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  All other systems reviewed and are negative.     Objective:   Physical Exam Vitals and nursing note reviewed.  Constitutional:      Appearance: Normal appearance.  Cardiovascular:     Rate and Rhythm: Normal rate and regular rhythm.     Pulses: Normal pulses.     Heart sounds: Normal heart sounds.  Pulmonary:     Effort: Pulmonary effort is normal.     Breath sounds: Normal breath sounds.  Musculoskeletal:     Cervical back: Normal range of motion and neck supple.     Comments: Normal Muscle Bulk and Muscle Testing Reveals:  Upper Extremities: Full ROM and Muscle Strength 5/5 Lumbar Paraspinal Tenderness: L-4-L-5 Lower Extremities: Full ROM and Muscle Strength 5/5 Abnormality Gait     Skin:    General: Skin is warm and dry.  Neurological:     Mental Status: He is alert and oriented to person, place, and time.  Psychiatric:        Mood and Affect: Mood normal.        Behavior: Behavior normal.         Assessment & Plan:  1. History of multiple strokes with gait disorder, ongoing ataxia and spastic left hemiparesis: Continue  HEP as Tolerated. 02/27/2021 2. Chronic low back pain with documented facet arthropathy and history of intradural defect/schwanomma requiring resection, per Dr. 03/01/2021 Note.  Continue current medication regimen. Refilled: Percocet 10/325 one q6 prn #120. 02/27/2021  We will continue the opioid monitoring program, this consists of regular clinic visits, examinations, urine drug screen, pill counts as well as use of 03/01/2021 Controlled Substance Reporting system. A 12 month History has been reviewed on the [place] Controlled Substance Reporting System on 02/27/2021    F/U in 1 month

## 2021-03-08 ENCOUNTER — Telehealth: Payer: Self-pay | Admitting: *Deleted

## 2021-03-08 LAB — TOXASSURE SELECT,+ANTIDEPR,UR

## 2021-03-08 NOTE — Telephone Encounter (Signed)
Urine drug screen for this encounter is consistent for prescribed medication 

## 2021-03-16 ENCOUNTER — Ambulatory Visit: Payer: Medicaid Other | Admitting: Registered Nurse

## 2021-03-29 ENCOUNTER — Encounter: Payer: Medicaid Other | Attending: Physical Medicine & Rehabilitation | Admitting: Registered Nurse

## 2021-03-29 ENCOUNTER — Encounter: Payer: Self-pay | Admitting: Registered Nurse

## 2021-03-29 ENCOUNTER — Other Ambulatory Visit: Payer: Self-pay

## 2021-03-29 VITALS — BP 127/86 | HR 96 | Temp 98.5°F | Ht 67.0 in | Wt 205.0 lb

## 2021-03-29 DIAGNOSIS — G8114 Spastic hemiplegia affecting left nondominant side: Secondary | ICD-10-CM | POA: Diagnosis not present

## 2021-03-29 DIAGNOSIS — Z5181 Encounter for therapeutic drug level monitoring: Secondary | ICD-10-CM | POA: Diagnosis not present

## 2021-03-29 DIAGNOSIS — Z79891 Long term (current) use of opiate analgesic: Secondary | ICD-10-CM | POA: Diagnosis not present

## 2021-03-29 DIAGNOSIS — M47816 Spondylosis without myelopathy or radiculopathy, lumbar region: Secondary | ICD-10-CM | POA: Diagnosis not present

## 2021-03-29 DIAGNOSIS — R269 Unspecified abnormalities of gait and mobility: Secondary | ICD-10-CM | POA: Diagnosis not present

## 2021-03-29 DIAGNOSIS — G894 Chronic pain syndrome: Secondary | ICD-10-CM

## 2021-03-29 MED ORDER — OXYCODONE-ACETAMINOPHEN 10-325 MG PO TABS: 1.0000 | ORAL_TABLET | Freq: Four times a day (QID) | ORAL | 0 refills | Status: DC | PRN

## 2021-03-29 NOTE — Progress Notes (Signed)
Subjective:    Patient ID: George Villa, male    DOB: November 06, 1965, 55 y.o.   MRN: [id number]  HPI: George Villa is a 55 y.o. male who returns for follow up appointment for chronic pain and medication refill. He states his pain is located in his lower back pain. He rates his pain 7. His current exercise regime is walking  and riding his stationary bicycle for 30 minutes 5 days a week.  George Villa equivalent is 60.00 MME.   Last UDS was Performed on 02/27/2021, it was consistent.     Pain Inventory Average Pain 7 Pain Right Now 7 My pain is dull  In the last 24 hours, has pain interfered with the following? General activity 7 Relation with others 7 Enjoyment of life 7 What TIME of day is your pain at its worst? night Sleep (in general) Fair  Pain is worse with: walking and standing Pain improves with: medication Relief from Meds: 8  Family History  Problem Relation Age of Onset   Heart Problems Father    Hypertension Mother    Stroke Other    Social History   Socioeconomic History   Marital status: Single    Spouse name: Not on file   Number of children: Not on file   Years of education: Not on file   Highest education level: Not on file  Occupational History   Occupation: umemployed     Comment: Works in [place] Use   Smoking status: Never   Smokeless tobacco: Never  Vaping Use   Vaping Use: Never used  Substance and Sexual Activity   Alcohol use: Yes    Alcohol/week: 6.0 standard drinks    Types: 6 Cans of beer per week    Comment: OCCASSIONALLY    Drug use: No   Sexual activity: Yes  Other Topics Concern   Not on file  Social History Narrative   7 cups of caffeine a week    Social Determinants of Health   Financial Resource Strain: Not on file  Food Insecurity: Not on file  Transportation Needs: Not on file  Physical Activity: Not on file  Stress: Not on file  Social Connections: Not on file   Past Surgical  History:  Procedure Laterality Date   [place]  2006   [place] N/A 02/17/2014   Procedure: [place];  Surgeon: Sinclair Grooms, MD;  Location: [facility] CATH LAB;  Service: Cardiovascular;  Laterality: N/A;   LUMBAR DISC SURGERY  2013   PERCUTANEOUS CORONARY STENT INTERVENTION (PCI-S)  02/17/2014   Procedure: PERCUTANEOUS CORONARY STENT INTERVENTION (PCI-S);  Surgeon: Sinclair Grooms, MD;  Location: [facility] CATH LAB;  Service: Cardiovascular;;   TRACHEOSTOMY  05/2004   VENTRICULOSTOMY  2006   Past Surgical History:  Procedure Laterality Date   BACK SURGERY     BRAIN HEMATOMA EVACUATION  2006   LEFT HEART CATHETERIZATION WITH CORONARY ANGIOGRAM N/A 02/17/2014   Procedure: LEFT HEART CATHETERIZATION WITH CORONARY ANGIOGRAM;  Surgeon: Sinclair Grooms, MD;  Location: [facility] CATH LAB;  Service: Cardiovascular;  Laterality: N/A;   LUMBAR DISC SURGERY  2013   PERCUTANEOUS CORONARY STENT INTERVENTION (PCI-S)  02/17/2014   Procedure: PERCUTANEOUS CORONARY STENT INTERVENTION (PCI-S);  Surgeon: Sinclair Grooms, MD;  Location: [facility] CATH LAB;  Service: Cardiovascular;;   TRACHEOSTOMY  05/2004   VENTRICULOSTOMY  2006   Past Medical History:  Diagnosis Date   Acid reflux    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   HENT: Negative.    Eyes: Negative.   Respiratory: Negative.    Cardiovascular: Negative.   Gastrointestinal: Negative.   Endocrine: Negative.   Genitourinary: Negative.   Musculoskeletal:  Positive for back pain and gait problem.  Skin: Negative.   Allergic/Immunologic: Negative.   Hematological: Negative.   Psychiatric/Behavioral: Negative.        Objective:   Physical Exam Vitals and nursing note reviewed.  Constitutional:      Appearance: Normal appearance.  Cardiovascular:     Rate and Rhythm: Normal rate and regular rhythm.     Pulses: Normal pulses.     Heart sounds: Normal heart sounds.  Pulmonary:     Effort: Pulmonary effort is normal.     Breath sounds: Normal breath sounds.  Musculoskeletal:     Cervical back: Normal range of motion and neck supple.     Comments: Normal Muscle Bulk and Muscle Testing Reveals:  Upper Extremities: Full ROM and Muscle Strength 5/5  Lumbar Paraspinal Tenderness: L-4-L-5 Lower Extremities: Full ROM and Muscle Strength 5/5 Arises from chair slowly  Antalgic Gait     Skin:    General: Skin is warm and dry.  Neurological:     Mental Status: He is alert and oriented to person, place, and time.  Psychiatric:        Mood and Affect: Mood normal.        Behavior: Behavior normal.         Assessment & Plan:  1. History of multiple strokes with gait disorder, ongoing ataxia and spastic left hemiparesis: Continue  HEP as Tolerated. 04/07/2021 2. Chronic low back pain with documented facet arthropathy and history of intradural defect/schwanomma requiring resection, per Dr. 04/09/2021 Note.  Continue current medication regimen. Refilled: Percocet 10/325 one q6 prn #120. 04/07/2021  We will continue the opioid monitoring program, this consists of regular clinic visits, examinations, urine drug screen, pill counts as  well as use of 04/09/2021 Controlled Substance Reporting system. A 12 month History has been reviewed on the [place] Controlled Substance Reporting System on 04/07/2021    F/U in 1 month

## 2021-03-30 ENCOUNTER — Ambulatory Visit: Payer: Medicaid Other | Admitting: Registered Nurse

## 2021-04-13 DIAGNOSIS — Z1159 Encounter for screening for other viral diseases: Secondary | ICD-10-CM | POA: Diagnosis not present

## 2021-04-13 DIAGNOSIS — Z20822 Contact with and (suspected) exposure to covid-19: Secondary | ICD-10-CM | POA: Diagnosis not present

## 2021-04-13 DIAGNOSIS — D539 Nutritional anemia, unspecified: Secondary | ICD-10-CM | POA: Diagnosis not present

## 2021-04-13 DIAGNOSIS — E559 Vitamin D deficiency, unspecified: Secondary | ICD-10-CM | POA: Diagnosis not present

## 2021-04-13 DIAGNOSIS — R7303 Prediabetes: Secondary | ICD-10-CM | POA: Diagnosis not present

## 2021-04-13 DIAGNOSIS — I10 Essential (primary) hypertension: Secondary | ICD-10-CM | POA: Diagnosis not present

## 2021-04-13 DIAGNOSIS — N183 Chronic kidney disease, stage 3 unspecified: Secondary | ICD-10-CM | POA: Diagnosis not present

## 2021-04-13 DIAGNOSIS — E789 Disorder of lipoprotein metabolism, unspecified: Secondary | ICD-10-CM | POA: Diagnosis not present

## 2021-04-27 NOTE — Progress Notes (Signed)
Subjective:    Patient ID: George Villa, male    DOB: [birth year], 56 y.o.   MRN: [id number]  HPI: George Villa is a 56 y.o. male who returns for follow up appointment for chronic pain and medication refill. He states his  pain is located in his lower back. He rates his pain 7. His current exercise regime is walking short distances with his cane and riding his stationary bicycle for 30 minutes daily.   Mr. Titman Morphine equivalent is 60.00 MME.   Last UDS was Performed on 02/27/2021, it was consistent.     Pain Inventory Average Pain 7 Pain Right Now 7 My pain is dull  In the last 24 hours, has pain interfered with the following? General activity 7 Relation with others 7 Enjoyment of life 7 What TIME of day is your pain at its worst? night Sleep (in general) Fair  Pain is worse with: walking and standing Pain improves with: medication Relief from Meds: 8  Family History  Problem Relation Age of Onset   Heart Problems Father    Hypertension Mother    Stroke Other    Social History   Socioeconomic History   Marital status: Single    Spouse name: Not on file   Number of children: Not on file   Years of education: Not on file   Highest education level: Not on file  Occupational History   Occupation: umemployed     Comment: Works in [employer]   Tobacco Use   Smoking status: Never   Smokeless tobacco: Never  Vaping Use   Vaping Use: Never used  Substance and Sexual Activity   Alcohol use: Yes    Alcohol/week: 6.0 standard drinks    Types: 6 Cans of beer per week    Comment: OCCASSIONALLY    Drug use: No   Sexual activity: Yes  Other Topics Concern   Not on file  Social History Narrative   7 cups of caffeine a week    Social Determinants of Health   Financial Resource Strain: Not on file  Food Insecurity: Not on file  Transportation Needs: Not on file  Physical Activity: Not on file  Stress: Not on file  Social Connections: Not on file    Past Surgical History:  Procedure Laterality Date   BACK SURGERY     BRAIN HEMATOMA EVACUATION  2006   LEFT HEART CATHETERIZATION WITH CORONARY ANGIOGRAM N/A 02/17/2014   Procedure: LEFT HEART CATHETERIZATION WITH CORONARY ANGIOGRAM;  Surgeon: [name], MD;  Location: [facility] CATH LAB;  Service: Cardiovascular;  Laterality: N/A;   LUMBAR DISC SURGERY  2013   PERCUTANEOUS CORONARY STENT INTERVENTION (PCI-S)  02/17/2014   Procedure: PERCUTANEOUS CORONARY STENT INTERVENTION (PCI-S);  Surgeon: [name], MD;  Location: [facility] CATH LAB;  Service: Cardiovascular;;   TRACHEOSTOMY  05/2004   VENTRICULOSTOMY  2006   Past Surgical History:  Procedure Laterality Date   BACK SURGERY     BRAIN HEMATOMA EVACUATION  2006   LEFT HEART CATHETERIZATION WITH CORONARY ANGIOGRAM N/A 02/17/2014   Procedure: LEFT HEART CATHETERIZATION WITH CORONARY ANGIOGRAM;  Surgeon: [name], MD;  Location: [facility] CATH LAB;  Service: Cardiovascular;  Laterality: N/A;   LUMBAR DISC SURGERY  2013   PERCUTANEOUS CORONARY STENT INTERVENTION (PCI-S)  02/17/2014   Procedure: PERCUTANEOUS CORONARY STENT INTERVENTION (PCI-S);  Surgeon: [name], MD;  Location: [facility] CATH LAB;  Service: Cardiovascular;;   TRACHEOSTOMY  05/2004  VENTRICULOSTOMY  2006   Past Medical History:  Diagnosis Date   Acid reflux    Cerebellar hemorrhage ([place])    a. 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[place]) 02/17/2014   George Villa 02/17/2014   Stroke ([place]) 2006   "left leg weak since" (02/18/2014)   BP 124/80    Pulse 93    Ht 5\' 7"  (1.702 m)    Wt 205 lb 12.8 oz (93.4 kg)    SpO2 97%    BMI 32.23 kg/m   Opioid Risk Score:   Fall Risk Score:  `1  Depression screen PHQ 2/9  Depression screen [facility] 2/9 03/29/2021 02/27/2021 01/25/2021 11/08/2020 10/11/2020 09/09/2020 08/15/2020  Decreased Interest 0 0 0 0 0  0 0  Down, Depressed, Hopeless 0 0 0 0 0 0 0  PHQ - 2 Score 0 0 0 0 0 0 0     Review of Systems  Musculoskeletal:  Positive for back pain and gait problem.  All other systems reviewed and are negative.     Objective:   Physical Exam Vitals and nursing note reviewed.  Constitutional:      Appearance: Normal appearance.  Cardiovascular:     Rate and Rhythm: Normal rate and regular rhythm.     Pulses: Normal pulses.     Heart sounds: Normal heart sounds.  Pulmonary:     Effort: Pulmonary effort is normal.     Breath sounds: Normal breath sounds.  Musculoskeletal:     Cervical back: Normal range of motion and neck supple.     Comments: Normal Muscle Bulk and Muscle Testing Reveals:  Upper Extremities: Full ROM and Muscle Strength 5/5  Lumbar Paraspinal Tenderness: L-4-L-5 Lower Extremities: Full ROM and Muscle Strength 5/5 Arises From chair slowly using cane for support Antalgic  Gait     Skin:    General: Skin is warm and dry.  Neurological:     Mental Status: He is alert and oriented to person, place, and time.  Psychiatric:        Mood and Affect: Mood normal.        Behavior: Behavior normal.         Assessment & Plan:  1. History of multiple strokes with gait disorder, ongoing ataxia and spastic left hemiparesis: Continue  HEP as Tolerated. 04/28/2021 2. Chronic low back pain with documented facet arthropathy and history of intradural defect/schwanomma requiring resection, per Dr. Naaman Plummer Note.  Continue current medication regimen. Refilled: Percocet 10/325 one q6 prn #120. 04/28/2021  We will continue the opioid monitoring program, this consists of regular clinic visits, examinations, urine drug screen, pill counts as well as use of [place] Controlled Substance Reporting system. A 12 month History has been reviewed on the [place] Controlled Substance Reporting System on 04/28/2021    F/U in 1 month

## 2021-04-28 ENCOUNTER — Encounter: Payer: Self-pay | Admitting: Registered Nurse

## 2021-04-28 ENCOUNTER — Encounter: Payer: Medicaid Other | Attending: Physical Medicine & Rehabilitation | Admitting: Registered Nurse

## 2021-04-28 ENCOUNTER — Other Ambulatory Visit: Payer: Self-pay

## 2021-04-28 VITALS — BP 124/80 | HR 93 | Ht 67.0 in | Wt 205.8 lb

## 2021-04-28 DIAGNOSIS — M47816 Spondylosis without myelopathy or radiculopathy, lumbar region: Secondary | ICD-10-CM | POA: Diagnosis not present

## 2021-04-28 DIAGNOSIS — G894 Chronic pain syndrome: Secondary | ICD-10-CM | POA: Diagnosis not present

## 2021-04-28 DIAGNOSIS — G8114 Spastic hemiplegia affecting left nondominant side: Secondary | ICD-10-CM | POA: Diagnosis not present

## 2021-04-28 DIAGNOSIS — Z5181 Encounter for therapeutic drug level monitoring: Secondary | ICD-10-CM | POA: Insufficient documentation

## 2021-04-28 DIAGNOSIS — R269 Unspecified abnormalities of gait and mobility: Secondary | ICD-10-CM | POA: Diagnosis not present

## 2021-04-28 DIAGNOSIS — Z79891 Long term (current) use of opiate analgesic: Secondary | ICD-10-CM | POA: Diagnosis not present

## 2021-04-28 MED ORDER — OXYCODONE-ACETAMINOPHEN 10-325 MG PO TABS: 1.0000 | ORAL_TABLET | Freq: Four times a day (QID) | ORAL | 0 refills | Status: DC | PRN

## 2021-05-26 ENCOUNTER — Encounter: Payer: Medicaid Other | Attending: Physical Medicine & Rehabilitation | Admitting: Registered Nurse

## 2021-05-26 ENCOUNTER — Encounter: Payer: Self-pay | Admitting: Registered Nurse

## 2021-05-26 ENCOUNTER — Other Ambulatory Visit: Payer: Self-pay

## 2021-05-26 VITALS — BP 136/89 | HR 96 | Temp 97.8°F | Ht 67.0 in | Wt 204.6 lb

## 2021-05-26 DIAGNOSIS — Z5181 Encounter for therapeutic drug level monitoring: Secondary | ICD-10-CM | POA: Insufficient documentation

## 2021-05-26 DIAGNOSIS — G8114 Spastic hemiplegia affecting left nondominant side: Secondary | ICD-10-CM | POA: Insufficient documentation

## 2021-05-26 DIAGNOSIS — Z79891 Long term (current) use of opiate analgesic: Secondary | ICD-10-CM | POA: Diagnosis not present

## 2021-05-26 DIAGNOSIS — R269 Unspecified abnormalities of gait and mobility: Secondary | ICD-10-CM | POA: Diagnosis not present

## 2021-05-26 DIAGNOSIS — G894 Chronic pain syndrome: Secondary | ICD-10-CM | POA: Insufficient documentation

## 2021-05-26 DIAGNOSIS — M47816 Spondylosis without myelopathy or radiculopathy, lumbar region: Secondary | ICD-10-CM | POA: Insufficient documentation

## 2021-05-26 MED ORDER — OXYCODONE-ACETAMINOPHEN 10-325 MG PO TABS: 1.0000 | ORAL_TABLET | Freq: Four times a day (QID) | ORAL | 0 refills | Status: DC | PRN

## 2021-05-26 NOTE — Progress Notes (Signed)
Subjective:    Patient ID: George Villa, male    DOB: 09/30/1965, 56 y.o.   MRN: [id number]  HPI: George Villa is a 56 y.o. male who returns for follow up appointment for chronic pain and medication refill. He states his pain is located in his lower back. He rates his pain 8. His current exercise regime is walking and riding his stationary bicycle 5 days a week for 30 minutes.   Mr. Blethen Morphine equivalent is 60.00 MME.   Last UDS was Performed on 02/27/2021, it was consistent.     Pain Inventory Average Pain 8 Pain Right Now 8 My pain is dull  In the last 24 hours, has pain interfered with the following? General activity 8 Relation with others 8 Enjoyment of life 8 What TIME of day is your pain at its worst? varies Sleep (in general) Fair  Pain is worse with: walking and standing Pain improves with: medication Relief from Meds: 9  Family History  Problem Relation Age of Onset   Heart Problems Father    Hypertension Mother    Stroke Other    Social History   Socioeconomic History   Marital status: Single    Spouse name: Not on file   Number of children: Not on file   Years of education: Not on file   Highest education level: Not on file  Occupational History   Occupation: umemployed     Comment: Works in [employer]   Tobacco Use   Smoking status: Never   Smokeless tobacco: Never  Vaping Use   Vaping Use: Never used  Substance and Sexual Activity   Alcohol use: Yes    Alcohol/week: 6.0 standard drinks    Types: 6 Cans of beer per week    Comment: OCCASSIONALLY    Drug use: No   Sexual activity: Yes  Other Topics Concern   Not on file  Social History Narrative   7 cups of caffeine a week    Social Determinants of Health   Financial Resource Strain: Not on file  Food Insecurity: Not on file  Transportation Needs: Not on file  Physical Activity: Not on file  Stress: Not on file  Social Connections: Not on file   Past Surgical History:   Procedure Laterality Date   BACK SURGERY     BRAIN HEMATOMA EVACUATION  2006   LEFT HEART CATHETERIZATION WITH CORONARY ANGIOGRAM N/A 02/17/2014   Procedure: LEFT HEART CATHETERIZATION WITH CORONARY ANGIOGRAM;  Surgeon: [name], MD;  Location[facility] CATH LAB;  Service: Cardiovascular;  Laterality: N/A;   LUMBAR DISC SURGERY  2013   PERCUTANEOUS CORONARY STENT INTERVENTION (PCI-S)  02/17/2014   Procedure: PERCUTANEOUS CORONARY STENT INTERVENTION (PCI-S);  Surgeon: [name], MD;  Location: [facility] CATH LAB;  Service: Cardiovascular;;   TRACHEOSTOMY  05/2004   VENTRICULOSTOMY  2006   Past Surgical History:  Procedure Laterality Date   BACK SURGERY     BRAIN HEMATOMA EVACUATION  2006   LEFT HEART CATHETERIZATION WITH CORONARY ANGIOGRAM N/A 02/17/2014   Procedure: LEFT HEART CATHETERIZATION WITH CORONARY ANGIOGRAM;  Surgeon: [name], MD;  Location: [facility] CATH LAB;  Service: Cardiovascular;  Laterality: N/A;   LUMBAR DISC SURGERY  2013   PERCUTANEOUS CORONARY STENT INTERVENTION (PCI-S)  02/17/2014   Procedure: PERCUTANEOUS CORONARY STENT INTERVENTION (PCI-S);  Surgeon: [name], MD;  Location[facility] CATH LAB;  Service: Cardiovascular;;   TRACHEOSTOMY  05/2004   VENTRICULOSTOMY  2006   Past Medical History:  Diagnosis Date   Acid reflux    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   HENT: Negative.    Eyes: Negative.   Respiratory: Negative.    Cardiovascular: Negative.   Gastrointestinal: Negative.   Endocrine: Negative.   Genitourinary: Negative.   Musculoskeletal:  Positive for back pain and gait problem.  Skin: Negative.   Allergic/Immunologic: Negative.   Hematological: Negative.   Psychiatric/Behavioral: Negative.    All other systems reviewed and are negative.     Objective:   Physical Exam Vitals and nursing note reviewed.  Constitutional:      Appearance: Normal appearance.  Cardiovascular:     Rate and Rhythm: Normal rate and regular rhythm.     Pulses: Normal pulses.     Heart sounds: Normal heart sounds.  Pulmonary:     Effort: Pulmonary effort is normal.     Breath sounds: Normal breath sounds.  Musculoskeletal:     Cervical back: Normal range of motion and neck supple.     Comments: Normal Muscle Bulk and Muscle Testing Reveals:  Upper Extremities: Full ROM and Muscle Strength 5/5  Lumbar Paraspinal Tenderness: L-4-L-5 Lower Extremities: Full ROM and Muscle Strength 5/5 Arises from Table Slowly using cane for support Narrow Based  Gait     Skin:    General: Skin is warm and dry.  Neurological:     Mental Status: He is alert and oriented to person, place, and time.  Psychiatric:        Mood and Affect: Mood normal.        Behavior: Behavior normal.         Assessment & Plan:  1. History of multiple strokes with gait disorder, ongoing ataxia and spastic left hemiparesis: Continue  HEP as Tolerated. 05/26/2021 2. Chronic low back pain with documented facet arthropathy and history of intradural defect/schwanomma requiring resection, per Dr. 05/28/2021 Note.  Continue current medication regimen. Refilled: Percocet 10/325 one q6 prn #120. 05/26/2021  We will continue the opioid monitoring program, this  consists of regular clinic visits, examinations, urine drug screen, pill counts as well as use of 05/28/2021 Controlled Substance Reporting system. A 12 month History has been reviewed on the [place] Controlled Substance Reporting System on 05/26/2021    F/U in 1 month

## 2021-06-23 ENCOUNTER — Encounter: Payer: Medicaid Other | Attending: Physical Medicine & Rehabilitation | Admitting: Registered Nurse

## 2021-06-23 ENCOUNTER — Other Ambulatory Visit: Payer: Self-pay

## 2021-06-23 VITALS — BP 139/88 | HR 87 | Ht 67.0 in | Wt 205.0 lb

## 2021-06-23 DIAGNOSIS — R269 Unspecified abnormalities of gait and mobility: Secondary | ICD-10-CM | POA: Diagnosis not present

## 2021-06-23 DIAGNOSIS — Z5181 Encounter for therapeutic drug level monitoring: Secondary | ICD-10-CM | POA: Insufficient documentation

## 2021-06-23 DIAGNOSIS — M47816 Spondylosis without myelopathy or radiculopathy, lumbar region: Secondary | ICD-10-CM | POA: Insufficient documentation

## 2021-06-23 DIAGNOSIS — Z79891 Long term (current) use of opiate analgesic: Secondary | ICD-10-CM | POA: Insufficient documentation

## 2021-06-23 DIAGNOSIS — G894 Chronic pain syndrome: Secondary | ICD-10-CM | POA: Diagnosis not present

## 2021-06-23 MED ORDER — OXYCODONE-ACETAMINOPHEN 10-325 MG PO TABS: 1.0000 | ORAL_TABLET | Freq: Four times a day (QID) | ORAL | 0 refills | Status: DC | PRN

## 2021-06-23 NOTE — Progress Notes (Signed)
? ?Subjective:  ? ? Patient ID: George Villa, male    DOB: October 24, 1965, 56 y.o.   MRN: [id number] ? ?HPI: George Villa is a 56 y.o. male who returns for follow up appointment for chronic pain and medication refill. He states his pain is located in his lower back. He rates his pain 8. His current exercise regime is walking and riding his stationary bicycle. ? ?[name] Morphine equivalent is  60.00 MME.   UDS ordered today.  ?  ? ?Pain Inventory ?Average Pain 7 ?Pain Right Now 8 ?My pain is dull ? ?In the last 24 hours, has pain interfered with the following? ?General activity 7 ?Relation with others 7 ?Enjoyment of life 7 ?What TIME of day is your pain at its worst? night ?Sleep (in general) Fair ? ?Pain is worse with: walking and standing ?Pain improves with: medication ?Relief from Meds: 9 ? ?Family History  ?Problem Relation Age of Onset  ? Heart Problems Father   ? Hypertension Mother   ? Stroke Other   ? ?Social History  ? ?Socioeconomic History  ? Marital status: Single  ?  Spouse name: Not on file  ? Number of children: Not on file  ? Years of education: Not on file  ? Highest education level: Not on file  ?Occupational History  ? Occupation: umemployed   ?  Comment: Works in [place]   ?Tobacco Use  ? Smoking status: Never  ? Smokeless tobacco: Never  ?Vaping Use  ? Vaping Use: Never used  ?Substance and Sexual Activity  ? Alcohol use: Yes  ?  Alcohol/week: 6.0 standard drinks  ?  Types: 6 Cans of beer per week  ?  Comment: OCCASSIONALLY   ? Drug use: No  ? Sexual activity: Yes  ?Other Topics Concern  ? Not on file  ?Social History Narrative  ? 7 cups of caffeine a week   ? ?Social Determinants of Health  ? ?Financial Resource Strain: Not on file  ?Food Insecurity: Not on file  ?Transportation Needs: Not on file  ?Physical Activity: Not on file  ?Stress: Not on file  ?Social Connections: Not on file  ? ?Past Surgical History:  ?Procedure Laterality Date  ? BACK SURGERY    ? BRAIN HEMATOMA  EVACUATION  2006  ? LEFT HEART CATHETERIZATION WITH CORONARY ANGIOGRAM N/A 02/17/2014  ? Procedure: LEFT HEART CATHETERIZATION WITH CORONARY ANGIOGRAM;  Surgeon: Sinclair Grooms, MD;  Location: [facility] CATH LAB;  Service: Cardiovascular;  Laterality: N/A;  ? [place] SURGERY  2013  ? PERCUTANEOUS CORONARY STENT INTERVENTION (PCI-S)  02/17/2014  ? Procedure: PERCUTANEOUS CORONARY STENT INTERVENTION (PCI-S);  Surgeon: Sinclair Grooms, MD;  Location: [facility] CATH LAB;  Service: Cardiovascular;;  ? TRACHEOSTOMY  05/2004  ? VENTRICULOSTOMY  2006  ? ?Past Surgical History:  ?Procedure Laterality Date  ? BACK SURGERY    ? BRAIN HEMATOMA EVACUATION  2006  ? LEFT HEART CATHETERIZATION WITH CORONARY ANGIOGRAM N/A 02/17/2014  ? Procedure: LEFT HEART CATHETERIZATION WITH CORONARY ANGIOGRAM;  Surgeon: Sinclair Grooms, MD;  Location: [facility] CATH LAB;  Service: Cardiovascular;  Laterality: N/A;  ? [place] SURGERY  2013  ? PERCUTANEOUS CORONARY STENT INTERVENTION (PCI-S)  02/17/2014  ? Procedure: PERCUTANEOUS CORONARY STENT INTERVENTION (PCI-S);  Surgeon: Sinclair Grooms, MD;  Location: [facility] CATH LAB;  Service: Cardiovascular;;  ? TRACHEOSTOMY  05/2004  ? VENTRICULOSTOMY  2006  ? ?Past Medical History:  ?Diagnosis Date  ? Acid reflux   ?  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 a. 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  ? Chronic lower back pain   ? Headache   ? "monthly" (02/18/2014)  ? Hypertension   ? 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 Archie Endo 02/17/2014  ? Stroke [facility]) 2006  ? "left leg weak since" (02/18/2014)  ? ?BP 139/88   Pulse 87   Ht 5\' 7"  (1.702 m)   Wt 205 lb (93 kg)   SpO2 96%   BMI 32.11 kg/m?  ? ?Opioid Risk Score:   ?Fall Risk Score:  `1 ? ?Depression screen PHQ 2/9 ? ?Depression screen [facility] 2/9 06/23/2021 05/26/2021 04/28/2021 03/29/2021 02/27/2021 01/25/2021 11/08/2020  ?Decreased Interest 0 0 0 0 0 0 0  ?Down, Depressed, Hopeless 0 0 0 0 0 0 0  ?PHQ - 2 Score 0 0 0 0 0 0 0  ?  ? ?Review of  Systems  ?Musculoskeletal:  Positive for back pain.  ?All other systems reviewed and are negative. ? ?   ?Objective:  ? Physical Exam ?Vitals and nursing note reviewed.  ?Constitutional:   ?   Appearance: Normal appearance.  ?Cardiovascular:  ?   Rate and Rhythm: Normal rate and regular rhythm.  ?   Pulses: Normal pulses.  ?   Heart sounds: Normal heart sounds.  ?Pulmonary:  ?   Effort: Pulmonary effort is normal.  ?   Breath sounds: Normal breath sounds.  ?Musculoskeletal:  ?   Cervical back: Normal range of motion and neck supple.  ?   Comments: Normal Muscle Bulk and Muscle Testing Reveals:  ?Upper Extremities: Right: Full ROM and Muscle Strength 5/5 ?Left Upper Extremity: Decreased ROM 90 Degrees and Muscle Strength 5/5 ?Lumbar Paraspinal Tenderness: L-3-L-5 ?Lower Extremities: Full ROM and Muscle Strength 5/5 ?Arises from Table slowly using cane for support ?Antalgic  Gait  ?   ?Skin: ?   General: Skin is warm and dry.  ?Neurological:  ?   Mental Status: He is alert and oriented to person, place, and time.  ?Psychiatric:     ?   Mood and Affect: Mood normal.     ?   Behavior: Behavior normal.  ? ? ? ? ?   ?Assessment & Plan:  ?1. History of multiple strokes with gait disorder, ongoing ataxia and spastic left hemiparesis: Continue  HEP as Tolerated. 06/23/2021 ?2. Chronic low back pain with documented facet arthropathy and history of intradural defect/schwanomma requiring resection, per Dr. Naaman Plummer Note.  Continue current medication regimen. ?Refilled: Percocet 10/325 one q6 prn #120. 06/23/2021 ? We will continue the opioid monitoring program, this consists of regular clinic visits, examinations, urine drug screen, pill counts as well as use of [place] Controlled Substance Reporting system. A 12 month History has been reviewed on the [place] Controlled Substance Reporting System on 06/23/2021  ?  ?F/U in 1 month ?  ?  ?  ? ?

## 2021-06-26 ENCOUNTER — Telehealth: Payer: Self-pay

## 2021-06-26 NOTE — Telephone Encounter (Signed)
Request Reference Number: [id number]. OXYCOD/APAP TAB 10-325MG  is approved through 12/27/2021. For further questions, call [employer] at [phone]. ?

## 2021-06-26 NOTE — Telephone Encounter (Signed)
PA for Oxycodone-APAP sent to insurance through CoverMyMeds ?

## 2021-06-28 ENCOUNTER — Encounter: Payer: Self-pay | Admitting: Registered Nurse

## 2021-06-29 LAB — TOXASSURE SELECT,+ANTIDEPR,UR

## 2021-07-05 ENCOUNTER — Telehealth: Payer: Self-pay | Admitting: *Deleted

## 2021-07-05 NOTE — Telephone Encounter (Signed)
Urine drug screen for this encounter is consistent for prescribed medication 

## 2021-07-21 ENCOUNTER — Ambulatory Visit: Payer: Medicaid Other | Admitting: Registered Nurse

## 2021-07-24 ENCOUNTER — Encounter: Payer: Medicaid Other | Admitting: Registered Nurse

## 2021-07-25 ENCOUNTER — Encounter: Payer: Medicaid Other | Attending: Physical Medicine & Rehabilitation | Admitting: Registered Nurse

## 2021-07-25 ENCOUNTER — Encounter: Payer: Self-pay | Admitting: Registered Nurse

## 2021-07-25 VITALS — BP 133/87 | HR 89 | Ht 67.0 in | Wt 199.4 lb

## 2021-07-25 DIAGNOSIS — M47816 Spondylosis without myelopathy or radiculopathy, lumbar region: Secondary | ICD-10-CM | POA: Diagnosis not present

## 2021-07-25 DIAGNOSIS — G894 Chronic pain syndrome: Secondary | ICD-10-CM | POA: Insufficient documentation

## 2021-07-25 DIAGNOSIS — Z79891 Long term (current) use of opiate analgesic: Secondary | ICD-10-CM | POA: Diagnosis not present

## 2021-07-25 DIAGNOSIS — Z5181 Encounter for therapeutic drug level monitoring: Secondary | ICD-10-CM | POA: Insufficient documentation

## 2021-07-25 DIAGNOSIS — R269 Unspecified abnormalities of gait and mobility: Secondary | ICD-10-CM | POA: Diagnosis not present

## 2021-07-25 MED ORDER — OXYCODONE-ACETAMINOPHEN 10-325 MG PO TABS: 1.0000 | ORAL_TABLET | Freq: Four times a day (QID) | ORAL | 0 refills | Status: DC | PRN

## 2021-07-25 NOTE — Progress Notes (Signed)
? ?Subjective:  ? ? Patient ID: George Villa, male    DOB: 1965-09-07, 56 y.o.   MRN: [id number] ? ?HPI: George Villa is a 56 y.o. male who returns for follow up appointment for chronic pain and medication refill. He states his pain is located in his lower back. He rates his pain 8. His current exercise regime is walking and performing stretching exercises. ? ?Ms. Pollok Morphine equivalent is 60.00 MME.   Last UDS was Performed  on 06/23/2021, it was consistent.  ?  ? ?Pain Inventory ?Average Pain 8 ?Pain Right Now 8 ?My pain is dull ? ?In the last 24 hours, has pain interfered with the following? ?General activity 7 ?Relation with others 7 ?Enjoyment of life 7 ?What TIME of day is your pain at its worst? night ?Sleep (in general) Fair ? ?Pain is worse with: walking and standing ?Pain improves with: rest and medication ?Relief from Meds: 9 ? ?Family History  ?Problem Relation Age of Onset  ? Heart Problems Father   ? Hypertension Mother   ? Stroke Other   ? ?Social History  ? ?Socioeconomic History  ? Marital status: Single  ?  Spouse name: Not on file  ? Number of children: Not on file  ? Years of education: Not on file  ? Highest education level: Not on file  ?Occupational History  ? Occupation: umemployed   ?  Comment: Works in [place]   ?Tobacco Use  ? Smoking status: Never  ? Smokeless tobacco: Never  ?Vaping Use  ? Vaping Use: Never used  ?Substance and Sexual Activity  ? Alcohol use: Yes  ?  Alcohol/week: 6.0 standard drinks  ?  Types: 6 Cans of beer per week  ?  Comment: OCCASSIONALLY   ? Drug use: No  ? Sexual activity: Yes  ?Other Topics Concern  ? Not on file  ?Social History Narrative  ? 7 cups of caffeine a week   ? ?Social Determinants of Health  ? ?Financial Resource Strain: Not on file  ?Food Insecurity: Not on file  ?Transportation Needs: Not on file  ?Physical Activity: Not on file  ?Stress: Not on file  ?Social Connections: Not on file  ? ?Past Surgical History:  ?Procedure  Laterality Date  ? BACK SURGERY    ? BRAIN HEMATOMA EVACUATION  2006  ? LEFT HEART CATHETERIZATION WITH CORONARY ANGIOGRAM N/A 02/17/2014  ? Procedure: LEFT HEART CATHETERIZATION WITH CORONARY ANGIOGRAM;  Surgeon: Sinclair Grooms, MD;  Location: [facility] CATH LAB;  Service: Cardiovascular;  Laterality: N/A;  ? [place] SURGERY  2013  ? PERCUTANEOUS CORONARY STENT INTERVENTION (PCI-S)  02/17/2014  ? Procedure: PERCUTANEOUS CORONARY STENT INTERVENTION (PCI-S);  Surgeon: Sinclair Grooms, MD;  Location[facility] CATH LAB;  Service: Cardiovascular;;  ? TRACHEOSTOMY  05/2004  ? VENTRICULOSTOMY  2006  ? ?Past Surgical History:  ?Procedure Laterality Date  ? BACK SURGERY    ? BRAIN HEMATOMA EVACUATION  2006  ? LEFT HEART CATHETERIZATION WITH CORONARY ANGIOGRAM N/A 02/17/2014  ? Procedure: LEFT HEART CATHETERIZATION WITH CORONARY ANGIOGRAM;  Surgeon: Sinclair Grooms, MD;  Location: [facility] CATH LAB;  Service: Cardiovascular;  Laterality: N/A;  ? [place] SURGERY  2013  ? PERCUTANEOUS CORONARY STENT INTERVENTION (PCI-S)  02/17/2014  ? Procedure: PERCUTANEOUS CORONARY STENT INTERVENTION (PCI-S);  Surgeon: Sinclair Grooms, MD;  Location: [facility] CATH LAB;  Service: Cardiovascular;;  ? TRACHEOSTOMY  05/2004  ? VENTRICULOSTOMY  2006  ? ?Past Medical History:  ?  Diagnosis Date  ? Acid reflux   ? 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 a. 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  ? Chronic lower back pain   ? Headache   ? "monthly" (02/18/2014)  ? Hypertension   ? 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 Archie Endo 02/17/2014  ? Stroke [facility]) 2006  ? "left leg weak since" (02/18/2014)  ? ?BP 133/87   Pulse 89   Ht 5\' 7"  (1.702 m)   Wt 199 lb 6.4 oz (90.4 kg)   SpO2 95%   BMI 31.23 kg/m?  ? ?Opioid Risk Score:   ?Fall Risk Score:  `1 ? ?Depression screen PHQ 2/9 ? ? ?  07/25/2021  ? 10:23 AM 06/23/2021  ? 11:19 AM 05/26/2021  ? 11:15 AM 04/28/2021  ? 11:09 AM 03/29/2021  ? 11:14 AM 02/27/2021  ? 11:12 AM 01/25/2021  ? 10:48 AM   ?Depression screen PHQ 2/9  ?Decreased Interest 0 0 0 0 0 0 0  ?Down, Depressed, Hopeless 0 0 0 0 0 0 0  ?PHQ - 2 Score 0 0 0 0 0 0 0  ?  ? ?Review of Systems  ?Constitutional: Negative.   ?HENT: Negative.    ?Eyes: Negative.   ?Respiratory: Negative.    ?Cardiovascular: Negative.   ?Gastrointestinal: Negative.   ?Endocrine: Negative.   ?Genitourinary: Negative.   ?Musculoskeletal:  Positive for gait problem.  ?Skin: Negative.   ?Allergic/Immunologic: Negative.   ?Hematological: Negative.   ?Psychiatric/Behavioral: Negative.    ? ?   ?Objective:  ? Physical Exam ?Vitals and nursing note reviewed.  ?Constitutional:   ?   Appearance: Normal appearance.  ?Cardiovascular:  ?   Rate and Rhythm: Normal rate and regular rhythm.  ?   Pulses: Normal pulses.  ?   Heart sounds: Normal heart sounds.  ?Pulmonary:  ?   Effort: Pulmonary effort is normal.  ?   Breath sounds: Normal breath sounds.  ?Musculoskeletal:  ?   Cervical back: Normal range of motion and neck supple.  ?   Comments: Normal Muscle Bulk and Muscle Testing Reveals:  ?Upper Extremities: Full ROM and Muscle Strength 5/5 ?Lumbar Paraspinal Tenderness: L-4-L-5 ?Lower Extremities: Full ROM and Muscle Strength 5/5 ?Arises from Chair slowly using cane for support ?Antalgic Gait  ?   ?Skin: ?   General: Skin is warm and dry.  ?Neurological:  ?   Mental Status: He is alert and oriented to person, place, and time.  ?Psychiatric:     ?   Mood and Affect: Mood normal.     ?   Behavior: Behavior normal.  ? ? ? ? ?   ?Assessment & Plan:  ?1. History of multiple strokes with gait disorder, ongoing ataxia and spastic left hemiparesis: Continue  HEP as Tolerated. 07/25/2021 ?2. Chronic low back pain with documented facet arthropathy and history of intradural defect/schwanomma requiring resection, per Dr. Naaman Plummer Note.  Continue current medication regimen. ?Refilled: Percocet 10/325 one q6 prn #120. 07/25/2021 ? We will continue the opioid monitoring program, this consists of  regular clinic visits, examinations, urine drug screen, pill counts as well as use of [place] Controlled Substance Reporting system. A 12 month History has been reviewed on the [place] Controlled Substance Reporting System on 07/25/2021  ?  ?F/U in 1 month ?  ? ? ? ? ? ? ? ? ? ? ?

## 2021-08-02 DIAGNOSIS — D539 Nutritional anemia, unspecified: Secondary | ICD-10-CM | POA: Diagnosis not present

## 2021-08-02 DIAGNOSIS — N183 Chronic kidney disease, stage 3 unspecified: Secondary | ICD-10-CM | POA: Diagnosis not present

## 2021-08-02 DIAGNOSIS — I10 Essential (primary) hypertension: Secondary | ICD-10-CM | POA: Diagnosis not present

## 2021-08-02 DIAGNOSIS — E559 Vitamin D deficiency, unspecified: Secondary | ICD-10-CM | POA: Diagnosis not present

## 2021-08-02 DIAGNOSIS — R7303 Prediabetes: Secondary | ICD-10-CM | POA: Diagnosis not present

## 2021-08-02 DIAGNOSIS — E789 Disorder of lipoprotein metabolism, unspecified: Secondary | ICD-10-CM | POA: Diagnosis not present

## 2021-08-21 ENCOUNTER — Encounter: Payer: Self-pay | Admitting: *Deleted

## 2021-08-21 NOTE — Telephone Encounter (Signed)
Opened in error

## 2021-08-25 ENCOUNTER — Ambulatory Visit: Payer: Medicaid Other | Admitting: Registered Nurse

## 2021-09-05 ENCOUNTER — Encounter: Payer: Medicaid Other | Attending: Physical Medicine & Rehabilitation | Admitting: Registered Nurse

## 2021-09-05 ENCOUNTER — Encounter: Payer: Self-pay | Admitting: Registered Nurse

## 2021-09-05 VITALS — BP 132/89 | HR 77 | Ht 67.0 in | Wt 203.6 lb

## 2021-09-05 DIAGNOSIS — R269 Unspecified abnormalities of gait and mobility: Secondary | ICD-10-CM

## 2021-09-05 DIAGNOSIS — Z79891 Long term (current) use of opiate analgesic: Secondary | ICD-10-CM | POA: Diagnosis not present

## 2021-09-05 DIAGNOSIS — Z5181 Encounter for therapeutic drug level monitoring: Secondary | ICD-10-CM | POA: Diagnosis not present

## 2021-09-05 DIAGNOSIS — G894 Chronic pain syndrome: Secondary | ICD-10-CM

## 2021-09-05 DIAGNOSIS — M47816 Spondylosis without myelopathy or radiculopathy, lumbar region: Secondary | ICD-10-CM | POA: Diagnosis not present

## 2021-09-05 DIAGNOSIS — G8114 Spastic hemiplegia affecting left nondominant side: Secondary | ICD-10-CM

## 2021-09-05 MED ORDER — OXYCODONE-ACETAMINOPHEN 10-325 MG PO TABS: 1.0000 | ORAL_TABLET | Freq: Four times a day (QID) | ORAL | 0 refills | Status: DC | PRN

## 2021-09-05 NOTE — Progress Notes (Signed)
Subjective:    Patient ID: George Villa, male    DOB: 05/01/1965, 56 y.o.   MRN: [id number]  HPI: George Villa is a 56 y.o. male who returns for follow up appointment for chronic pain and medication refill. He states his pain is located in his lower back. He rates his pain 8. His current exercise regime is walking and riding his stationary bicycle daily for 30 minutes.   George Villa Morphine equivalent is 60,00 MME.   Last UDS was Performed on 06/23/2021, it was consistent.     Pain Inventory Average Pain 8 Pain Right Now 8 My pain is dull  In the last 24 hours, has pain interfered with the following? General activity 7 Relation with others 7 Enjoyment of life 7 What TIME of day is your pain at its worst? evening Sleep (in general) Fair  Pain is worse with: walking and standing Pain improves with: medication Relief from Meds: 7  Family History  Problem Relation Age of Onset   Heart Problems Father    Hypertension Mother    Stroke Other    Social History   Socioeconomic History   Marital status: Single    Spouse name: Not on file   Number of children: Not on file   Years of education: Not on file   Highest education level: Not on file  Occupational History   Occupation: umemployed     Comment: Works in [employer] Use   Smoking status: Never   Smokeless tobacco: Never  Vaping Use   Vaping Use: Never used  Substance and Sexual Activity   Alcohol use: Yes    Alcohol/week: 6.0 standard drinks    Types: 6 Cans of beer per week    Comment: OCCASSIONALLY    Drug use: No   Sexual activity: Yes  Other Topics Concern   Not on file  Social History Narrative   7 cups of caffeine a week    Social Determinants of Health   Financial Resource Strain: Not on file  Food Insecurity: Not on file  Transportation Needs: Not on file  Physical Activity: Not on file  Stress: Not on file  Social Connections: Not on file   Past Surgical History:   Procedure Laterality Date   [place]  2006   [place] N/A 02/17/2014   Procedure: [place];  Surgeon: Sinclair Grooms, MD;  Location: [facility] CATH LAB;  Service: Cardiovascular;  Laterality: N/A;   LUMBAR DISC SURGERY  2013   PERCUTANEOUS CORONARY STENT INTERVENTION (PCI-S)  02/17/2014   Procedure: PERCUTANEOUS CORONARY STENT INTERVENTION (PCI-S);  Surgeon: Sinclair Grooms, MD;  Location: [facility] CATH LAB;  Service: Cardiovascular;;   TRACHEOSTOMY  05/2004   VENTRICULOSTOMY  2006   Past Surgical History:  Procedure Laterality Date   BACK SURGERY     BRAIN HEMATOMA EVACUATION  2006   LEFT HEART CATHETERIZATION WITH CORONARY ANGIOGRAM N/A 02/17/2014   Procedure: LEFT HEART CATHETERIZATION WITH CORONARY ANGIOGRAM;  Surgeon: Sinclair Grooms, MD;  Location: [facility] CATH LAB;  Service: Cardiovascular;  Laterality: N/A;   LUMBAR DISC SURGERY  2013   PERCUTANEOUS CORONARY STENT INTERVENTION (PCI-S)  02/17/2014   Procedure: PERCUTANEOUS CORONARY STENT INTERVENTION (PCI-S);  Surgeon: Sinclair Grooms, MD;  Location: [facility] CATH LAB;  Service: Cardiovascular;;   TRACHEOSTOMY  05/2004   VENTRICULOSTOMY  2006  Past Medical History:  Diagnosis Date   Acid reflux    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   Archie Endo 02/17/2014   Stroke ([place]) 2006   "left leg weak since" (02/18/2014)   BP 132/89   Pulse 77   Ht 5\' 7"  (1.702 m)   Wt 203 lb 9.6 oz (92.4 kg)   SpO2 96%   BMI 31.89 kg/m   Opioid Risk Score:   Fall Risk Score:  `1  Depression screen [facility] 2/9     09/05/2021   10:05 AM 07/25/2021   10:23 AM 06/23/2021   11:19 AM 05/26/2021   11:15 AM 04/28/2021   11:09 AM 03/29/2021   11:14 AM 02/27/2021    11:12 AM  Depression screen PHQ 2/9  Decreased Interest 0 0 0 0 0 0 0  Down, Depressed, Hopeless 0 0 0 0 0 0 0  PHQ - 2 Score 0 0 0 0 0 0 0     Review of Systems  Constitutional: Negative.   HENT: Negative.    Eyes: Negative.   Respiratory: Negative.    Cardiovascular: Negative.   Gastrointestinal: Negative.   Endocrine: Negative.   Genitourinary: Negative.   Musculoskeletal:  Positive for back pain and gait problem.  Skin: Negative.   Allergic/Immunologic: Negative.   Hematological: Negative.   Psychiatric/Behavioral: Negative.        Objective:   Physical Exam Vitals and nursing note reviewed.  Constitutional:      Appearance: Normal appearance.  Cardiovascular:     Rate and Rhythm: Normal rate and regular rhythm.     Pulses: Normal pulses.     Heart sounds: Normal heart sounds.  Pulmonary:     Effort: Pulmonary effort is normal.     Breath sounds: Normal breath sounds.  Musculoskeletal:     Cervical back: Normal range of motion and neck supple.     Comments: Normal Muscle Bulk and Muscle Testing Reveals:  Upper Extremities: Full ROM and Muscle Strength  5/5  Lumbar Paraspinal Tenderness: L-4-L-5 Lower Extremities: Full ROM and Muscle Strength 5/5 Arises from Chair slowly using cane for support Antalgic  Gait     Skin:    General: Skin is warm and dry.  Neurological:     Mental Status: He is alert and oriented to person, place, and time.  Psychiatric:        Mood and Affect: Mood normal.        Behavior: Behavior normal.         Assessment & Plan:  1. History of multiple strokes with gait disorder, ongoing ataxia and spastic left hemiparesis: Continue  HEP as Tolerated. 09/05/2021 2. Chronic low back pain with documented facet arthropathy and history of intradural defect/schwanomma requiring resection, per Dr. Naaman Plummer Note.  Continue current medication regimen. Refilled: Percocet 10/325 one q6 prn #120. 09/05/2021  We will continue the opioid monitoring  program, this consists of regular clinic visits, examinations, urine drug screen, pill counts as well as use of [place] Controlled Substance Reporting system. A 12 month History has been reviewed on the [place] Controlled Substance Reporting System on 05/30/ /2023    F/U in 1 month

## 2021-09-19 ENCOUNTER — Telehealth: Payer: Self-pay

## 2021-09-19 DIAGNOSIS — M47816 Spondylosis without myelopathy or radiculopathy, lumbar region: Secondary | ICD-10-CM

## 2021-09-19 MED ORDER — OXYCODONE-ACETAMINOPHEN 10-325 MG PO TABS: 1.0000 | ORAL_TABLET | Freq: Four times a day (QID) | ORAL | 0 refills | Status: DC | PRN

## 2021-09-19 NOTE — Telephone Encounter (Signed)
Patient called stating that [facility] is out of stock of Oxycodone/APAP but [facility] does. Can you resend prescription to George Villa? Prescription cancelled at [facility]

## 2021-09-19 NOTE — Telephone Encounter (Signed)
PMP was Reviewed.  Oxycodone e-scribed today.  George Villa is aware of the above.

## 2021-10-18 ENCOUNTER — Encounter: Payer: Medicaid Other | Attending: Physical Medicine & Rehabilitation | Admitting: Registered Nurse

## 2021-10-18 ENCOUNTER — Encounter: Payer: Self-pay | Admitting: Registered Nurse

## 2021-10-18 VITALS — BP 144/98 | HR 73 | Ht 67.0 in | Wt 199.8 lb

## 2021-10-18 DIAGNOSIS — R269 Unspecified abnormalities of gait and mobility: Secondary | ICD-10-CM

## 2021-10-18 DIAGNOSIS — M47816 Spondylosis without myelopathy or radiculopathy, lumbar region: Secondary | ICD-10-CM | POA: Diagnosis not present

## 2021-10-18 DIAGNOSIS — Z5181 Encounter for therapeutic drug level monitoring: Secondary | ICD-10-CM | POA: Diagnosis not present

## 2021-10-18 DIAGNOSIS — Z79891 Long term (current) use of opiate analgesic: Secondary | ICD-10-CM

## 2021-10-18 DIAGNOSIS — G894 Chronic pain syndrome: Secondary | ICD-10-CM | POA: Diagnosis not present

## 2021-10-18 DIAGNOSIS — G8114 Spastic hemiplegia affecting left nondominant side: Secondary | ICD-10-CM | POA: Diagnosis not present

## 2021-10-18 MED ORDER — OXYCODONE-ACETAMINOPHEN 10-325 MG PO TABS: 1.0000 | ORAL_TABLET | Freq: Four times a day (QID) | ORAL | 0 refills | Status: DC | PRN

## 2021-10-18 NOTE — Progress Notes (Signed)
Subjective:    Patient ID: George Villa, male    DOB: May 26, 1965, 56 y.o.   MRN: [id number]  HPI: George Villa is a 56 y.o. male who returns for follow up appointment for chronic pain and medication refill. He states his pain is located in his lower back. He rates his pain 7. His current exercise regime is walking, riding his stationary bike for 30 minutes 5 days a week and performing stretching exercises.  Mr. Orsak Morphine equivalent is 60.00 MME.   Last UDS was Performed on 06/23/2021, it was consistent.     Pain Inventory Average Pain 8 Pain Right Now 7 My pain is dull  In the last 24 hours, has pain interfered with the following? General activity 8 Relation with others 8 Enjoyment of life 8 What TIME of day is your pain at its worst? night Sleep (in general) Fair  Pain is worse with: walking and standing Pain improves with: medication Relief from Meds: 9  Family History  Problem Relation Age of Onset   Heart Problems Father    Hypertension Mother    Stroke Other    Social History   Socioeconomic History   Marital status: Single    Spouse name: Not on file   Number of children: Not on file   Years of education: Not on file   Highest education level: Not on file  Occupational History   Occupation: umemployed     Comment: Works in [employer]   Tobacco Use   Smoking status: Never   Smokeless tobacco: Never  Vaping Use   Vaping Use: Never used  Substance and Sexual Activity   Alcohol use: Yes    Alcohol/week: 6.0 standard drinks of alcohol    Types: 6 Cans of beer per week    Comment: OCCASSIONALLY    Drug use: No   Sexual activity: Yes  Other Topics Concern   Not on file  Social History Narrative   7 cups of caffeine a week    Social Determinants of Health   Financial Resource Strain: Not on file  Food Insecurity: Not on file  Transportation Needs: Not on file  Physical Activity: Not on file  Stress: Not on file  Social Connections:  Not on file   Past Surgical History:  Procedure Laterality Date   BACK SURGERY     BRAIN HEMATOMA EVACUATION  2006   LEFT HEART CATHETERIZATION WITH CORONARY ANGIOGRAM N/A 02/17/2014   Procedure: LEFT HEART CATHETERIZATION WITH CORONARY ANGIOGRAM;  Surgeon: [name], MD;  Location: [facility] CATH LAB;  Service: Cardiovascular;  Laterality: N/A;   LUMBAR DISC SURGERY  2013   PERCUTANEOUS CORONARY STENT INTERVENTION (PCI-S)  02/17/2014   Procedure: PERCUTANEOUS CORONARY STENT INTERVENTION (PCI-S);  Surgeon: [name], MD;  Location: [facility] CATH LAB;  Service: Cardiovascular;;   TRACHEOSTOMY  05/2004   VENTRICULOSTOMY  2006   Past Surgical History:  Procedure Laterality Date   BACK SURGERY     BRAIN HEMATOMA EVACUATION  2006   LEFT HEART CATHETERIZATION WITH CORONARY ANGIOGRAM N/A 02/17/2014   Procedure: LEFT HEART CATHETERIZATION WITH CORONARY ANGIOGRAM;  Surgeon: [name], MD;  Location: [facility] CATH LAB;  Service: Cardiovascular;  Laterality: N/A;   LUMBAR DISC SURGERY  2013   PERCUTANEOUS CORONARY STENT INTERVENTION (PCI-S)  02/17/2014   Procedure: PERCUTANEOUS CORONARY STENT INTERVENTION (PCI-S);  Surgeon: [name], MD;  Location: [facility] CATH LAB;  Service: Cardiovascular;;   TRACHEOSTOMY  05/2004  VENTRICULOSTOMY  2006   Past Medical History:  Diagnosis Date   Acid reflux    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   [name] 02/17/2014   Stroke ([facility]) 2006   "left leg weak since" (02/18/2014)   BP (!) 150/99   Pulse 71   Ht 5\' 7"  (1.702 m)   Wt 199 lb 12.8 oz (90.6 kg)   SpO2 97%   BMI 31.29 kg/m   Opioid Risk Score:   Fall Risk Score:  `1  Depression screen [facility] 2/9     10/18/2021    9:25 AM 09/05/2021   10:05 AM 07/25/2021   10:23 AM 06/23/2021   11:19 AM 05/26/2021   11:15  AM 04/28/2021   11:09 AM 03/29/2021   11:14 AM  Depression screen PHQ 2/9  Decreased Interest 0 0 0 0 0 0 0  Down, Depressed, Hopeless 0 0 0 0 0 0 0  PHQ - 2 Score 0 0 0 0 0 0 0     Review of Systems  Constitutional: Negative.   HENT: Negative.    Eyes: Negative.   Respiratory: Negative.    Cardiovascular: Negative.   Gastrointestinal: Negative.   Endocrine: Negative.   Genitourinary: Negative.   Musculoskeletal:  Positive for back pain and gait problem.  Skin: Negative.   Allergic/Immunologic: Negative.   Hematological: Negative.   Psychiatric/Behavioral: Negative.        Objective:   Physical Exam Vitals and nursing note reviewed.  Constitutional:      Appearance: Normal appearance.  Cardiovascular:     Rate and Rhythm: Normal rate and regular rhythm.     Pulses: Normal pulses.     Heart sounds: Normal heart sounds.  Pulmonary:     Effort: Pulmonary effort is normal.     Breath sounds: Normal breath sounds.  Musculoskeletal:     Cervical back: Normal range of motion and neck supple.     Comments: Normal Muscle Bulk and Muscle Testing Reveals:  Upper Extremities: Full  ROM and Muscle Strength 5/5  Lumbar Paraspinal Tenderness: L-3-L-5 Lower Extremities : Full ROM and Muscle Strength 5/5 Arises from Table Slowly using cane for support Antalgic Gait     Skin:    General: Skin is warm and dry.  Neurological:     Mental Status: He is alert and oriented to person, place, and time.  Psychiatric:        Mood and Affect: Mood normal.        Behavior: Behavior normal.         Assessment & Plan:  1. History of multiple strokes with gait disorder, ongoing ataxia and spastic left hemiparesis: Continue  HEP as Tolerated. 10/18/2021 2. Chronic low back pain with documented facet arthropathy and history of intradural defect/schwanomma requiring resection, per Dr. 12/19/2021 Note.  Continue current medication regimen. Refilled: Percocet 10/325 one q6 prn #120. 10/18/2021  We  will continue the opioid monitoring program, this consists of regular clinic visits, examinations, urine drug screen, pill counts as well as use of 12[place]/20[place] Controlled Substance Reporting system. A 12 month History has been reviewed on the [place] Controlled Substance Reporting System on 07/12/ /2023    F/U in 1 month

## 2021-11-08 DIAGNOSIS — I10 Essential (primary) hypertension: Secondary | ICD-10-CM | POA: Diagnosis not present

## 2021-11-08 DIAGNOSIS — R030 Elevated blood-pressure reading, without diagnosis of hypertension: Secondary | ICD-10-CM | POA: Diagnosis not present

## 2021-11-08 DIAGNOSIS — E559 Vitamin D deficiency, unspecified: Secondary | ICD-10-CM | POA: Diagnosis not present

## 2021-11-08 DIAGNOSIS — I639 Cerebral infarction, unspecified: Secondary | ICD-10-CM | POA: Diagnosis not present

## 2021-11-08 DIAGNOSIS — R7303 Prediabetes: Secondary | ICD-10-CM | POA: Diagnosis not present

## 2021-11-08 DIAGNOSIS — N529 Male erectile dysfunction, unspecified: Secondary | ICD-10-CM | POA: Diagnosis not present

## 2021-11-08 DIAGNOSIS — K219 Gastro-esophageal reflux disease without esophagitis: Secondary | ICD-10-CM | POA: Diagnosis not present

## 2021-11-08 DIAGNOSIS — N401 Enlarged prostate with lower urinary tract symptoms: Secondary | ICD-10-CM | POA: Diagnosis not present

## 2021-11-08 DIAGNOSIS — E789 Disorder of lipoprotein metabolism, unspecified: Secondary | ICD-10-CM | POA: Diagnosis not present

## 2021-11-08 DIAGNOSIS — D539 Nutritional anemia, unspecified: Secondary | ICD-10-CM | POA: Diagnosis not present

## 2021-11-08 DIAGNOSIS — R351 Nocturia: Secondary | ICD-10-CM | POA: Diagnosis not present

## 2021-11-08 DIAGNOSIS — N183 Chronic kidney disease, stage 3 unspecified: Secondary | ICD-10-CM | POA: Diagnosis not present

## 2021-11-13 ENCOUNTER — Encounter: Payer: Medicaid Other | Attending: Physical Medicine & Rehabilitation | Admitting: Registered Nurse

## 2021-11-13 ENCOUNTER — Encounter: Payer: Self-pay | Admitting: Registered Nurse

## 2021-11-13 VITALS — BP 137/89 | HR 78 | Ht 67.0 in | Wt 198.0 lb

## 2021-11-13 DIAGNOSIS — M47816 Spondylosis without myelopathy or radiculopathy, lumbar region: Secondary | ICD-10-CM

## 2021-11-13 DIAGNOSIS — Z79891 Long term (current) use of opiate analgesic: Secondary | ICD-10-CM | POA: Diagnosis not present

## 2021-11-13 DIAGNOSIS — R269 Unspecified abnormalities of gait and mobility: Secondary | ICD-10-CM | POA: Diagnosis not present

## 2021-11-13 DIAGNOSIS — G894 Chronic pain syndrome: Secondary | ICD-10-CM

## 2021-11-13 DIAGNOSIS — Z5181 Encounter for therapeutic drug level monitoring: Secondary | ICD-10-CM | POA: Diagnosis not present

## 2021-11-13 MED ORDER — OXYCODONE-ACETAMINOPHEN 10-325 MG PO TABS: 1.0000 | ORAL_TABLET | Freq: Four times a day (QID) | ORAL | 0 refills | Status: DC | PRN

## 2021-11-13 NOTE — Progress Notes (Signed)
Subjective:    Patient ID: George Villa, male    DOB: 08/17/1965, 56 y.o.   MRN: [id number]  HPI: George Villa is a 56 y.o. male who returns for follow up appointment for chronic pain and medication refill. He states his  pain is located in his lower back. He rates his pain 8. His current exercise regime is walking and performing stretching exercises  George Villa Morphine equivalent is 40.00 MME.   UDS ordered today.     Pain Inventory Average Pain 8 Pain Right Now 8 My pain is dull  In the last 24 hours, has pain interfered with the following? General activity 7 Relation with others 7 Enjoyment of life 7 What TIME of day is your pain at its worst? night Sleep (in general) Good  Pain is worse with: walking and standing Pain improves with: medication Relief from Meds: 8  Family History  Problem Relation Age of Onset   Heart Problems Father    Hypertension Mother    Stroke Other    Social History   Socioeconomic History   Marital status: Single    Spouse name: Not on file   Number of children: Not on file   Years of education: Not on file   Highest education level: Not on file  Occupational History   Occupation: umemployed     Comment: Works in [employer]   Tobacco Use   Smoking status: Never   Smokeless tobacco: Never  Vaping Use   Vaping Use: Never used  Substance and Sexual Activity   Alcohol use: Yes    Alcohol/week: 6.0 standard drinks of alcohol    Types: 6 Cans of beer per week    Comment: OCCASSIONALLY    Drug use: No   Sexual activity: Yes  Other Topics Concern   Not on file  Social History Narrative   7 cups of caffeine a week    Social Determinants of Health   Financial Resource Strain: Not on file  Food Insecurity: Not on file  Transportation Needs: Not on file  Physical Activity: Not on file  Stress: Not on file  Social Connections: Not on file   Past Surgical History:  Procedure Laterality Date   BACK SURGERY     BRAIN  HEMATOMA EVACUATION  2006   LEFT HEART CATHETERIZATION WITH CORONARY ANGIOGRAM N/A 02/17/2014   Procedure: LEFT HEART CATHETERIZATION WITH CORONARY ANGIOGRAM;  Surgeon: [name], MD;  Location: [facility] CATH LAB;  Service: Cardiovascular;  Laterality: N/A;   LUMBAR DISC SURGERY  2013   PERCUTANEOUS CORONARY STENT INTERVENTION (PCI-S)  02/17/2014   Procedure: PERCUTANEOUS CORONARY STENT INTERVENTION (PCI-S);  Surgeon: [name], MD;  Location: [facility] CATH LAB;  Service: Cardiovascular;;   TRACHEOSTOMY  05/2004   VENTRICULOSTOMY  2006   Past Surgical History:  Procedure Laterality Date   BACK SURGERY     BRAIN HEMATOMA EVACUATION  2006   LEFT HEART CATHETERIZATION WITH CORONARY ANGIOGRAM N/A 02/17/2014   Procedure: LEFT HEART CATHETERIZATION WITH CORONARY ANGIOGRAM;  Surgeon: [name], MD;  Location: [facility] CATH LAB;  Service: Cardiovascular;  Laterality: N/A;   LUMBAR DISC SURGERY  2013   PERCUTANEOUS CORONARY STENT INTERVENTION (PCI-S)  02/17/2014   Procedure: PERCUTANEOUS CORONARY STENT INTERVENTION (PCI-S);  Surgeon: [name], MD;  Location: [facility] CATH LAB;  Service: Cardiovascular;;   TRACHEOSTOMY  05/2004   VENTRICULOSTOMY  2006   Past Medical History:  Diagnosis Date   Acid  reflux    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   [name] 02/17/2014   Stroke ([facility]) 2006   "left leg weak since" (02/18/2014)   BP 137/89   Pulse 78   Ht 5\' 7"  (1.702 m)   Wt 198 lb (89.8 kg)   SpO2 94%   BMI 31.01 kg/m   Opioid Risk Score:   Fall Risk Score:  `1  Depression screen [facility] 2/9     11/13/2021    9:57 AM 10/18/2021    9:25 AM 09/05/2021   10:05 AM 07/25/2021   10:23 AM 06/23/2021   11:19 AM 05/26/2021   11:15 AM 04/28/2021   11:09 AM  Depression screen PHQ 2/9  Decreased Interest 0 0 0 0 0  0 0  Down, Depressed, Hopeless 0 0 0 0 0 0 0  PHQ - 2 Score 0 0 0 0 0 0 0     Review of Systems  Musculoskeletal:  Positive for back pain.  All other systems reviewed and are negative.      Objective:   Physical Exam Vitals and nursing note reviewed.  Constitutional:      Appearance: Normal appearance.  Cardiovascular:     Rate and Rhythm: Normal rate and regular rhythm.     Pulses: Normal pulses.     Heart sounds: Normal heart sounds.  Pulmonary:     Effort: Pulmonary effort is normal.     Breath sounds: Normal breath sounds.  Musculoskeletal:     Cervical back: Normal range of motion and neck supple.     Comments: Normal Muscle Bulk and Muscle Testing Reveals:  Upper Extremities: Full ROM and Muscle Strength 5/5  Lumbar Paraspinal Tenderness: L-4-L-5 Lower Extremities: Full ROM and Muscle Strength 5/5 Arises from Table slowly using cane for support Abnormality  Gait     Skin:    General: Skin is warm and dry.  Neurological:     Mental Status: He is alert and oriented to person, place, and time.  Psychiatric:        Mood and Affect: Mood normal.        Behavior: Behavior normal.         Assessment & Plan:  1. History of multiple strokes with gait disorder, ongoing ataxia and spastic left hemiparesis: Continue  HEP as Tolerated. 11/13/2021 2. Chronic low back pain with documented facet arthropathy and history of intradural defect/schwanomma requiring resection, per Dr. 01/13/2022 Note.  Continue current medication regimen. Refilled: Percocet 10/325 one q6 prn #120. 11/13/2021  We will continue the opioid monitoring program, this consists of regular clinic visits, examinations, urine drug screen, pill counts as well as use of 01/13/2022 Controlled Substance Reporting system. A 12 month History has been reviewed on the [place] Controlled Substance Reporting System on 11/13/2021    F/U in 1 month

## 2021-11-17 LAB — TOXASSURE SELECT,+ANTIDEPR,UR

## 2021-11-20 ENCOUNTER — Telehealth: Payer: Self-pay | Admitting: *Deleted

## 2021-11-20 NOTE — Telephone Encounter (Signed)
Urine drug screen for this encounter is consistent for prescribed medication 

## 2021-12-14 ENCOUNTER — Telehealth: Payer: Self-pay | Admitting: *Deleted

## 2021-12-14 ENCOUNTER — Encounter: Payer: Self-pay | Admitting: Registered Nurse

## 2021-12-14 ENCOUNTER — Encounter: Payer: Medicare Other | Attending: Physical Medicine & Rehabilitation | Admitting: Registered Nurse

## 2021-12-14 VITALS — BP 133/89 | HR 70 | Wt 201.0 lb

## 2021-12-14 DIAGNOSIS — Z5181 Encounter for therapeutic drug level monitoring: Secondary | ICD-10-CM

## 2021-12-14 DIAGNOSIS — G8114 Spastic hemiplegia affecting left nondominant side: Secondary | ICD-10-CM | POA: Diagnosis not present

## 2021-12-14 DIAGNOSIS — Z79891 Long term (current) use of opiate analgesic: Secondary | ICD-10-CM | POA: Diagnosis not present

## 2021-12-14 DIAGNOSIS — R269 Unspecified abnormalities of gait and mobility: Secondary | ICD-10-CM

## 2021-12-14 DIAGNOSIS — G894 Chronic pain syndrome: Secondary | ICD-10-CM

## 2021-12-14 DIAGNOSIS — M47816 Spondylosis without myelopathy or radiculopathy, lumbar region: Secondary | ICD-10-CM

## 2021-12-14 MED ORDER — OXYCODONE-ACETAMINOPHEN 10-325 MG PO TABS: 1.0000 | ORAL_TABLET | Freq: Four times a day (QID) | ORAL | 0 refills | Status: DC | PRN

## 2021-12-14 NOTE — Telephone Encounter (Signed)
Request Reference Number: [id number]. OXYCOD/APAP TAB 10-325MG  is approved through 06/14/2022. For further questions, call [employer] at [phone]. Mr Tutterow notified.

## 2021-12-14 NOTE — Telephone Encounter (Signed)
Prior auth initiated with [employer]  via CoverMyMeds for oxycodone acetaminophen 10-325 #120.

## 2021-12-14 NOTE — Progress Notes (Signed)
Subjective:    Patient ID: George Villa, male    DOB: 1965-12-01, 56 y.o.   MRN: [id number]  HPI: Ezekiel Menzer is a 56 y.o. male who returns for follow up appointment for chronic pain and medication refill. He states his pain is located in his lower back. He rates his pain 8. His current exercise regime is walking and riding his stationary bicycle for 30 minutes 5 days a week.   Mr. Gloor Morphine equivalent is 60.00 MME.   Last UDS was Performed on 11/13/2021,it was consistent.   Pain Inventory Average Pain 8 Pain Right Now 8 My pain is intermittent and dull  In the last 24 hours, has pain interfered with the following? General activity 7 Relation with others 7 Enjoyment of life 7 What TIME of day is your pain at its worst? night Sleep (in general) Fair  Pain is worse with: bending Pain improves with: medication Relief from Meds: 9  Family History  Problem Relation Age of Onset   Heart Problems Father    Hypertension Mother    Stroke Other    Social History   Socioeconomic History   Marital status: Single    Spouse name: Not on file   Number of children: Not on file   Years of education: Not on file   Highest education level: Not on file  Occupational History   Occupation: umemployed     Comment: Works in [employer]   Tobacco Use   Smoking status: Never   Smokeless tobacco: Never  Vaping Use   Vaping Use: Never used  Substance and Sexual Activity   Alcohol use: Yes    Alcohol/week: 6.0 standard drinks of alcohol    Types: 6 Cans of beer per week    Comment: OCCASSIONALLY    Drug use: No   Sexual activity: Yes  Other Topics Concern   Not on file  Social History Narrative   7 cups of caffeine a week    Social Determinants of Health   Financial Resource Strain: Not on file  Food Insecurity: Not on file  Transportation Needs: Not on file  Physical Activity: Not on file  Stress: Not on file  Social Connections: Not on file   Past Surgical  History:  Procedure Laterality Date   BACK SURGERY     BRAIN HEMATOMA EVACUATION  2006   LEFT HEART CATHETERIZATION WITH CORONARY ANGIOGRAM N/A 02/17/2014   Procedure: LEFT HEART CATHETERIZATION WITH CORONARY ANGIOGRAM;  Surgeon: [name], MD;  Location: [facility] CATH LAB;  Service: Cardiovascular;  Laterality: N/A;   LUMBAR DISC SURGERY  2013   PERCUTANEOUS CORONARY STENT INTERVENTION (PCI-S)  02/17/2014   Procedure: PERCUTANEOUS CORONARY STENT INTERVENTION (PCI-S);  Surgeon: [name], MD;  Location: [facility] CATH LAB;  Service: Cardiovascular;;   TRACHEOSTOMY  05/2004   VENTRICULOSTOMY  2006   Past Surgical History:  Procedure Laterality Date   BACK SURGERY     BRAIN HEMATOMA EVACUATION  2006   LEFT HEART CATHETERIZATION WITH CORONARY ANGIOGRAM N/A 02/17/2014   Procedure: LEFT HEART CATHETERIZATION WITH CORONARY ANGIOGRAM;  Surgeon: [name], MD;  Location: [facility] CATH LAB;  Service: Cardiovascular;  Laterality: N/A;   LUMBAR DISC SURGERY  2013   PERCUTANEOUS CORONARY STENT INTERVENTION (PCI-S)  02/17/2014   Procedure: PERCUTANEOUS CORONARY STENT INTERVENTION (PCI-S);  Surgeon: [name], MD;  Location: [facility] CATH LAB;  Service: Cardiovascular;;   TRACHEOSTOMY  05/2004   VENTRICULOSTOMY  2006  Past Medical History:  Diagnosis Date   Acid reflux    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  Opioid Risk Score:   Fall Risk Score:  `1  Depression screen [facility] 2/9     11/13/2021    9:57 AM 10/18/2021    9:25 AM 09/05/2021   10:05 AM 07/25/2021   10:23 AM 06/23/2021   11:19 AM 05/26/2021   11:15 AM 04/28/2021   11:09 AM  Depression screen PHQ 2/9  Decreased Interest 0  0 0 0 0 0 0  Down, Depressed, Hopeless 0 0 0 0 0 0 0  PHQ - 2 Score 0 0 0 0 0 0 0    Review of Systems  Musculoskeletal:  Positive for back pain and gait problem.  All other systems reviewed and are negative.      Objective:   Physical Exam Vitals and nursing note reviewed.  Constitutional:      Appearance: Normal appearance.  Cardiovascular:     Rate and Rhythm: Normal rate and regular rhythm.     Pulses: Normal pulses.     Heart sounds: Normal heart sounds.  Pulmonary:     Effort: Pulmonary effort is normal.     Breath sounds: Normal breath sounds.  Musculoskeletal:     Cervical back: Normal range of motion and neck supple.     Comments: Normal Muscle Bulk and Muscle Testing Reveals:  Upper Extremities: Full ROM and Muscle Strength 5/5 Lumbar Paraspinal Tenderness: L-4-L-5 Lower Extremities: Full ROM and Muscle Strength 5/5 Arises from Table Slowly using cane for support Antalgic  Gait     Skin:    General: Skin is warm and dry.  Neurological:     Mental Status: He is alert and oriented to person, place, and time.  Psychiatric:        Mood and Affect: Mood normal.        Behavior: Behavior normal.         Assessment & Plan:  1. History of multiple strokes with gait disorder, ongoing ataxia and spastic left hemiparesis: Continue  HEP as Tolerated. 12/14/2021 2. Chronic low back pain with documented facet arthropathy and history of intradural defect/schwanomma requiring resection, per Dr. Riley Kill Note.  Continue current medication regimen. Refilled: Percocet 10/325 one q6 prn #120. 12/14/2021  We will continue the opioid monitoring program, this consists of regular clinic visits, examinations, urine drug screen, pill counts as well as use of [place] Controlled Substance Reporting system. A 12 month History has been reviewed on the [place] Controlled Substance Reporting System on 12/14/2021    F/U in 1 month

## 2022-01-02 DIAGNOSIS — R030 Elevated blood-pressure reading, without diagnosis of hypertension: Secondary | ICD-10-CM | POA: Diagnosis not present

## 2022-01-02 DIAGNOSIS — K219 Gastro-esophageal reflux disease without esophagitis: Secondary | ICD-10-CM | POA: Diagnosis not present

## 2022-01-02 DIAGNOSIS — N401 Enlarged prostate with lower urinary tract symptoms: Secondary | ICD-10-CM | POA: Diagnosis not present

## 2022-01-02 DIAGNOSIS — I10 Essential (primary) hypertension: Secondary | ICD-10-CM | POA: Diagnosis not present

## 2022-01-02 DIAGNOSIS — I639 Cerebral infarction, unspecified: Secondary | ICD-10-CM | POA: Diagnosis not present

## 2022-01-02 DIAGNOSIS — R351 Nocturia: Secondary | ICD-10-CM | POA: Diagnosis not present

## 2022-01-02 DIAGNOSIS — R5383 Other fatigue: Secondary | ICD-10-CM | POA: Diagnosis not present

## 2022-01-02 DIAGNOSIS — R7303 Prediabetes: Secondary | ICD-10-CM | POA: Diagnosis not present

## 2022-01-02 DIAGNOSIS — Z125 Encounter for screening for malignant neoplasm of prostate: Secondary | ICD-10-CM | POA: Diagnosis not present

## 2022-01-02 DIAGNOSIS — N183 Chronic kidney disease, stage 3 unspecified: Secondary | ICD-10-CM | POA: Diagnosis not present

## 2022-01-02 DIAGNOSIS — Z6831 Body mass index (BMI) 31.0-31.9, adult: Secondary | ICD-10-CM | POA: Diagnosis not present

## 2022-01-02 DIAGNOSIS — R0602 Shortness of breath: Secondary | ICD-10-CM | POA: Diagnosis not present

## 2022-01-02 DIAGNOSIS — E789 Disorder of lipoprotein metabolism, unspecified: Secondary | ICD-10-CM | POA: Diagnosis not present

## 2022-01-02 DIAGNOSIS — Z0000 Encounter for general adult medical examination without abnormal findings: Secondary | ICD-10-CM | POA: Diagnosis not present

## 2022-01-11 ENCOUNTER — Encounter: Payer: Medicare Other | Attending: Physical Medicine & Rehabilitation | Admitting: Registered Nurse

## 2022-01-11 ENCOUNTER — Encounter: Payer: Self-pay | Admitting: Registered Nurse

## 2022-01-11 VITALS — BP 138/91 | HR 90 | Ht 67.0 in | Wt 199.6 lb

## 2022-01-11 DIAGNOSIS — W19XXXD Unspecified fall, subsequent encounter: Secondary | ICD-10-CM | POA: Diagnosis not present

## 2022-01-11 DIAGNOSIS — Z5181 Encounter for therapeutic drug level monitoring: Secondary | ICD-10-CM | POA: Diagnosis not present

## 2022-01-11 DIAGNOSIS — M545 Low back pain, unspecified: Secondary | ICD-10-CM | POA: Diagnosis not present

## 2022-01-11 DIAGNOSIS — Z79891 Long term (current) use of opiate analgesic: Secondary | ICD-10-CM | POA: Insufficient documentation

## 2022-01-11 DIAGNOSIS — Y92009 Unspecified place in unspecified non-institutional (private) residence as the place of occurrence of the external cause: Secondary | ICD-10-CM | POA: Insufficient documentation

## 2022-01-11 DIAGNOSIS — R269 Unspecified abnormalities of gait and mobility: Secondary | ICD-10-CM | POA: Diagnosis not present

## 2022-01-11 DIAGNOSIS — G894 Chronic pain syndrome: Secondary | ICD-10-CM | POA: Diagnosis not present

## 2022-01-11 DIAGNOSIS — M47819 Spondylosis without myelopathy or radiculopathy, site unspecified: Secondary | ICD-10-CM | POA: Insufficient documentation

## 2022-01-11 DIAGNOSIS — M47816 Spondylosis without myelopathy or radiculopathy, lumbar region: Secondary | ICD-10-CM | POA: Diagnosis not present

## 2022-01-11 DIAGNOSIS — Z8673 Personal history of transient ischemic attack (TIA), and cerebral infarction without residual deficits: Secondary | ICD-10-CM | POA: Diagnosis not present

## 2022-01-11 DIAGNOSIS — G8114 Spastic hemiplegia affecting left nondominant side: Secondary | ICD-10-CM | POA: Insufficient documentation

## 2022-01-11 MED ORDER — OXYCODONE-ACETAMINOPHEN 10-325 MG PO TABS: 1.0000 | ORAL_TABLET | Freq: Four times a day (QID) | ORAL | 0 refills | Status: DC | PRN

## 2022-01-11 NOTE — Progress Notes (Signed)
Subjective:    Patient ID: George Villa, male    DOB: 24-Apr-1965, 56 y.o.   MRN: [id number]  HPI: George Villa is a 56 y.o. male who returns for follow up appointment for chronic pain and medication refill. He states his  pain is located in his lower back. He rates his pain 8. His current exercise regime is walking and performing stretching exercises.  George Villa states he had a fall three weeks ago, he was walking in his home and tripped over the throw rug, he was able to pick himself up. He didn't seek medical attention. . Educated on falls prevention and he has picked up the throw rug. He also reports he was having increase intensity of pain post fall, he admits to taking an extra oxycodone for a few days. He was educated on the narcotic polcy, self medicating and he I aware if this occurs again, can lead to being discharged from our office he verbalizes understanding.   George Villa Morphine equivalent is 60.00 MME.   Last UDS was Performed on 11/13/2021, it was consistent.     Pain Inventory Average Pain 8 Pain Right Now 8 My pain is dull  In the last 24 hours, has pain interfered with the following? General activity 7 Relation with others 7 Enjoyment of life 7 What TIME of day is your pain at its worst? evening Sleep (in general) Fair  Pain is worse with: walking and bending Pain improves with: medication Relief from Meds: 9  Family History  Problem Relation Age of Onset   Heart Problems Father    Hypertension Mother    Stroke Other    Social History   Socioeconomic History   Marital status: Single    Spouse name: Not on file   Number of children: Not on file   Years of education: Not on file   Highest education level: Not on file  Occupational History   Occupation: umemployed     Comment: Works in [place] Use   Smoking status: Never   Smokeless tobacco: Never  Vaping Use   Vaping Use: Never used  Substance and Sexual Activity   Alcohol  use: Yes    Alcohol/week: 6.0 standard drinks of alcohol    Types: 6 Cans of beer per week    Comment: OCCASSIONALLY    Drug use: No   Sexual activity: Yes  Other Topics Concern   Not on file  Social History Narrative   7 cups of caffeine a week    Social Determinants of Health   Financial Resource Strain: Not on file  Food Insecurity: Not on file  Transportation Needs: Not on file  Physical Activity: Not on file  Stress: Not on file  Social Connections: Not on file   Past Surgical History:  Procedure Laterality Date   [place]  2006   [place] N/A 02/17/2014   Procedure: [place];  Surgeon: Sinclair Grooms, MD;  Location: [facility] CATH LAB;  Service: Cardiovascular;  Laterality: N/A;   LUMBAR DISC SURGERY  2013   PERCUTANEOUS CORONARY STENT INTERVENTION (PCI-S)  02/17/2014   Procedure: PERCUTANEOUS CORONARY STENT INTERVENTION (PCI-S);  Surgeon: Sinclair Grooms, MD;  Location: [facility] CATH LAB;  Service: Cardiovascular;;   TRACHEOSTOMY  05/2004   VENTRICULOSTOMY  2006   Past Surgical History:  Procedure Laterality Date   BACK SURGERY  BRAIN HEMATOMA EVACUATION  2006   LEFT HEART CATHETERIZATION WITH CORONARY ANGIOGRAM N/A 02/17/2014   Procedure: LEFT HEART CATHETERIZATION WITH CORONARY ANGIOGRAM;  Surgeon: Sinclair Grooms, MD;  Location: [facility] CATH LAB;  Service: Cardiovascular;  Laterality: N/A;   LUMBAR DISC SURGERY  2013   PERCUTANEOUS CORONARY STENT INTERVENTION (PCI-S)  02/17/2014   Procedure: PERCUTANEOUS CORONARY STENT INTERVENTION (PCI-S);  Surgeon: Sinclair Grooms, MD;  Location: [facility] CATH LAB;  Service: Cardiovascular;;   TRACHEOSTOMY  05/2004   VENTRICULOSTOMY  2006   Past Medical History:  Diagnosis Date   Acid reflux    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   Archie Endo 02/17/2014   Stroke ([place]) 2006   "left leg weak since" (02/18/2014)   BP (!) 138/91   Pulse 90   Ht 5\' 7"  (1.702 m)   Wt 199 lb 9.6 oz (90.5 kg)   SpO2 96%   BMI 31.26 kg/m   Opioid Risk Score:   Fall Risk Score:  `1  Depression screen [facility] 2/9     12/14/2021    9:41 AM 11/13/2021    9:57 AM 10/18/2021    9:25 AM 09/05/2021   10:05 AM 07/25/2021   10:23 AM 06/23/2021   11:19 AM 05/26/2021   11:15 AM  Depression screen PHQ 2/9  Decreased Interest 0 0 0 0 0 0 0  Down, Depressed, Hopeless 0 0 0 0 0 0 0  PHQ - 2 Score 0 0 0 0 0 0 0     Review of Systems  Musculoskeletal:  Positive for back pain.  All other systems reviewed and are negative.     Objective:   Physical Exam Vitals and nursing note reviewed.  Constitutional:      Appearance: Normal appearance.  Cardiovascular:     Rate and Rhythm: Normal rate and regular rhythm.     Pulses: Normal pulses.     Heart sounds: Normal heart sounds.  Pulmonary:     Effort: Pulmonary effort is normal.     Breath sounds: Normal breath sounds.  Musculoskeletal:     Cervical back: Normal range of motion and neck supple.     Comments: Normal Muscle Bulk and Muscle Testing Reveals:  Upper Extremities: Right: Full ROM and Muscle Strength 5/5 Left Upper Extremity: Decreased ROM and Muscle Strength 5/5  Lumbar Paraspinal Tenderness: L-3-L-5 Lower Extremities: Full ROM and Muscle Strength 5/5 Arises from Chair slowly using cane for support Antalgic  Gait     Skin:    General: Skin is warm and dry.  Neurological:     Mental Status: He is alert and oriented to person, place, and time.  Psychiatric:        Mood and Affect: Mood normal.        Behavior: Behavior normal.         Assessment & Plan:  1. History of multiple strokes with gait disorder, ongoing ataxia and spastic left hemiparesis: Continue  HEP as Tolerated.  01/11/2022 2. Chronic low back pain with documented facet arthropathy and history of intradural defect/schwanomma requiring resection, per Dr. Naaman Plummer Note.  Continue current medication regimen. Refilled: Percocet 10/325 one q6 prn #120. 01/11/2022  We will continue the opioid monitoring program, this consists of regular clinic visits, examinations, urine drug screen,  pill counts as well as use of [place] Controlled Substance Reporting system. A 12 month History has been reviewed on the [place] Controlled Substance Reporting System on 01/11/2022  3. Fall at Home: Educated on Fall Prevention. He verbalizes understanding.    F/U in 1 month

## 2022-01-31 DIAGNOSIS — I5042 Chronic combined systolic (congestive) and diastolic (congestive) heart failure: Secondary | ICD-10-CM | POA: Diagnosis not present

## 2022-01-31 DIAGNOSIS — I517 Cardiomegaly: Secondary | ICD-10-CM | POA: Diagnosis not present

## 2022-01-31 DIAGNOSIS — R0602 Shortness of breath: Secondary | ICD-10-CM | POA: Diagnosis not present

## 2022-01-31 DIAGNOSIS — N1832 Chronic kidney disease, stage 3b: Secondary | ICD-10-CM | POA: Diagnosis not present

## 2022-01-31 DIAGNOSIS — I420 Dilated cardiomyopathy: Secondary | ICD-10-CM | POA: Diagnosis not present

## 2022-01-31 DIAGNOSIS — I5032 Chronic diastolic (congestive) heart failure: Secondary | ICD-10-CM | POA: Diagnosis not present

## 2022-01-31 DIAGNOSIS — I10 Essential (primary) hypertension: Secondary | ICD-10-CM | POA: Diagnosis not present

## 2022-01-31 DIAGNOSIS — R0609 Other forms of dyspnea: Secondary | ICD-10-CM | POA: Diagnosis not present

## 2022-01-31 DIAGNOSIS — G4730 Sleep apnea, unspecified: Secondary | ICD-10-CM | POA: Diagnosis not present

## 2022-01-31 DIAGNOSIS — Z79899 Other long term (current) drug therapy: Secondary | ICD-10-CM | POA: Diagnosis not present

## 2022-02-09 ENCOUNTER — Encounter: Payer: Medicare Other | Attending: Physical Medicine & Rehabilitation | Admitting: Registered Nurse

## 2022-02-09 ENCOUNTER — Other Ambulatory Visit: Payer: Self-pay

## 2022-02-09 ENCOUNTER — Encounter: Payer: Self-pay | Admitting: Registered Nurse

## 2022-02-09 VITALS — BP 138/93 | HR 91 | Ht 67.0 in | Wt 202.6 lb

## 2022-02-09 DIAGNOSIS — M47816 Spondylosis without myelopathy or radiculopathy, lumbar region: Secondary | ICD-10-CM

## 2022-02-09 DIAGNOSIS — Z5181 Encounter for therapeutic drug level monitoring: Secondary | ICD-10-CM | POA: Insufficient documentation

## 2022-02-09 DIAGNOSIS — R269 Unspecified abnormalities of gait and mobility: Secondary | ICD-10-CM | POA: Diagnosis not present

## 2022-02-09 DIAGNOSIS — I420 Dilated cardiomyopathy: Secondary | ICD-10-CM | POA: Diagnosis not present

## 2022-02-09 DIAGNOSIS — Z79891 Long term (current) use of opiate analgesic: Secondary | ICD-10-CM | POA: Insufficient documentation

## 2022-02-09 DIAGNOSIS — G894 Chronic pain syndrome: Secondary | ICD-10-CM | POA: Insufficient documentation

## 2022-02-09 DIAGNOSIS — G8114 Spastic hemiplegia affecting left nondominant side: Secondary | ICD-10-CM | POA: Insufficient documentation

## 2022-02-09 MED ORDER — OXYCODONE-ACETAMINOPHEN 10-325 MG PO TABS: 1.0000 | ORAL_TABLET | Freq: Four times a day (QID) | ORAL | 0 refills | Status: DC | PRN

## 2022-02-09 NOTE — Addendum Note (Signed)
Addended by: Bayard Hugger on: 02/09/2022 03:56 PM   Modules accepted: Orders

## 2022-02-09 NOTE — Progress Notes (Signed)
Subjective:    Patient ID: George Villa, male    DOB: 17-Dec-1965, 56 y.o.   MRN: [id number]  HPI: George Villa is a 56 y.o. male who returns for follow up appointment for chronic pain and medication refill.He  states his pain is located in his lower back. He rates his pain 8. His current exercise regime is walking and riding his stationary bicycle 5 days a week.  George Villa Morphine equivalent is 60.00 MME.   Last UDS was Performed on 08/087/2023, it was consistent   Pain Inventory Average Pain 8 Pain Right Now 8 My pain is dull  In the last 24 hours, has pain interfered with the following? General activity 8 Relation with others 8 Enjoyment of life 8 What TIME of day is your pain at its worst? night Sleep (in general) Fair  Pain is worse with: bending and standing Pain improves with: medication Relief from Meds: 8  Family History  Problem Relation Age of Onset   Heart Problems Father    Hypertension Mother    Stroke Other    Social History   Socioeconomic History   Marital status: Single    Spouse name: Not on file   Number of children: Not on file   Years of education: Not on file   Highest education level: Not on file  Occupational History   Occupation: umemployed     Comment: Works in [place] Use   Smoking status: Never   Smokeless tobacco: Never  Vaping Use   Vaping Use: Never used  Substance and Sexual Activity   Alcohol use: Yes    Alcohol/week: 6.0 standard drinks of alcohol    Types: 6 Cans of beer per week    Comment: OCCASSIONALLY    Drug use: No   Sexual activity: Yes  Other Topics Concern   Not on file  Social History Narrative   7 cups of caffeine a week    Social Determinants of Health   Financial Resource Strain: Not on file  Food Insecurity: Not on file  Transportation Needs: Not on file  Physical Activity: Not on file  Stress: Not on file  Social Connections: Not on file   Past Surgical History:  Procedure  Laterality Date   [place]  2006   [place] N/A 02/17/2014   Procedure: [place];  Surgeon: Sinclair Grooms, MD;  Location: [facility] CATH LAB;  Service: Cardiovascular;  Laterality: N/A;   LUMBAR DISC SURGERY  2013   PERCUTANEOUS CORONARY STENT INTERVENTION (PCI-S)  02/17/2014   Procedure: PERCUTANEOUS CORONARY STENT INTERVENTION (PCI-S);  Surgeon: Sinclair Grooms, MD;  Location: [facility] CATH LAB;  Service: Cardiovascular;;   TRACHEOSTOMY  05/2004   VENTRICULOSTOMY  2006   Past Surgical History:  Procedure Laterality Date   BACK SURGERY     BRAIN HEMATOMA EVACUATION  2006   LEFT HEART CATHETERIZATION WITH CORONARY ANGIOGRAM N/A 02/17/2014   Procedure: LEFT HEART CATHETERIZATION WITH CORONARY ANGIOGRAM;  Surgeon: Sinclair Grooms, MD;  Location: [facility] CATH LAB;  Service: Cardiovascular;  Laterality: N/A;   LUMBAR DISC SURGERY  2013   PERCUTANEOUS CORONARY STENT INTERVENTION (PCI-S)  02/17/2014   Procedure: PERCUTANEOUS CORONARY STENT INTERVENTION (PCI-S);  Surgeon: Sinclair Grooms, MD;  Location: [facility] CATH LAB;  Service: Cardiovascular;;   TRACHEOSTOMY  05/2004   VENTRICULOSTOMY  2006   Past  Medical History:  Diagnosis Date   Acid reflux    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  Opioid Risk Score:   Fall Risk Score:  `1  Depression screen [facility] 2/9     12/14/2021    9:41 AM 11/13/2021    9:57 AM 10/18/2021    9:25 AM 09/05/2021   10:05 AM 07/25/2021   10:23 AM 06/23/2021   11:19 AM 05/26/2021   11:15 AM  Depression screen PHQ 2/9  Decreased Interest 0 0 0 0 0 0 0  Down,  Depressed, Hopeless 0 0 0 0 0 0 0  PHQ - 2 Score 0 0 0 0 0 0 0     Review of Systems  Constitutional: Negative.   HENT: Negative.    Eyes: Negative.   Respiratory: Negative.    Endocrine: Negative.   Genitourinary: Negative.   Musculoskeletal:  Positive for back pain and gait problem.  Skin: Negative.   Allergic/Immunologic: Negative.   Hematological: Negative.   Psychiatric/Behavioral: Negative.        Objective:   Physical Exam Vitals and nursing note reviewed.  Constitutional:      Appearance: Normal appearance.  Cardiovascular:     Rate and Rhythm: Normal rate and regular rhythm.     Pulses: Normal pulses.     Heart sounds: Normal heart sounds.  Musculoskeletal:     Cervical back: Normal range of motion and neck supple.     Comments: Normal Muscle Bulk and Muscle Testing Reveals: Upper Extremities: Full ROM and Muscle Strength 5/5 Lumbar Paraspinal Tenderness: L-4-L-5 Lower Extremities: Right: Decreased ROM and Muscle Strength 5/5 Left Lower Extremity: Full ROM and Muscle Strength 5/5  Arises from chair slowly using cane for support Antalgic Gait    Skin:    General: Skin is warm and dry.  Neurological:     Mental Status: He is alert and oriented to person, place, and time.  Psychiatric:        Mood and Affect: Mood normal.        Behavior: Behavior normal.         Assessment & Plan:  1. History of multiple strokes with gait disorder, ongoing ataxia and spastic left hemiparesis: Continue  HEP as Tolerated. 02/09/2022 2. Chronic low back pain with documented facet arthropathy and history of intradural defect/schwanomma requiring resection, per Dr. Naaman Plummer Note.  Continue current medication regimen. Refilled: Percocet 10/325 one q6 prn #120. 02/09/2022  We will continue the opioid monitoring program, this consists of regular clinic visits, examinations, urine drug screen, pill counts as well as use of [place] Controlled Substance Reporting system. A 12  month History has been reviewed on the [place] on 02/09/2022     F/U in 1 month

## 2022-03-09 ENCOUNTER — Telehealth: Payer: Self-pay | Admitting: Registered Nurse

## 2022-03-09 ENCOUNTER — Encounter: Payer: Medicare Other | Admitting: Registered Nurse

## 2022-03-09 DIAGNOSIS — M47816 Spondylosis without myelopathy or radiculopathy, lumbar region: Secondary | ICD-10-CM

## 2022-03-09 MED ORDER — OXYCODONE-ACETAMINOPHEN 10-325 MG PO TABS: 1.0000 | ORAL_TABLET | Freq: Four times a day (QID) | ORAL | 0 refills | Status: DC | PRN

## 2022-03-09 NOTE — Telephone Encounter (Signed)
PMP was Reviewed.  Oxycodone e-scribed to pharmacy. [name] is aware of the above and verbalizes understanding.

## 2022-03-22 DIAGNOSIS — G4730 Sleep apnea, unspecified: Secondary | ICD-10-CM | POA: Diagnosis not present

## 2022-03-22 DIAGNOSIS — I5042 Chronic combined systolic (congestive) and diastolic (congestive) heart failure: Secondary | ICD-10-CM | POA: Diagnosis not present

## 2022-03-22 DIAGNOSIS — I10 Essential (primary) hypertension: Secondary | ICD-10-CM | POA: Diagnosis not present

## 2022-03-22 DIAGNOSIS — N1832 Chronic kidney disease, stage 3b: Secondary | ICD-10-CM | POA: Diagnosis not present

## 2022-04-03 ENCOUNTER — Encounter: Payer: Medicare Other | Attending: Physical Medicine & Rehabilitation | Admitting: Registered Nurse

## 2022-04-03 ENCOUNTER — Encounter: Payer: Self-pay | Admitting: Registered Nurse

## 2022-04-03 VITALS — BP 127/87 | HR 98 | Ht 67.0 in | Wt 197.0 lb

## 2022-04-03 DIAGNOSIS — G894 Chronic pain syndrome: Secondary | ICD-10-CM | POA: Insufficient documentation

## 2022-04-03 DIAGNOSIS — M47816 Spondylosis without myelopathy or radiculopathy, lumbar region: Secondary | ICD-10-CM | POA: Diagnosis not present

## 2022-04-03 DIAGNOSIS — R269 Unspecified abnormalities of gait and mobility: Secondary | ICD-10-CM | POA: Diagnosis not present

## 2022-04-03 DIAGNOSIS — Z79891 Long term (current) use of opiate analgesic: Secondary | ICD-10-CM | POA: Diagnosis not present

## 2022-04-03 DIAGNOSIS — Z5181 Encounter for therapeutic drug level monitoring: Secondary | ICD-10-CM | POA: Diagnosis not present

## 2022-04-03 DIAGNOSIS — G8114 Spastic hemiplegia affecting left nondominant side: Secondary | ICD-10-CM | POA: Diagnosis not present

## 2022-04-03 MED ORDER — OXYCODONE-ACETAMINOPHEN 10-325 MG PO TABS: 1.0000 | ORAL_TABLET | Freq: Four times a day (QID) | ORAL | 0 refills | Status: DC | PRN

## 2022-04-03 NOTE — Progress Notes (Signed)
Subjective:    Patient ID: George Villa, male    DOB: [birth year], 56 y.o.   MRN: [id number]  HPI: George Villa is a 56 y.o. male who returns for follow up appointment for chronic pain and medication refill. He states his pain is located in his lower back pain. She rates her pain 7. Her current exercise regime is walking and performing stretching exercises.  Mr. Trageser Morphine equivalent is 60.00 MME.   Last UDS was Performed on 11/13/2021, it was consistent.    Pain Inventory Average Pain 7 Pain Right Now 7 My pain is dull  In the last 24 hours, has pain interfered with the following? General activity 7 Relation with others 7 Enjoyment of life 7 What TIME of day is your pain at its worst? evening Sleep (in general) Fair  Pain is worse with: walking and bending Pain improves with: medication Relief from Meds: 9  Family History  Problem Relation Age of Onset   Heart Problems Father    Hypertension Mother    Stroke Other    Social History   Socioeconomic History   Marital status: Single    Spouse name: Not on file   Number of children: Not on file   Years of education: Not on file   Highest education level: Not on file  Occupational History   Occupation: umemployed     Comment: Works in [employer]   Tobacco Use   Smoking status: Never   Smokeless tobacco: Never  Vaping Use   Vaping Use: Never used  Substance and Sexual Activity   Alcohol use: Yes    Alcohol/week: 6.0 standard drinks of alcohol    Types: 6 Cans of beer per week    Comment: OCCASSIONALLY    Drug use: No   Sexual activity: Yes  Other Topics Concern   Not on file  Social History Narrative   7 cups of caffeine a week    Social Determinants of Health   Financial Resource Strain: Not on file  Food Insecurity: Not on file  Transportation Needs: Not on file  Physical Activity: Not on file  Stress: Not on file  Social Connections: Not on file   Past Surgical History:  Procedure  Laterality Date   BACK SURGERY     BRAIN HEMATOMA EVACUATION  2006   LEFT HEART CATHETERIZATION WITH CORONARY ANGIOGRAM N/A 02/17/2014   Procedure: LEFT HEART CATHETERIZATION WITH CORONARY ANGIOGRAM;  Surgeon: [name], MD;  Location: [facility] CATH LAB;  Service: Cardiovascular;  Laterality: N/A;   LUMBAR DISC SURGERY  2013   PERCUTANEOUS CORONARY STENT INTERVENTION (PCI-S)  02/17/2014   Procedure: PERCUTANEOUS CORONARY STENT INTERVENTION (PCI-S);  Surgeon: [name], MD;  Location: [facility] CATH LAB;  Service: Cardiovascular;;   TRACHEOSTOMY  05/2004   VENTRICULOSTOMY  2006   Past Surgical History:  Procedure Laterality Date   BACK SURGERY     BRAIN HEMATOMA EVACUATION  2006   LEFT HEART CATHETERIZATION WITH CORONARY ANGIOGRAM N/A 02/17/2014   Procedure: LEFT HEART CATHETERIZATION WITH CORONARY ANGIOGRAM;  Surgeon: [name], MD;  Location: [facility] CATH LAB;  Service: Cardiovascular;  Laterality: N/A;   LUMBAR DISC SURGERY  2013   PERCUTANEOUS CORONARY STENT INTERVENTION (PCI-S)  02/17/2014   Procedure: PERCUTANEOUS CORONARY STENT INTERVENTION (PCI-S);  Surgeon: [name], MD;  Location: [facility] CATH LAB;  Service: Cardiovascular;;   TRACHEOSTOMY  05/2004   VENTRICULOSTOMY  2006   Past Medical History:  Diagnosis Date   Acid reflux    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   [name] 02/17/2014   Stroke (HCC) 2006   "left leg weak since" (02/18/2014)   BP 127/87   Pulse 98   Ht 5\' 7"  (1.702 m)   Wt 197 lb (89.4 kg)   SpO2 96%   BMI 30.85 kg/m   Opioid Risk Score:   Fall Risk Score:  `1  Depression screen PHQ 2/9     04/03/2022    1:01 PM 12/14/2021    9:41 AM 11/13/2021    9:57 AM 10/18/2021    9:25 AM 09/05/2021   10:05 AM 07/25/2021   10:23 AM 06/23/2021   11:19 AM  Depression  screen PHQ 2/9  Decreased Interest 0 0 0 0 0 0 0  Down, Depressed, Hopeless 0 0 0 0 0 0 0  PHQ - 2 Score 0 0 0 0 0 0 0      Review of Systems  Musculoskeletal:  Positive for back pain and gait problem.  All other systems reviewed and are negative.      Objective:   Physical Exam Vitals and nursing note reviewed.  Constitutional:      Appearance: Normal appearance.  Cardiovascular:     Rate and Rhythm: Normal rate and regular rhythm.     Pulses: Normal pulses.     Heart sounds: Normal heart sounds.  Pulmonary:     Effort: Pulmonary effort is normal.     Breath sounds: Normal breath sounds.  Musculoskeletal:     Cervical back: Normal range of motion and neck supple.     Comments: Normal Muscle Bulk and Muscle Testing Reveals:  Upper Extremities: Full ROM and Muscle Strength 5/5  Lumbar: Paraspinal Tenderness: L-4-L-5 Lower Extremities: Full ROM and Muscle Strength 5/5 Arises from Chair slowly using cane for support Antalgic  Gait     Skin:    General: Skin is warm and dry.  Neurological:     Mental Status: He is alert and oriented to person, place, and time.  Psychiatric:        Mood and Affect: Mood normal.        Behavior: Behavior normal.         Assessment & Plan:  1. History of multiple strokes with gait disorder, ongoing ataxia and spastic left hemiparesis: Continue  HEP as Tolerated. 04/03/2022 2. Chronic low back pain with documented facet arthropathy and history of intradural defect/schwanomma requiring resection, per Dr. 04/05/2022 Note.  Continue current medication regimen. Refilled: Percocet 10/325 one q6 prn #120. 04/03/2022  We will continue the opioid monitoring program, this consists of regular clinic visits, examinations, urine drug screen, pill counts as well as use of 04/05/2022 Controlled Substance Reporting system. A 12 month History has been reviewed on the [place] Controlled Substance Reporting System on 04/03/2022      F/U in 1  month

## 2022-05-02 DIAGNOSIS — Z01810 Encounter for preprocedural cardiovascular examination: Secondary | ICD-10-CM | POA: Diagnosis not present

## 2022-05-03 NOTE — Progress Notes (Signed)
Subjective:    Patient ID: George Villa, male    DOB: Sep 05, 1965, 57 y.o.   MRN: [id number]  HPI: George Villa is a 57 y.o. male who returns for follow up appointment for chronic pain and medication refill. He states his pain is located in his lower back. He rates his pain 8. His current exercise regime is walking and performing stretching exercises.  Mr. Chaves Morphine equivalent is 60.00 MME.   UDS ordered today.    Pain Inventory Average Pain 8 Pain Right Now 8 My pain is dull  In the last 24 hours, has pain interfered with the following? General activity 8 Relation with others 8 Enjoyment of life 8 What TIME of day is your pain at its worst? night Sleep (in general) Fair  Pain is worse with: walking and bending Pain improves with: medication Relief from Meds: 9  Family History  Problem Relation Age of Onset   Heart Problems Father    Hypertension Mother    Stroke Other    Social History   Socioeconomic History   Marital status: Single    Spouse name: Not on file   Number of children: Not on file   Years of education: Not on file   Highest education level: Not on file  Occupational History   Occupation: umemployed     Comment: Works in [place] Use   Smoking status: Never   Smokeless tobacco: Never  Vaping Use   Vaping Use: Never used  Substance and Sexual Activity   Alcohol use: Yes    Alcohol/week: 6.0 standard drinks of alcohol    Types: 6 Cans of beer per week    Comment: OCCASSIONALLY    Drug use: No   Sexual activity: Yes  Other Topics Concern   Not on file  Social History Narrative   7 cups of caffeine a week    Social Determinants of Health   Financial Resource Strain: Not on file  Food Insecurity: Not on file  Transportation Needs: Not on file  Physical Activity: Not on file  Stress: Not on file  Social Connections: Not on file   Past Surgical History:  Procedure Laterality Date   [place]  2006   [place] N/A 02/17/2014   Procedure: [place];  Surgeon: Sinclair Grooms, MD;  Location: [facility] CATH LAB;  Service: Cardiovascular;  Laterality: N/A;   LUMBAR DISC SURGERY  2013   PERCUTANEOUS CORONARY STENT INTERVENTION (PCI-S)  02/17/2014   Procedure: PERCUTANEOUS CORONARY STENT INTERVENTION (PCI-S);  Surgeon: Sinclair Grooms, MD;  Location: [facility] CATH LAB;  Service: Cardiovascular;;   TRACHEOSTOMY  05/2004   VENTRICULOSTOMY  2006   Past Surgical History:  Procedure Laterality Date   BACK SURGERY     BRAIN HEMATOMA EVACUATION  2006   LEFT HEART CATHETERIZATION WITH CORONARY ANGIOGRAM N/A 02/17/2014   Procedure: LEFT HEART CATHETERIZATION WITH CORONARY ANGIOGRAM;  Surgeon: Sinclair Grooms, MD;  Location: [facility] CATH LAB;  Service: Cardiovascular;  Laterality: N/A;   LUMBAR DISC SURGERY  2013   PERCUTANEOUS CORONARY STENT INTERVENTION (PCI-S)  02/17/2014   Procedure: PERCUTANEOUS CORONARY STENT INTERVENTION (PCI-S);  Surgeon: Sinclair Grooms, MD;  Location: [facility] CATH LAB;  Service: Cardiovascular;;   TRACHEOSTOMY  05/2004   VENTRICULOSTOMY  2006   Past Medical History:  Diagnosis Date   Acid reflux  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  Opioid Risk Score:   Fall Risk Score:  `1  Depression screen PHQ 2/9     04/03/2022    1:01 PM 12/14/2021    9:41 AM 11/13/2021    9:57 AM 10/18/2021    9:25 AM 09/05/2021   10:05 AM 07/25/2021   10:23 AM 06/23/2021   11:19 AM  Depression screen PHQ 2/9  Decreased Interest 0 0 0 0 0 0 0  Down, Depressed, Hopeless 0 0 0 0 0 0 0  PHQ - 2  Score 0 0 0 0 0 0 0    Review of Systems  Musculoskeletal:  Positive for back pain and gait problem.  All other systems reviewed and are negative.     Objective:   Physical Exam Vitals and nursing note reviewed.  Constitutional:      Appearance: Normal appearance.  Cardiovascular:     Rate and Rhythm: Normal rate and regular rhythm.     Pulses: Normal pulses.     Heart sounds: Normal heart sounds.  Pulmonary:     Effort: Pulmonary effort is normal.     Breath sounds: Normal breath sounds.  Musculoskeletal:     Cervical back: Normal range of motion and neck supple.     Comments: Normal Muscle Bulk and Muscle Testing Reveals:  Upper Extremities: Full ROM and Muscle Strength 5/5  Lumbar Paraspinal Tenderness: L-3-L-5 Lower Extremities: Full ROM and Muscle Strength 5/5 Arises from Table slowly using cane for support Antalgic  Gait     Skin:    General: Skin is warm and dry.  Neurological:     Mental Status: He is alert and oriented to person, place, and time.  Psychiatric:        Mood and Affect: Mood normal.        Behavior: Behavior normal.         Assessment & Plan:  1. History of multiple strokes with gait disorder, ongoing ataxia and spastic left hemiparesis: Continue  HEP as Tolerated. 05/04/2022 2. Chronic low back pain with documented facet arthropathy and history of intradural defect/schwanomma requiring resection, per Dr. Naaman Plummer Note.  Continue current medication regimen. Refilled: Percocet 10/325 one q6 prn #120. 05/04/2022  We will continue the opioid monitoring program, this consists of regular clinic visits, examinations, urine drug screen, pill counts as well as use of [place] Controlled Substance Reporting system. A 12 month History has been reviewed on the [place] on 05/04/2022      F/U in 1 month

## 2022-05-04 ENCOUNTER — Encounter: Payer: Self-pay | Admitting: Registered Nurse

## 2022-05-04 ENCOUNTER — Encounter: Payer: Medicare Other | Attending: Physical Medicine & Rehabilitation | Admitting: Registered Nurse

## 2022-05-04 VITALS — BP 130/73 | HR 102 | Ht 67.0 in | Wt 190.0 lb

## 2022-05-04 DIAGNOSIS — G894 Chronic pain syndrome: Secondary | ICD-10-CM | POA: Diagnosis not present

## 2022-05-04 DIAGNOSIS — Z5181 Encounter for therapeutic drug level monitoring: Secondary | ICD-10-CM | POA: Diagnosis not present

## 2022-05-04 DIAGNOSIS — R269 Unspecified abnormalities of gait and mobility: Secondary | ICD-10-CM

## 2022-05-04 DIAGNOSIS — M47816 Spondylosis without myelopathy or radiculopathy, lumbar region: Secondary | ICD-10-CM | POA: Diagnosis not present

## 2022-05-04 DIAGNOSIS — Z79891 Long term (current) use of opiate analgesic: Secondary | ICD-10-CM | POA: Diagnosis not present

## 2022-05-04 DIAGNOSIS — G8114 Spastic hemiplegia affecting left nondominant side: Secondary | ICD-10-CM

## 2022-05-04 MED ORDER — OXYCODONE-ACETAMINOPHEN 10-325 MG PO TABS: 1.0000 | ORAL_TABLET | Freq: Four times a day (QID) | ORAL | 0 refills | Status: DC | PRN

## 2022-05-08 DIAGNOSIS — G4733 Obstructive sleep apnea (adult) (pediatric): Secondary | ICD-10-CM | POA: Diagnosis not present

## 2022-05-09 LAB — TOXASSURE SELECT,+ANTIDEPR,UR

## 2022-05-10 DIAGNOSIS — Z6841 Body Mass Index (BMI) 40.0 and over, adult: Secondary | ICD-10-CM | POA: Diagnosis not present

## 2022-05-10 DIAGNOSIS — I5042 Chronic combined systolic (congestive) and diastolic (congestive) heart failure: Secondary | ICD-10-CM | POA: Diagnosis not present

## 2022-05-11 DIAGNOSIS — I420 Dilated cardiomyopathy: Secondary | ICD-10-CM | POA: Diagnosis not present

## 2022-05-29 ENCOUNTER — Telehealth: Payer: Self-pay | Admitting: Registered Nurse

## 2022-05-29 DIAGNOSIS — M47816 Spondylosis without myelopathy or radiculopathy, lumbar region: Secondary | ICD-10-CM

## 2022-05-29 MED ORDER — OXYCODONE-ACETAMINOPHEN 10-325 MG PO TABS: 1.0000 | ORAL_TABLET | Freq: Four times a day (QID) | ORAL | 0 refills | Status: DC | PRN

## 2022-05-29 NOTE — Telephone Encounter (Signed)
Patient notified via v/m.

## 2022-05-29 NOTE — Telephone Encounter (Signed)
Oxycodone rx sent to pharmacy

## 2022-05-29 NOTE — Telephone Encounter (Signed)
When I called him to reschedule his appointment with Zella Ball due to her being out sick, he told me he will run out of his medicine on Friday.

## 2022-05-31 DIAGNOSIS — I5020 Unspecified systolic (congestive) heart failure: Secondary | ICD-10-CM | POA: Diagnosis not present

## 2022-05-31 DIAGNOSIS — Z6841 Body Mass Index (BMI) 40.0 and over, adult: Secondary | ICD-10-CM | POA: Diagnosis not present

## 2022-05-31 DIAGNOSIS — R0602 Shortness of breath: Secondary | ICD-10-CM | POA: Diagnosis not present

## 2022-06-01 ENCOUNTER — Encounter: Payer: Medicare Other | Admitting: Registered Nurse

## 2022-06-01 ENCOUNTER — Telehealth: Payer: Self-pay | Admitting: Physical Medicine & Rehabilitation

## 2022-06-01 NOTE — Telephone Encounter (Signed)
done 

## 2022-06-01 NOTE — Telephone Encounter (Signed)
Patient called about his refill for oxycodone. He said it still hasn't been sent in

## 2022-06-01 NOTE — Telephone Encounter (Signed)
Summary: Take 1 tablet by mouth every 6 (six) hours as needed for pain., Starting Tue 05/29/2022, Normal Dose, Route, Frequency: 1 tablet, Oral, Every 6 hours PRNStart: 02/20/2024Ord/Sold: 05/29/2022 (O)Ordered On: 02/20/2024Pharmacy: [place] [place] - [address]

## 2022-06-04 DIAGNOSIS — R351 Nocturia: Secondary | ICD-10-CM | POA: Diagnosis not present

## 2022-06-04 DIAGNOSIS — N401 Enlarged prostate with lower urinary tract symptoms: Secondary | ICD-10-CM | POA: Diagnosis not present

## 2022-06-04 DIAGNOSIS — K219 Gastro-esophageal reflux disease without esophagitis: Secondary | ICD-10-CM | POA: Diagnosis not present

## 2022-06-04 DIAGNOSIS — E789 Disorder of lipoprotein metabolism, unspecified: Secondary | ICD-10-CM | POA: Diagnosis not present

## 2022-06-04 DIAGNOSIS — E559 Vitamin D deficiency, unspecified: Secondary | ICD-10-CM | POA: Diagnosis not present

## 2022-06-04 DIAGNOSIS — D539 Nutritional anemia, unspecified: Secondary | ICD-10-CM | POA: Diagnosis not present

## 2022-06-04 DIAGNOSIS — I10 Essential (primary) hypertension: Secondary | ICD-10-CM | POA: Diagnosis not present

## 2022-06-04 DIAGNOSIS — I639 Cerebral infarction, unspecified: Secondary | ICD-10-CM | POA: Diagnosis not present

## 2022-06-04 DIAGNOSIS — R7303 Prediabetes: Secondary | ICD-10-CM | POA: Diagnosis not present

## 2022-06-04 DIAGNOSIS — Z6830 Body mass index (BMI) 30.0-30.9, adult: Secondary | ICD-10-CM | POA: Diagnosis not present

## 2022-06-04 DIAGNOSIS — N183 Chronic kidney disease, stage 3 unspecified: Secondary | ICD-10-CM | POA: Diagnosis not present

## 2022-06-04 DIAGNOSIS — N529 Male erectile dysfunction, unspecified: Secondary | ICD-10-CM | POA: Diagnosis not present

## 2022-06-05 DIAGNOSIS — I517 Cardiomegaly: Secondary | ICD-10-CM | POA: Diagnosis not present

## 2022-06-06 ENCOUNTER — Encounter: Payer: Self-pay | Admitting: Registered Nurse

## 2022-06-06 ENCOUNTER — Telehealth: Payer: Self-pay | Admitting: *Deleted

## 2022-06-06 ENCOUNTER — Encounter: Payer: Medicare Other | Attending: Physical Medicine & Rehabilitation | Admitting: Registered Nurse

## 2022-06-06 VITALS — BP 132/91 | HR 74 | Ht 67.0 in | Wt 189.0 lb

## 2022-06-06 DIAGNOSIS — Z5181 Encounter for therapeutic drug level monitoring: Secondary | ICD-10-CM | POA: Diagnosis not present

## 2022-06-06 DIAGNOSIS — I10 Essential (primary) hypertension: Secondary | ICD-10-CM | POA: Insufficient documentation

## 2022-06-06 DIAGNOSIS — G894 Chronic pain syndrome: Secondary | ICD-10-CM | POA: Insufficient documentation

## 2022-06-06 DIAGNOSIS — M47816 Spondylosis without myelopathy or radiculopathy, lumbar region: Secondary | ICD-10-CM | POA: Diagnosis not present

## 2022-06-06 DIAGNOSIS — R269 Unspecified abnormalities of gait and mobility: Secondary | ICD-10-CM | POA: Insufficient documentation

## 2022-06-06 DIAGNOSIS — Z79891 Long term (current) use of opiate analgesic: Secondary | ICD-10-CM | POA: Insufficient documentation

## 2022-06-06 NOTE — Telephone Encounter (Signed)
Prior auth for oxycodone acetaminophen was submitted to [facility] via [employer].

## 2022-06-06 NOTE — Progress Notes (Signed)
Subjective:    Patient ID: George Villa, male    DOB: Mar 01, 1966, 57 y.o.   MRN: [id number]  HPI: George Villa is a 57 y.o. male who returns for follow up appointment for chronic pain and medication refill. He states his pain is located in his .lower back. He  rated his pain on his health history  0. His current exercise regime is walking and riding his stationary bicycle 5 days a week.  George Villa has hypertension, he states this morning he forgot to take his medication. Educated on medication compliance and to keep blood pressure flow sheew and F/U with his PCP he verbalizes understanding. He refuses Urgent Care evaluation, he states he will take his medication and F/U with his PCP.    Mr. Learn Morphine equivalent is 60.00 MME.   Last UDS was Performed on 05/04/2022, it was consistent.      Pain Inventory Average Pain 0 Pain Right Now 0 My pain is intermittent and dull  In the last 24 hours, has pain interfered with the following? General activity 8 Relation with others 8 Enjoyment of life 8 What TIME of day is your pain at its worst? evening Sleep (in general) Fair  Pain is worse with: walking, bending, standing, and some activites Pain improves with: rest and medication Relief from Meds: 9  Family History  Problem Relation Age of Onset   Heart Problems Father    Hypertension Mother    Stroke Other    Social History   Socioeconomic History   Marital status: Single    Spouse name: Not on file   Number of children: Not on file   Years of education: Not on file   Highest education level: Not on file  Occupational History   Occupation: umemployed     Comment: Works in [place] Use   Smoking status: Never   Smokeless tobacco: Never  Vaping Use   Vaping Use: Never used  Substance and Sexual Activity   Alcohol use: Yes    Alcohol/week: 6.0 standard drinks of alcohol    Types: 6 Cans of beer per week    Comment: OCCASSIONALLY    Drug use:  No   Sexual activity: Yes  Other Topics Concern   Not on file  Social History Narrative   7 cups of caffeine a week    Social Determinants of Health   Financial Resource Strain: Not on file  Food Insecurity: Not on file  Transportation Needs: Not on file  Physical Activity: Not on file  Stress: Not on file  Social Connections: Not on file   Past Surgical History:  Procedure Laterality Date   [place]  2006   [place] N/A 02/17/2014   Procedure: [place];  Surgeon: Sinclair Grooms, MD;  Location: [facility] CATH LAB;  Service: Cardiovascular;  Laterality: N/A;   LUMBAR DISC SURGERY  2013   PERCUTANEOUS CORONARY STENT INTERVENTION (PCI-S)  02/17/2014   Procedure: PERCUTANEOUS CORONARY STENT INTERVENTION (PCI-S);  Surgeon: Sinclair Grooms, MD;  Location[facility] C[facility] LAB;  Service: Cardiovascular;;   TRACHEOSTOMY  05/2004   VENTRICULOSTOMY  2006   Past Surgical History:  Procedure Laterality Date   BACK SURGERY     BRAIN HEMATOMA EVACUATION  2006   LEFT HEART CATHETERIZATION WITH CORONARY ANGIOGRAM N/A 02/17/2014   Procedure: LEFT HEART CATHETERIZATION WITH CORONARY ANGIOGRAM;  Surgeon: Sinclair Grooms, MD;  Location: [facility] CATH LAB;  Service: Cardiovascular;  Laterality: N/A;   LUMBAR DISC SURGERY  2013   PERCUTANEOUS CORONARY STENT INTERVENTION (PCI-S)  02/17/2014   Procedure: PERCUTANEOUS CORONARY STENT INTERVENTION (PCI-S);  Surgeon: Sinclair Grooms, MD;  Location: [facility] CATH LAB;  Service: Cardiovascular;;   TRACHEOSTOMY  05/2004   VENTRICULOSTOMY  2006   Past Medical History:  Diagnosis Date   Acid reflux    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   Archie Endo 02/17/2014   Stroke ([place])  2006   "left leg weak since" (02/18/2014)   Ht '5\' 7"'$  (1.702 m)   Wt 189 lb (85.7 kg)   BMI 29.60 kg/m   Opioid Risk Score:   Fall Risk Score:  `1  Depression screen [facility] 2/9     05/04/2022   11:27 AM 04/03/2022    1:01 PM 12/14/2021    9:41 AM 11/13/2021    9:57 AM 10/18/2021    9:25 AM 09/05/2021   10:05 AM 07/25/2021   10:23 AM  Depression screen PHQ 2/9  Decreased Interest 0 0 0 0 0 0 0  Down, Depressed, Hopeless 0 0 0 0 0 0 0  PHQ - 2 Score 0 0 0 0 0 0 0    Review of Systems  Musculoskeletal:  Positive for back pain and gait problem.  All other systems reviewed and are negative.      Objective:   Physical Exam Vitals and nursing note reviewed.  Constitutional:      Appearance: Normal appearance.  Cardiovascular:     Rate and Rhythm: Normal rate and regular rhythm.     Pulses: Normal pulses.     Heart sounds: Normal heart sounds.  Pulmonary:     Effort: Pulmonary effort is normal.     Breath sounds: Normal breath sounds.  Musculoskeletal:     Cervical back: Normal range of motion and neck supple.     Comments: Normal Muscle Bulk and Muscle Testing Reveals:  Upper Extremities: Full  ROM and Muscle Strength 5/5   Lumbar Paraspinal Tenderness: L-4-L-5 Lower Extremities: Full ROM and Muscle Strength 5/5 Arises from Table slowly using cane for support Antalgic Gait     Skin:    General: Skin is warm and dry.  Neurological:     Mental Status: He is alert and oriented to person, place, and time.  Psychiatric:        Mood and Affect: Mood normal.        Behavior: Behavior normal.         Assessment & Plan:  1. History of multiple strokes with gait disorder, ongoing ataxia and spastic left hemiparesis: Continue  HEP as Tolerated. 06/06/2022 2. Chronic low back pain with documented facet arthropathy and history of intradural defect/schwanomma requiring resection, per Dr. Naaman Plummer Note.  Continue current medication regimen. Refilled: Percocet 10/325 one q6 prn #120.  02 28/2024  We will continue the opioid monitoring program, this consists of regular clinic visits, examinations, urine drug screen, pill counts as well as use of [place] Controlled Substance Reporting system. A 12 month History has been reviewed on the [place] Controlled Substance Reporting System on 06/06/2022  3. Hypertension: Educated on medication compliance, he will take his medication when he  gets home he states. He refuses ED evaluation, he will keep a blood pressure log and F/U with his PCP. Continue to Monitor.      F/U in 1 month

## 2022-06-07 NOTE — Telephone Encounter (Signed)
Request Reference Number: [id number]. OXYCOD/APAP TAB 10-'325MG'$  is approved through 12/05/2022. For further questions, call [employer] at [phone]

## 2022-06-19 DIAGNOSIS — I5020 Unspecified systolic (congestive) heart failure: Secondary | ICD-10-CM | POA: Diagnosis not present

## 2022-06-21 DIAGNOSIS — Z952 Presence of prosthetic heart valve: Secondary | ICD-10-CM | POA: Diagnosis not present

## 2022-06-21 DIAGNOSIS — Z01810 Encounter for preprocedural cardiovascular examination: Secondary | ICD-10-CM | POA: Diagnosis not present

## 2022-06-21 DIAGNOSIS — Z4502 Encounter for adjustment and management of automatic implantable cardiac defibrillator: Secondary | ICD-10-CM | POA: Diagnosis not present

## 2022-06-21 DIAGNOSIS — I5082 Biventricular heart failure: Secondary | ICD-10-CM | POA: Diagnosis not present

## 2022-06-22 ENCOUNTER — Encounter: Payer: Self-pay | Admitting: Registered Nurse

## 2022-06-22 ENCOUNTER — Encounter: Payer: Medicare Other | Attending: Physical Medicine & Rehabilitation | Admitting: Registered Nurse

## 2022-06-22 VITALS — BP 124/84 | HR 83 | Ht 67.0 in | Wt 183.8 lb

## 2022-06-22 DIAGNOSIS — M47816 Spondylosis without myelopathy or radiculopathy, lumbar region: Secondary | ICD-10-CM | POA: Insufficient documentation

## 2022-06-22 DIAGNOSIS — R269 Unspecified abnormalities of gait and mobility: Secondary | ICD-10-CM | POA: Insufficient documentation

## 2022-06-22 DIAGNOSIS — G894 Chronic pain syndrome: Secondary | ICD-10-CM | POA: Insufficient documentation

## 2022-06-22 DIAGNOSIS — Z5181 Encounter for therapeutic drug level monitoring: Secondary | ICD-10-CM | POA: Diagnosis not present

## 2022-06-22 DIAGNOSIS — Z79891 Long term (current) use of opiate analgesic: Secondary | ICD-10-CM | POA: Insufficient documentation

## 2022-06-22 MED ORDER — OXYCODONE-ACETAMINOPHEN 10-325 MG PO TABS: 1.0000 | ORAL_TABLET | Freq: Four times a day (QID) | ORAL | 0 refills | Status: DC | PRN

## 2022-06-22 NOTE — Progress Notes (Signed)
Subjective:    Patient ID: George Villa, male    DOB: Aug 09, 1965, 57 y.o.   MRN: [id number]  HPI: George Villa is a 57 y.o. male who returns for follow up appointment for chronic pain and medication refill. He states his  pain is located in his lower back. He rates his pain 8. His current exercise regime is walking, performing stretching exercises and riding his stationary bicycle for 30 minutes 5 days a week.   Mr. Elbers Morphine equivalent is 60.00 MME.   Last UDS was Performed on 05/04/2022, it was consistent.     Pain Inventory Average Pain 8 Pain Right Now 8 My pain is dull  In the last 24 hours, has pain interfered with the following? General activity 8 Relation with others 8 Enjoyment of life 8 What TIME of day is your pain at its worst? night Sleep (in general) Fair  Pain is worse with: walking and sitting Pain improves with: medication Relief from Meds: 9  Family History  Problem Relation Age of Onset   Heart Problems Father    Hypertension Mother    Stroke Other    Social History   Socioeconomic History   Marital status: Single    Spouse name: Not on file   Number of children: Not on file   Years of education: Not on file   Highest education level: Not on file  Occupational History   Occupation: umemployed     Comment: Works in [place] Use   Smoking status: Never   Smokeless tobacco: Never  Vaping Use   Vaping Use: Never used  Substance and Sexual Activity   Alcohol use: Yes    Alcohol/week: 6.0 standard drinks of alcohol    Types: 6 Cans of beer per week    Comment: OCCASSIONALLY    Drug use: No   Sexual activity: Yes  Other Topics Concern   Not on file  Social History Narrative   7 cups of caffeine a week    Social Determinants of Health   Financial Resource Strain: Not on file  Food Insecurity: Not on file  Transportation Needs: Not on file  Physical Activity: Not on file  Stress: Not on file  Social  Connections: Not on file   Past Surgical History:  Procedure Laterality Date   [place]  2006   [place] N/A 02/17/2014   Procedure: [place];  Surgeon: Sinclair Grooms, MD;  Location: [facility] CATH LAB;  Service: Cardiovascular;  Laterality: N/A;   LUMBAR DISC SURGERY  2013   PERCUTANEOUS CORONARY STENT INTERVENTION (PCI-S)  02/17/2014   Procedure: PERCUTANEOUS CORONARY STENT INTERVENTION (PCI-S);  Surgeon: Sinclair Grooms, MD;  Location: [facility] CATH LAB;  Service: Cardiovascular;;   TRACHEOSTOMY  05/2004   VENTRICULOSTOMY  2006   Past Surgical History:  Procedure Laterality Date   BACK SURGERY     BRAIN HEMATOMA EVACUATION  2006   LEFT HEART CATHETERIZATION WITH CORONARY ANGIOGRAM N/A 02/17/2014   Procedure: LEFT HEART CATHETERIZATION WITH CORONARY ANGIOGRAM;  Surgeon: Sinclair Grooms, MD;  Location: [facility] CATH LAB;  Service: Cardiovascular;  Laterality: N/A;   LUMBAR DISC SURGERY  2013   PERCUTANEOUS CORONARY STENT INTERVENTION (PCI-S)  02/17/2014   Procedure: PERCUTANEOUS CORONARY STENT INTERVENTION (PCI-S);  Surgeon: Sinclair Grooms, MD;  Location: [facility] CATH LAB;  Service: Cardiovascular;;   TRACHEOSTOMY  05/2004   VENTRICULOSTOMY  2006   Past Medical History:  Diagnosis Date   Acid reflux    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   Archie Endo 02/17/2014   Stroke ([place]) 2006   "left leg weak since" (02/18/2014)   BP 124/84   Pulse 83   Ht 5\' 7"  (1.702 m)   Wt 183 lb 12.8 oz (83.4 kg)   SpO2 96%   BMI 28.79 kg/m   Opioid Risk Score:   Fall Risk Score:  `1  Depression screen [facility] 2/9     06/06/2022   11:00 AM 05/04/2022   11:27 AM 04/03/2022    1:01 PM 12/14/2021    9:41 AM 11/13/2021     9:57 AM 10/18/2021    9:25 AM 09/05/2021   10:05 AM  Depression screen PHQ 2/9  Decreased Interest 0 0 0 0 0 0 0  Down, Depressed, Hopeless 0 0 0 0 0 0 0  PHQ - 2 Score 0 0 0 0 0 0 0     Review of Systems  Constitutional: Negative.   HENT: Negative.    Eyes: Negative.   Respiratory: Negative.    Cardiovascular: Negative.   Gastrointestinal: Negative.   Endocrine: Negative.   Genitourinary: Negative.   Musculoskeletal:  Positive for back pain.  Skin: Negative.   Allergic/Immunologic: Negative.   Neurological: Negative.   Hematological:  Bruises/bleeds easily.       Warfarin  Psychiatric/Behavioral: Negative.    All other systems reviewed and are negative.      Objective:   Physical Exam Vitals and nursing note reviewed.  Constitutional:      Appearance: Normal appearance.  Cardiovascular:     Rate and Rhythm: Normal rate and regular rhythm.     Pulses: Normal pulses.     Heart sounds: Normal heart sounds.  Pulmonary:     Effort: Pulmonary effort is normal.     Breath sounds: Normal breath sounds.  Musculoskeletal:     Cervical back: Normal range of motion and neck supple.     Comments: Normal Muscle Bulk and Muscle Testing Reveals:  Upper Extremities: Full ROM and Muscle Strength 5/5 , Lumbar Paraspinal Tenderness: L-3-L-5 Lower Extremities: Full ROM and Muscle Strength 5/5 Arises from Table slowly using cane for support Abnormality of Gait     Skin:    General: Skin is warm and dry.  Neurological:     Mental Status: He is alert and oriented to person, place, and time.  Psychiatric:        Mood and Affect: Mood normal.        Behavior: Behavior normal.         Assessment & Plan:  1. History of multiple strokes with gait disorder, ongoing ataxia and spastic left hemiparesis: Continue  HEP as Tolerated. 06/22/2022 2. Chronic low back pain with documented facet arthropathy and history of intradural defect/schwanomma requiring resection, per Dr. Naaman Plummer  Note.  Continue current medication regimen. Refilled: Percocet 10/325 one q6 prn #120. 03 15/2024  We will continue the opioid monitoring program, this consists of regular clinic visits, examinations, urine drug screen, pill counts as well as use of [place] Controlled Substance Reporting system. A 12 month History has been reviewed on the [place] Controlled Substance Reporting System on 06/22/2022  F/U in 1 month

## 2022-06-29 ENCOUNTER — Encounter: Payer: Medicare Other | Admitting: Registered Nurse

## 2022-07-04 DIAGNOSIS — I517 Cardiomegaly: Secondary | ICD-10-CM | POA: Diagnosis not present

## 2022-07-04 DIAGNOSIS — I77810 Thoracic aortic ectasia: Secondary | ICD-10-CM | POA: Diagnosis not present

## 2022-07-04 DIAGNOSIS — I2510 Atherosclerotic heart disease of native coronary artery without angina pectoris: Secondary | ICD-10-CM | POA: Diagnosis not present

## 2022-07-04 DIAGNOSIS — Z01810 Encounter for preprocedural cardiovascular examination: Secondary | ICD-10-CM | POA: Diagnosis not present

## 2022-07-04 DIAGNOSIS — Z952 Presence of prosthetic heart valve: Secondary | ICD-10-CM | POA: Diagnosis not present

## 2022-07-04 DIAGNOSIS — J9811 Atelectasis: Secondary | ICD-10-CM | POA: Diagnosis not present

## 2022-07-04 DIAGNOSIS — I5082 Biventricular heart failure: Secondary | ICD-10-CM | POA: Diagnosis not present

## 2022-07-17 DIAGNOSIS — I428 Other cardiomyopathies: Secondary | ICD-10-CM | POA: Diagnosis not present

## 2022-07-17 DIAGNOSIS — I110 Hypertensive heart disease with heart failure: Secondary | ICD-10-CM | POA: Diagnosis not present

## 2022-07-17 DIAGNOSIS — I798 Other disorders of arteries, arterioles and capillaries in diseases classified elsewhere: Secondary | ICD-10-CM | POA: Diagnosis not present

## 2022-07-17 DIAGNOSIS — E1122 Type 2 diabetes mellitus with diabetic chronic kidney disease: Secondary | ICD-10-CM | POA: Diagnosis not present

## 2022-07-17 DIAGNOSIS — J9811 Atelectasis: Secondary | ICD-10-CM | POA: Diagnosis not present

## 2022-07-17 DIAGNOSIS — Z4682 Encounter for fitting and adjustment of non-vascular catheter: Secondary | ICD-10-CM | POA: Diagnosis not present

## 2022-07-17 DIAGNOSIS — I5022 Chronic systolic (congestive) heart failure: Secondary | ICD-10-CM | POA: Diagnosis not present

## 2022-07-17 DIAGNOSIS — N183 Chronic kidney disease, stage 3 unspecified: Secondary | ICD-10-CM | POA: Diagnosis not present

## 2022-07-17 DIAGNOSIS — I5084 End stage heart failure: Secondary | ICD-10-CM | POA: Diagnosis not present

## 2022-07-17 DIAGNOSIS — T82857A Stenosis of cardiac prosthetic devices, implants and grafts, initial encounter: Secondary | ICD-10-CM | POA: Diagnosis not present

## 2022-07-17 DIAGNOSIS — Z452 Encounter for adjustment and management of vascular access device: Secondary | ICD-10-CM | POA: Diagnosis not present

## 2022-07-17 DIAGNOSIS — Z95811 Presence of heart assist device: Secondary | ICD-10-CM | POA: Diagnosis not present

## 2022-07-17 DIAGNOSIS — I5189 Other ill-defined heart diseases: Secondary | ICD-10-CM | POA: Diagnosis not present

## 2022-07-17 DIAGNOSIS — Z9889 Other specified postprocedural states: Secondary | ICD-10-CM | POA: Diagnosis not present

## 2022-07-17 DIAGNOSIS — I5082 Biventricular heart failure: Secondary | ICD-10-CM | POA: Diagnosis not present

## 2022-07-17 DIAGNOSIS — I361 Nonrheumatic tricuspid (valve) insufficiency: Secondary | ICD-10-CM | POA: Diagnosis not present

## 2022-07-17 DIAGNOSIS — E1159 Type 2 diabetes mellitus with other circulatory complications: Secondary | ICD-10-CM | POA: Diagnosis not present

## 2022-07-18 DIAGNOSIS — Z95811 Presence of heart assist device: Secondary | ICD-10-CM | POA: Diagnosis not present

## 2022-07-18 DIAGNOSIS — Z4682 Encounter for fitting and adjustment of non-vascular catheter: Secondary | ICD-10-CM | POA: Diagnosis not present

## 2022-07-18 DIAGNOSIS — Z95810 Presence of automatic (implantable) cardiac defibrillator: Secondary | ICD-10-CM | POA: Diagnosis not present

## 2022-07-19 DIAGNOSIS — Z4682 Encounter for fitting and adjustment of non-vascular catheter: Secondary | ICD-10-CM | POA: Diagnosis not present

## 2022-07-19 DIAGNOSIS — I5022 Chronic systolic (congestive) heart failure: Secondary | ICD-10-CM | POA: Diagnosis not present

## 2022-07-19 DIAGNOSIS — Z95811 Presence of heart assist device: Secondary | ICD-10-CM | POA: Diagnosis not present

## 2022-07-19 DIAGNOSIS — J811 Chronic pulmonary edema: Secondary | ICD-10-CM | POA: Diagnosis not present

## 2022-07-19 DIAGNOSIS — I517 Cardiomegaly: Secondary | ICD-10-CM | POA: Diagnosis not present

## 2022-07-19 DIAGNOSIS — Z452 Encounter for adjustment and management of vascular access device: Secondary | ICD-10-CM | POA: Diagnosis not present

## 2022-07-20 DIAGNOSIS — J90 Pleural effusion, not elsewhere classified: Secondary | ICD-10-CM | POA: Diagnosis not present

## 2022-07-20 DIAGNOSIS — Z950 Presence of cardiac pacemaker: Secondary | ICD-10-CM | POA: Diagnosis not present

## 2022-07-20 DIAGNOSIS — Z95811 Presence of heart assist device: Secondary | ICD-10-CM | POA: Diagnosis not present

## 2022-07-20 DIAGNOSIS — Z452 Encounter for adjustment and management of vascular access device: Secondary | ICD-10-CM | POA: Diagnosis not present

## 2022-07-20 DIAGNOSIS — I5022 Chronic systolic (congestive) heart failure: Secondary | ICD-10-CM | POA: Diagnosis not present

## 2022-07-20 DIAGNOSIS — I517 Cardiomegaly: Secondary | ICD-10-CM | POA: Diagnosis not present

## 2022-07-21 DIAGNOSIS — J90 Pleural effusion, not elsewhere classified: Secondary | ICD-10-CM | POA: Diagnosis not present

## 2022-07-21 DIAGNOSIS — Z452 Encounter for adjustment and management of vascular access device: Secondary | ICD-10-CM | POA: Diagnosis not present

## 2022-07-21 DIAGNOSIS — R918 Other nonspecific abnormal finding of lung field: Secondary | ICD-10-CM | POA: Diagnosis not present

## 2022-07-21 DIAGNOSIS — Z4682 Encounter for fitting and adjustment of non-vascular catheter: Secondary | ICD-10-CM | POA: Diagnosis not present

## 2022-07-22 DIAGNOSIS — Z452 Encounter for adjustment and management of vascular access device: Secondary | ICD-10-CM | POA: Diagnosis not present

## 2022-07-22 DIAGNOSIS — Z95810 Presence of automatic (implantable) cardiac defibrillator: Secondary | ICD-10-CM | POA: Diagnosis not present

## 2022-07-22 DIAGNOSIS — R918 Other nonspecific abnormal finding of lung field: Secondary | ICD-10-CM | POA: Diagnosis not present

## 2022-07-22 DIAGNOSIS — J9811 Atelectasis: Secondary | ICD-10-CM | POA: Diagnosis not present

## 2022-07-23 DIAGNOSIS — Z95811 Presence of heart assist device: Secondary | ICD-10-CM | POA: Diagnosis not present

## 2022-07-23 DIAGNOSIS — Z452 Encounter for adjustment and management of vascular access device: Secondary | ICD-10-CM | POA: Diagnosis not present

## 2022-07-23 DIAGNOSIS — I5022 Chronic systolic (congestive) heart failure: Secondary | ICD-10-CM | POA: Diagnosis not present

## 2022-07-23 DIAGNOSIS — Z4682 Encounter for fitting and adjustment of non-vascular catheter: Secondary | ICD-10-CM | POA: Diagnosis not present

## 2022-07-23 DIAGNOSIS — I517 Cardiomegaly: Secondary | ICD-10-CM | POA: Diagnosis not present

## 2022-07-23 DIAGNOSIS — Z951 Presence of aortocoronary bypass graft: Secondary | ICD-10-CM | POA: Diagnosis not present

## 2022-07-24 DIAGNOSIS — J811 Chronic pulmonary edema: Secondary | ICD-10-CM | POA: Diagnosis not present

## 2022-07-24 DIAGNOSIS — I5022 Chronic systolic (congestive) heart failure: Secondary | ICD-10-CM | POA: Diagnosis not present

## 2022-07-24 DIAGNOSIS — N183 Chronic kidney disease, stage 3 unspecified: Secondary | ICD-10-CM | POA: Diagnosis not present

## 2022-07-24 DIAGNOSIS — E1159 Type 2 diabetes mellitus with other circulatory complications: Secondary | ICD-10-CM | POA: Diagnosis not present

## 2022-07-24 DIAGNOSIS — Z95811 Presence of heart assist device: Secondary | ICD-10-CM | POA: Diagnosis not present

## 2022-07-24 DIAGNOSIS — R918 Other nonspecific abnormal finding of lung field: Secondary | ICD-10-CM | POA: Diagnosis not present

## 2022-07-24 DIAGNOSIS — I517 Cardiomegaly: Secondary | ICD-10-CM | POA: Diagnosis not present

## 2022-07-24 DIAGNOSIS — E1122 Type 2 diabetes mellitus with diabetic chronic kidney disease: Secondary | ICD-10-CM | POA: Diagnosis not present

## 2022-07-24 DIAGNOSIS — Z452 Encounter for adjustment and management of vascular access device: Secondary | ICD-10-CM | POA: Diagnosis not present

## 2022-07-25 DIAGNOSIS — J90 Pleural effusion, not elsewhere classified: Secondary | ICD-10-CM | POA: Diagnosis not present

## 2022-07-25 DIAGNOSIS — I517 Cardiomegaly: Secondary | ICD-10-CM | POA: Diagnosis not present

## 2022-07-25 DIAGNOSIS — Z95810 Presence of automatic (implantable) cardiac defibrillator: Secondary | ICD-10-CM | POA: Diagnosis not present

## 2022-07-25 DIAGNOSIS — J9811 Atelectasis: Secondary | ICD-10-CM | POA: Diagnosis not present

## 2022-07-25 DIAGNOSIS — I5022 Chronic systolic (congestive) heart failure: Secondary | ICD-10-CM | POA: Diagnosis not present

## 2022-07-26 DIAGNOSIS — I517 Cardiomegaly: Secondary | ICD-10-CM | POA: Diagnosis not present

## 2022-07-26 DIAGNOSIS — Z95811 Presence of heart assist device: Secondary | ICD-10-CM | POA: Diagnosis not present

## 2022-07-26 DIAGNOSIS — I5022 Chronic systolic (congestive) heart failure: Secondary | ICD-10-CM | POA: Diagnosis not present

## 2022-07-26 DIAGNOSIS — Z952 Presence of prosthetic heart valve: Secondary | ICD-10-CM | POA: Diagnosis not present

## 2022-07-26 NOTE — Progress Notes (Signed)
Subjective:    Patient ID: George Villa, male    DOB: 29-Jul-1965, 57 y.o.   MRN: [id number]  HPI: Damacio Weisgerber is a 57 y.o. male who returns for follow up appointment for chronic pain and medication refill. He states his pain is located in his lower back. He rates his pain 7.His  current exercise regime is walking , riding his stationary bicycle and performing stretching exercises.  Mr. Stites Morphine equivalent is 60.00 MME.   Last UDS was Performed on 05/04/2022, it was consistent.      Pain Inventory Average Pain 7 Pain Right Now 7 My pain is intermittent and dull  In the last 24 hours, has pain interfered with the following? General activity 7 Relation with others 7 Enjoyment of life 7 What TIME of day is your pain at its worst? evening Sleep (in general) Fair  Pain is worse with: walking and standing Pain improves with: therapy/exercise and medication Relief from Meds: 8  Family History  Problem Relation Age of Onset   Heart Problems Father    Hypertension Mother    Stroke Other    Social History   Socioeconomic History   Marital status: Single    Spouse name: Not on file   Number of children: Not on file   Years of education: Not on file   Highest education level: Not on file  Occupational History   Occupation: umemployed     Comment: Works in [employer]   Tobacco Use   Smoking status: Never   Smokeless tobacco: Never  Vaping Use   Vaping Use: Never used  Substance and Sexual Activity   Alcohol use: Yes    Alcohol/week: 6.0 standard drinks of alcohol    Types: 6 Cans of beer per week    Comment: OCCASSIONALLY    Drug use: No   Sexual activity: Yes  Other Topics Concern   Not on file  Social History Narrative   7 cups of caffeine a week    Social Determinants of Health   Financial Resource Strain: Not on file  Food Insecurity: Not on file  Transportation Needs: Not on file  Physical Activity: Not on file  Stress: Not on file   Social Connections: Not on file   Past Surgical History:  Procedure Laterality Date   BACK SURGERY     BRAIN HEMATOMA EVACUATION  2006   LEFT HEART CATHETERIZATION WITH CORONARY ANGIOGRAM N/A 02/17/2014   Procedure: LEFT HEART CATHETERIZATION WITH CORONARY ANGIOGRAM;  Surgeon: [name], MD;  Location: [facility] CATH LAB;  Service: Cardiovascular;  Laterality: N/A;   LUMBAR DISC SURGERY  2013   PERCUTANEOUS CORONARY STENT INTERVENTION (PCI-S)  02/17/2014   Procedure: PERCUTANEOUS CORONARY STENT INTERVENTION (PCI-S);  Surgeon: [name], MD;  Location: [facility] CATH LAB;  Service: Cardiovascular;;   TRACHEOSTOMY  05/2004   VENTRICULOSTOMY  2006   Past Surgical History:  Procedure Laterality Date   BACK SURGERY     BRAIN HEMATOMA EVACUATION  2006   LEFT HEART CATHETERIZATION WITH CORONARY ANGIOGRAM N/A 02/17/2014   Procedure: LEFT HEART CATHETERIZATION WITH CORONARY ANGIOGRAM;  Surgeon: [name], MD;  Location: [facility] CATH LAB;  Service: Cardiovascular;  Laterality: N/A;   LUMBAR DISC SURGERY  2013   PERCUTANEOUS CORONARY STENT INTERVENTION (PCI-S)  02/17/2014   Procedure: PERCUTANEOUS CORONARY STENT INTERVENTION (PCI-S);  Surgeon: [name], MD;  Location: [facility] CATH LAB;  Service: Cardiovascular;;   TRACHEOSTOMY  05/2004  VENTRICULOSTOMY  2006   Past Medical History:  Diagnosis Date   Acid reflux    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  Opioid Risk Score:   Fall Risk Score:  `1  Depression screen [facility] 2/9     06/06/2022   11:00 AM 05/04/2022   11:27 AM 04/03/2022    1:01 PM 12/14/2021    9:41 AM 11/13/2021    9:57 AM 10/18/2021    9:25 AM 09/05/2021   10:05 AM   Depression screen PHQ 2/9  Decreased Interest 0 0 0 0 0 0 0  Down, Depressed, Hopeless 0 0 0 0 0 0 0  PHQ - 2 Score 0 0 0 0 0 0 0    Review of Systems  Musculoskeletal:  Positive for back pain and gait problem.  All other systems reviewed and are negative.      Objective:   Physical Exam Vitals and nursing note reviewed.  Constitutional:      Appearance: Normal appearance.  Cardiovascular:     Rate and Rhythm: Normal rate and regular rhythm.     Pulses: Normal pulses.     Heart sounds: Normal heart sounds.  Pulmonary:     Effort: Pulmonary effort is normal.     Breath sounds: Normal breath sounds.  Musculoskeletal:     Cervical back: Normal range of motion and neck supple.     Comments: Normal Muscle Bulk and Muscle Testing Reveals:  Upper Extremities: Full ROM and Muscle Strength  5/5  Lumbar Paraspinal Tenderness: L-4-L-5 Lower Extremities: Full ROM and Muscle Strength 5/5 Arises chair slowly using cane for support Antalgic  Gait     Skin:    General: Skin is warm and dry.  Neurological:     Mental Status: He is alert and oriented to person, place, and time.  Psychiatric:        Mood and Affect: Mood normal.        Behavior: Behavior normal.         Assessment & Plan:  1. History of multiple strokes with gait disorder, ongoing ataxia and spastic left hemiparesis: Continue  HEP as Tolerated. 07/27/2022 2. Chronic low back pain with documented facet arthropathy and history of intradural defect/schwanomma requiring resection, per Dr. Riley Kill Note.  Continue current medication regimen. Refilled: Percocet 10/325 one q6 prn #120. 04 19/2024  We will continue the opioid monitoring program, this consists of regular clinic visits, examinations, urine drug screen, pill counts as well as use of [place] Controlled Substance Reporting system. A 12 month History has been reviewed on the [place] Controlled Substance Reporting System on 07/27/2022    F/U in 1  month

## 2022-07-27 ENCOUNTER — Encounter: Payer: Self-pay | Admitting: Registered Nurse

## 2022-07-27 ENCOUNTER — Encounter: Payer: Medicare Other | Attending: Physical Medicine & Rehabilitation | Admitting: Registered Nurse

## 2022-07-27 VITALS — BP 136/88 | HR 85 | Ht 67.0 in | Wt 191.0 lb

## 2022-07-27 DIAGNOSIS — R269 Unspecified abnormalities of gait and mobility: Secondary | ICD-10-CM | POA: Diagnosis not present

## 2022-07-27 DIAGNOSIS — Z5181 Encounter for therapeutic drug level monitoring: Secondary | ICD-10-CM | POA: Insufficient documentation

## 2022-07-27 DIAGNOSIS — M47816 Spondylosis without myelopathy or radiculopathy, lumbar region: Secondary | ICD-10-CM | POA: Insufficient documentation

## 2022-07-27 DIAGNOSIS — G894 Chronic pain syndrome: Secondary | ICD-10-CM | POA: Diagnosis not present

## 2022-07-27 DIAGNOSIS — Z79891 Long term (current) use of opiate analgesic: Secondary | ICD-10-CM | POA: Diagnosis not present

## 2022-07-27 DIAGNOSIS — I361 Nonrheumatic tricuspid (valve) insufficiency: Secondary | ICD-10-CM | POA: Diagnosis not present

## 2022-07-27 MED ORDER — OXYCODONE-ACETAMINOPHEN 10-325 MG PO TABS
1.0000 | ORAL_TABLET | Freq: Four times a day (QID) | ORAL | 0 refills | Status: DC | PRN
Start: 2022-07-27 — End: 2022-08-24

## 2022-07-29 DIAGNOSIS — J90 Pleural effusion, not elsewhere classified: Secondary | ICD-10-CM | POA: Diagnosis not present

## 2022-07-29 DIAGNOSIS — Z95810 Presence of automatic (implantable) cardiac defibrillator: Secondary | ICD-10-CM | POA: Diagnosis not present

## 2022-07-29 DIAGNOSIS — R918 Other nonspecific abnormal finding of lung field: Secondary | ICD-10-CM | POA: Diagnosis not present

## 2022-07-29 DIAGNOSIS — I517 Cardiomegaly: Secondary | ICD-10-CM | POA: Diagnosis not present

## 2022-07-30 DIAGNOSIS — Z95811 Presence of heart assist device: Secondary | ICD-10-CM | POA: Diagnosis not present

## 2022-07-30 DIAGNOSIS — I5022 Chronic systolic (congestive) heart failure: Secondary | ICD-10-CM | POA: Diagnosis not present

## 2022-07-31 DIAGNOSIS — Z95811 Presence of heart assist device: Secondary | ICD-10-CM | POA: Diagnosis not present

## 2022-07-31 DIAGNOSIS — I5022 Chronic systolic (congestive) heart failure: Secondary | ICD-10-CM | POA: Diagnosis not present

## 2022-08-01 DIAGNOSIS — I5022 Chronic systolic (congestive) heart failure: Secondary | ICD-10-CM | POA: Diagnosis not present

## 2022-08-01 DIAGNOSIS — Z95811 Presence of heart assist device: Secondary | ICD-10-CM | POA: Diagnosis not present

## 2022-08-02 DIAGNOSIS — I5022 Chronic systolic (congestive) heart failure: Secondary | ICD-10-CM | POA: Diagnosis not present

## 2022-08-02 DIAGNOSIS — Z95811 Presence of heart assist device: Secondary | ICD-10-CM | POA: Diagnosis not present

## 2022-08-03 DIAGNOSIS — I517 Cardiomegaly: Secondary | ICD-10-CM | POA: Diagnosis not present

## 2022-08-03 DIAGNOSIS — Z95811 Presence of heart assist device: Secondary | ICD-10-CM | POA: Diagnosis not present

## 2022-08-03 DIAGNOSIS — I5022 Chronic systolic (congestive) heart failure: Secondary | ICD-10-CM | POA: Diagnosis not present

## 2022-08-03 DIAGNOSIS — J90 Pleural effusion, not elsewhere classified: Secondary | ICD-10-CM | POA: Diagnosis not present

## 2022-08-03 DIAGNOSIS — Z950 Presence of cardiac pacemaker: Secondary | ICD-10-CM | POA: Diagnosis not present

## 2022-08-14 DIAGNOSIS — R0609 Other forms of dyspnea: Secondary | ICD-10-CM | POA: Diagnosis not present

## 2022-08-14 DIAGNOSIS — I5042 Chronic combined systolic (congestive) and diastolic (congestive) heart failure: Secondary | ICD-10-CM | POA: Diagnosis not present

## 2022-08-14 DIAGNOSIS — Z95811 Presence of heart assist device: Secondary | ICD-10-CM | POA: Diagnosis not present

## 2022-08-14 DIAGNOSIS — I5020 Unspecified systolic (congestive) heart failure: Secondary | ICD-10-CM | POA: Diagnosis not present

## 2022-08-14 DIAGNOSIS — Z7901 Long term (current) use of anticoagulants: Secondary | ICD-10-CM | POA: Diagnosis not present

## 2022-08-23 DIAGNOSIS — Z95810 Presence of automatic (implantable) cardiac defibrillator: Secondary | ICD-10-CM | POA: Diagnosis not present

## 2022-08-23 DIAGNOSIS — I517 Cardiomegaly: Secondary | ICD-10-CM | POA: Diagnosis not present

## 2022-08-23 DIAGNOSIS — Z952 Presence of prosthetic heart valve: Secondary | ICD-10-CM | POA: Diagnosis not present

## 2022-08-23 DIAGNOSIS — J90 Pleural effusion, not elsewhere classified: Secondary | ICD-10-CM | POA: Diagnosis not present

## 2022-08-24 ENCOUNTER — Encounter: Payer: Self-pay | Admitting: Registered Nurse

## 2022-08-24 ENCOUNTER — Encounter: Payer: Medicare Other | Attending: Physical Medicine & Rehabilitation | Admitting: Registered Nurse

## 2022-08-24 VITALS — BP 136/88 | HR 81 | Ht 67.0 in | Wt 190.0 lb

## 2022-08-24 DIAGNOSIS — R269 Unspecified abnormalities of gait and mobility: Secondary | ICD-10-CM | POA: Insufficient documentation

## 2022-08-24 DIAGNOSIS — M47816 Spondylosis without myelopathy or radiculopathy, lumbar region: Secondary | ICD-10-CM | POA: Diagnosis not present

## 2022-08-24 DIAGNOSIS — G894 Chronic pain syndrome: Secondary | ICD-10-CM | POA: Insufficient documentation

## 2022-08-24 DIAGNOSIS — Z5181 Encounter for therapeutic drug level monitoring: Secondary | ICD-10-CM | POA: Insufficient documentation

## 2022-08-24 DIAGNOSIS — Z79891 Long term (current) use of opiate analgesic: Secondary | ICD-10-CM | POA: Diagnosis not present

## 2022-08-24 MED ORDER — OXYCODONE-ACETAMINOPHEN 10-325 MG PO TABS
1.0000 | ORAL_TABLET | Freq: Four times a day (QID) | ORAL | 0 refills | Status: DC | PRN
Start: 2022-08-24 — End: 2022-10-05

## 2022-08-24 MED ORDER — OXYCODONE-ACETAMINOPHEN 10-325 MG PO TABS
1.0000 | ORAL_TABLET | Freq: Four times a day (QID) | ORAL | 0 refills | Status: DC | PRN
Start: 2022-08-24 — End: 2022-08-24

## 2022-08-24 NOTE — Progress Notes (Signed)
Subjective:    Patient ID: George Villa, male    DOB: May 31, 1965, 57 y.o.   MRN: [id number]  HPI: George Villa is a 57 y.o. male who returns for follow up appointment for chronic pain and medication refill. He states his pain is located in his lower back. He rates his pain 8. His current exercise regime is walking and performing stretching exercises.  George Villa Morphine equivalent is 60.00 MME.   UDS ordered today.    Pain Inventory Average Pain 8 Pain Right Now 8 My pain is dull  In the last 24 hours, has pain interfered with the following? General activity 8 Relation with others 8 Enjoyment of life 8 What TIME of day is your pain at its worst? varies Sleep (in general) Fair  Pain is worse with: walking and standing Pain improves with: rest and medication Relief from Meds: 9  Family History  Problem Relation Age of Onset   Heart Problems Father    Hypertension Mother    Stroke Other    Social History   Socioeconomic History   Marital status: Single    Spouse name: Not on file   Number of children: Not on file   Years of education: Not on file   Highest education level: Not on file  Occupational History   Occupation: umemployed     Comment: Works in [employer]   Tobacco Use   Smoking status: Never   Smokeless tobacco: Never  Vaping Use   Vaping Use: Never used  Substance and Sexual Activity   Alcohol use: Yes    Alcohol/week: 6.0 standard drinks of alcohol    Types: 6 Cans of beer per week    Comment: OCCASSIONALLY    Drug use: No   Sexual activity: Yes  Other Topics Concern   Not on file  Social History Narrative   7 cups of caffeine a week    Social Determinants of Health   Financial Resource Strain: Not on file  Food Insecurity: Not on file  Transportation Needs: Not on file  Physical Activity: Not on file  Stress: Not on file  Social Connections: Not on file   Past Surgical History:  Procedure Laterality Date   BACK SURGERY      BRAIN HEMATOMA EVACUATION  2006   LEFT HEART CATHETERIZATION WITH CORONARY ANGIOGRAM N/A 02/17/2014   Procedure: LEFT HEART CATHETERIZATION WITH CORONARY ANGIOGRAM;  Surgeon: [name], MD;  Location: [facility] CATH LAB;  Service: Cardiovascular;  Laterality: N/A;   LUMBAR DISC SURGERY  2013   PERCUTANEOUS CORONARY STENT INTERVENTION (PCI-S)  02/17/2014   Procedure: PERCUTANEOUS CORONARY STENT INTERVENTION (PCI-S);  Surgeon: [name], MD;  Location: [facility] CATH LAB;  Service: Cardiovascular;;   TRACHEOSTOMY  05/2004   VENTRICULOSTOMY  2006   Past Surgical History:  Procedure Laterality Date   BACK SURGERY     BRAIN HEMATOMA EVACUATION  2006   LEFT HEART CATHETERIZATION WITH CORONARY ANGIOGRAM N/A 02/17/2014   Procedure: LEFT HEART CATHETERIZATION WITH CORONARY ANGIOGRAM;  Surgeon: [name], MD;  Location: [facility] CATH LAB;  Service: Cardiovascular;  Laterality: N/A;   LUMBAR DISC SURGERY  2013   PERCUTANEOUS CORONARY STENT INTERVENTION (PCI-S)  02/17/2014   Procedure: PERCUTANEOUS CORONARY STENT INTERVENTION (PCI-S);  Surgeon: [name], MD;  Location: [facility] CATH LAB;  Service: Cardiovascular;;   TRACHEOSTOMY  05/2004   VENTRICULOSTOMY  2006   Past Medical History:  Diagnosis Date   Acid  reflux    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   [name] 02/17/2014   Stroke ([facility]) 2006   "left leg weak since" (02/18/2014)   BP 136/88   Pulse 81   Ht 5\' 7"  (1.702 m)   Wt 190 lb (86.2 kg)   SpO2 96%   BMI 29.76 kg/m   Opioid Risk Score:   Fall Risk Score:  `1  Depression screen [facility] 2/9     08/24/2022   10:47 AM 07/27/2022   10:59 AM 06/06/2022   11:00 AM 05/04/2022   11:27 AM 04/03/2022    1:01 PM 12/14/2021    9:41 AM 11/13/2021    9:57 AM  Depression screen [facility] 2/9  Decreased Interest 0 0  0 0 0 0 0  Down, Depressed, Hopeless 0 0 0 0 0 0 0  PHQ - 2 Score 0 0 0 0 0 0 0      Review of Systems  Musculoskeletal:  Positive for back pain and gait problem.  All other systems reviewed and are negative.     Objective:   Physical Exam Vitals and nursing note reviewed.  Constitutional:      Appearance: Normal appearance.  Cardiovascular:     Rate and Rhythm: Normal rate and regular rhythm.     Pulses: Normal pulses.     Heart sounds: Normal heart sounds.  Pulmonary:     Effort: Pulmonary effort is normal.     Breath sounds: Normal breath sounds.  Musculoskeletal:     Cervical back: Normal range of motion and neck supple.     Comments: Normal Muscle Bulk and Muscle Testing Reveals:  Upper Extremities: Full ROM and Muscle Strength 5/5 Lumbar Paraspinal Tenderness: L-3-L-5 Lower Extremities: Full ROM and Muscle Strength 5/5 Arises from Chair slowly using Gilmer Mor for support Antalgic  Gait     Skin:    General: Skin is warm and dry.  Neurological:     Mental Status: He is alert and oriented to person, place, and time.  Psychiatric:        Mood and Affect: Mood normal.        Behavior: Behavior normal.         Assessment & Plan:  1. History of multiple strokes with gait disorder, ongoing ataxia and spastic left hemiparesis: Continue  HEP as Tolerated. 08/24/2022 2. Chronic low back pain with documented facet arthropathy and history of intradural defect/schwanomma requiring resection, per Dr. Riley Kill Note.  Continue current medication regimen. Refilled: Percocet 10/325 one q6 prn #120. 08/24/2022  We will continue the opioid monitoring program, this consists of regular clinic visits, examinations, urine drug screen, pill counts as well as use of [place] Controlled Substance Reporting system. A 12 month History has been reviewed on the [place] Controlled Substance Reporting System on 08/24/2022    F/U in 1 month

## 2022-08-30 LAB — TOXASSURE SELECT,+ANTIDEPR,UR

## 2022-09-10 DIAGNOSIS — R7303 Prediabetes: Secondary | ICD-10-CM | POA: Diagnosis not present

## 2022-09-10 DIAGNOSIS — R351 Nocturia: Secondary | ICD-10-CM | POA: Diagnosis not present

## 2022-09-10 DIAGNOSIS — I639 Cerebral infarction, unspecified: Secondary | ICD-10-CM | POA: Diagnosis not present

## 2022-09-10 DIAGNOSIS — K219 Gastro-esophageal reflux disease without esophagitis: Secondary | ICD-10-CM | POA: Diagnosis not present

## 2022-09-10 DIAGNOSIS — I10 Essential (primary) hypertension: Secondary | ICD-10-CM | POA: Diagnosis not present

## 2022-09-10 DIAGNOSIS — Z6830 Body mass index (BMI) 30.0-30.9, adult: Secondary | ICD-10-CM | POA: Diagnosis not present

## 2022-09-10 DIAGNOSIS — N401 Enlarged prostate with lower urinary tract symptoms: Secondary | ICD-10-CM | POA: Diagnosis not present

## 2022-09-10 DIAGNOSIS — N529 Male erectile dysfunction, unspecified: Secondary | ICD-10-CM | POA: Diagnosis not present

## 2022-09-10 DIAGNOSIS — D539 Nutritional anemia, unspecified: Secondary | ICD-10-CM | POA: Diagnosis not present

## 2022-09-10 DIAGNOSIS — N183 Chronic kidney disease, stage 3 unspecified: Secondary | ICD-10-CM | POA: Diagnosis not present

## 2022-09-10 DIAGNOSIS — E789 Disorder of lipoprotein metabolism, unspecified: Secondary | ICD-10-CM | POA: Diagnosis not present

## 2022-09-13 ENCOUNTER — Telehealth: Payer: Self-pay | Admitting: *Deleted

## 2022-09-13 NOTE — Telephone Encounter (Signed)
Urine drug screen for this encounter is consistent for prescribed medication 

## 2022-09-18 DIAGNOSIS — I509 Heart failure, unspecified: Secondary | ICD-10-CM | POA: Diagnosis not present

## 2022-09-18 DIAGNOSIS — Z95811 Presence of heart assist device: Secondary | ICD-10-CM | POA: Diagnosis not present

## 2022-09-25 ENCOUNTER — Telehealth: Payer: Self-pay | Admitting: Registered Nurse

## 2022-09-25 NOTE — Telephone Encounter (Signed)
Medication list was reviewed.  George Villa has a prescription at the pharmacy , he was called no answer.  Left message to return call.

## 2022-09-25 NOTE — Telephone Encounter (Signed)
Patient is asking for refill on oxycodone. He took lat one yesterday.

## 2022-10-05 ENCOUNTER — Encounter: Payer: Medicare Other | Attending: Physical Medicine & Rehabilitation | Admitting: Registered Nurse

## 2022-10-05 ENCOUNTER — Encounter: Payer: Self-pay | Admitting: Registered Nurse

## 2022-10-05 VITALS — BP 145/93 | HR 75 | Ht 67.0 in | Wt 198.0 lb

## 2022-10-05 DIAGNOSIS — Z79891 Long term (current) use of opiate analgesic: Secondary | ICD-10-CM

## 2022-10-05 DIAGNOSIS — G894 Chronic pain syndrome: Secondary | ICD-10-CM | POA: Diagnosis not present

## 2022-10-05 DIAGNOSIS — R269 Unspecified abnormalities of gait and mobility: Secondary | ICD-10-CM

## 2022-10-05 DIAGNOSIS — Z5181 Encounter for therapeutic drug level monitoring: Secondary | ICD-10-CM | POA: Diagnosis not present

## 2022-10-05 DIAGNOSIS — M47816 Spondylosis without myelopathy or radiculopathy, lumbar region: Secondary | ICD-10-CM

## 2022-10-05 MED ORDER — OXYCODONE-ACETAMINOPHEN 10-325 MG PO TABS
1.0000 | ORAL_TABLET | Freq: Four times a day (QID) | ORAL | 0 refills | Status: DC | PRN
Start: 2022-10-05 — End: 2022-11-16

## 2022-10-05 NOTE — Progress Notes (Signed)
Subjective:    Patient ID: George Villa, male    DOB: [birth year], 57 y.o.   MRN: [id number]  HPI: George Villa is a 57 y.o. male who returns for follow up appointment for chronic pain and medication refill. He states his pain is located in his lower back. He rates his pain 8. His current exercise regime is walking, and riding his stationary bicycle for 30 minutes 5 days a week.   Mr. Bouyea Morphine equivalent is 60.00 MME.   Last UDS was Performed on 08/24/2022, it was consistent.     Pain Inventory Average Pain 8 Pain Right Now 8 My pain is dull  In the last 24 hours, has pain interfered with the following? General activity 7 Relation with others 7 Enjoyment of life 7 What TIME of day is your pain at its worst? night Sleep (in general) Fair  Pain is worse with: walking, bending, and standing Pain improves with: medication Relief from Meds: 9  Family History  Problem Relation Age of Onset   Heart Problems Father    Hypertension Mother    Stroke Other    Social History   Socioeconomic History   Marital status: Single    Spouse name: Not on file   Number of children: Not on file   Years of education: Not on file   Highest education level: Not on file  Occupational History   Occupation: umemployed     Comment: Works in [employer]   Tobacco Use   Smoking status: Never   Smokeless tobacco: Never  Vaping Use   Vaping Use: Never used  Substance and Sexual Activity   Alcohol use: Yes    Alcohol/week: 6.0 standard drinks of alcohol    Types: 6 Cans of beer per week    Comment: OCCASSIONALLY    Drug use: No   Sexual activity: Yes  Other Topics Concern   Not on file  Social History Narrative   7 cups of caffeine a week    Social Determinants of Health   Financial Resource Strain: Not on file  Food Insecurity: Not on file  Transportation Needs: Not on file  Physical Activity: Not on file  Stress: Not on file  Social Connections: Not on file   Past  Surgical History:  Procedure Laterality Date   BACK SURGERY     BRAIN HEMATOMA EVACUATION  2006   LEFT HEART CATHETERIZATION WITH CORONARY ANGIOGRAM N/A 02/17/2014   Procedure: LEFT HEART CATHETERIZATION WITH CORONARY ANGIOGRAM;  Surgeon: [name], MD;  Location: [facility] CATH LAB;  Service: Cardiovascular;  Laterality: N/A;   LUMBAR DISC SURGERY  2013   PERCUTANEOUS CORONARY STENT INTERVENTION (PCI-S)  02/17/2014   Procedure: PERCUTANEOUS CORONARY STENT INTERVENTION (PCI-S);  Surgeon: [name], MD;  Location: [facility] CATH LAB;  Service: Cardiovascular;;   TRACHEOSTOMY  05/2004   VENTRICULOSTOMY  2006   Past Surgical History:  Procedure Laterality Date   BACK SURGERY     BRAIN HEMATOMA EVACUATION  2006   LEFT HEART CATHETERIZATION WITH CORONARY ANGIOGRAM N/A 02/17/2014   Procedure: LEFT HEART CATHETERIZATION WITH CORONARY ANGIOGRAM;  Surgeon: [name], MD;  Location: [facility] CATH LAB;  Service: Cardiovascular;  Laterality: N/A;   LUMBAR DISC SURGERY  2013   PERCUTANEOUS CORONARY STENT INTERVENTION (PCI-S)  02/17/2014   Procedure: PERCUTANEOUS CORONARY STENT INTERVENTION (PCI-S);  Surgeon: [name], MD;  Location: [facility] CATH LAB;  Service: Cardiovascular;;   TRACHEOSTOMY  05/2004  VENTRICULOSTOMY  2006   Past Medical History:  Diagnosis Date   Acid reflux    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   [name] 02/17/2014   Stroke (HCC) 2006   "left leg weak since" (02/18/2014)   BP (!) 145/93   Pulse 75   Ht 5\' 7"  (1.702 m)   Wt 198 lb (89.8 kg)   SpO2 95%   BMI 31.01 kg/m   Opioid Risk Score:   Fall Risk Score:  `1  Depression screen [facility] 2/9     08/24/2022   10:47 AM 07/27/2022   10:59 AM 06/06/2022   11:00 AM 05/04/2022   11:27 AM 04/03/2022    1:01 PM 12/14/2021    9:41 AM  11/13/2021    9:57 AM  Depression screen PHQ 2/9  Decreased Interest 0 0 0 0 0 0 0  Down, Depressed, Hopeless 0 0 0 0 0 0 0  PHQ - 2 Score 0 0 0 0 0 0 0     Review of Systems  Musculoskeletal:  Positive for back pain.  All other systems reviewed and are negative.     Objective:   Physical Exam Vitals and nursing note reviewed.  Constitutional:      Appearance: Normal appearance.  Cardiovascular:     Rate and Rhythm: Normal rate and regular rhythm.     Pulses: Normal pulses.     Heart sounds: Normal heart sounds.  Pulmonary:     Effort: Pulmonary effort is normal.     Breath sounds: Normal breath sounds.  Musculoskeletal:     Cervical back: Normal range of motion and neck supple.     Comments: Normal Muscle Bulk and Muscle Testing Reveals:  Upper Extremities: Full ROM and Muscle Strength 5/5  Lumbar Paraspinal Tenderness: L-3-L-5 Lower Extremities: Full ROM and Muscle Strength 5/5 Arises from Table slowly using cane for support Antalgic Gait     Skin:    General: Skin is warm and dry.  Neurological:     Mental Status: He is alert and oriented to person, place, and time.  Psychiatric:        Mood and Affect: Mood normal.        Behavior: Behavior normal.          Assessment & Plan:  1. History of multiple strokes with gait disorder, ongoing ataxia and spastic left hemiparesis: Continue  HEP as Tolerated. 10/05/2022 2. Chronic low back pain with documented facet arthropathy and history of intradural defect/schwanomma requiring resection, per Dr. Riley Kill Note.  Continue current medication regimen. Refilled: Percocet 10/325 one q6 prn #120. 10/05/2022  We will continue the opioid monitoring program, this consists of regular clinic visits, examinations, urine drug screen, pill counts as well as use of [place] Controlled Substance Reporting system. A 12 month History has been reviewed on the [place] Controlled Substance Reporting System on 10/05/2022    F/U in  1 month

## 2022-10-16 DIAGNOSIS — I5042 Chronic combined systolic (congestive) and diastolic (congestive) heart failure: Secondary | ICD-10-CM | POA: Diagnosis not present

## 2022-10-16 DIAGNOSIS — I509 Heart failure, unspecified: Secondary | ICD-10-CM | POA: Diagnosis not present

## 2022-10-29 ENCOUNTER — Ambulatory Visit: Payer: Medicare Other | Admitting: Registered Nurse

## 2022-11-02 ENCOUNTER — Ambulatory Visit: Payer: Medicare Other | Admitting: Registered Nurse

## 2022-11-06 DIAGNOSIS — I517 Cardiomegaly: Secondary | ICD-10-CM | POA: Diagnosis not present

## 2022-11-06 DIAGNOSIS — Z95810 Presence of automatic (implantable) cardiac defibrillator: Secondary | ICD-10-CM | POA: Diagnosis not present

## 2022-11-06 DIAGNOSIS — Z4502 Encounter for adjustment and management of automatic implantable cardiac defibrillator: Secondary | ICD-10-CM | POA: Diagnosis not present

## 2022-11-06 DIAGNOSIS — I4901 Ventricular fibrillation: Secondary | ICD-10-CM | POA: Diagnosis not present

## 2022-11-15 DIAGNOSIS — Z95811 Presence of heart assist device: Secondary | ICD-10-CM | POA: Diagnosis not present

## 2022-11-15 DIAGNOSIS — I509 Heart failure, unspecified: Secondary | ICD-10-CM | POA: Diagnosis not present

## 2022-11-16 ENCOUNTER — Encounter: Payer: Self-pay | Admitting: Registered Nurse

## 2022-11-16 ENCOUNTER — Encounter: Payer: Medicare Other | Attending: Physical Medicine & Rehabilitation | Admitting: Registered Nurse

## 2022-11-16 VITALS — BP 144/94 | HR 75 | Ht 67.0 in | Wt 198.0 lb

## 2022-11-16 DIAGNOSIS — G894 Chronic pain syndrome: Secondary | ICD-10-CM

## 2022-11-16 DIAGNOSIS — Z5181 Encounter for therapeutic drug level monitoring: Secondary | ICD-10-CM

## 2022-11-16 DIAGNOSIS — R269 Unspecified abnormalities of gait and mobility: Secondary | ICD-10-CM | POA: Diagnosis not present

## 2022-11-16 DIAGNOSIS — Z79891 Long term (current) use of opiate analgesic: Secondary | ICD-10-CM | POA: Diagnosis not present

## 2022-11-16 DIAGNOSIS — M47816 Spondylosis without myelopathy or radiculopathy, lumbar region: Secondary | ICD-10-CM | POA: Diagnosis not present

## 2022-11-16 MED ORDER — OXYCODONE-ACETAMINOPHEN 10-325 MG PO TABS: 1.0000 | ORAL_TABLET | Freq: Four times a day (QID) | ORAL | 0 refills | Status: DC | PRN

## 2022-11-16 NOTE — Progress Notes (Signed)
Subjective:    Patient ID: George Villa, male    DOB: 05-10-1965, 57 y.o.   MRN: [id number]  HPI: George Villa is a 57 y.o. male who returns for follow up appointment for chronic pain and medication refill. He states his pain is located in his lower back pain. He rates his pain 8. His current exercise regime is walking, performing stretching exercises and riding his stationary bicycle 5 days a week for 30 minutes.  George Villa Morphine equivalent is 60.00 MME.   Last UDS was Performed on 08/24/2022, it was consistent.     Pain Inventory Average Pain 8 Pain Right Now 8 My pain is dull  In the last 24 hours, has pain interfered with the following? General activity 8 Relation with others 8 Enjoyment of life 8 What TIME of day is your pain at its worst? evening Sleep (in general) Fair  Pain is worse with: walking, bending, and standing Pain improves with: rest and medication Relief from Meds: 8  Family History  Problem Relation Age of Onset   Heart Problems Father    Hypertension Mother    Stroke Other    Social History   Socioeconomic History   Marital status: Single    Spouse name: Not on file   Number of children: Not on file   Years of education: Not on file   Highest education level: Not on file  Occupational History   Occupation: umemployed     Comment: Works in [employer]   Tobacco Use   Smoking status: Never   Smokeless tobacco: Never  Vaping Use   Vaping status: Never Used  Substance and Sexual Activity   Alcohol use: Yes    Alcohol/week: 6.0 standard drinks of alcohol    Types: 6 Cans of beer per week    Comment: OCCASSIONALLY    Drug use: No   Sexual activity: Yes  Other Topics Concern   Not on file  Social History Narrative   7 cups of caffeine a week    Social Determinants of Health   Financial Resource Strain: Not on file  Food Insecurity: Not on file  Transportation Needs: Not on file  Physical Activity: Not on file  Stress: Not  on file  Social Connections: Not on file   Past Surgical History:  Procedure Laterality Date   BACK SURGERY     BRAIN HEMATOMA EVACUATION  2006   LEFT HEART CATHETERIZATION WITH CORONARY ANGIOGRAM N/A 02/17/2014   Procedure: LEFT HEART CATHETERIZATION WITH CORONARY ANGIOGRAM;  Surgeon: [name], MD;  Location: [facility] CATH LAB;  Service: Cardiovascular;  Laterality: N/A;   LUMBAR DISC SURGERY  2013   PERCUTANEOUS CORONARY STENT INTERVENTION (PCI-S)  02/17/2014   Procedure: PERCUTANEOUS CORONARY STENT INTERVENTION (PCI-S);  Surgeon: [name], MD;  Location: [facility] CATH LAB;  Service: Cardiovascular;;   TRACHEOSTOMY  05/2004   VENTRICULOSTOMY  2006   Past Surgical History:  Procedure Laterality Date   BACK SURGERY     BRAIN HEMATOMA EVACUATION  2006   LEFT HEART CATHETERIZATION WITH CORONARY ANGIOGRAM N/A 02/17/2014   Procedure: LEFT HEART CATHETERIZATION WITH CORONARY ANGIOGRAM;  Surgeon: [name], MD;  Location: [facility] CATH LAB;  Service: Cardiovascular;  Laterality: N/A;   LUMBAR DISC SURGERY  2013   PERCUTANEOUS CORONARY STENT INTERVENTION (PCI-S)  02/17/2014   Procedure: PERCUTANEOUS CORONARY STENT INTERVENTION (PCI-S);  Surgeon: [name], MD;  Location: [facility] CATH LAB;  Service: Cardiovascular;;  TRACHEOSTOMY  05/2004   VENTRICULOSTOMY  2006   Past Medical History:  Diagnosis Date   Acid reflux    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   [name] 02/17/2014   Stroke (HCC) 2006   "left leg weak since" (02/18/2014)   BP (!) 144/94   Pulse 75   Ht 5\' 7"  (1.702 m)   Wt 198 lb (89.8 kg)   SpO2 95%   BMI 31.01 kg/m   Opioid Risk Score:   Fall Risk Score:  `1  Depression screen [facility] 2/9     08/24/2022   10:47 AM 07/27/2022   10:59 AM 06/06/2022   11:00 AM 05/04/2022   11:27  AM 04/03/2022    1:01 PM 12/14/2021    9:41 AM 11/13/2021    9:57 AM  Depression screen PHQ 2/9  Decreased Interest 0 0 0 0 0 0 0  Down, Depressed, Hopeless 0 0 0 0 0 0 0  PHQ - 2 Score 0 0 0 0 0 0 0     Review of Systems  Musculoskeletal:  Positive for back pain.  All other systems reviewed and are negative.     Objective:   Physical Exam Vitals and nursing note reviewed.  Constitutional:      Appearance: Normal appearance.  Cardiovascular:     Rate and Rhythm: Normal rate and regular rhythm.     Pulses: Normal pulses.     Heart sounds: Normal heart sounds.  Pulmonary:     Effort: Pulmonary effort is normal.     Breath sounds: Normal breath sounds.  Musculoskeletal:     Cervical back: Normal range of motion and neck supple.     Comments: Normal Muscle Bulk and Muscle Testing Reveals:  Upper Extremities: Full ROM and Muscle Strength 5/5  Lumbar Paraspinal Tenderness: L-4-L-5 Lower Extremities: Full ROM and Muscle Strength 5/5 Arises from chair slowly using cane for support Antalgic  Gait     Skin:    General: Skin is warm and dry.  Neurological:     Mental Status: He is alert and oriented to person, place, and time.  Psychiatric:        Mood and Affect: Mood normal.        Behavior: Behavior normal.         Assessment & Plan:  1. History of multiple strokes with gait disorder, ongoing ataxia and spastic left hemiparesis: Continue  HEP as Tolerated. 11/16/2022 2. Chronic low back pain with documented facet arthropathy and history of intradural defect/schwanomma requiring resection, per Dr. Riley Kill Note.  Continue current medication regimen. Refilled: Percocet 10/325 one q6 prn #120. 11/16/2022  We will continue the opioid monitoring program, this consists of regular clinic visits, examinations, urine drug screen, pill counts as well as use of [place] Controlled Substance Reporting system. A 12 month History has been reviewed on the [place] Controlled  Substance Reporting System on 11/16/2022    F/U in 1 month

## 2022-11-19 DIAGNOSIS — Z95810 Presence of automatic (implantable) cardiac defibrillator: Secondary | ICD-10-CM | POA: Diagnosis not present

## 2022-11-20 DIAGNOSIS — Z95811 Presence of heart assist device: Secondary | ICD-10-CM | POA: Diagnosis not present

## 2022-11-20 DIAGNOSIS — Z7901 Long term (current) use of anticoagulants: Secondary | ICD-10-CM | POA: Diagnosis not present

## 2022-11-23 DIAGNOSIS — Z4502 Encounter for adjustment and management of automatic implantable cardiac defibrillator: Secondary | ICD-10-CM | POA: Diagnosis not present

## 2022-11-27 DIAGNOSIS — Z7901 Long term (current) use of anticoagulants: Secondary | ICD-10-CM | POA: Diagnosis not present

## 2022-11-27 DIAGNOSIS — Z95811 Presence of heart assist device: Secondary | ICD-10-CM | POA: Diagnosis not present

## 2022-12-04 DIAGNOSIS — Z95811 Presence of heart assist device: Secondary | ICD-10-CM | POA: Diagnosis not present

## 2022-12-04 DIAGNOSIS — Z7901 Long term (current) use of anticoagulants: Secondary | ICD-10-CM | POA: Diagnosis not present

## 2022-12-11 DIAGNOSIS — Z95811 Presence of heart assist device: Secondary | ICD-10-CM | POA: Diagnosis not present

## 2022-12-11 DIAGNOSIS — Z7901 Long term (current) use of anticoagulants: Secondary | ICD-10-CM | POA: Diagnosis not present

## 2022-12-12 DIAGNOSIS — Z4502 Encounter for adjustment and management of automatic implantable cardiac defibrillator: Secondary | ICD-10-CM | POA: Diagnosis not present

## 2022-12-17 ENCOUNTER — Telehealth: Payer: Self-pay | Admitting: *Deleted

## 2022-12-17 DIAGNOSIS — M47816 Spondylosis without myelopathy or radiculopathy, lumbar region: Secondary | ICD-10-CM

## 2022-12-17 NOTE — Telephone Encounter (Signed)
Patient has f/u on Friday 9/13. He is asking if his Oxycodone refill could be sent in to generate PA? Per [facility] PA was good until 12/05/22.

## 2022-12-18 DIAGNOSIS — Z95811 Presence of heart assist device: Secondary | ICD-10-CM | POA: Diagnosis not present

## 2022-12-18 DIAGNOSIS — Z7901 Long term (current) use of anticoagulants: Secondary | ICD-10-CM | POA: Diagnosis not present

## 2022-12-18 MED ORDER — OXYCODONE-ACETAMINOPHEN 10-325 MG PO TABS
1.0000 | ORAL_TABLET | Freq: Four times a day (QID) | ORAL | 0 refills | Status: DC | PRN
Start: 2022-12-18 — End: 2023-01-18

## 2022-12-18 NOTE — Addendum Note (Signed)
Addended by: Jones Bales on: 12/18/2022 03:56 PM   Modules accepted: Orders

## 2022-12-18 NOTE — Telephone Encounter (Signed)
PMP was Reviewed.  Oxycodone e-scribed to pharmacy  Call placed to George Villa regarding the above, no answer. Left message to return the call.

## 2022-12-19 ENCOUNTER — Telehealth: Payer: Self-pay | Admitting: *Deleted

## 2022-12-19 NOTE — Telephone Encounter (Signed)
Outcome Approved today by [facility] Medicaid 2017 NCPDP Request Reference Number: [id number]. OXYCOD/APAP TAB 10-325MG  is approved through 06/18/2023. For further questions, call [employer] at [phone]. Authorization Expiration Date: 06/18/2023   Mr Betten notified.

## 2022-12-19 NOTE — Telephone Encounter (Signed)
Prior auth for oxycodone acetaminophen 10/325 #120 sent to [employer] via [employer].

## 2022-12-21 ENCOUNTER — Encounter: Payer: Self-pay | Admitting: Registered Nurse

## 2022-12-21 ENCOUNTER — Encounter: Payer: Medicare Other | Attending: Physical Medicine & Rehabilitation | Admitting: Registered Nurse

## 2022-12-21 VITALS — BP 135/87 | HR 78 | Ht 67.0 in | Wt 200.0 lb

## 2022-12-21 DIAGNOSIS — Z79891 Long term (current) use of opiate analgesic: Secondary | ICD-10-CM | POA: Insufficient documentation

## 2022-12-21 DIAGNOSIS — R269 Unspecified abnormalities of gait and mobility: Secondary | ICD-10-CM | POA: Insufficient documentation

## 2022-12-21 DIAGNOSIS — G894 Chronic pain syndrome: Secondary | ICD-10-CM | POA: Diagnosis not present

## 2022-12-21 DIAGNOSIS — Z5181 Encounter for therapeutic drug level monitoring: Secondary | ICD-10-CM | POA: Diagnosis not present

## 2022-12-21 DIAGNOSIS — M47816 Spondylosis without myelopathy or radiculopathy, lumbar region: Secondary | ICD-10-CM | POA: Diagnosis not present

## 2022-12-21 NOTE — Progress Notes (Signed)
Subjective:    Patient ID: George Villa, male    DOB: Aug 08, 1965, 57 y.o.   MRN: [id number]  HPI: George Villa is a 57 y.o. male who returns for follow up appointment for chronic pain and medication refill. He states his pain is located in his lower back pain. He rates his pain 8. His current exercise regime is walking, stationary bicycle for 30 minutes daily  and performing stretching exercises.  Mr. Foss Morphine equivalent is 60.00 MME.   UDS ordered today.     Pain Inventory Average Pain 8 Pain Right Now 8 My pain is dull  In the last 24 hours, has pain interfered with the following? General activity 8 Relation with others 8 Enjoyment of life 8 What TIME of day is your pain at its worst? night Sleep (in general) Fair  Pain is worse with: walking and bending Pain improves with: rest and medication Relief from Meds: 9  Family History  Problem Relation Age of Onset   Heart Problems Father    Hypertension Mother    Stroke Other    Social History   Socioeconomic History   Marital status: Single    Spouse name: Not on file   Number of children: Not on file   Years of education: Not on file   Highest education level: Not on file  Occupational History   Occupation: umemployed     Comment: Works in [employer]   Tobacco Use   Smoking status: Never   Smokeless tobacco: Never  Vaping Use   Vaping status: Never Used  Substance and Sexual Activity   Alcohol use: Yes    Alcohol/week: 6.0 standard drinks of alcohol    Types: 6 Cans of beer per week    Comment: OCCASSIONALLY    Drug use: No   Sexual activity: Yes  Other Topics Concern   Not on file  Social History Narrative   7 cups of caffeine a week    Social Determinants of Health   Financial Resource Strain: Not on file  Food Insecurity: Not on file  Transportation Needs: Not on file  Physical Activity: Not on file  Stress: Not on file  Social Connections: Not on file   Past Surgical History:   Procedure Laterality Date   BACK SURGERY     BRAIN HEMATOMA EVACUATION  2006   LEFT HEART CATHETERIZATION WITH CORONARY ANGIOGRAM N/A 02/17/2014   Procedure: LEFT HEART CATHETERIZATION WITH CORONARY ANGIOGRAM;  Surgeon: [name], MD;  Location: [facility] CATH LAB;  Service: Cardiovascular;  Laterality: N/A;   LUMBAR DISC SURGERY  2013   PERCUTANEOUS CORONARY STENT INTERVENTION (PCI-S)  02/17/2014   Procedure: PERCUTANEOUS CORONARY STENT INTERVENTION (PCI-S);  Surgeon: [name], MD;  Location: [facility] CATH LAB;  Service: Cardiovascular;;   TRACHEOSTOMY  05/2004   VENTRICULOSTOMY  2006   Past Surgical History:  Procedure Laterality Date   BACK SURGERY     BRAIN HEMATOMA EVACUATION  2006   LEFT HEART CATHETERIZATION WITH CORONARY ANGIOGRAM N/A 02/17/2014   Procedure: LEFT HEART CATHETERIZATION WITH CORONARY ANGIOGRAM;  Surgeon: [name], MD;  Location: [facility] CATH LAB;  Service: Cardiovascular;  Laterality: N/A;   LUMBAR DISC SURGERY  2013   PERCUTANEOUS CORONARY STENT INTERVENTION (PCI-S)  02/17/2014   Procedure: PERCUTANEOUS CORONARY STENT INTERVENTION (PCI-S);  Surgeon: [name], MD;  Location: [facility] CATH LAB;  Service: Cardiovascular;;   TRACHEOSTOMY  05/2004   VENTRICULOSTOMY  2006  Past Medical History:  Diagnosis Date   Acid reflux    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   [name] 02/17/2014   Stroke (HCC) 2006   "left leg weak since" (02/18/2014)   BP 135/87   Pulse 78   Ht 5\' 7"  (1.702 m)   Wt 200 lb (90.7 kg)   SpO2 96%   BMI 31.32 kg/m   Opioid Risk Score:   Fall Risk Score:  `1  Depression screen [facility] 2/9     08/24/2022   10:47 AM 07/27/2022   10:59 AM 06/06/2022   11:00 AM 05/04/2022   11:27 AM 04/03/2022    1:01 PM 12/14/2021    9:41 AM 11/13/2021    9:57 AM   Depression screen PHQ 2/9  Decreased Interest 0 0 0 0 0 0 0  Down, Depressed, Hopeless 0 0 0 0 0 0 0  PHQ - 2 Score 0 0 0 0 0 0 0     Review of Systems  Musculoskeletal:  Positive for back pain.  All other systems reviewed and are negative.     Objective:   Physical Exam Vitals and nursing note reviewed.  Constitutional:      Appearance: Normal appearance.  Cardiovascular:     Rate and Rhythm: Normal rate and regular rhythm.     Pulses: Normal pulses.     Heart sounds: Normal heart sounds.  Pulmonary:     Effort: Pulmonary effort is normal.     Breath sounds: Normal breath sounds.  Musculoskeletal:     Cervical back: Normal range of motion and neck supple.     Comments: Normal Muscle Bulk and Muscle Testing Reveals:  Upper Extremities: Full ROM and Muscle Strength 5/5 Lumbar Paraspinal Tenderness: L-4-L-5 Lower Extremities: Full ROM and Muscle Strength 5/5 Arises from Chair slowly Abnormality Gait     Skin:    General: Skin is warm and dry.  Neurological:     Mental Status: He is alert and oriented to person, place, and time.  Psychiatric:        Mood and Affect: Mood normal.        Behavior: Behavior normal.          Assessment & Plan:  1.History of multiple strokes with gait disorder, ongoing ataxia and spastic left hemiparesis: Continue  HEP as Tolerated. 12/21/2022 2. Chronic low back pain with documented facet arthropathy and history of intradural defect/schwanomma requiring resection, per Dr. Riley Kill Note.  Continue current medication regimen. Refilled: Percocet 10/325 one q6 prn #120. 12/21/2022  We will continue the opioid monitoring program, this consists of regular clinic visits, examinations, urine drug screen, pill counts as well as use of [place] Controlled Substance Reporting system. A 12 month History has been reviewed on the [place] Controlled Substance Reporting System on 12/21/2022    F/U in 1 month

## 2022-12-25 DIAGNOSIS — N1832 Chronic kidney disease, stage 3b: Secondary | ICD-10-CM | POA: Diagnosis not present

## 2022-12-25 DIAGNOSIS — Z7901 Long term (current) use of anticoagulants: Secondary | ICD-10-CM | POA: Diagnosis not present

## 2022-12-25 DIAGNOSIS — I5042 Chronic combined systolic (congestive) and diastolic (congestive) heart failure: Secondary | ICD-10-CM | POA: Diagnosis not present

## 2022-12-25 DIAGNOSIS — Z95811 Presence of heart assist device: Secondary | ICD-10-CM | POA: Diagnosis not present

## 2022-12-25 DIAGNOSIS — I361 Nonrheumatic tricuspid (valve) insufficiency: Secondary | ICD-10-CM | POA: Diagnosis not present

## 2022-12-28 LAB — TOXASSURE SELECT,+ANTIDEPR,UR

## 2022-12-31 DIAGNOSIS — E876 Hypokalemia: Secondary | ICD-10-CM | POA: Diagnosis not present

## 2022-12-31 DIAGNOSIS — Z8679 Personal history of other diseases of the circulatory system: Secondary | ICD-10-CM | POA: Diagnosis not present

## 2022-12-31 DIAGNOSIS — Z950 Presence of cardiac pacemaker: Secondary | ICD-10-CM | POA: Diagnosis not present

## 2022-12-31 DIAGNOSIS — N179 Acute kidney failure, unspecified: Secondary | ICD-10-CM | POA: Diagnosis not present

## 2022-12-31 DIAGNOSIS — N189 Chronic kidney disease, unspecified: Secondary | ICD-10-CM | POA: Diagnosis not present

## 2022-12-31 DIAGNOSIS — Z4502 Encounter for adjustment and management of automatic implantable cardiac defibrillator: Secondary | ICD-10-CM | POA: Diagnosis not present

## 2022-12-31 DIAGNOSIS — J811 Chronic pulmonary edema: Secondary | ICD-10-CM | POA: Diagnosis not present

## 2022-12-31 DIAGNOSIS — Z01818 Encounter for other preprocedural examination: Secondary | ICD-10-CM | POA: Diagnosis not present

## 2022-12-31 DIAGNOSIS — I517 Cardiomegaly: Secondary | ICD-10-CM | POA: Diagnosis not present

## 2023-01-17 NOTE — Progress Notes (Deleted)
Subjective:    Patient ID: George Villa, male    DOB: Jan 04, 1966, 57 y.o.   MRN: [id number]  HPI   Pain Inventory Average Pain {NUMBERS; 0-10:5044} Pain Right Now {NUMBERS; 0-10:5044} My pain is {PAIN DESCRIPTION:21022940}  In the last 24 hours, has pain interfered with the following? General activity {NUMBERS; 0-10:5044} Relation with others {NUMBERS; 0-10:5044} Enjoyment of life {NUMBERS; 0-10:5044} What TIME of day is your pain at its worst? {time of day:24191} Sleep (in general) {BHH GOOD/FAIR/POOR:22877}  Pain is worse with: {ACTIVITIES:21022942} Pain improves with: {PAIN IMPROVES NFAO:13086578} Relief from Meds: {NUMBERS; 0-10:5044}  Family History  Problem Relation Age of Onset   Heart Problems Father    Hypertension Mother    Stroke Other    Social History   Socioeconomic History   Marital status: Single    Spouse name: Not on file   Number of children: Not on file   Years of education: Not on file   Highest education level: Not on file  Occupational History   Occupation: umemployed     Comment: Works in [employer]   Tobacco Use   Smoking status: Never   Smokeless tobacco: Never  Vaping Use   Vaping status: Never Used  Substance and Sexual Activity   Alcohol use: Yes    Alcohol/week: 6.0 standard drinks of alcohol    Types: 6 Cans of beer per week    Comment: OCCASSIONALLY    Drug use: No   Sexual activity: Yes  Other Topics Concern   Not on file  Social History Narrative   7 cups of caffeine a week    Social Determinants of Health   Financial Resource Strain: Not on file  Food Insecurity: Not on file  Transportation Needs: Not on file  Physical Activity: Not on file  Stress: Not on file  Social Connections: Not on file   Past Surgical History:  Procedure Laterality Date   BACK SURGERY     BRAIN HEMATOMA EVACUATION  2006   LEFT HEART CATHETERIZATION WITH CORONARY ANGIOGRAM N/A 02/17/2014   Procedure: LEFT HEART CATHETERIZATION WITH  CORONARY ANGIOGRAM;  Surgeon: [name], MD;  Location: [facility] CATH LAB;  Service: Cardiovascular;  Laterality: N/A;   LUMBAR DISC SURGERY  2013   PERCUTANEOUS CORONARY STENT INTERVENTION (PCI-S)  02/17/2014   Procedure: PERCUTANEOUS CORONARY STENT INTERVENTION (PCI-S);  Surgeon: [name], MD;  Location: [facility] CATH LAB;  Service: Cardiovascular;;   TRACHEOSTOMY  05/2004   VENTRICULOSTOMY  2006   Past Surgical History:  Procedure Laterality Date   BACK SURGERY     BRAIN HEMATOMA EVACUATION  2006   LEFT HEART CATHETERIZATION WITH CORONARY ANGIOGRAM N/A 02/17/2014   Procedure: LEFT HEART CATHETERIZATION WITH CORONARY ANGIOGRAM;  Surgeon: [name], MD;  Location: [facility] CATH LAB;  Service: Cardiovascular;  Laterality: N/A;   LUMBAR DISC SURGERY  2013   PERCUTANEOUS CORONARY STENT INTERVENTION (PCI-S)  02/17/2014   Procedure: PERCUTANEOUS CORONARY STENT INTERVENTION (PCI-S);  Surgeon: [name], MD;  Location: [facility] CATH LAB;  Service: Cardiovascular;;   TRACHEOSTOMY  05/2004   VENTRICULOSTOMY  2006   Past Medical History:  Diagnosis Date   Acid reflux    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  Opioid Risk Score:   Fall Risk Score:  `1  Depression screen [facility] 2/9     08/24/2022   10:47 AM 07/27/2022   10:59 AM 06/06/2022   11:00 AM 05/04/2022   11:27 AM 04/03/2022    1:01 PM 12/14/2021    9:41 AM 11/13/2021    9:57 AM  Depression screen PHQ 2/9  Decreased Interest 0 0 0 0 0 0 0  Down, Depressed, Hopeless 0 0 0 0 0 0 0  PHQ - 2 Score 0 0 0 0 0 0 0    Review of Systems     Objective:   Physical Exam        Assessment & Plan:

## 2023-01-18 ENCOUNTER — Encounter: Payer: Medicare Other | Attending: Physical Medicine & Rehabilitation | Admitting: Registered Nurse

## 2023-01-18 ENCOUNTER — Encounter: Payer: Self-pay | Admitting: Registered Nurse

## 2023-01-18 VITALS — BP 135/90 | HR 97 | Ht 67.0 in | Wt 202.8 lb

## 2023-01-18 DIAGNOSIS — R269 Unspecified abnormalities of gait and mobility: Secondary | ICD-10-CM | POA: Diagnosis not present

## 2023-01-18 DIAGNOSIS — G894 Chronic pain syndrome: Secondary | ICD-10-CM | POA: Diagnosis not present

## 2023-01-18 DIAGNOSIS — Z5181 Encounter for therapeutic drug level monitoring: Secondary | ICD-10-CM | POA: Diagnosis not present

## 2023-01-18 DIAGNOSIS — M47816 Spondylosis without myelopathy or radiculopathy, lumbar region: Secondary | ICD-10-CM | POA: Diagnosis not present

## 2023-01-18 DIAGNOSIS — Z79891 Long term (current) use of opiate analgesic: Secondary | ICD-10-CM

## 2023-01-18 MED ORDER — OXYCODONE-ACETAMINOPHEN 10-325 MG PO TABS: 1.0000 | ORAL_TABLET | Freq: Four times a day (QID) | ORAL | 0 refills | Status: DC | PRN

## 2023-01-18 NOTE — Progress Notes (Deleted)
Pain Inventory Average Pain 8 Pain Right Now 8 My pain is dull  In the last 24 hours, has pain interfered with the following? General activity 8 Relation with others 8 Enjoyment of life 8 What TIME of day is your pain at its worst? night Sleep (in general) Fair  Pain is worse with: walking and bending Pain improves with: medication Relief from Meds: 9  Family History  Problem Relation Age of Onset   Heart Problems Father    Hypertension Mother    Stroke Other    Social History   Socioeconomic History   Marital status: Single    Spouse name: Not on file   Number of children: Not on file   Years of education: Not on file   Highest education level: Not on file  Occupational History   Occupation: umemployed     Comment: Works in [employer]   Tobacco Use   Smoking status: Never   Smokeless tobacco: Never  Vaping Use   Vaping status: Never Used  Substance and Sexual Activity   Alcohol use: Yes    Alcohol/week: 6.0 standard drinks of alcohol    Types: 6 Cans of beer per week    Comment: OCCASSIONALLY    Drug use: No   Sexual activity: Yes  Other Topics Concern   Not on file  Social History Narrative   7 cups of caffeine a week    Social Determinants of Health   Financial Resource Strain: Not on file  Food Insecurity: Not on file  Transportation Needs: Not on file  Physical Activity: Not on file  Stress: Not on file  Social Connections: Not on file   Past Surgical History:  Procedure Laterality Date   BACK SURGERY     BRAIN HEMATOMA EVACUATION  2006   LEFT HEART CATHETERIZATION WITH CORONARY ANGIOGRAM N/A 02/17/2014   Procedure: LEFT HEART CATHETERIZATION WITH CORONARY ANGIOGRAM;  Surgeon: [name], MD;  Location: [facility] CATH LAB;  Service: Cardiovascular;  Laterality: N/A;   LUMBAR DISC SURGERY  2013   PERCUTANEOUS CORONARY STENT INTERVENTION (PCI-S)  02/17/2014   Procedure: PERCUTANEOUS CORONARY STENT INTERVENTION (PCI-S);  Surgeon: [name],  MD;  Location: [facility] CATH LAB;  Service: Cardiovascular;;   TRACHEOSTOMY  05/2004   VENTRICULOSTOMY  2006   Past Surgical History:  Procedure Laterality Date   BACK SURGERY     BRAIN HEMATOMA EVACUATION  2006   LEFT HEART CATHETERIZATION WITH CORONARY ANGIOGRAM N/A 02/17/2014   Procedure: LEFT HEART CATHETERIZATION WITH CORONARY ANGIOGRAM;  Surgeon: [name], MD;  Location: [facility] CATH LAB;  Service: Cardiovascular;  Laterality: N/A;   LUMBAR DISC SURGERY  2013   PERCUTANEOUS CORONARY STENT INTERVENTION (PCI-S)  02/17/2014   Procedure: PERCUTANEOUS CORONARY STENT INTERVENTION (PCI-S);  Surgeon: [name], MD;  Location: [facility] CATH LAB;  Service: Cardiovascular;;   TRACHEOSTOMY  05/2004   VENTRICULOSTOMY  2006   Past Medical History:  Diagnosis Date   Acid reflux    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  Opioid Risk Score:   Fall Risk Score:  `1  Depression screen [facility] 2/9  08/24/2022   10:47 AM 07/27/2022   10:59 AM 06/06/2022   11:00 AM 05/04/2022   11:27 AM 04/03/2022    1:01 PM 12/14/2021    9:41 AM 11/13/2021    9:57 AM  Depression screen PHQ 2/9  Decreased Interest 0 0 0 0 0 0 0  Down, Depressed, Hopeless 0 0 0 0 0 0 0  PHQ - 2 Score 0 0 0 0 0 0 0

## 2023-01-18 NOTE — Progress Notes (Signed)
Subjective:    Patient ID: George Villa, male    DOB: 1965-06-02, 57 y.o.   MRN: [id number]  HPI: George Villa is a 57 y.o. male who returns for follow up appointment for chronic pain and medication refill. He states his pain is located in his lower back. He rates his pain 8. His current exercise regime is walking with his cane and riding his stationary bicycle for 30 minutes daily.  George Villa Morphine equivalent is 60.00 MME.   Last UDS was Performed on 12/21/2022, it was consistent.     Pain Inventory Average Pain 8 Pain Right Now 8 My pain is dull  In the last 24 hours, has pain interfered with the following? General activity 8 Relation with others 8 Enjoyment of life 8 What TIME of day is your pain at its worst? night Sleep (in general) Fair  Pain is worse with: walking and bending Pain improves with: medication Relief from Meds: 9  Family History  Problem Relation Age of Onset   Heart Problems Father    Hypertension Mother    Stroke Other    Social History   Socioeconomic History   Marital status: Single    Spouse name: Not on file   Number of children: Not on file   Years of education: Not on file   Highest education level: Not on file  Occupational History   Occupation: umemployed     Comment: Works in [employer]   Tobacco Use   Smoking status: Never   Smokeless tobacco: Never  Vaping Use   Vaping status: Never Used  Substance and Sexual Activity   Alcohol use: Yes    Alcohol/week: 6.0 standard drinks of alcohol    Types: 6 Cans of beer per week    Comment: OCCASSIONALLY    Drug use: No   Sexual activity: Yes  Other Topics Concern   Not on file  Social History Narrative   7 cups of caffeine a week    Social Determinants of Health   Financial Resource Strain: Not on file  Food Insecurity: Not on file  Transportation Needs: Not on file  Physical Activity: Not on file  Stress: Not on file  Social Connections: Not on file   Past  Surgical History:  Procedure Laterality Date   BACK SURGERY     BRAIN HEMATOMA EVACUATION  2006   LEFT HEART CATHETERIZATION WITH CORONARY ANGIOGRAM N/A 02/17/2014   Procedure: LEFT HEART CATHETERIZATION WITH CORONARY ANGIOGRAM;  Surgeon: [name], MD;  Location: [facility] CATH LAB;  Service: Cardiovascular;  Laterality: N/A;   LUMBAR DISC SURGERY  2013   PERCUTANEOUS CORONARY STENT INTERVENTION (PCI-S)  02/17/2014   Procedure: PERCUTANEOUS CORONARY STENT INTERVENTION (PCI-S);  Surgeon: [name], MD;  Location: [facility] CATH LAB;  Service: Cardiovascular;;   TRACHEOSTOMY  05/2004   VENTRICULOSTOMY  2006   Past Surgical History:  Procedure Laterality Date   BACK SURGERY     BRAIN HEMATOMA EVACUATION  2006   LEFT HEART CATHETERIZATION WITH CORONARY ANGIOGRAM N/A 02/17/2014   Procedure: LEFT HEART CATHETERIZATION WITH CORONARY ANGIOGRAM;  Surgeon: [name], MD;  Location: [facility] CATH LAB;  Service: Cardiovascular;  Laterality: N/A;   LUMBAR DISC SURGERY  2013   PERCUTANEOUS CORONARY STENT INTERVENTION (PCI-S)  02/17/2014   Procedure: PERCUTANEOUS CORONARY STENT INTERVENTION (PCI-S);  Surgeon: [name], MD;  Location: [facility] CATH LAB;  Service: Cardiovascular;;   TRACHEOSTOMY  05/2004   VENTRICULOSTOMY  2006   Past Medical History:  Diagnosis Date   Acid reflux    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  Opioid Risk Score:   Fall Risk Score:  `1  Depression screen [facility] 2/9     08/24/2022   10:47 AM 07/27/2022   10:59 AM 06/06/2022   11:00 AM 05/04/2022   11:27 AM 04/03/2022    1:01 PM 12/14/2021    9:41 AM 11/13/2021    9:57 AM  Depression screen PHQ 2/9  Decreased  Interest 0 0 0 0 0 0 0  Down, Depressed, Hopeless 0 0 0 0 0 0 0  PHQ - 2 Score 0 0 0 0 0 0 0     Review of Systems  Musculoskeletal:  Positive for back pain.       Objective:   Physical Exam Vitals and nursing note reviewed.  Constitutional:      Appearance: Normal appearance.  Cardiovascular:     Rate and Rhythm: Normal rate and regular rhythm.     Pulses: Normal pulses.     Heart sounds: Normal heart sounds.  Pulmonary:     Effort: Pulmonary effort is normal.     Breath sounds: Normal breath sounds.  Musculoskeletal:     Cervical back: Normal range of motion and neck supple.     Comments: Normal Muscle Bulk and Muscle Testing Reveals:  Upper Extremities: Full ROM and Muscle Strength 5/5 Lumbar Paraspinal Tenderness: L-4-L-5 Lower Extremities: Full ROM and Muscle Strength 5/5 Arises from chair slowly using cane for support Antalgic  Gait     Skin:    General: Skin is warm and dry.  Neurological:     Mental Status: He is alert and oriented to person, place, and time.  Psychiatric:        Mood and Affect: Mood normal.        Behavior: Behavior normal.         Assessment & Plan:  1.History of multiple strokes with gait disorder, ongoing ataxia and spastic left hemiparesis: Continue  HEP as Tolerated. 01/18/2023 2. Chronic low back pain with documented facet arthropathy and history of intradural defect/schwanomma requiring resection, per Dr. Riley Kill Note.  Continue current medication regimen. Refilled: Percocet 10/325 one q6 prn #120. 01/18/2023  We will continue the opioid monitoring program, this consists of regular clinic visits, examinations, urine drug screen, pill counts as well as use of [place] Controlled Substance Reporting system. A 12 month History has been reviewed on the [place] Controlled Substance Reporting System on 01/18/2023    F/U in 1 month

## 2023-02-08 DIAGNOSIS — Z95811 Presence of heart assist device: Secondary | ICD-10-CM | POA: Diagnosis not present

## 2023-02-08 DIAGNOSIS — I472 Ventricular tachycardia, unspecified: Secondary | ICD-10-CM | POA: Diagnosis not present

## 2023-02-15 ENCOUNTER — Encounter: Payer: Medicare Other | Attending: Physical Medicine & Rehabilitation | Admitting: Registered Nurse

## 2023-02-15 ENCOUNTER — Encounter: Payer: Self-pay | Admitting: Registered Nurse

## 2023-02-15 VITALS — BP 126/85 | HR 71 | Ht 67.0 in | Wt 202.0 lb

## 2023-02-15 DIAGNOSIS — R269 Unspecified abnormalities of gait and mobility: Secondary | ICD-10-CM | POA: Insufficient documentation

## 2023-02-15 DIAGNOSIS — M47816 Spondylosis without myelopathy or radiculopathy, lumbar region: Secondary | ICD-10-CM | POA: Diagnosis not present

## 2023-02-15 DIAGNOSIS — Z79891 Long term (current) use of opiate analgesic: Secondary | ICD-10-CM | POA: Diagnosis not present

## 2023-02-15 DIAGNOSIS — Z5181 Encounter for therapeutic drug level monitoring: Secondary | ICD-10-CM | POA: Diagnosis not present

## 2023-02-15 DIAGNOSIS — G894 Chronic pain syndrome: Secondary | ICD-10-CM | POA: Diagnosis not present

## 2023-02-15 MED ORDER — OXYCODONE-ACETAMINOPHEN 10-325 MG PO TABS
1.0000 | ORAL_TABLET | Freq: Four times a day (QID) | ORAL | 0 refills | Status: DC | PRN
Start: 2023-02-15 — End: 2023-03-15

## 2023-02-15 NOTE — Progress Notes (Signed)
Subjective:    Patient ID: George Villa, male    DOB: [birth year], 57 y.o.   MRN: [id number]  HPI: George Villa is a 57 y.o. male who returns for follow up appointment for chronic pain and medication refill. He states his  pain is located in his lower back . He rates his pain 9. His current exercise regime is walking, riding on his stationary bicycle for 30 minutes 5 days a week  and performing stretching exercises.  Mr. Pennison Morphine equivalent is 60.00 MME.  Last UDS was Performed on 12/21/2022, it was consistent.    Pain Inventory Average Pain 9 Pain Right Now 9 My pain is dull  In the last 24 hours, has pain interfered with the following? General activity 8 Relation with others 8 Enjoyment of life 8 What TIME of day is your pain at its worst? varies Sleep (in general) Fair  Pain is worse with: walking and bending Pain improves with: medication Relief from Meds: 9  Family History  Problem Relation Age of Onset   Heart Problems Father    Hypertension Mother    Stroke Other    Social History   Socioeconomic History   Marital status: Single    Spouse name: Not on file   Number of children: Not on file   Years of education: Not on file   Highest education level: Not on file  Occupational History   Occupation: umemployed     Comment: Works in [employer]   Tobacco Use   Smoking status: Never   Smokeless tobacco: Never  Vaping Use   Vaping status: Never Used  Substance and Sexual Activity   Alcohol use: Yes    Alcohol/week: 6.0 standard drinks of alcohol    Types: 6 Cans of beer per week    Comment: OCCASSIONALLY    Drug use: No   Sexual activity: Yes  Other Topics Concern   Not on file  Social History Narrative   7 cups of caffeine a week    Social Determinants of Health   Financial Resource Strain: Not on file  Food Insecurity: Not on file  Transportation Needs: Not on file  Physical Activity: Not on file  Stress: Not on file  Social  Connections: Not on file   Past Surgical History:  Procedure Laterality Date   BACK SURGERY     BRAIN HEMATOMA EVACUATION  2006   LEFT HEART CATHETERIZATION WITH CORONARY ANGIOGRAM N/A 02/17/2014   Procedure: LEFT HEART CATHETERIZATION WITH CORONARY ANGIOGRAM;  Surgeon: [name], MD;  Location: [facility] CATH LAB;  Service: Cardiovascular;  Laterality: N/A;   LUMBAR DISC SURGERY  2013   PERCUTANEOUS CORONARY STENT INTERVENTION (PCI-S)  02/17/2014   Procedure: PERCUTANEOUS CORONARY STENT INTERVENTION (PCI-S);  Surgeon: [name], MD;  Location: [facility] CATH LAB;  Service: Cardiovascular;;   TRACHEOSTOMY  05/2004   VENTRICULOSTOMY  2006   Past Surgical History:  Procedure Laterality Date   BACK SURGERY     BRAIN HEMATOMA EVACUATION  2006   LEFT HEART CATHETERIZATION WITH CORONARY ANGIOGRAM N/A 02/17/2014   Procedure: LEFT HEART CATHETERIZATION WITH CORONARY ANGIOGRAM;  Surgeon: [name], MD;  Location: [facility] CATH LAB;  Service: Cardiovascular;  Laterality: N/A;   LUMBAR DISC SURGERY  2013   PERCUTANEOUS CORONARY STENT INTERVENTION (PCI-S)  02/17/2014   Procedure: PERCUTANEOUS CORONARY STENT INTERVENTION (PCI-S);  Surgeon: [name], MD;  Location: [facility] CATH LAB;  Service: Cardiovascular;;   TRACHEOSTOMY  05/2004   VENTRICULOSTOMY  2006   Past Medical History:  Diagnosis Date   Acid reflux    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   [name] 02/17/2014   Stroke (HCC) 2006   "left leg weak since" (02/18/2014)   BP 126/85   Pulse 71   Ht 5\' 7"  (1.702 m)   Wt 202 lb (91.6 kg)   SpO2 95%   BMI 31.64 kg/m   Opioid Risk Score:   Fall Risk Score:  `1  Depression screen [facility] 2/9     02/15/2023   11:15 AM 08/24/2022   10:47 AM 07/27/2022   10:59 AM 06/06/2022   11:00 AM 05/04/2022    11:27 AM 04/03/2022    1:01 PM 12/14/2021    9:41 AM  Depression screen PHQ 2/9  Decreased Interest 0 0 0 0 0 0 0  Down, Depressed, Hopeless 0 0 0 0 0 0 0  PHQ - 2 Score 0 0 0 0 0 0 0      Review of Systems  Musculoskeletal:  Positive for back pain.  All other systems reviewed and are negative.     Objective:   Physical Exam Vitals and nursing note reviewed.  Constitutional:      Appearance: Normal appearance.  Cardiovascular:     Rate and Rhythm: Normal rate and regular rhythm.     Pulses: Normal pulses.     Heart sounds: Normal heart sounds.  Pulmonary:     Effort: Pulmonary effort is normal.     Breath sounds: Normal breath sounds.  Musculoskeletal:     Comments: Normal Muscle Bulk and Muscle Testing Reveals:  Upper Extremities: Full ROM and Muscle Strength 5/5  Lumbar Paraspinal Tenderness: L-3-L-5 Lower Extremities: Full ROM and Muscle Strength 5/5 Arises from Chair slowly Antalgic  Gait     Skin:    General: Skin is warm and dry.  Neurological:     Mental Status: He is alert and oriented to person, place, and time.  Psychiatric:        Mood and Affect: Mood normal.        Behavior: Behavior normal.         Assessment & Plan:  1.History of multiple strokes with gait disorder, ongoing ataxia and spastic left hemiparesis: Continue  HEP as Tolerated. 111/11/2022 2. Chronic low back pain with documented facet arthropathy and history of intradural defect/schwanomma requiring resection, per Dr. Riley Kill Note.  Continue current medication regimen. Refilled: Percocet 10/325 one q6 prn #120. 02/15/2023  We will continue the opioid monitoring program, this consists of regular clinic visits, examinations, urine drug screen, pill counts as well as use of [place] Controlled Substance Reporting system. A 12 month History has been reviewed on the [place] Controlled Substance Reporting System on 02/15/2023    F/U in 1 month

## 2023-03-15 ENCOUNTER — Encounter: Payer: Medicare Other | Attending: Physical Medicine & Rehabilitation | Admitting: Registered Nurse

## 2023-03-15 ENCOUNTER — Encounter: Payer: Self-pay | Admitting: Registered Nurse

## 2023-03-15 VITALS — BP 132/85 | HR 97 | Ht 67.0 in | Wt 205.0 lb

## 2023-03-15 DIAGNOSIS — M47816 Spondylosis without myelopathy or radiculopathy, lumbar region: Secondary | ICD-10-CM | POA: Diagnosis not present

## 2023-03-15 DIAGNOSIS — Z5181 Encounter for therapeutic drug level monitoring: Secondary | ICD-10-CM | POA: Diagnosis not present

## 2023-03-15 DIAGNOSIS — R269 Unspecified abnormalities of gait and mobility: Secondary | ICD-10-CM | POA: Diagnosis not present

## 2023-03-15 DIAGNOSIS — G894 Chronic pain syndrome: Secondary | ICD-10-CM | POA: Diagnosis not present

## 2023-03-15 DIAGNOSIS — Z79891 Long term (current) use of opiate analgesic: Secondary | ICD-10-CM | POA: Insufficient documentation

## 2023-03-15 MED ORDER — OXYCODONE-ACETAMINOPHEN 10-325 MG PO TABS
1.0000 | ORAL_TABLET | Freq: Four times a day (QID) | ORAL | 0 refills | Status: DC | PRN
Start: 2023-03-15 — End: 2023-04-12

## 2023-03-15 NOTE — Progress Notes (Signed)
Subjective:    Patient ID: George Villa, male    DOB: 1965-12-16, 57 y.o.   MRN: [id number]  HPI: George Villa is a 57 y.o. male who returns for follow up appointment for chronic pain and medication refill. He states his pain is located in his lower back pain. He rates his pain 9. His current exercise regime is walking and performing stretching exercises.  [name] Morphine equivalent is 60.00 MME.   Last UDS was Performed on 12/21/2022, it was consistent.     Pain Inventory Average Pain 9 Pain Right Now 9 My pain is dull  In the last 24 hours, has pain interfered with the following? General activity 8 Relation with others 8 Enjoyment of life 8 What TIME of day is your pain at its worst? evening Sleep (in general) Fair  Pain is worse with: walking, bending, and sitting Pain improves with: rest and medication Relief from Meds: 8  Family History  Problem Relation Age of Onset   Heart Problems Father    Hypertension Mother    Stroke Other    Social History   Socioeconomic History   Marital status: Single    Spouse name: Not on file   Number of children: Not on file   Years of education: Not on file   Highest education level: Not on file  Occupational History   Occupation: umemployed     Comment: Works in [employer]   Tobacco Use   Smoking status: Never   Smokeless tobacco: Never  Vaping Use   Vaping status: Never Used  Substance and Sexual Activity   Alcohol use: Yes    Alcohol/week: 6.0 standard drinks of alcohol    Types: 6 Cans of beer per week    Comment: OCCASSIONALLY    Drug use: No   Sexual activity: Yes  Other Topics Concern   Not on file  Social History Narrative   7 cups of caffeine a week    Social Determinants of Health   Financial Resource Strain: Not on file  Food Insecurity: Not on file  Transportation Needs: Not on file  Physical Activity: Not on file  Stress: Not on file  Social Connections: Not on file   Past Surgical  History:  Procedure Laterality Date   BACK SURGERY     BRAIN HEMATOMA EVACUATION  2006   LEFT HEART CATHETERIZATION WITH CORONARY ANGIOGRAM N/A 02/17/2014   Procedure: LEFT HEART CATHETERIZATION WITH CORONARY ANGIOGRAM;  Surgeon: George Noe, MD;  Location: [facility] CATH LAB;  Service: Cardiovascular;  Laterality: N/A;   LUMBAR DISC SURGERY  2013   PERCUTANEOUS CORONARY STENT INTERVENTION (PCI-S)  02/17/2014   Procedure: PERCUTANEOUS CORONARY STENT INTERVENTION (PCI-S);  Surgeon: George Noe, MD;  Location: [facility] CATH LAB;  Service: Cardiovascular;;   TRACHEOSTOMY  05/2004   VENTRICULOSTOMY  2006   Past Surgical History:  Procedure Laterality Date   BACK SURGERY     BRAIN HEMATOMA EVACUATION  2006   LEFT HEART CATHETERIZATION WITH CORONARY ANGIOGRAM N/A 02/17/2014   Procedure: LEFT HEART CATHETERIZATION WITH CORONARY ANGIOGRAM;  Surgeon: George Noe, MD;  Location: [facility] CATH LAB;  Service: Cardiovascular;  Laterality: N/A;   LUMBAR DISC SURGERY  2013   PERCUTANEOUS CORONARY STENT INTERVENTION (PCI-S)  02/17/2014   Procedure: PERCUTANEOUS CORONARY STENT INTERVENTION (PCI-S);  Surgeon: George Noe, MD;  Location: [facility] CATH LAB;  Service: Cardiovascular;;   TRACHEOSTOMY  05/2004   VENTRICULOSTOMY  2006  Past Medical History:  Diagnosis Date   Acid reflux    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   George Villa 02/17/2014   Stroke (HCC) 2006   "left leg weak since" (02/18/2014)   BP 132/85   Pulse 97   Ht 5\' 7"  (1.702 m)   Wt 205 lb (93 kg)   SpO2 96%   BMI 32.11 kg/m   Opioid Risk Score:   Fall Risk Score:  `1  Depression screen [facility] 2/9     02/15/2023   11:15 AM 08/24/2022   10:47 AM 07/27/2022   10:59 AM 06/06/2022   11:00 AM 05/04/2022   11:27 AM 04/03/2022    1:01 PM 12/14/2021    9:41  AM  Depression screen PHQ 2/9  Decreased Interest 0 0 0 0 0 0 0  Down, Depressed, Hopeless 0 0 0 0 0 0 0  PHQ - 2 Score 0 0 0 0 0 0 0     Review of Systems     Objective:   Physical Exam Vitals and nursing note reviewed.  Constitutional:      Appearance: Normal appearance.  Cardiovascular:     Rate and Rhythm: Normal rate and regular rhythm.     Pulses: Normal pulses.     Heart sounds: Normal heart sounds.  Pulmonary:     Effort: Pulmonary effort is normal.     Breath sounds: Normal breath sounds.  Musculoskeletal:     Comments: Normal Muscle Bulk and Muscle Testing Reveals:  Upper Extremities: Full ROM and Muscle Strength 5/5 Lumbar Paraspinal Tenderness: L-3-L-5 Lower Extremities: Full ROM and Muscle Strength 5/5 Arises from Chair slowly using cane for support Antalgic   Gait     Skin:    General: Skin is warm and dry.  Neurological:     Mental Status: He is alert and oriented to person, place, and time.  Psychiatric:        Mood and Affect: Mood normal.        Behavior: Behavior normal.          Assessment & Plan:  1.History of multiple strokes with gait disorder, ongoing ataxia and spastic left hemiparesis: Continue  HEP as Tolerated. 03/15/2023 2. Chronic low back pain with documented facet arthropathy and history of intradural defect/schwanomma requiring resection, per Dr. Riley Kill Note.  Continue current medication regimen. Refilled: Percocet 10/325 one q6 prn #120. 03/15/2023  We will continue the opioid monitoring program, this consists of regular clinic visits, examinations, urine drug screen, pill counts as well as use of [place] Controlled Substance Reporting system. A 12 month History has been reviewed on the [place] Controlled Substance Reporting System on 03/15/2023    F/U in 1 month

## 2023-04-01 DIAGNOSIS — K760 Fatty (change of) liver, not elsewhere classified: Secondary | ICD-10-CM | POA: Diagnosis not present

## 2023-04-01 DIAGNOSIS — R918 Other nonspecific abnormal finding of lung field: Secondary | ICD-10-CM | POA: Diagnosis not present

## 2023-04-01 DIAGNOSIS — K5730 Diverticulosis of large intestine without perforation or abscess without bleeding: Secondary | ICD-10-CM | POA: Diagnosis not present

## 2023-04-01 DIAGNOSIS — Z954 Presence of other heart-valve replacement: Secondary | ICD-10-CM | POA: Diagnosis not present

## 2023-04-01 DIAGNOSIS — K6389 Other specified diseases of intestine: Secondary | ICD-10-CM | POA: Diagnosis not present

## 2023-04-11 DIAGNOSIS — R918 Other nonspecific abnormal finding of lung field: Secondary | ICD-10-CM | POA: Diagnosis not present

## 2023-04-11 DIAGNOSIS — Z4502 Encounter for adjustment and management of automatic implantable cardiac defibrillator: Secondary | ICD-10-CM | POA: Diagnosis not present

## 2023-04-11 DIAGNOSIS — E876 Hypokalemia: Secondary | ICD-10-CM | POA: Diagnosis not present

## 2023-04-11 DIAGNOSIS — I472 Ventricular tachycardia, unspecified: Secondary | ICD-10-CM | POA: Diagnosis not present

## 2023-04-12 ENCOUNTER — Encounter: Payer: Medicare Other | Attending: Physical Medicine & Rehabilitation | Admitting: Registered Nurse

## 2023-04-12 ENCOUNTER — Ambulatory Visit: Payer: Medicare Other | Admitting: Registered Nurse

## 2023-04-12 ENCOUNTER — Encounter: Payer: Self-pay | Admitting: Registered Nurse

## 2023-04-12 VITALS — BP 124/85 | HR 99 | Ht 67.0 in | Wt 207.0 lb

## 2023-04-12 DIAGNOSIS — M47816 Spondylosis without myelopathy or radiculopathy, lumbar region: Secondary | ICD-10-CM | POA: Insufficient documentation

## 2023-04-12 DIAGNOSIS — Z79891 Long term (current) use of opiate analgesic: Secondary | ICD-10-CM | POA: Diagnosis not present

## 2023-04-12 DIAGNOSIS — Z5181 Encounter for therapeutic drug level monitoring: Secondary | ICD-10-CM | POA: Diagnosis present

## 2023-04-12 DIAGNOSIS — R269 Unspecified abnormalities of gait and mobility: Secondary | ICD-10-CM | POA: Insufficient documentation

## 2023-04-12 DIAGNOSIS — G894 Chronic pain syndrome: Secondary | ICD-10-CM | POA: Diagnosis present

## 2023-04-12 MED ORDER — OXYCODONE-ACETAMINOPHEN 10-325 MG PO TABS
1.0000 | ORAL_TABLET | Freq: Four times a day (QID) | ORAL | 0 refills | Status: DC | PRN
Start: 2023-04-12 — End: 2023-05-13

## 2023-04-12 NOTE — Progress Notes (Signed)
 Subjective:    Patient ID: George Villa, male    DOB: Jan 23, 1966, 58 y.o.   MRN: [id number]  HPI: George Villa is a 58 y.o. male who returns for follow up appointment for chronic pain and medication refill. He states his pain is located in his lower back. He rates his pain 8. His current exercise regime is walking and performing stretching exercises.  George Villa Morphine  equivalent is 60.00 MME.   Last UDS was Performed on 12/21/2022, it was consistent.     Pain Inventory Average Pain 8 Pain Right Now 8 My pain is dull  In the last 24 hours, has pain interfered with the following? General activity 7 Relation with others 7 Enjoyment of life 7 What TIME of day is your pain at its worst? evening Sleep (in general) NA  Pain is worse with: walking and bending Pain improves with: pacing activities and medication Relief from Meds: 8  Family History  Problem Relation Age of Onset   Heart Problems Father    Hypertension Mother    Stroke Other    Social History   Socioeconomic History   Marital status: Single    Spouse name: Not on file   Number of children: Not on file   Years of education: Not on file   Highest education level: Not on file  Occupational History   Occupation: umemployed     Comment: Works in [employer]   Tobacco Use   Smoking status: Never   Smokeless tobacco: Never  Vaping Use   Vaping status: Never Used  Substance and Sexual Activity   Alcohol  use: Yes    Alcohol /week: 6.0 standard drinks of alcohol     Types: 6 Cans of beer per week    Comment: OCCASSIONALLY    Drug use: No   Sexual activity: Yes  Other Topics Concern   Not on file  Social History Narrative   7 cups of caffeine a week    Social Drivers of Corporate Investment Banker Strain: Not on file  Food Insecurity: Not on file  Transportation Needs: Not on file  Physical Activity: Not on file  Stress: Not on file  Social Connections: Not on file   Past Surgical History:   Procedure Laterality Date   BACK SURGERY     BRAIN HEMATOMA EVACUATION  2006   LEFT HEART CATHETERIZATION WITH CORONARY ANGIOGRAM N/A 02/17/2014   Procedure: LEFT HEART CATHETERIZATION WITH CORONARY ANGIOGRAM;  Surgeon: Victory LELON Sharps DOUGLAS, MD;  Location: [facility] CATH LAB;  Service: Cardiovascular;  Laterality: N/A;   LUMBAR DISC SURGERY  2013   PERCUTANEOUS CORONARY STENT INTERVENTION (PCI-S)  02/17/2014   Procedure: PERCUTANEOUS CORONARY STENT INTERVENTION (PCI-S);  Surgeon: Victory LELON Sharps DOUGLAS, MD;  Location: [facility] CATH LAB;  Service: Cardiovascular;;   TRACHEOSTOMY  05/2004   VENTRICULOSTOMY  2006   Past Surgical History:  Procedure Laterality Date   BACK SURGERY     BRAIN HEMATOMA EVACUATION  2006   LEFT HEART CATHETERIZATION WITH CORONARY ANGIOGRAM N/A 02/17/2014   Procedure: LEFT HEART CATHETERIZATION WITH CORONARY ANGIOGRAM;  Surgeon: Victory LELON Sharps DOUGLAS, MD;  Location: [facility] CATH LAB;  Service: Cardiovascular;  Laterality: N/A;   LUMBAR DISC SURGERY  2013   PERCUTANEOUS CORONARY STENT INTERVENTION (PCI-S)  02/17/2014   Procedure: PERCUTANEOUS CORONARY STENT INTERVENTION (PCI-S);  Surgeon: Victory LELON Sharps DOUGLAS, MD;  Location: [facility] CATH LAB;  Service: Cardiovascular;;   TRACHEOSTOMY  05/2004   VENTRICULOSTOMY  2006   Past  Medical History:  Diagnosis Date   Acid reflux    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 2/9     04/12/2023    3:03 PM 02/15/2023   11:15 AM 08/24/2022   10:47 AM 07/27/2022   10:59 AM 06/06/2022   11:00 AM 05/04/2022   11:27 AM 04/03/2022    1:01 PM   Depression screen PHQ 2/9  Decreased Interest 0 0 0 0 0 0 0  Down, Depressed, Hopeless 0 0 0 0 0 0 0  PHQ - 2 Score 0 0 0 0 0 0 0     Review of Systems  Musculoskeletal:  Positive for back pain and gait problem.  All other systems reviewed and are negative.      Objective:   Physical Exam Vitals and nursing note reviewed.  Constitutional:      Appearance: Normal appearance.  Cardiovascular:     Rate and Rhythm: Normal rate and regular rhythm.     Pulses: Normal pulses.     Heart sounds: Normal heart sounds.  Pulmonary:     Effort: Pulmonary effort is normal.     Breath sounds: Normal breath sounds.  Musculoskeletal:     Comments: Normal Muscle Bulk and Muscle Testing Reveals:  Upper Extremities: Full ROM and Muscle Strength 5/5 Lumbar Paraspinal Tenderness: L-4-L-5 Lower Extremities Full ROM and Muscle Strength 5/5 Arises from Table slowly using Cane for support Antalgic Gait     Skin:    General: Skin is warm and dry.  Neurological:     Mental Status: He is alert and oriented to person, place, and time.  Psychiatric:        Mood and Affect: Mood normal.        Behavior: Behavior normal.          Assessment & Plan:   1.History of multiple strokes with gait disorder, ongoing ataxia and spastic left hemiparesis: Continue  HEP as Tolerated. 04/12/2023 2. Chronic low back pain with documented facet arthropathy and history of intradural defect/schwanomma requiring resection, per Dr. Babs Note.  Continue current medication regimen. Refilled: Percocet 10/325 one q6 prn #120. 04/12/2023  We will continue the opioid monitoring program, this consists of regular clinic visits, examinations, urine drug screen, pill counts as well as use of [place]  Controlled Substance Reporting system. A 12 month History has been reviewed on the [place]  Controlled Substance Reporting System on 04/12/2023   F/U in 1 month

## 2023-04-30 DIAGNOSIS — I472 Ventricular tachycardia, unspecified: Secondary | ICD-10-CM | POA: Diagnosis not present

## 2023-04-30 DIAGNOSIS — I2510 Atherosclerotic heart disease of native coronary artery without angina pectoris: Secondary | ICD-10-CM | POA: Diagnosis not present

## 2023-04-30 DIAGNOSIS — N189 Chronic kidney disease, unspecified: Secondary | ICD-10-CM | POA: Diagnosis not present

## 2023-04-30 DIAGNOSIS — I5022 Chronic systolic (congestive) heart failure: Secondary | ICD-10-CM | POA: Diagnosis not present

## 2023-04-30 DIAGNOSIS — I130 Hypertensive heart and chronic kidney disease with heart failure and stage 1 through stage 4 chronic kidney disease, or unspecified chronic kidney disease: Secondary | ICD-10-CM | POA: Diagnosis not present

## 2023-04-30 DIAGNOSIS — Z9889 Other specified postprocedural states: Secondary | ICD-10-CM | POA: Diagnosis not present

## 2023-04-30 DIAGNOSIS — Z01810 Encounter for preprocedural cardiovascular examination: Secondary | ICD-10-CM | POA: Diagnosis not present

## 2023-05-01 DIAGNOSIS — I10 Essential (primary) hypertension: Secondary | ICD-10-CM | POA: Diagnosis not present

## 2023-05-01 DIAGNOSIS — R7303 Prediabetes: Secondary | ICD-10-CM | POA: Diagnosis not present

## 2023-05-01 DIAGNOSIS — R351 Nocturia: Secondary | ICD-10-CM | POA: Diagnosis not present

## 2023-05-01 DIAGNOSIS — Z0000 Encounter for general adult medical examination without abnormal findings: Secondary | ICD-10-CM | POA: Diagnosis not present

## 2023-05-01 DIAGNOSIS — K219 Gastro-esophageal reflux disease without esophagitis: Secondary | ICD-10-CM | POA: Diagnosis not present

## 2023-05-01 DIAGNOSIS — R5383 Other fatigue: Secondary | ICD-10-CM | POA: Diagnosis not present

## 2023-05-01 DIAGNOSIS — I639 Cerebral infarction, unspecified: Secondary | ICD-10-CM | POA: Diagnosis not present

## 2023-05-01 DIAGNOSIS — R0602 Shortness of breath: Secondary | ICD-10-CM | POA: Diagnosis not present

## 2023-05-01 DIAGNOSIS — N401 Enlarged prostate with lower urinary tract symptoms: Secondary | ICD-10-CM | POA: Diagnosis not present

## 2023-05-01 DIAGNOSIS — N1831 Chronic kidney disease, stage 3a: Secondary | ICD-10-CM | POA: Diagnosis not present

## 2023-05-01 DIAGNOSIS — E789 Disorder of lipoprotein metabolism, unspecified: Secondary | ICD-10-CM | POA: Diagnosis not present

## 2023-05-01 DIAGNOSIS — E6609 Other obesity due to excess calories: Secondary | ICD-10-CM | POA: Diagnosis not present

## 2023-05-08 NOTE — Progress Notes (Addendum)
Subjective:    Patient ID: George Villa, male    DOB: [birth year], 58 y.o.   MRN: [id number]  HPI: [name] is a 58 y.o. male who returns for follow up appointment for chronic pain and medication refill. He states his pain is located in his lower back. He rates his pain 8. His current exercise regime is walking and performing stretching exercises.  Mr. [name] states he had some flu symptoms since last week, he was instructed to F/U with his PCP. He verbalizes understanding.   Mr. [name] Morphine equivalent is 60.00 MME.   Oral Swab was Performed today.      Pain Inventory Average Pain 8 Pain Right Now 8 My pain is intermittent and dull  In the last 24 hours, has pain interfered with the following? General activity 8 Relation with others 0 Enjoyment of life 8 What TIME of day is your pain at its worst? evening Sleep (in general) Fair  Pain is worse with: walking, bending, standing, and some activites Pain improves with: rest and medication Relief from Meds: 8  Family History  Problem Relation Age of Onset   Heart Problems Father    Hypertension Mother    Stroke Other    Social History   Socioeconomic History   Marital status: Single    Spouse name: Not on file   Number of children: Not on file   Years of education: Not on file   Highest education level: Not on file  Occupational History   Occupation: umemployed     Comment: Works in [employer]   Tobacco Use   Smoking status: Never   Smokeless tobacco: Never  Vaping Use   Vaping status: Never Used  Substance and Sexual Activity   Alcohol use: Yes    Alcohol/week: 6.0 standard drinks of alcohol    Types: 6 Cans of beer per week    Comment: OCCASSIONALLY    Drug use: No   Sexual activity: Yes  Other Topics Concern   Not on file  Social History Narrative   7 cups of caffeine a week    Social Drivers of Corporate investment banker Strain: Not on file  Food Insecurity: Not on file   Transportation Needs: Not on file  Physical Activity: Not on file  Stress: Not on file  Social Connections: Not on file   Past Surgical History:  Procedure Laterality Date   BACK SURGERY     BRAIN HEMATOMA EVACUATION  2006   LEFT HEART CATHETERIZATION WITH CORONARY ANGIOGRAM N/A 02/17/2014   Procedure: LEFT HEART CATHETERIZATION WITH CORONARY ANGIOGRAM;  Surgeon: [name], MD;  Location: [facility] CATH LAB;  Service: Cardiovascular;  Laterality: N/A;   LUMBAR DISC SURGERY  2013   PERCUTANEOUS CORONARY STENT INTERVENTION (PCI-S)  02/17/2014   Procedure: PERCUTANEOUS CORONARY STENT INTERVENTION (PCI-S);  Surgeon: [name], MD;  Location: [facility] CATH LAB;  Service: Cardiovascular;;   TRACHEOSTOMY  05/2004   VENTRICULOSTOMY  2006   Past Surgical History:  Procedure Laterality Date   BACK SURGERY     BRAIN HEMATOMA EVACUATION  2006   LEFT HEART CATHETERIZATION WITH CORONARY ANGIOGRAM N/A 02/17/2014   Procedure: LEFT HEART CATHETERIZATION WITH CORONARY ANGIOGRAM;  Surgeon: [name], MD;  Location: [facility] CATH LAB;  Service: Cardiovascular;  Laterality: N/A;   LUMBAR DISC SURGERY  2013   PERCUTANEOUS CORONARY STENT INTERVENTION (PCI-S)  02/17/2014   Procedure: PERCUTANEOUS CORONARY STENT INTERVENTION (PCI-S);  Surgeon: Sherilyn Cooter  Jamey Reas, MD;  Location: [facility] CATH LAB;  Service: Cardiovascular;;   TRACHEOSTOMY  05/2004   VENTRICULOSTOMY  2006   Past Medical History:  Diagnosis Date   Acid reflux    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  Opioid Risk Score:   Fall Risk Score:  `1  Depression screen [facility] 2/9     04/12/2023    3:03 PM 02/15/2023   11:15 AM 08/24/2022   10:47  AM 07/27/2022   10:59 AM 06/06/2022   11:00 AM 05/04/2022   11:27 AM 04/03/2022    1:01 PM  Depression screen PHQ 2/9  Decreased Interest 0 0 0 0 0 0 0  Down, Depressed, Hopeless 0 0 0 0 0 0 0  PHQ - 2 Score 0 0 0 0 0 0 0    Review of Systems  Musculoskeletal:  Positive for back pain and gait problem.  All other systems reviewed and are negative.      Objective:   Physical Exam Vitals and nursing note reviewed.  Constitutional:      Appearance: Normal appearance.  Cardiovascular:     Rate and Rhythm: Normal rate and regular rhythm.     Pulses: Normal pulses.     Heart sounds: Normal heart sounds.  Pulmonary:     Effort: Pulmonary effort is normal.     Breath sounds: Normal breath sounds.  Musculoskeletal:     Comments: Normal Muscle Bulk and Muscle Testing Reveals:  Upper Extremities: Full ROM and Muscle Strength 5/5 Lumbar Paraspinal Tenderness: L-4-L-5 Lower Extremities: Full ROM and Muscle Strength 5/5 Arises from Table slowly using cane for support Antalgic  Gait     Skin:    General: Skin is warm and dry.  Neurological:     Mental Status: He is alert and oriented to person, place, and time.  Psychiatric:        Mood and Affect: Mood normal.        Behavior: Behavior normal.         Assessment & Plan:  1.History of multiple strokes with gait disorder, ongoing ataxia and spastic left hemiparesis: Continue  HEP as Tolerated. 05/13/2023 2. Chronic low back pain with documented facet arthropathy and history of intradural defect/schwanomma requiring resection, per Dr. Riley Kill Note.  Continue current medication regimen. Refilled: Percocet 10/325 one q6 prn #120. 05/13/2023  We will continue the opioid monitoring program, this consists of regular clinic visits, examinations, urine drug screen, pill counts as well as use of [place] Controlled Substance Reporting system. A 12 month History has been reviewed on the [place] Controlled Substance Reporting System  on 05/13/2023   F/U in 1 month

## 2023-05-10 DIAGNOSIS — G4733 Obstructive sleep apnea (adult) (pediatric): Secondary | ICD-10-CM | POA: Diagnosis not present

## 2023-05-13 ENCOUNTER — Encounter: Payer: Medicare Other | Attending: Physical Medicine & Rehabilitation | Admitting: Registered Nurse

## 2023-05-13 ENCOUNTER — Encounter: Payer: Self-pay | Admitting: Registered Nurse

## 2023-05-13 VITALS — BP 120/84 | HR 91 | Ht 67.0 in | Wt 198.0 lb

## 2023-05-13 DIAGNOSIS — Z79891 Long term (current) use of opiate analgesic: Secondary | ICD-10-CM | POA: Insufficient documentation

## 2023-05-13 DIAGNOSIS — R269 Unspecified abnormalities of gait and mobility: Secondary | ICD-10-CM | POA: Insufficient documentation

## 2023-05-13 DIAGNOSIS — Z5181 Encounter for therapeutic drug level monitoring: Secondary | ICD-10-CM | POA: Diagnosis not present

## 2023-05-13 DIAGNOSIS — G894 Chronic pain syndrome: Secondary | ICD-10-CM | POA: Diagnosis not present

## 2023-05-13 DIAGNOSIS — M47816 Spondylosis without myelopathy or radiculopathy, lumbar region: Secondary | ICD-10-CM | POA: Diagnosis not present

## 2023-05-13 MED ORDER — OXYCODONE-ACETAMINOPHEN 10-325 MG PO TABS
1.0000 | ORAL_TABLET | Freq: Four times a day (QID) | ORAL | 0 refills | Status: DC | PRN
Start: 2023-05-13 — End: 2023-06-07

## 2023-05-15 DIAGNOSIS — E559 Vitamin D deficiency, unspecified: Secondary | ICD-10-CM | POA: Diagnosis not present

## 2023-05-15 DIAGNOSIS — E6609 Other obesity due to excess calories: Secondary | ICD-10-CM | POA: Diagnosis not present

## 2023-05-15 DIAGNOSIS — E789 Disorder of lipoprotein metabolism, unspecified: Secondary | ICD-10-CM | POA: Diagnosis not present

## 2023-05-15 DIAGNOSIS — I639 Cerebral infarction, unspecified: Secondary | ICD-10-CM | POA: Diagnosis not present

## 2023-05-15 DIAGNOSIS — Z6831 Body mass index (BMI) 31.0-31.9, adult: Secondary | ICD-10-CM | POA: Diagnosis not present

## 2023-05-15 DIAGNOSIS — K219 Gastro-esophageal reflux disease without esophagitis: Secondary | ICD-10-CM | POA: Diagnosis not present

## 2023-05-15 DIAGNOSIS — N1831 Chronic kidney disease, stage 3a: Secondary | ICD-10-CM | POA: Diagnosis not present

## 2023-05-15 DIAGNOSIS — R7303 Prediabetes: Secondary | ICD-10-CM | POA: Diagnosis not present

## 2023-05-15 DIAGNOSIS — N401 Enlarged prostate with lower urinary tract symptoms: Secondary | ICD-10-CM | POA: Diagnosis not present

## 2023-05-15 DIAGNOSIS — R030 Elevated blood-pressure reading, without diagnosis of hypertension: Secondary | ICD-10-CM | POA: Diagnosis not present

## 2023-05-15 DIAGNOSIS — I10 Essential (primary) hypertension: Secondary | ICD-10-CM | POA: Diagnosis not present

## 2023-05-15 DIAGNOSIS — N529 Male erectile dysfunction, unspecified: Secondary | ICD-10-CM | POA: Diagnosis not present

## 2023-05-18 DIAGNOSIS — Z888 Allergy status to other drugs, medicaments and biological substances status: Secondary | ICD-10-CM | POA: Diagnosis not present

## 2023-05-18 DIAGNOSIS — I517 Cardiomegaly: Secondary | ICD-10-CM | POA: Diagnosis not present

## 2023-05-18 DIAGNOSIS — N189 Chronic kidney disease, unspecified: Secondary | ICD-10-CM | POA: Diagnosis not present

## 2023-05-18 DIAGNOSIS — R110 Nausea: Secondary | ICD-10-CM | POA: Diagnosis not present

## 2023-05-18 DIAGNOSIS — F330 Major depressive disorder, recurrent, mild: Secondary | ICD-10-CM | POA: Diagnosis not present

## 2023-05-18 DIAGNOSIS — Z4502 Encounter for adjustment and management of automatic implantable cardiac defibrillator: Secondary | ICD-10-CM | POA: Diagnosis not present

## 2023-05-18 DIAGNOSIS — I5084 End stage heart failure: Secondary | ICD-10-CM | POA: Diagnosis not present

## 2023-05-18 DIAGNOSIS — T827XXA Infection and inflammatory reaction due to other cardiac and vascular devices, implants and grafts, initial encounter: Secondary | ICD-10-CM | POA: Diagnosis not present

## 2023-05-18 DIAGNOSIS — T82897A Other specified complication of cardiac prosthetic devices, implants and grafts, initial encounter: Secondary | ICD-10-CM | POA: Diagnosis not present

## 2023-05-18 DIAGNOSIS — N179 Acute kidney failure, unspecified: Secondary | ICD-10-CM | POA: Diagnosis not present

## 2023-05-18 DIAGNOSIS — Z6841 Body Mass Index (BMI) 40.0 and over, adult: Secondary | ICD-10-CM | POA: Diagnosis not present

## 2023-05-18 DIAGNOSIS — R7881 Bacteremia: Secondary | ICD-10-CM | POA: Diagnosis not present

## 2023-05-18 DIAGNOSIS — I130 Hypertensive heart and chronic kidney disease with heart failure and stage 1 through stage 4 chronic kidney disease, or unspecified chronic kidney disease: Secondary | ICD-10-CM | POA: Diagnosis not present

## 2023-05-18 DIAGNOSIS — G4733 Obstructive sleep apnea (adult) (pediatric): Secondary | ICD-10-CM | POA: Diagnosis not present

## 2023-05-18 DIAGNOSIS — B9561 Methicillin susceptible Staphylococcus aureus infection as the cause of diseases classified elsewhere: Secondary | ICD-10-CM | POA: Diagnosis not present

## 2023-05-18 DIAGNOSIS — R112 Nausea with vomiting, unspecified: Secondary | ICD-10-CM | POA: Diagnosis not present

## 2023-05-18 DIAGNOSIS — B952 Enterococcus as the cause of diseases classified elsewhere: Secondary | ICD-10-CM | POA: Diagnosis not present

## 2023-05-18 DIAGNOSIS — I472 Ventricular tachycardia, unspecified: Secondary | ICD-10-CM | POA: Diagnosis not present

## 2023-05-18 DIAGNOSIS — I420 Dilated cardiomyopathy: Secondary | ICD-10-CM | POA: Diagnosis not present

## 2023-05-18 DIAGNOSIS — R8271 Bacteriuria: Secondary | ICD-10-CM | POA: Diagnosis not present

## 2023-05-18 DIAGNOSIS — Z95811 Presence of heart assist device: Secondary | ICD-10-CM | POA: Diagnosis not present

## 2023-05-18 DIAGNOSIS — R0689 Other abnormalities of breathing: Secondary | ICD-10-CM | POA: Diagnosis not present

## 2023-05-18 DIAGNOSIS — I2510 Atherosclerotic heart disease of native coronary artery without angina pectoris: Secondary | ICD-10-CM | POA: Diagnosis not present

## 2023-05-18 DIAGNOSIS — R0602 Shortness of breath: Secondary | ICD-10-CM | POA: Diagnosis not present

## 2023-05-18 DIAGNOSIS — Z880 Allergy status to penicillin: Secondary | ICD-10-CM | POA: Diagnosis not present

## 2023-05-18 DIAGNOSIS — R509 Fever, unspecified: Secondary | ICD-10-CM | POA: Diagnosis not present

## 2023-05-18 DIAGNOSIS — E669 Obesity, unspecified: Secondary | ICD-10-CM | POA: Diagnosis not present

## 2023-05-18 DIAGNOSIS — I5022 Chronic systolic (congestive) heart failure: Secondary | ICD-10-CM | POA: Diagnosis not present

## 2023-05-18 DIAGNOSIS — I6523 Occlusion and stenosis of bilateral carotid arteries: Secondary | ICD-10-CM | POA: Diagnosis not present

## 2023-05-18 DIAGNOSIS — D631 Anemia in chronic kidney disease: Secondary | ICD-10-CM | POA: Diagnosis not present

## 2023-05-18 DIAGNOSIS — R000 Tachycardia, unspecified: Secondary | ICD-10-CM | POA: Diagnosis not present

## 2023-05-18 DIAGNOSIS — R519 Headache, unspecified: Secondary | ICD-10-CM | POA: Diagnosis not present

## 2023-05-18 DIAGNOSIS — M542 Cervicalgia: Secondary | ICD-10-CM | POA: Diagnosis not present

## 2023-05-18 DIAGNOSIS — E785 Hyperlipidemia, unspecified: Secondary | ICD-10-CM | POA: Diagnosis not present

## 2023-05-18 DIAGNOSIS — E1122 Type 2 diabetes mellitus with diabetic chronic kidney disease: Secondary | ICD-10-CM | POA: Diagnosis not present

## 2023-05-18 LAB — DRUG TOX MONITOR 1 W/CONF, ORAL FLD
Amphetamines: NEGATIVE ng/mL (ref <10)
Barbiturates: NEGATIVE ng/mL (ref <10)
Benzodiazepines: NEGATIVE ng/mL (ref <0.50)
Buprenorphine: NEGATIVE ng/mL (ref <0.10)
Cocaine: NEGATIVE ng/mL (ref <5.0)
Codeine: NEGATIVE ng/mL (ref <2.5)
Dihydrocodeine: NEGATIVE ng/mL (ref <2.5)
Fentanyl: NEGATIVE ng/mL (ref <0.10)
Heroin Metabolite: NEGATIVE ng/mL (ref <1.0)
Hydrocodone: NEGATIVE ng/mL (ref <2.5)
Hydromorphone: NEGATIVE ng/mL (ref <2.5)
MARIJUANA: NEGATIVE ng/mL (ref <2.5)
MDMA: NEGATIVE ng/mL (ref <10)
Meprobamate: NEGATIVE ng/mL (ref <2.5)
Methadone: NEGATIVE ng/mL (ref <5.0)
Morphine: NEGATIVE ng/mL (ref <2.5)
Nicotine Metabolite: NEGATIVE ng/mL (ref <5.0)
Norhydrocodone: NEGATIVE ng/mL (ref <2.5)
Noroxycodone: 11.7 ng/mL — ABNORMAL HIGH (ref <2.5)
Opiates: POSITIVE ng/mL — AB (ref <2.5)
Oxycodone: 23.2 ng/mL — ABNORMAL HIGH (ref <2.5)
Oxymorphone: NEGATIVE ng/mL (ref <2.5)
Phencyclidine: NEGATIVE ng/mL (ref <10)
Tapentadol: NEGATIVE ng/mL (ref <5.0)
Tramadol: NEGATIVE ng/mL (ref <5.0)
Zolpidem: NEGATIVE ng/mL (ref <5.0)

## 2023-05-18 LAB — DRUG TOX ALC METAB W/CON, ORAL FLD: Alcohol Metabolite: NEGATIVE ng/mL (ref <25)

## 2023-05-19 DIAGNOSIS — B9561 Methicillin susceptible Staphylococcus aureus infection as the cause of diseases classified elsewhere: Secondary | ICD-10-CM | POA: Diagnosis not present

## 2023-05-19 DIAGNOSIS — R0602 Shortness of breath: Secondary | ICD-10-CM | POA: Diagnosis not present

## 2023-05-19 DIAGNOSIS — T827XXA Infection and inflammatory reaction due to other cardiac and vascular devices, implants and grafts, initial encounter: Secondary | ICD-10-CM | POA: Diagnosis not present

## 2023-05-19 DIAGNOSIS — R7881 Bacteremia: Secondary | ICD-10-CM | POA: Diagnosis not present

## 2023-05-21 DIAGNOSIS — B9561 Methicillin susceptible Staphylococcus aureus infection as the cause of diseases classified elsewhere: Secondary | ICD-10-CM | POA: Diagnosis not present

## 2023-05-21 DIAGNOSIS — R7881 Bacteremia: Secondary | ICD-10-CM | POA: Diagnosis not present

## 2023-05-21 DIAGNOSIS — T827XXD Infection and inflammatory reaction due to other cardiac and vascular devices, implants and grafts, subsequent encounter: Secondary | ICD-10-CM | POA: Diagnosis not present

## 2023-05-21 DIAGNOSIS — N3289 Other specified disorders of bladder: Secondary | ICD-10-CM | POA: Diagnosis not present

## 2023-05-23 DIAGNOSIS — R7881 Bacteremia: Secondary | ICD-10-CM | POA: Diagnosis not present

## 2023-05-23 DIAGNOSIS — I472 Ventricular tachycardia, unspecified: Secondary | ICD-10-CM | POA: Diagnosis not present

## 2023-05-23 DIAGNOSIS — I420 Dilated cardiomyopathy: Secondary | ICD-10-CM | POA: Diagnosis not present

## 2023-05-23 DIAGNOSIS — I5020 Unspecified systolic (congestive) heart failure: Secondary | ICD-10-CM | POA: Diagnosis not present

## 2023-05-24 DIAGNOSIS — Z452 Encounter for adjustment and management of vascular access device: Secondary | ICD-10-CM | POA: Diagnosis not present

## 2023-05-24 DIAGNOSIS — B9562 Methicillin resistant Staphylococcus aureus infection as the cause of diseases classified elsewhere: Secondary | ICD-10-CM | POA: Diagnosis not present

## 2023-05-24 DIAGNOSIS — T827XXA Infection and inflammatory reaction due to other cardiac and vascular devices, implants and grafts, initial encounter: Secondary | ICD-10-CM | POA: Diagnosis not present

## 2023-05-24 DIAGNOSIS — I517 Cardiomegaly: Secondary | ICD-10-CM | POA: Diagnosis not present

## 2023-05-24 DIAGNOSIS — B9561 Methicillin susceptible Staphylococcus aureus infection as the cause of diseases classified elsewhere: Secondary | ICD-10-CM | POA: Diagnosis not present

## 2023-05-24 DIAGNOSIS — T82897A Other specified complication of cardiac prosthetic devices, implants and grafts, initial encounter: Secondary | ICD-10-CM | POA: Diagnosis not present

## 2023-05-24 DIAGNOSIS — Z950 Presence of cardiac pacemaker: Secondary | ICD-10-CM | POA: Diagnosis not present

## 2023-05-24 DIAGNOSIS — R7881 Bacteremia: Secondary | ICD-10-CM | POA: Diagnosis not present

## 2023-05-25 DIAGNOSIS — T827XXA Infection and inflammatory reaction due to other cardiac and vascular devices, implants and grafts, initial encounter: Secondary | ICD-10-CM | POA: Diagnosis not present

## 2023-05-26 DIAGNOSIS — T827XXA Infection and inflammatory reaction due to other cardiac and vascular devices, implants and grafts, initial encounter: Secondary | ICD-10-CM | POA: Diagnosis not present

## 2023-05-27 DIAGNOSIS — T827XXA Infection and inflammatory reaction due to other cardiac and vascular devices, implants and grafts, initial encounter: Secondary | ICD-10-CM | POA: Diagnosis not present

## 2023-05-28 DIAGNOSIS — T827XXA Infection and inflammatory reaction due to other cardiac and vascular devices, implants and grafts, initial encounter: Secondary | ICD-10-CM | POA: Diagnosis not present

## 2023-05-29 DIAGNOSIS — T827XXA Infection and inflammatory reaction due to other cardiac and vascular devices, implants and grafts, initial encounter: Secondary | ICD-10-CM | POA: Diagnosis not present

## 2023-05-30 DIAGNOSIS — Z95810 Presence of automatic (implantable) cardiac defibrillator: Secondary | ICD-10-CM | POA: Diagnosis not present

## 2023-05-30 DIAGNOSIS — T827XXA Infection and inflammatory reaction due to other cardiac and vascular devices, implants and grafts, initial encounter: Secondary | ICD-10-CM | POA: Diagnosis not present

## 2023-06-03 DIAGNOSIS — Z95811 Presence of heart assist device: Secondary | ICD-10-CM | POA: Diagnosis not present

## 2023-06-03 DIAGNOSIS — R42 Dizziness and giddiness: Secondary | ICD-10-CM | POA: Diagnosis not present

## 2023-06-03 DIAGNOSIS — I2510 Atherosclerotic heart disease of native coronary artery without angina pectoris: Secondary | ICD-10-CM | POA: Diagnosis not present

## 2023-06-03 DIAGNOSIS — R112 Nausea with vomiting, unspecified: Secondary | ICD-10-CM | POA: Diagnosis not present

## 2023-06-03 DIAGNOSIS — R110 Nausea: Secondary | ICD-10-CM | POA: Diagnosis not present

## 2023-06-03 DIAGNOSIS — I420 Dilated cardiomyopathy: Secondary | ICD-10-CM | POA: Diagnosis not present

## 2023-06-03 DIAGNOSIS — R195 Other fecal abnormalities: Secondary | ICD-10-CM | POA: Diagnosis not present

## 2023-06-03 DIAGNOSIS — R7881 Bacteremia: Secondary | ICD-10-CM | POA: Diagnosis not present

## 2023-06-03 DIAGNOSIS — R10811 Right upper quadrant abdominal tenderness: Secondary | ICD-10-CM | POA: Diagnosis not present

## 2023-06-03 DIAGNOSIS — Z95810 Presence of automatic (implantable) cardiac defibrillator: Secondary | ICD-10-CM | POA: Diagnosis not present

## 2023-06-03 DIAGNOSIS — R1013 Epigastric pain: Secondary | ICD-10-CM | POA: Diagnosis not present

## 2023-06-03 DIAGNOSIS — K6389 Other specified diseases of intestine: Secondary | ICD-10-CM | POA: Diagnosis not present

## 2023-06-03 DIAGNOSIS — I5020 Unspecified systolic (congestive) heart failure: Secondary | ICD-10-CM | POA: Diagnosis not present

## 2023-06-03 DIAGNOSIS — T827XXA Infection and inflammatory reaction due to other cardiac and vascular devices, implants and grafts, initial encounter: Secondary | ICD-10-CM | POA: Diagnosis not present

## 2023-06-03 DIAGNOSIS — I517 Cardiomegaly: Secondary | ICD-10-CM | POA: Diagnosis not present

## 2023-06-03 DIAGNOSIS — R0602 Shortness of breath: Secondary | ICD-10-CM | POA: Diagnosis not present

## 2023-06-03 DIAGNOSIS — B9561 Methicillin susceptible Staphylococcus aureus infection as the cause of diseases classified elsewhere: Secondary | ICD-10-CM | POA: Diagnosis not present

## 2023-06-03 DIAGNOSIS — I5022 Chronic systolic (congestive) heart failure: Secondary | ICD-10-CM | POA: Diagnosis not present

## 2023-06-04 DIAGNOSIS — Z95811 Presence of heart assist device: Secondary | ICD-10-CM | POA: Diagnosis not present

## 2023-06-04 DIAGNOSIS — I5022 Chronic systolic (congestive) heart failure: Secondary | ICD-10-CM | POA: Diagnosis not present

## 2023-06-04 DIAGNOSIS — R7881 Bacteremia: Secondary | ICD-10-CM | POA: Diagnosis not present

## 2023-06-04 DIAGNOSIS — B9561 Methicillin susceptible Staphylococcus aureus infection as the cause of diseases classified elsewhere: Secondary | ICD-10-CM | POA: Diagnosis not present

## 2023-06-07 ENCOUNTER — Encounter: Payer: Medicare Other | Admitting: Registered Nurse

## 2023-06-07 ENCOUNTER — Telehealth: Payer: Self-pay | Admitting: Registered Nurse

## 2023-06-07 ENCOUNTER — Encounter: Payer: Self-pay | Admitting: Registered Nurse

## 2023-06-07 VITALS — BP 131/90 | HR 97 | Ht 67.0 in | Wt 195.6 lb

## 2023-06-07 DIAGNOSIS — R269 Unspecified abnormalities of gait and mobility: Secondary | ICD-10-CM

## 2023-06-07 DIAGNOSIS — M47816 Spondylosis without myelopathy or radiculopathy, lumbar region: Secondary | ICD-10-CM | POA: Diagnosis not present

## 2023-06-07 DIAGNOSIS — Z5181 Encounter for therapeutic drug level monitoring: Secondary | ICD-10-CM

## 2023-06-07 DIAGNOSIS — Z79891 Long term (current) use of opiate analgesic: Secondary | ICD-10-CM | POA: Diagnosis not present

## 2023-06-07 DIAGNOSIS — G894 Chronic pain syndrome: Secondary | ICD-10-CM | POA: Diagnosis not present

## 2023-06-07 MED ORDER — OXYCODONE-ACETAMINOPHEN 10-325 MG PO TABS: 1.0000 | ORAL_TABLET | Freq: Four times a day (QID) | ORAL | 0 refills | Status: DC | PRN

## 2023-06-07 NOTE — Telephone Encounter (Signed)
 Pt called to let you know to he found a pharmacy that has his medications. Please sent to [facility] [address].

## 2023-06-07 NOTE — Telephone Encounter (Signed)
 PMP was Reviewed.  [facility] out of stock of Oxycodone . Oxycodone e-scribed to [facility].  Call placed to George Villa regarding the above, he verbalizes understanding.

## 2023-06-07 NOTE — Progress Notes (Signed)
 Subjective:    Patient ID: George Villa, male    DOB: Mar 08, 1966, 58 y.o.   MRN: [id number]  HPI: Keithen Capo is a 58 y.o. male who returns for follow up appointment for chronic pain and medication refill. He states his pain is located in his lower back. He rates his pain 8. His current exercise regime is walking,riding his stationary bicycle 30 minutes daily and performing stretching exercises.  Mr. Tobon Morphine equivalent is 60.00 MME.   Last Oral Swab was Performed on 05/13/2023, it was consistent.     Pain Inventory Average Pain 8 Pain Right Now 8 My pain is dull  In the last 24 hours, has pain interfered with the following? General activity 7 Relation with others 7 Enjoyment of life 7 What TIME of day is your pain at its worst? night Sleep (in general) Fair  Pain is worse with: walking and bending Pain improves with: rest and medication Relief from Meds: 8  Family History  Problem Relation Age of Onset   Heart Problems Father    Hypertension Mother    Stroke Other    Social History   Socioeconomic History   Marital status: Single    Spouse name: Not on file   Number of children: Not on file   Years of education: Not on file   Highest education level: Not on file  Occupational History   Occupation: umemployed     Comment: Works in [employer]   Tobacco Use   Smoking status: Never   Smokeless tobacco: Never  Vaping Use   Vaping status: Never Used  Substance and Sexual Activity   Alcohol use: Yes    Alcohol/week: 6.0 standard drinks of alcohol    Types: 6 Cans of beer per week    Comment: OCCASSIONALLY    Drug use: No   Sexual activity: Yes  Other Topics Concern   Not on file  Social History Narrative   7 cups of caffeine a week    Social Drivers of Corporate investment banker Strain: Not on file  Food Insecurity: Not on file  Transportation Needs: Not on file  Physical Activity: Not on file  Stress: Not on file  Social Connections:  Not on file   Past Surgical History:  Procedure Laterality Date   BACK SURGERY     BRAIN HEMATOMA EVACUATION  2006   LEFT HEART CATHETERIZATION WITH CORONARY ANGIOGRAM N/A 02/17/2014   Procedure: LEFT HEART CATHETERIZATION WITH CORONARY ANGIOGRAM;  Surgeon: [name], MD;  Location: [facility] CATH LAB;  Service: Cardiovascular;  Laterality: N/A;   LUMBAR DISC SURGERY  2013   PERCUTANEOUS CORONARY STENT INTERVENTION (PCI-S)  02/17/2014   Procedure: PERCUTANEOUS CORONARY STENT INTERVENTION (PCI-S);  Surgeon: [name], MD;  Location: [facility] CATH LAB;  Service: Cardiovascular;;   TRACHEOSTOMY  05/2004   VENTRICULOSTOMY  2006   Past Surgical History:  Procedure Laterality Date   BACK SURGERY     BRAIN HEMATOMA EVACUATION  2006   LEFT HEART CATHETERIZATION WITH CORONARY ANGIOGRAM N/A 02/17/2014   Procedure: LEFT HEART CATHETERIZATION WITH CORONARY ANGIOGRAM;  Surgeon: [name], MD;  Location: [facility] CATH LAB;  Service: Cardiovascular;  Laterality: N/A;   LUMBAR DISC SURGERY  2013   PERCUTANEOUS CORONARY STENT INTERVENTION (PCI-S)  02/17/2014   Procedure: PERCUTANEOUS CORONARY STENT INTERVENTION (PCI-S);  Surgeon: [name], MD;  Location: [facility] CATH LAB;  Service: Cardiovascular;;   TRACHEOSTOMY  05/2004  VENTRICULOSTOMY  2006   Past Medical History:  Diagnosis Date   Acid reflux    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   [name] 02/17/2014   Stroke (HCC) 2006   "left leg weak since" (02/18/2014)   BP (!) 131/90   Pulse 97   Ht 5\' 7"  (1.702 m)   Wt 195 lb 9.6 oz (88.7 kg)   SpO2 97%   BMI 30.64 kg/m   Opioid Risk Score:   Fall Risk Score:  `1  Depression screen PHQ 2/9     06/07/2023    1:40 PM 05/13/2023    9:52 AM 04/12/2023    3:03 PM 02/15/2023   11:15 AM 08/24/2022   10:47 AM  07/27/2022   10:59 AM 06/06/2022   11:00 AM  Depression screen PHQ 2/9  Decreased Interest 0 1 0 0 0 0 0  Down, Depressed, Hopeless 0 1 0 0 0 0 0  PHQ - 2 Score 0 2 0 0 0 0 0     Review of Systems  All other systems reviewed and are negative.      Objective:   Physical Exam Vitals and nursing note reviewed.  Constitutional:      Appearance: Normal appearance.  Cardiovascular:     Rate and Rhythm: Normal rate and regular rhythm.     Pulses: Normal pulses.     Heart sounds: Normal heart sounds.  Pulmonary:     Effort: Pulmonary effort is normal.     Breath sounds: Normal breath sounds.  Musculoskeletal:     Comments: Normal Muscle Bulk and Muscle Testing Reveals:  Upper Extremities: Full ROM and Muscle Strength 5/5  Lumbar Paraspinal Tenderness: L-4-L-5 Lower Extremities: Full ROM and Muscle Strength 5/5 Arises from Chair slowly using cane for support Antaglic  Gait     Skin:    General: Skin is warm and dry.  Neurological:     Mental Status: He is alert and oriented to person, place, and time.  Psychiatric:        Mood and Affect: Mood normal.        Behavior: Behavior normal.           Assessment & Plan:  1.History of multiple strokes with gait disorder, ongoing ataxia and spastic left hemiparesis: Continue  HEP as Tolerated. 06/07/2023 2. Chronic low back pain with documented facet arthropathy and history of intradural defect/schwanomma requiring resection, per Dr. Riley Kill Note.  Continue current medication regimen. Refilled: Percocet 10/325 one q6 prn #120. 06/07/2023  We will continue the opioid monitoring program, this consists of regular clinic visits, examinations, urine drug screen, pill counts as well as use of [place] Controlled Substance Reporting system. A 12 month History has been reviewed on the [place] Controlled Substance Reporting System on 06/07/2023   F/U in 1 month

## 2023-06-25 DIAGNOSIS — Z7901 Long term (current) use of anticoagulants: Secondary | ICD-10-CM | POA: Diagnosis not present

## 2023-06-25 DIAGNOSIS — Z6841 Body Mass Index (BMI) 40.0 and over, adult: Secondary | ICD-10-CM | POA: Diagnosis not present

## 2023-06-25 DIAGNOSIS — B9561 Methicillin susceptible Staphylococcus aureus infection as the cause of diseases classified elsewhere: Secondary | ICD-10-CM | POA: Diagnosis not present

## 2023-06-25 DIAGNOSIS — R7881 Bacteremia: Secondary | ICD-10-CM | POA: Diagnosis not present

## 2023-06-25 DIAGNOSIS — E66813 Obesity, class 3: Secondary | ICD-10-CM | POA: Diagnosis not present

## 2023-06-25 DIAGNOSIS — Z95811 Presence of heart assist device: Secondary | ICD-10-CM | POA: Diagnosis not present

## 2023-06-25 DIAGNOSIS — I5042 Chronic combined systolic (congestive) and diastolic (congestive) heart failure: Secondary | ICD-10-CM | POA: Diagnosis not present

## 2023-06-25 DIAGNOSIS — I10 Essential (primary) hypertension: Secondary | ICD-10-CM | POA: Diagnosis not present

## 2023-06-25 DIAGNOSIS — I5020 Unspecified systolic (congestive) heart failure: Secondary | ICD-10-CM | POA: Diagnosis not present

## 2023-06-25 DIAGNOSIS — N1832 Chronic kidney disease, stage 3b: Secondary | ICD-10-CM | POA: Diagnosis not present

## 2023-07-02 ENCOUNTER — Telehealth: Payer: Self-pay | Admitting: Registered Nurse

## 2023-07-02 DIAGNOSIS — M47816 Spondylosis without myelopathy or radiculopathy, lumbar region: Secondary | ICD-10-CM

## 2023-07-02 MED ORDER — OXYCODONE-ACETAMINOPHEN 10-325 MG PO TABS: 1.0000 | ORAL_TABLET | Freq: Four times a day (QID) | ORAL | 0 refills | Status: DC | PRN

## 2023-07-02 NOTE — Telephone Encounter (Signed)
 PMP was Reviewed.  Last Oxycodone was filled on 06/08/2023.  [name] has a scheduled appointment this week, sent message regarding PA.  Prescription sent, call placed to [name] to call pharmacy on 07/05/2023, regarding prescription. No answer.

## 2023-07-02 NOTE — Telephone Encounter (Signed)
 Patient calling George Villa to remind her that his oxycodone needs prior auth.

## 2023-07-04 NOTE — Progress Notes (Unsigned)
 Subjective:    Patient ID: George Villa, male    DOB: 1966-03-11, 57 y.o.   MRN: [id number]  HPI: George Villa is a 58 y.o. male who returns for follow up appointment for chronic pain and medication refill. He states his  pain is located in his lower back . He rates his pain 8. His current exercise regime is walking and performing stretching exercises.  George Villa Morphine equivalent is 60.00 MME.   Last Oral Swab was Performed on 05/13/2023, it was consistent.    Pain Inventory Average Pain 8 Pain Right Now 8 My pain is intermittent and dull  In the last 24 hours, has pain interfered with the following? General activity 7 Relation with others 7 Enjoyment of life 7 What TIME of day is your pain at its worst? night Sleep (in general) Fair  Pain is worse with: walking, bending, sitting, standing, and some activites Pain improves with: rest and medication Relief from Meds: 9  Family History  Problem Relation Age of Onset   Heart Problems Father    Hypertension Mother    Stroke Other    Social History   Socioeconomic History   Marital status: Single    Spouse name: Not on file   Number of children: Not on file   Years of education: Not on file   Highest education level: Not on file  Occupational History   Occupation: umemployed     Comment: Works in [employer]   Tobacco Use   Smoking status: Never   Smokeless tobacco: Never  Vaping Use   Vaping status: Never Used  Substance and Sexual Activity   Alcohol use: Yes    Alcohol/week: 6.0 standard drinks of alcohol    Types: 6 Cans of beer per week    Comment: OCCASSIONALLY    Drug use: No   Sexual activity: Yes  Other Topics Concern   Not on file  Social History Narrative   7 cups of caffeine a week    Social Drivers of Corporate investment banker Strain: Not on file  Food Insecurity: Not on file  Transportation Needs: Not on file  Physical Activity: Not on file  Stress: Not on file  Social  Connections: Not on file   Past Surgical History:  Procedure Laterality Date   BACK SURGERY     BRAIN HEMATOMA EVACUATION  2006   LEFT HEART CATHETERIZATION WITH CORONARY ANGIOGRAM N/A 02/17/2014   Procedure: LEFT HEART CATHETERIZATION WITH CORONARY ANGIOGRAM;  Surgeon: [name], MD;  Location: [facility] CATH LAB;  Service: Cardiovascular;  Laterality: N/A;   LUMBAR DISC SURGERY  2013   PERCUTANEOUS CORONARY STENT INTERVENTION (PCI-S)  02/17/2014   Procedure: PERCUTANEOUS CORONARY STENT INTERVENTION (PCI-S);  Surgeon: [name], MD;  Location: [facility] CATH LAB;  Service: Cardiovascular;;   TRACHEOSTOMY  05/2004   VENTRICULOSTOMY  2006   Past Surgical History:  Procedure Laterality Date   BACK SURGERY     BRAIN HEMATOMA EVACUATION  2006   LEFT HEART CATHETERIZATION WITH CORONARY ANGIOGRAM N/A 02/17/2014   Procedure: LEFT HEART CATHETERIZATION WITH CORONARY ANGIOGRAM;  Surgeon: [name], MD;  Location: [facility] CATH LAB;  Service: Cardiovascular;  Laterality: N/A;   LUMBAR DISC SURGERY  2013   PERCUTANEOUS CORONARY STENT INTERVENTION (PCI-S)  02/17/2014   Procedure: PERCUTANEOUS CORONARY STENT INTERVENTION (PCI-S);  Surgeon: [name], MD;  [facility] CATH LAB;  Service: Cardiovascular;;   TRACHEOSTOMY  05/2004  VENTRICULOSTOMY  2006   Past Medical History:  Diagnosis Date   Acid reflux    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   [name] 02/17/2014   Stroke (HCC) 2006   "left leg weak since" (02/18/2014)   BP 120/82   Pulse 93   Ht 5\' 7"  (1.702 m)   Wt 203 lb 3.2 oz (92.2 kg)   SpO2 95%   BMI 31.83 kg/m   Opioid Risk Score:   Fall Risk Score:  `1  Depression screen PHQ 2/9     07/05/2023   12:00 PM 06/07/2023    1:40 PM 05/13/2023    9:52 AM 04/12/2023    3:03 PM 02/15/2023    11:15 AM 08/24/2022   10:47 AM 07/27/2022   10:59 AM  Depression screen PHQ 2/9  Decreased Interest 0 0 1 0 0 0 0  Down, Depressed, Hopeless 0 0 1 0 0 0 0  PHQ - 2 Score 0 0 2 0 0 0 0      Review of Systems  All other systems reviewed and are negative.      Objective:   Physical Exam Vitals and nursing note reviewed.  Constitutional:      Appearance: Normal appearance.  Cardiovascular:     Rate and Rhythm: Normal rate and regular rhythm.     Pulses: Normal pulses.     Heart sounds: Normal heart sounds.  Pulmonary:     Effort: Pulmonary effort is normal.     Breath sounds: Normal breath sounds.  Musculoskeletal:     Comments: Normal Muscle Bulk and Muscle Testing Reveals:  Upper Extremities: Full ROM and Muscle Strength 5/5  Lumbar Paraspinal Tenderness: L-4-L-5 Lower Extremities : Full ROM and Muscle Strength 5/5 Arises from Table slowly using cane for support Abnormality Gait     Skin:    General: Skin is warm and dry.  Neurological:     Mental Status: He is alert and oriented to person, place, and time.  Psychiatric:        Mood and Affect: Mood normal.        Behavior: Behavior normal.         Assessment & Plan:  History of multiple strokes with gait disorder, ongoing ataxia and spastic left hemiparesis: Continue  HEP as Tolerated. 07/05/2023 2. Chronic low back pain with documented facet arthropathy and history of intradural defect/schwanomma requiring resection, per Dr. Riley Kill Note.  Continue current medication regimen. Refilled: Percocet 10/325 one q6 prn #120. 07/05/2023  We will continue the opioid monitoring program, this consists of regular clinic visits, examinations, urine drug screen, pill counts as well as use of [place] Controlled Substance Reporting system. A 12 month History has been reviewed on the [place] Controlled Substance Reporting System on 07/05/2023   F/U in 1 month

## 2023-07-05 ENCOUNTER — Telehealth: Payer: Self-pay | Admitting: *Deleted

## 2023-07-05 ENCOUNTER — Encounter: Payer: Self-pay | Admitting: Registered Nurse

## 2023-07-05 ENCOUNTER — Encounter: Payer: Medicare Other | Attending: Physical Medicine & Rehabilitation | Admitting: Registered Nurse

## 2023-07-05 ENCOUNTER — Telehealth: Payer: Self-pay | Admitting: Registered Nurse

## 2023-07-05 VITALS — BP 120/82 | HR 93 | Ht 67.0 in | Wt 203.2 lb

## 2023-07-05 DIAGNOSIS — Z5181 Encounter for therapeutic drug level monitoring: Secondary | ICD-10-CM | POA: Diagnosis not present

## 2023-07-05 DIAGNOSIS — G894 Chronic pain syndrome: Secondary | ICD-10-CM | POA: Insufficient documentation

## 2023-07-05 DIAGNOSIS — Z79891 Long term (current) use of opiate analgesic: Secondary | ICD-10-CM | POA: Diagnosis not present

## 2023-07-05 DIAGNOSIS — R269 Unspecified abnormalities of gait and mobility: Secondary | ICD-10-CM | POA: Insufficient documentation

## 2023-07-05 DIAGNOSIS — M47816 Spondylosis without myelopathy or radiculopathy, lumbar region: Secondary | ICD-10-CM | POA: Diagnosis not present

## 2023-07-05 NOTE — Telephone Encounter (Signed)
 Prior auth submitted to [employer] via CoverMyMeds (Key: [id number]).

## 2023-07-05 NOTE — Telephone Encounter (Signed)
 Outcome Approved today by [facility] Medicaid 2017 NCPDP Request Reference Number: [id number]. OXYCOD/APAP TAB 10-325MG  is approved through 01/05/2024. For further questions, call [employer] at [phone]. Effective Date: 07/05/2023 Authorization Expiration Date: 01/05/2024  Mr Vanduyne notified.

## 2023-07-05 NOTE — Telephone Encounter (Signed)
 Call Placed to George Villa, regarding PA approval. No answer. Left message to return the call.

## 2023-07-08 DIAGNOSIS — Z95810 Presence of automatic (implantable) cardiac defibrillator: Secondary | ICD-10-CM | POA: Diagnosis not present

## 2023-07-08 DIAGNOSIS — B9561 Methicillin susceptible Staphylococcus aureus infection as the cause of diseases classified elsewhere: Secondary | ICD-10-CM | POA: Diagnosis not present

## 2023-07-08 DIAGNOSIS — Z95811 Presence of heart assist device: Secondary | ICD-10-CM | POA: Diagnosis not present

## 2023-07-08 DIAGNOSIS — R7881 Bacteremia: Secondary | ICD-10-CM | POA: Diagnosis not present

## 2023-07-23 DIAGNOSIS — Z7901 Long term (current) use of anticoagulants: Secondary | ICD-10-CM | POA: Diagnosis not present

## 2023-07-23 DIAGNOSIS — I10 Essential (primary) hypertension: Secondary | ICD-10-CM | POA: Diagnosis not present

## 2023-07-23 DIAGNOSIS — I5082 Biventricular heart failure: Secondary | ICD-10-CM | POA: Diagnosis not present

## 2023-07-23 DIAGNOSIS — E66813 Obesity, class 3: Secondary | ICD-10-CM | POA: Diagnosis not present

## 2023-07-23 DIAGNOSIS — I5020 Unspecified systolic (congestive) heart failure: Secondary | ICD-10-CM | POA: Diagnosis not present

## 2023-07-23 DIAGNOSIS — Z95811 Presence of heart assist device: Secondary | ICD-10-CM | POA: Diagnosis not present

## 2023-07-23 DIAGNOSIS — Z6841 Body Mass Index (BMI) 40.0 and over, adult: Secondary | ICD-10-CM | POA: Diagnosis not present

## 2023-08-02 ENCOUNTER — Encounter: Attending: Physical Medicine & Rehabilitation | Admitting: Registered Nurse

## 2023-08-02 ENCOUNTER — Encounter: Payer: Self-pay | Admitting: Registered Nurse

## 2023-08-02 VITALS — BP 129/87 | HR 90 | Ht 67.0 in | Wt 201.8 lb

## 2023-08-02 DIAGNOSIS — G894 Chronic pain syndrome: Secondary | ICD-10-CM | POA: Insufficient documentation

## 2023-08-02 DIAGNOSIS — Z79891 Long term (current) use of opiate analgesic: Secondary | ICD-10-CM | POA: Diagnosis present

## 2023-08-02 DIAGNOSIS — R269 Unspecified abnormalities of gait and mobility: Secondary | ICD-10-CM | POA: Diagnosis present

## 2023-08-02 DIAGNOSIS — M47816 Spondylosis without myelopathy or radiculopathy, lumbar region: Secondary | ICD-10-CM | POA: Diagnosis present

## 2023-08-02 DIAGNOSIS — Z5181 Encounter for therapeutic drug level monitoring: Secondary | ICD-10-CM | POA: Diagnosis present

## 2023-08-02 MED ORDER — OXYCODONE-ACETAMINOPHEN 10-325 MG PO TABS
1.0000 | ORAL_TABLET | Freq: Four times a day (QID) | ORAL | 0 refills | Status: DC | PRN
Start: 2023-08-02 — End: 2023-08-30

## 2023-08-02 NOTE — Progress Notes (Signed)
 Subjective:    Patient ID: George Villa, male    DOB: 1965-07-27, 58 y.o.   MRN: [id number]  HPI: George Villa is a 58 y.o. male who returns for follow up appointment for chronic pain and medication refill. He states his  pain is located in his lower back. He rates his pain 8. His current exercise regime is walking , stationary bicycle daily and performing stretching exercises.  Mr. Cahoon Morphine  equivalent is 60.00 MME.   Last Oral Swab was Performed on 02/0-06/2023, it was consistent.      Pain Inventory Average Pain 8 Pain Right Now 8 My pain is dull  In the last 24 hours, has pain interfered with the following? General activity 7 Relation with others 7 Enjoyment of life 7 What TIME of day is your pain at its worst? night Sleep (in general) Fair  Pain is worse with: walking, bending, and standing Pain improves with: medication Relief from Meds: 9  Family History  Problem Relation Age of Onset   Heart Problems Father    Hypertension Mother    Stroke Other    Social History   Socioeconomic History   Marital status: Single    Spouse name: Not on file   Number of children: Not on file   Years of education: Not on file   Highest education level: Not on file  Occupational History   Occupation: umemployed     Comment: Works in [employer]   Tobacco Use   Smoking status: Never   Smokeless tobacco: Never  Vaping Use   Vaping status: Never Used  Substance and Sexual Activity   Alcohol  use: Yes    Alcohol /week: 6.0 standard drinks of alcohol     Types: 6 Cans of beer per week    Comment: OCCASSIONALLY    Drug use: No   Sexual activity: Yes  Other Topics Concern   Not on file  Social History Narrative   7 cups of caffeine a week    Social Drivers of Corporate investment banker Strain: Not on file  Food Insecurity: Not on file  Transportation Needs: Not on file  Physical Activity: Not on file  Stress: Not on file  Social Connections: Not on file    Past Surgical History:  Procedure Laterality Date   BACK SURGERY     BRAIN HEMATOMA EVACUATION  2006   LEFT HEART CATHETERIZATION WITH CORONARY ANGIOGRAM N/A 02/17/2014   Procedure: LEFT HEART CATHETERIZATION WITH CORONARY ANGIOGRAM;  Surgeon: Mickiel Albany, MD;  Location: [facility] CATH LAB;  Service: Cardiovascular;  Laterality: N/A;   LUMBAR DISC SURGERY  2013   PERCUTANEOUS CORONARY STENT INTERVENTION (PCI-S)  02/17/2014   Procedure: PERCUTANEOUS CORONARY STENT INTERVENTION (PCI-S);  Surgeon: Mickiel Albany, MD;  Location: [facility] CATH LAB;  Service: Cardiovascular;;   TRACHEOSTOMY  05/2004   VENTRICULOSTOMY  2006   Past Surgical History:  Procedure Laterality Date   BACK SURGERY     BRAIN HEMATOMA EVACUATION  2006   LEFT HEART CATHETERIZATION WITH CORONARY ANGIOGRAM N/A 02/17/2014   Procedure: LEFT HEART CATHETERIZATION WITH CORONARY ANGIOGRAM;  Surgeon: Mickiel Albany, MD;  Location: [facility] CATH LAB;  Service: Cardiovascular;  Laterality: N/A;   LUMBAR DISC SURGERY  2013   PERCUTANEOUS CORONARY STENT INTERVENTION (PCI-S)  02/17/2014   Procedure: PERCUTANEOUS CORONARY STENT INTERVENTION (PCI-S);  Surgeon: Mickiel Albany, MD;  Location: [facility] CATH LAB;  Service: Cardiovascular;;   TRACHEOSTOMY  05/2004   VENTRICULOSTOMY  2006   Past Medical History:  Diagnosis Date   Acid reflux    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   Maximo Spar 02/17/2014   Stroke (HCC) 2006   "left leg weak since" (02/18/2014)   BP 129/87   Pulse 90   Ht 5\' 7"  (1.702 m)   Wt 201 lb 12.8 oz (91.5 kg)   SpO2 96%   BMI 31.61 kg/m   Opioid Risk Score:   Fall Risk Score:  `1  Depression screen PHQ 2/9     08/02/2023    1:34 PM 07/05/2023   12:00 PM 06/07/2023    1:40 PM 05/13/2023    9:52 AM 04/12/2023    3:03 PM 02/15/2023    11:15 AM 08/24/2022   10:47 AM  Depression screen PHQ 2/9  Decreased Interest 0 0 0 1 0 0 0  Down, Depressed, Hopeless 0 0 0 1 0 0 0  PHQ - 2 Score 0 0 0 2 0 0 0    Review of Systems  Musculoskeletal:  Positive for back pain and gait problem.  All other systems reviewed and are negative.      Objective:   Physical Exam Vitals and nursing note reviewed.  Constitutional:      Appearance: Normal appearance.  Cardiovascular:     Rate and Rhythm: Normal rate and regular rhythm.     Pulses: Normal pulses.     Heart sounds: Normal heart sounds.  Pulmonary:     Effort: Pulmonary effort is normal.     Breath sounds: Normal breath sounds.  Musculoskeletal:     Comments: Normal Muscle Bulk and Muscle Testing Reveals:  Upper Extremities: Full ROM and Muscle Strength  5/5  Lumbar Paraspinal Tenderness: L-4-L-5 Lower Extremities : Full ROM and Muscle Strength 5/5 Arises from Chair slowly using cane for support Antalgic Gait     Skin:    General: Skin is warm and dry.  Neurological:     Mental Status: He is alert and oriented to person, place, and time.  Psychiatric:        Mood and Affect: Mood normal.        Behavior: Behavior normal.         Assessment & Plan:  History of multiple strokes with gait disorder, ongoing ataxia and spastic left hemiparesis: Continue  HEP as Tolerated. 08/02/2023 2. Chronic low back pain with documented facet arthropathy and history of intradural defect/schwanomma requiring resection, per Dr. Rachel Budds Note.  Continue current medication regimen. Refilled: Percocet 10/325 one q6 prn #120. 08/02/2023  We will continue the opioid monitoring program, this consists of regular clinic visits, examinations, urine drug screen, pill counts as well as use of [place]  Controlled Substance Reporting system. A 12 month History has been reviewed on the [place]  Controlled Substance Reporting System on 08/02/2023   F/U in 1 month

## 2023-08-29 NOTE — Progress Notes (Signed)
 Subjective:    Patient ID: George Villa, male    DOB: 08/09/1965, 58 y.o.   MRN: [id number]  HPI: George Villa is a 58 y.o. male who returns for follow up appointment for chronic pain and medication refill. He states his pain is located in his lower back. He rates his pain 7. His current exercise regime is walking and performing stretching exercises.  George Villa  equivalent is 60.00 MME.   Last Oral Swab was Performed on 05/13/2023, it was consistent.     Pain Inventory Average Pain 7 Pain Right Now 7 My pain is dull  In the last 24 hours, has pain interfered with the following? General activity 7 Relation with others 7 Enjoyment of life 7 What TIME of day is your pain at its worst? evening Sleep (in general) NA  Pain is worse with: walking, bending, and standing Pain improves with: medication Relief from Meds: 9  Family History  Problem Relation Age of Onset   Heart Problems Father    Hypertension Mother    Stroke Other    Social History   Socioeconomic History   Marital status: Single    Spouse name: Not on file   Number of children: Not on file   Years of education: Not on file   Highest education level: Not on file  Occupational History   Occupation: umemployed     Comment: Works in [employer]   Tobacco Use   Smoking status: Never   Smokeless tobacco: Never  Vaping Use   Vaping status: Never Used  Substance and Sexual Activity   Alcohol  use: Yes    Alcohol /week: 6.0 standard drinks of alcohol     Types: 6 Cans of beer per week    Comment: OCCASSIONALLY    Drug use: No   Sexual activity: Yes  Other Topics Concern   Not on file  Social History Narrative   7 cups of caffeine a week    Social Drivers of Corporate investment banker Strain: Not on file  Food Insecurity: Not on file  Transportation Needs: Not on file  Physical Activity: Not on file  Stress: Not on file  Social Connections: Not on file   Past Surgical History:   Procedure Laterality Date   BACK SURGERY     BRAIN HEMATOMA EVACUATION  2006   LEFT HEART CATHETERIZATION WITH CORONARY ANGIOGRAM N/A 02/17/2014   Procedure: LEFT HEART CATHETERIZATION WITH CORONARY ANGIOGRAM;  Surgeon: Mickiel Albany, MD;  Location: [facility] CATH LAB;  Service: Cardiovascular;  Laterality: N/A;   LUMBAR DISC SURGERY  2013   PERCUTANEOUS CORONARY STENT INTERVENTION (PCI-S)  02/17/2014   Procedure: PERCUTANEOUS CORONARY STENT INTERVENTION (PCI-S);  Surgeon: Mickiel Albany, MD;  [facility] C[facility] LAB;  Service: Cardiovascular;;   TRACHEOSTOMY  05/2004   VENTRICULOSTOMY  2006   Past Surgical History:  Procedure Laterality Date   BACK SURGERY     BRAIN HEMATOMA EVACUATION  2006   LEFT HEART CATHETERIZATION WITH CORONARY ANGIOGRAM N/A 02/17/2014   Procedure: LEFT HEART CATHETERIZATION WITH CORONARY ANGIOGRAM;  Surgeon: Mickiel Albany, MD;  Location: [facility] CATH LAB;  Service: Cardiovascular;  Laterality: N/A;   LUMBAR DISC SURGERY  2013   PERCUTANEOUS CORONARY STENT INTERVENTION (PCI-S)  02/17/2014   Procedure: PERCUTANEOUS CORONARY STENT INTERVENTION (PCI-S);  Surgeon: Mickiel Albany, MD;  Location: [facility] CATH LAB;  Service: Cardiovascular;;   TRACHEOSTOMY  05/2004   VENTRICULOSTOMY  2006   Past Medical  History:  Diagnosis Date   Acid reflux    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   Maximo Spar 02/17/2014   Stroke (HCC) 2006   "left leg weak since" (02/18/2014)   BP 129/87   Pulse 89   Ht 5\' 7"  (1.702 m)   Wt 202 lb 3.2 oz (91.7 kg)   SpO2 97%   BMI 31.67 kg/m   Opioid Risk Score:   Fall Risk Score:  `1  Depression screen PHQ 2/9     08/30/2023    1:06 PM 08/02/2023    1:34 PM 07/05/2023   12:00 PM 06/07/2023    1:40 PM 05/13/2023    9:52 AM 04/12/2023    3:03 PM 02/15/2023   11:15  AM  Depression screen PHQ 2/9  Decreased Interest 1 0 0 0 1 0 0  Down, Depressed, Hopeless 1 0 0 0 1 0 0  PHQ - 2 Score 2 0 0 0 2 0 0     Review of Systems  Musculoskeletal:  Positive for back pain.  All other systems reviewed and are negative.      Objective:   Physical Exam Vitals and nursing note reviewed.  Constitutional:      Appearance: Normal appearance.  Cardiovascular:     Rate and Rhythm: Normal rate and regular rhythm.     Pulses: Normal pulses.     Heart sounds: Normal heart sounds.  Pulmonary:     Effort: Pulmonary effort is normal.     Breath sounds: Normal breath sounds.  Musculoskeletal:     Comments: Normal Muscle Bulk and Muscle Testing Reveals:  Upper Extremities: Full ROM and Muscle Strength 5/5 Lumbar Paraspinal Tenderness: L-4-L-5 Lower Extremities : Full ROM and Muscle Strength 5/5 Arises from Table slowly using cane for support Antalgic Gait     Skin:    General: Skin is warm and dry.  Neurological:     Mental Status: He is alert and oriented to person, place, and time.  Psychiatric:        Mood and Affect: Mood normal.        Behavior: Behavior normal.         Assessment & Plan:  History of multiple strokes with gait disorder, ongoing ataxia and spastic left hemiparesis: Continue  HEP as Tolerated. 08/30/2023 2. Chronic low back pain with documented facet arthropathy and history of intradural defect/schwanomma requiring resection, per Dr. Rachel Budds Note.  Continue current medication regimen. Refilled: Percocet 10/325 one q6 prn #120. 08/30/2023  We will continue the opioid monitoring program, this consists of regular clinic visits, examinations, urine drug screen, pill counts as well as use of [place]  Controlled Substance Reporting system. A 12 month History has been reviewed on the [place]  Controlled Substance Reporting System on 08/30/2023   F/U in 1 month

## 2023-08-30 ENCOUNTER — Encounter: Payer: Self-pay | Admitting: Registered Nurse

## 2023-08-30 ENCOUNTER — Encounter: Attending: Physical Medicine & Rehabilitation | Admitting: Registered Nurse

## 2023-08-30 VITALS — BP 129/87 | HR 89 | Ht 67.0 in | Wt 202.2 lb

## 2023-08-30 DIAGNOSIS — G894 Chronic pain syndrome: Secondary | ICD-10-CM | POA: Insufficient documentation

## 2023-08-30 DIAGNOSIS — Z79891 Long term (current) use of opiate analgesic: Secondary | ICD-10-CM | POA: Diagnosis present

## 2023-08-30 DIAGNOSIS — Z5181 Encounter for therapeutic drug level monitoring: Secondary | ICD-10-CM | POA: Diagnosis not present

## 2023-08-30 DIAGNOSIS — M47816 Spondylosis without myelopathy or radiculopathy, lumbar region: Secondary | ICD-10-CM | POA: Insufficient documentation

## 2023-08-30 DIAGNOSIS — R269 Unspecified abnormalities of gait and mobility: Secondary | ICD-10-CM | POA: Diagnosis present

## 2023-08-30 MED ORDER — OXYCODONE-ACETAMINOPHEN 10-325 MG PO TABS: 1.0000 | ORAL_TABLET | Freq: Four times a day (QID) | ORAL | 0 refills | Status: DC | PRN

## 2023-09-03 ENCOUNTER — Telehealth: Payer: Self-pay | Admitting: Registered Nurse

## 2023-09-03 NOTE — Telephone Encounter (Signed)
 Return George Villa call,  No answer, left message to return the call.  Ladies can you see if he needed a PA. He reports he had to pay for his medication.

## 2023-09-03 NOTE — Telephone Encounter (Signed)
 P called and wants to know is something going on with his insurance bc he had to pay for meds out of pocket. He also asked has it been some sort of change to his meds

## 2023-09-05 NOTE — Telephone Encounter (Signed)
 I spoke with Pharmacy and let them know we have PA approval through 01/05/24.

## 2023-09-11 DIAGNOSIS — E6609 Other obesity due to excess calories: Secondary | ICD-10-CM | POA: Diagnosis not present

## 2023-09-11 DIAGNOSIS — Z6832 Body mass index (BMI) 32.0-32.9, adult: Secondary | ICD-10-CM | POA: Diagnosis not present

## 2023-09-11 DIAGNOSIS — E789 Disorder of lipoprotein metabolism, unspecified: Secondary | ICD-10-CM | POA: Diagnosis not present

## 2023-09-11 DIAGNOSIS — R7303 Prediabetes: Secondary | ICD-10-CM | POA: Diagnosis not present

## 2023-09-11 DIAGNOSIS — N401 Enlarged prostate with lower urinary tract symptoms: Secondary | ICD-10-CM | POA: Diagnosis not present

## 2023-09-11 DIAGNOSIS — R351 Nocturia: Secondary | ICD-10-CM | POA: Diagnosis not present

## 2023-09-11 DIAGNOSIS — E559 Vitamin D deficiency, unspecified: Secondary | ICD-10-CM | POA: Diagnosis not present

## 2023-09-11 DIAGNOSIS — D539 Nutritional anemia, unspecified: Secondary | ICD-10-CM | POA: Diagnosis not present

## 2023-09-11 DIAGNOSIS — K219 Gastro-esophageal reflux disease without esophagitis: Secondary | ICD-10-CM | POA: Diagnosis not present

## 2023-09-11 DIAGNOSIS — I10 Essential (primary) hypertension: Secondary | ICD-10-CM | POA: Diagnosis not present

## 2023-09-11 DIAGNOSIS — N1831 Chronic kidney disease, stage 3a: Secondary | ICD-10-CM | POA: Diagnosis not present

## 2023-09-11 DIAGNOSIS — N529 Male erectile dysfunction, unspecified: Secondary | ICD-10-CM | POA: Diagnosis not present

## 2023-09-27 ENCOUNTER — Telehealth: Payer: Self-pay | Admitting: Registered Nurse

## 2023-09-27 ENCOUNTER — Encounter: Attending: Physical Medicine & Rehabilitation | Admitting: Registered Nurse

## 2023-09-27 ENCOUNTER — Encounter: Payer: Self-pay | Admitting: Registered Nurse

## 2023-09-27 VITALS — BP 118/82 | HR 92 | Ht 67.0 in | Wt 203.6 lb

## 2023-09-27 DIAGNOSIS — M47816 Spondylosis without myelopathy or radiculopathy, lumbar region: Secondary | ICD-10-CM | POA: Insufficient documentation

## 2023-09-27 DIAGNOSIS — R269 Unspecified abnormalities of gait and mobility: Secondary | ICD-10-CM | POA: Diagnosis not present

## 2023-09-27 DIAGNOSIS — Z79891 Long term (current) use of opiate analgesic: Secondary | ICD-10-CM | POA: Diagnosis not present

## 2023-09-27 DIAGNOSIS — G894 Chronic pain syndrome: Secondary | ICD-10-CM | POA: Insufficient documentation

## 2023-09-27 DIAGNOSIS — Z5181 Encounter for therapeutic drug level monitoring: Secondary | ICD-10-CM | POA: Diagnosis not present

## 2023-09-27 MED ORDER — OXYCODONE-ACETAMINOPHEN 10-325 MG PO TABS: 1.0000 | ORAL_TABLET | Freq: Four times a day (QID) | ORAL | 0 refills | Status: DC | PRN

## 2023-09-27 NOTE — Progress Notes (Signed)
 Subjective:    Patient ID: George Villa, male    DOB: June 14, 1965, 58 y.o.   MRN: [id number]  HPI: George Villa is a 58 y.o. male who returns for follow up appointment for chronic pain and medication refill. He states his pain is located in his lower back. He rates his pain 8.His  current exercise regime is walking and riding his stationary bicycle for 30 minutes 5 days a week.   Mr. Kapur Morphine  equivalent is 60.00 MME.   Oral Swab was Performed today.     Pain Inventory Average Pain 8 Pain Right Now 8 My pain is dull  In the last 24 hours, has pain interfered with the following? General activity 8 Relation with others 8 Enjoyment of life 8 What TIME of day is your pain at its worst? evening Sleep (in general) Fair  Pain is worse with: walking, bending, sitting, standing, and some activites Pain improves with: rest and medication Relief from Meds: 9  Family History  Problem Relation Age of Onset   Heart Problems Father    Hypertension Mother    Stroke Other    Social History   Socioeconomic History   Marital status: Single    Spouse name: Not on file   Number of children: Not on file   Years of education: Not on file   Highest education level: Not on file  Occupational History   Occupation: umemployed     Comment: Works in [employer]   Tobacco Use   Smoking status: Never   Smokeless tobacco: Never  Vaping Use   Vaping status: Never Used  Substance and Sexual Activity   Alcohol  use: Yes    Alcohol /week: 6.0 standard drinks of alcohol     Types: 6 Cans of beer per week    Comment: OCCASSIONALLY    Drug use: No   Sexual activity: Yes  Other Topics Concern   Not on file  Social History Narrative   7 cups of caffeine a week    Social Drivers of Corporate investment banker Strain: Not on file  Food Insecurity: Not on file  Transportation Needs: Not on file  Physical Activity: Not on file  Stress: Not on file  Social Connections: Not on file    Past Surgical History:  Procedure Laterality Date   BACK SURGERY     BRAIN HEMATOMA EVACUATION  2006   LEFT HEART CATHETERIZATION WITH CORONARY ANGIOGRAM N/A 02/17/2014   Procedure: LEFT HEART CATHETERIZATION WITH CORONARY ANGIOGRAM;  Surgeon: Victory LELON Sharps DOUGLAS, MD;  Location: [facility] CATH LAB;  Service: Cardiovascular;  Laterality: N/A;   LUMBAR DISC SURGERY  2013   PERCUTANEOUS CORONARY STENT INTERVENTION (PCI-S)  02/17/2014   Procedure: PERCUTANEOUS CORONARY STENT INTERVENTION (PCI-S);  Surgeon: Victory LELON Sharps DOUGLAS, MD;  Location: [facility] CATH LAB;  Service: Cardiovascular;;   TRACHEOSTOMY  05/2004   VENTRICULOSTOMY  2006   Past Surgical History:  Procedure Laterality Date   BACK SURGERY     BRAIN HEMATOMA EVACUATION  2006   LEFT HEART CATHETERIZATION WITH CORONARY ANGIOGRAM N/A 02/17/2014   Procedure: LEFT HEART CATHETERIZATION WITH CORONARY ANGIOGRAM;  Surgeon: Victory LELON Sharps DOUGLAS, MD;  Location: [facility] CATH LAB;  Service: Cardiovascular;  Laterality: N/A;   LUMBAR DISC SURGERY  2013   PERCUTANEOUS CORONARY STENT INTERVENTION (PCI-S)  02/17/2014   Procedure: PERCUTANEOUS CORONARY STENT INTERVENTION (PCI-S);  Surgeon: Victory LELON Sharps DOUGLAS, MD;  Location: [facility] CATH LAB;  Service: Cardiovascular;;   TRACHEOSTOMY  05/2004  VENTRICULOSTOMY  2006   Past Medical History:  Diagnosis Date   Acid reflux    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   thelbert 02/17/2014   Stroke (HCC) 2006   left leg weak since (02/18/2014)   BP 118/82 (BP Location: Left Arm, Patient Position: Sitting, Cuff Size: Large)   Pulse 92   Ht 5' 7 (1.702 m)   Wt 203 lb 9.6 oz (92.4 kg)   SpO2 98%   BMI 31.89 kg/m   Opioid Risk Score:   Fall Risk Score:  `1  Depression screen PHQ 2/9     09/27/2023    1:03 PM 08/30/2023    1:06 PM 08/02/2023     1:34 PM 07/05/2023   12:00 PM 06/07/2023    1:40 PM 05/13/2023    9:52 AM 04/12/2023    3:03 PM  Depression screen PHQ 2/9  Decreased Interest 0 1 0 0 0 1 0  Down, Depressed, Hopeless 0 1 0 0 0 1 0  PHQ - 2 Score 0 2 0 0 0 2 0     Review of Systems  Musculoskeletal:  Positive for back pain and gait problem.       Lower back pain  All other systems reviewed and are negative.      Objective:   Physical Exam Vitals and nursing note reviewed.  Constitutional:      Appearance: Normal appearance.   Cardiovascular:     Rate and Rhythm: Normal rate and regular rhythm.     Pulses: Normal pulses.     Heart sounds: Normal heart sounds.  Pulmonary:     Effort: Pulmonary effort is normal.     Breath sounds: Normal breath sounds.   Musculoskeletal:     Comments: Normal Muscle Bulk and Muscle Testing Reveals:  Upper Extremities: Full ROM and Muscle Strength 5/5 Lumbar Paraspinal Tenderness: L-4-L-5  Lower Extremities: Full ROM and Muscle Strength 5/5 Arises from Chair slowly using cane for support Antalgic  Gait      Skin:    General: Skin is warm and dry.   Neurological:     Mental Status: He is alert and oriented to person, place, and time.         Assessment & Plan:  History of multiple strokes with gait disorder, ongoing ataxia and spastic left hemiparesis: Continue  HEP as Tolerated. 09/27/2023 2. Chronic low back pain with documented facet arthropathy and history of intradural defect/schwanomma requiring resection, per Dr. Babs Note.  Continue current medication regimen. Refilled: Percocet 10/325 one q6 prn #120. 09/27/2023  We will continue the opioid monitoring program, this consists of regular clinic visits, examinations, urine drug screen, pill counts as well as use of [place]  Controlled Substance Reporting system. A 12 month History has been reviewed on the [place]  Controlled Substance Reporting System on 09/27/2023   F/U in 1 month

## 2023-09-27 NOTE — Telephone Encounter (Signed)
 P called and said the pharmacy is making him pay out of pocket for oxycodone  so he is wanting to  know if prescription can be sent to [facility] pharmacy

## 2023-09-27 NOTE — Telephone Encounter (Signed)
 Call placed to [facility], pharmacy states Medicaid has Mr. Matthias Sor down for multiple insurances.  Return George Villa call, he was instructed to call medicaid and speak with them regarding the above problem, and to return the call . He verbalizes understanding.

## 2023-10-01 LAB — DRUG TOX MONITOR 1 W/CONF, ORAL FLD
Amphetamines: NEGATIVE ng/mL (ref <10)
Barbiturates: NEGATIVE ng/mL (ref <10)
Benzodiazepines: NEGATIVE ng/mL (ref <0.50)
Buprenorphine: NEGATIVE ng/mL (ref <0.10)
Cocaine: NEGATIVE ng/mL (ref <5.0)
Codeine: NEGATIVE ng/mL (ref <2.5)
Dihydrocodeine: NEGATIVE ng/mL (ref <2.5)
Fentanyl: NEGATIVE ng/mL (ref <0.10)
Heroin Metabolite: NEGATIVE ng/mL (ref <1.0)
Hydrocodone: NEGATIVE ng/mL (ref <2.5)
Hydromorphone: NEGATIVE ng/mL (ref <2.5)
MARIJUANA: NEGATIVE ng/mL (ref <2.5)
MDMA: NEGATIVE ng/mL (ref <10)
Meprobamate: NEGATIVE ng/mL (ref <2.5)
Methadone: NEGATIVE ng/mL (ref <5.0)
Morphine: NEGATIVE ng/mL (ref <2.5)
Nicotine Metabolite: NEGATIVE ng/mL (ref <5.0)
Norhydrocodone: NEGATIVE ng/mL (ref <2.5)
Noroxycodone: 12.7 ng/mL — ABNORMAL HIGH (ref <2.5)
Opiates: POSITIVE ng/mL — AB (ref <2.5)
Oxycodone: 60.4 ng/mL — ABNORMAL HIGH (ref <2.5)
Oxymorphone: NEGATIVE ng/mL (ref <2.5)
Phencyclidine: NEGATIVE ng/mL (ref <10)
Tapentadol: NEGATIVE ng/mL (ref <5.0)
Tramadol: NEGATIVE ng/mL (ref <5.0)
Zolpidem: NEGATIVE ng/mL (ref <5.0)

## 2023-10-01 LAB — DRUG TOX ALC METAB W/CON, ORAL FLD: Alcohol Metabolite: NEGATIVE ng/mL (ref <25)

## 2023-10-08 DIAGNOSIS — Z760 Encounter for issue of repeat prescription: Secondary | ICD-10-CM | POA: Diagnosis not present

## 2023-10-08 DIAGNOSIS — I472 Ventricular tachycardia, unspecified: Secondary | ICD-10-CM | POA: Diagnosis not present

## 2023-10-24 NOTE — Progress Notes (Unsigned)
 Subjective:    Patient ID: George Villa, male    DOB: 1965-05-03, 58 y.o.   MRN: [id number]  HPI: George Villa is a 58 y.o. male who returns for follow up appointment for chronic pain and medication refill. He states his pain is located in his lower back. He rates his pain 9. His current exercise regime is walking, using his peddler 5 days a week for 30 minutes and performing stretching exercises.  [name]  Morphine  equivalent is 60.00 MME.   Last Oral Swab was Performed on 09/27/2023, it was consistent.      Pain Inventory Average Pain 9 Pain Right Now 9 My pain is dull  In the last 24 hours, has pain interfered with the following? General activity 8 Relation with others 8 Enjoyment of life 8 What TIME of day is your pain at its worst? evening Sleep (in general) Good  Pain is worse with: walking, bending, and standing Pain improves with: medication Relief from Meds: 9  Family History  Problem Relation Age of Onset   Heart Problems Father    Hypertension Mother    Stroke Other    Social History   Socioeconomic History   Marital status: Single    Spouse name: Not on file   Number of children: Not on file   Years of education: Not on file   Highest education level: Not on file  Occupational History   Occupation: umemployed     Comment: Works in [employer]   Tobacco Use   Smoking status: Never   Smokeless tobacco: Never  Vaping Use   Vaping status: Never Used  Substance and Sexual Activity   Alcohol  use: Yes    Alcohol /week: 6.0 standard drinks of alcohol     Types: 6 Cans of beer per week    Comment: OCCASSIONALLY    Drug use: No   Sexual activity: Yes  Other Topics Concern   Not on file  Social History Narrative   7 cups of caffeine a week    Social Drivers of Corporate investment banker Strain: Not on file  Food Insecurity: Not on file  Transportation Needs: Not on file  Physical Activity: Not on file  Stress: Not on file  Social  Connections: Not on file   Past Surgical History:  Procedure Laterality Date   BACK SURGERY     BRAIN HEMATOMA EVACUATION  2006   LEFT HEART CATHETERIZATION WITH CORONARY ANGIOGRAM N/A 02/17/2014   Procedure: LEFT HEART CATHETERIZATION WITH CORONARY ANGIOGRAM;  Surgeon: Victory LELON Sharps DOUGLAS, MD;  Location: [facility] CATH LAB;  Service: Cardiovascular;  Laterality: N/A;   LUMBAR DISC SURGERY  2013   PERCUTANEOUS CORONARY STENT INTERVENTION (PCI-S)  02/17/2014   Procedure: PERCUTANEOUS CORONARY STENT INTERVENTION (PCI-S);  Surgeon: Victory LELON Sharps DOUGLAS, MD;  Location: [facility] CATH LAB;  Service: Cardiovascular;;   TRACHEOSTOMY  05/2004   VENTRICULOSTOMY  2006   Past Surgical History:  Procedure Laterality Date   BACK SURGERY     BRAIN HEMATOMA EVACUATION  2006   LEFT HEART CATHETERIZATION WITH CORONARY ANGIOGRAM N/A 02/17/2014   Procedure: LEFT HEART CATHETERIZATION WITH CORONARY ANGIOGRAM;  Surgeon: Victory LELON Sharps DOUGLAS, MD;  Location: [facility] CATH LAB;  Service: Cardiovascular;  Laterality: N/A;   LUMBAR DISC SURGERY  2013   PERCUTANEOUS CORONARY STENT INTERVENTION (PCI-S)  02/17/2014   Procedure: PERCUTANEOUS CORONARY STENT INTERVENTION (PCI-S);  Surgeon: Victory LELON Sharps DOUGLAS, MD;  Location: [facility] CATH LAB;  Service: Cardiovascular;;  TRACHEOSTOMY  05/2004   VENTRICULOSTOMY  2006   Past Medical History:  Diagnosis Date   Acid reflux    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   thelbert 02/17/2014   Stroke (HCC) 2006   left leg weak since (02/18/2014)   BP (!) 134/95   Pulse 83   Resp 16   Ht 5' 7 (1.702 m)   Wt 205 lb 6.4 oz (93.2 kg)   SpO2 95%   BMI 32.17 kg/m   Opioid Risk Score:   Fall Risk Score:  `1  Depression screen PHQ 2/9     10/25/2023    1:39 PM 09/27/2023    1:03 PM 08/30/2023    1:06 PM 08/02/2023     1:34 PM 07/05/2023   12:00 PM 06/07/2023    1:40 PM 05/13/2023    9:52 AM  Depression screen PHQ 2/9  Decreased Interest 0 0 1 0 0 0 1  Down, Depressed, Hopeless 0 0 1 0 0 0 1  PHQ - 2 Score 0 0 2 0 0 0 2  Altered sleeping 0        Tired, decreased energy 1        Change in appetite 0        Feeling bad or failure about yourself  0        Trouble concentrating 0        Moving slowly or fidgety/restless 0        Suicidal thoughts 0        PHQ-9 Score 1        Difficult doing work/chores Somewhat difficult          Review of Systems  Musculoskeletal:  Positive for back pain.       Objective:   Physical Exam Vitals and nursing note reviewed.  Constitutional:      Appearance: Normal appearance.  Cardiovascular:     Rate and Rhythm: Normal rate and regular rhythm.     Pulses: Normal pulses.     Heart sounds: Normal heart sounds.  Pulmonary:     Effort: Pulmonary effort is normal.     Breath sounds: Normal breath sounds.  Musculoskeletal:     Comments: Normal Muscle Bulk and Muscle Testing Reveals:  Upper Extremities: Full ROM and Muscle Strength 5/5 , Lumbar Paraspinal Tenderness: L-4-L-5 Lower Extremities: Full ROM and Muscle Strength 5/5 Arises from Table slowly using cane for support Abnormality  Gait     Skin:    General: Skin is warm and dry.  Neurological:     Mental Status: He is alert and oriented to person, place, and time.  Psychiatric:        Mood and Affect: Mood normal.        Behavior: Behavior normal.          Assessment & Plan:  History of multiple strokes with gait disorder, ongoing ataxia and spastic left hemiparesis: Continue  HEP as Tolerated. 10/25/2023 2. Chronic low back pain with documented facet arthropathy and history of intradural defect/schwanomma requiring resection, per Dr. Babs Note.  Continue current medication regimen. Refilled: Percocet 10/325 one q6 prn #120.Second prescription sent for the following month.  10/25/2023  We will  continue the opioid monitoring program, this consists of regular clinic visits, examinations, urine  drug screen, pill counts as well as use of [place]  Controlled Substance Reporting system. A 12 month History has been reviewed on the [place]  Controlled Substance Reporting System on 10/25/2023   F/U in 1 month

## 2023-10-25 ENCOUNTER — Encounter: Payer: Self-pay | Admitting: Registered Nurse

## 2023-10-25 ENCOUNTER — Encounter: Attending: Physical Medicine & Rehabilitation | Admitting: Registered Nurse

## 2023-10-25 VITALS — BP 136/92 | HR 83 | Resp 16 | Ht 67.0 in | Wt 205.4 lb

## 2023-10-25 DIAGNOSIS — G894 Chronic pain syndrome: Secondary | ICD-10-CM | POA: Insufficient documentation

## 2023-10-25 DIAGNOSIS — Z5181 Encounter for therapeutic drug level monitoring: Secondary | ICD-10-CM | POA: Insufficient documentation

## 2023-10-25 DIAGNOSIS — M47816 Spondylosis without myelopathy or radiculopathy, lumbar region: Secondary | ICD-10-CM | POA: Insufficient documentation

## 2023-10-25 DIAGNOSIS — Z79891 Long term (current) use of opiate analgesic: Secondary | ICD-10-CM | POA: Diagnosis not present

## 2023-10-25 DIAGNOSIS — R269 Unspecified abnormalities of gait and mobility: Secondary | ICD-10-CM | POA: Diagnosis not present

## 2023-10-25 MED ORDER — OXYCODONE-ACETAMINOPHEN 10-325 MG PO TABS: 1.0000 | ORAL_TABLET | Freq: Four times a day (QID) | ORAL | 0 refills | Status: DC | PRN

## 2023-10-28 DIAGNOSIS — T827XXD Infection and inflammatory reaction due to other cardiac and vascular devices, implants and grafts, subsequent encounter: Secondary | ICD-10-CM | POA: Diagnosis not present

## 2023-10-28 DIAGNOSIS — Z95810 Presence of automatic (implantable) cardiac defibrillator: Secondary | ICD-10-CM | POA: Diagnosis not present

## 2023-10-28 DIAGNOSIS — B9561 Methicillin susceptible Staphylococcus aureus infection as the cause of diseases classified elsewhere: Secondary | ICD-10-CM | POA: Diagnosis not present

## 2023-10-28 DIAGNOSIS — R7881 Bacteremia: Secondary | ICD-10-CM | POA: Diagnosis not present

## 2023-10-28 DIAGNOSIS — Z95811 Presence of heart assist device: Secondary | ICD-10-CM | POA: Diagnosis not present

## 2023-11-01 ENCOUNTER — Ambulatory Visit: Admitting: Registered Nurse

## 2023-11-19 DIAGNOSIS — Z95810 Presence of automatic (implantable) cardiac defibrillator: Secondary | ICD-10-CM | POA: Diagnosis not present

## 2023-11-20 DIAGNOSIS — Z95810 Presence of automatic (implantable) cardiac defibrillator: Secondary | ICD-10-CM | POA: Diagnosis not present

## 2023-12-19 NOTE — Progress Notes (Signed)
 Subjective:    Patient ID: George Villa, male    DOB: 1965-07-03, 58 y.o.   MRN: [id number]  HPI: George Villa is a 58 y.o. male who returns for follow up appointment for chronic pain and medication refill. He states his pain is located in his lower back. He rates his pain 8. His current exercise regime is walking, stationary bicycle 30 minutes 5 days a week and performing stretching exercises.  George Villa Morphine  equivalent is 60.00 MME.   Oral Swab was Performed today.      Pain Inventory Average Pain 8 Pain Right Now 8 My pain is intermittent and dull  In the last 24 hours, has pain interfered with the following?  General activity 8 Relation with others 8 Enjoyment of life 8 What TIME of day is your pain at its worst? evening Sleep (in general) Fair  Pain is worse with: walking and standing Pain improves with: rest and medication Relief from Meds: 8  Family History  Problem Relation Age of Onset   Heart Problems Father    Hypertension Mother    Stroke Other    Social History   Socioeconomic History   Marital status: Single    Spouse name: Not on file   Number of children: Not on file   Years of education: Not on file   Highest education level: Not on file  Occupational History   Occupation: umemployed     Comment: Works in [employer]   Tobacco Use   Smoking status: Never   Smokeless tobacco: Never  Vaping Use   Vaping status: Never Used  Substance and Sexual Activity   Alcohol  use: Yes    Alcohol /week: 6.0 standard drinks of alcohol     Types: 6 Cans of beer per week    Comment: OCCASSIONALLY    Drug use: No   Sexual activity: Yes  Other Topics Concern   Not on file  Social History Narrative   7 cups of caffeine a week    Social Drivers of Corporate investment banker Strain: Not on file  Food Insecurity: Not on file  Transportation Needs: Not on file  Physical Activity: Not on file  Stress: Not on file  Social Connections: Not on  file   Past Surgical History:  Procedure Laterality Date   BACK SURGERY     BRAIN HEMATOMA EVACUATION  2006   LEFT HEART CATHETERIZATION WITH CORONARY ANGIOGRAM N/A 02/17/2014   Procedure: LEFT HEART CATHETERIZATION WITH CORONARY ANGIOGRAM;  Surgeon: George LELON Sharps DOUGLAS, MD;  Location: [facility] CATH LAB;  Service: Cardiovascular;  Laterality: N/A;   LUMBAR DISC SURGERY  2013   PERCUTANEOUS CORONARY STENT INTERVENTION (PCI-S)  02/17/2014   Procedure: PERCUTANEOUS CORONARY STENT INTERVENTION (PCI-S);  Surgeon: George LELON Sharps DOUGLAS, MD;  Location: [facility] CATH LAB;  Service: Cardiovascular;;   TRACHEOSTOMY  05/2004   VENTRICULOSTOMY  2006   Past Surgical History:  Procedure Laterality Date   BACK SURGERY     BRAIN HEMATOMA EVACUATION  2006   LEFT HEART CATHETERIZATION WITH CORONARY ANGIOGRAM N/A 02/17/2014   Procedure: LEFT HEART CATHETERIZATION WITH CORONARY ANGIOGRAM;  Surgeon: George LELON Sharps DOUGLAS, MD;  Location: [facility] CATH LAB;  Service: Cardiovascular;  Laterality: N/A;   LUMBAR DISC SURGERY  2013   PERCUTANEOUS CORONARY STENT INTERVENTION (PCI-S)  02/17/2014   Procedure: PERCUTANEOUS CORONARY STENT INTERVENTION (PCI-S);  Surgeon: George LELON Sharps DOUGLAS, MD;  Location: [facility] CATH LAB;  Service: Cardiovascular;;   TRACHEOSTOMY  05/2004  VENTRICULOSTOMY  2006   Past Medical History:  Diagnosis Date   Acid reflux    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  Opioid Risk Score:   Fall Risk Score:  `1  Depression screen PHQ 2/9     10/25/2023    1:39 PM 09/27/2023    1:03 PM 08/30/2023    1:06 PM 08/02/2023    1:34 PM 07/05/2023   12:00 PM 06/07/2023    1:40 PM 05/13/2023    9:52 AM  Depression screen PHQ 2/9   Decreased Interest 0 0 1 0 0 0 1  Down, Depressed, Hopeless 0 0 1 0 0 0 1  PHQ - 2 Score 0 0 2 0 0 0 2  Altered sleeping 0        Tired, decreased energy 1        Change in appetite 0        Feeling bad or failure about yourself  0        Trouble concentrating 0        Moving slowly or fidgety/restless 0        Suicidal thoughts 0        PHQ-9 Score 1        Difficult doing work/chores Somewhat difficult          Review of Systems  Musculoskeletal:  Positive for back pain and gait problem.  All other systems reviewed and are negative.      Objective:   Physical Exam Vitals and nursing note reviewed.  Constitutional:      Appearance: Normal appearance.  Cardiovascular:     Rate and Rhythm: Normal rate and regular rhythm.     Pulses: Normal pulses.     Heart sounds: Normal heart sounds.  Pulmonary:     Effort: Pulmonary effort is normal.     Breath sounds: Normal breath sounds.  Musculoskeletal:     Comments: Normal Muscle Bulk and Muscle Testing Reveals:  Upper Extremities: Full ROM and Muscle Strength 5/5 Lumbar Paraspinal Tenderness: L-4-L-5 Lower Extremities: Full ROM and Muscle Strength 5/5 Arises from chair slowly using cane for support Abnormality  Gait     Skin:    General: Skin is warm and dry.  Neurological:     Mental Status: He is alert and oriented to person, place, and time.  Psychiatric:        Mood and Affect: Mood normal.        Behavior: Behavior normal.           Assessment & Plan:  History of multiple strokes with gait disorder, ongoing ataxia and spastic left hemiparesis: Continue  HEP as Tolerated. 12/20/2023 2. Chronic low back pain with documented facet arthropathy and history of intradural defect/schwanomma requiring resection, per Dr. Babs Note.  Continue current medication regimen. Refilled: Percocet 10/325 one q6 prn #120.Second prescription sent for the following month.  12/20/2023  We will continue the opioid monitoring program,  this consists of regular clinic visits, examinations, urine drug screen, pill counts as well as use of [place]  Controlled Substance Reporting system. A 12 month History has been reviewed on the  [place]  Controlled Substance Reporting System on 12/20/2023   F/U in 1 month

## 2023-12-20 ENCOUNTER — Encounter: Attending: Physical Medicine & Rehabilitation | Admitting: Registered Nurse

## 2023-12-20 ENCOUNTER — Encounter: Payer: Self-pay | Admitting: Registered Nurse

## 2023-12-20 VITALS — BP 117/78 | HR 81 | Ht 67.0 in | Wt 207.0 lb

## 2023-12-20 DIAGNOSIS — G894 Chronic pain syndrome: Secondary | ICD-10-CM | POA: Diagnosis present

## 2023-12-20 DIAGNOSIS — M47816 Spondylosis without myelopathy or radiculopathy, lumbar region: Secondary | ICD-10-CM | POA: Insufficient documentation

## 2023-12-20 DIAGNOSIS — Z5181 Encounter for therapeutic drug level monitoring: Secondary | ICD-10-CM | POA: Insufficient documentation

## 2023-12-20 DIAGNOSIS — R269 Unspecified abnormalities of gait and mobility: Secondary | ICD-10-CM | POA: Diagnosis present

## 2023-12-20 DIAGNOSIS — Z79891 Long term (current) use of opiate analgesic: Secondary | ICD-10-CM | POA: Insufficient documentation

## 2023-12-20 MED ORDER — OXYCODONE-ACETAMINOPHEN 10-325 MG PO TABS: 1.0000 | ORAL_TABLET | Freq: Four times a day (QID) | ORAL | 0 refills | Status: DC | PRN

## 2023-12-25 LAB — DRUG TOX MONITOR 1 W/CONF, ORAL FLD
Amphetamines: NEGATIVE ng/mL (ref <10)
Barbiturates: NEGATIVE ng/mL (ref <10)
Benzodiazepines: NEGATIVE ng/mL (ref <0.50)
Buprenorphine: NEGATIVE ng/mL (ref <0.10)
Cocaine: NEGATIVE ng/mL (ref <5.0)
Codeine: NEGATIVE ng/mL (ref <2.5)
Dihydrocodeine: NEGATIVE ng/mL (ref <2.5)
Fentanyl: NEGATIVE ng/mL (ref <0.10)
Heroin Metabolite: NEGATIVE ng/mL (ref <1.0)
Hydrocodone: NEGATIVE ng/mL (ref <2.5)
Hydromorphone: NEGATIVE ng/mL (ref <2.5)
MARIJUANA: NEGATIVE ng/mL (ref <2.5)
MDMA: NEGATIVE ng/mL (ref <10)
Meprobamate: NEGATIVE ng/mL (ref <2.5)
Methadone: NEGATIVE ng/mL (ref <5.0)
Morphine: NEGATIVE ng/mL (ref <2.5)
Nicotine Metabolite: NEGATIVE ng/mL (ref <5.0)
Norhydrocodone: NEGATIVE ng/mL (ref <2.5)
Noroxycodone: 22.6 ng/mL — ABNORMAL HIGH (ref <2.5)
Opiates: POSITIVE ng/mL — AB (ref <2.5)
Oxycodone: 94.7 ng/mL — ABNORMAL HIGH (ref <2.5)
Oxymorphone: NEGATIVE ng/mL (ref <2.5)
Phencyclidine: NEGATIVE ng/mL (ref <10)
Tapentadol: NEGATIVE ng/mL (ref <5.0)
Tramadol: NEGATIVE ng/mL (ref <5.0)
Zolpidem: NEGATIVE ng/mL (ref <5.0)

## 2023-12-25 LAB — DRUG TOX ALC METAB W/CON, ORAL FLD: Alcohol Metabolite: NEGATIVE ng/mL (ref <25)

## 2023-12-30 ENCOUNTER — Encounter: Payer: Self-pay | Admitting: Registered Nurse

## 2024-01-21 ENCOUNTER — Telehealth: Payer: Self-pay | Admitting: Registered Nurse

## 2024-01-21 NOTE — Telephone Encounter (Signed)
 Call placed to George Villa,  His prescription was already sent to pharmacy, Left message regarding the above.

## 2024-01-21 NOTE — Telephone Encounter (Signed)
 Patient needs refill on oxycodone  and he thinks it will need prior auth as well.

## 2024-01-22 NOTE — Telephone Encounter (Signed)
 Request Reference Number: [id number]. OXYCOD/APAP TAB 10-325MG  is approved through 07/22/2024. For further questions, call [employer] at [phone]. Effective Date: 01/22/2024 Authorization Expiration Date: 07/22/2024

## 2024-01-22 NOTE — Telephone Encounter (Signed)
 PA for Oxycodone  10-325 MG submitted in Cover My Med.

## 2024-02-21 ENCOUNTER — Encounter: Payer: Self-pay | Admitting: Registered Nurse

## 2024-02-21 ENCOUNTER — Encounter: Attending: Physical Medicine & Rehabilitation | Admitting: Registered Nurse

## 2024-02-21 VITALS — BP 127/82 | HR 91 | Ht 67.0 in | Wt 204.0 lb

## 2024-02-21 DIAGNOSIS — G894 Chronic pain syndrome: Secondary | ICD-10-CM | POA: Insufficient documentation

## 2024-02-21 DIAGNOSIS — Z5181 Encounter for therapeutic drug level monitoring: Secondary | ICD-10-CM | POA: Diagnosis present

## 2024-02-21 DIAGNOSIS — R269 Unspecified abnormalities of gait and mobility: Secondary | ICD-10-CM | POA: Diagnosis not present

## 2024-02-21 DIAGNOSIS — Z79891 Long term (current) use of opiate analgesic: Secondary | ICD-10-CM | POA: Insufficient documentation

## 2024-02-21 DIAGNOSIS — M47816 Spondylosis without myelopathy or radiculopathy, lumbar region: Secondary | ICD-10-CM | POA: Diagnosis present

## 2024-02-21 MED ORDER — OXYCODONE-ACETAMINOPHEN 10-325 MG PO TABS: 1.0000 | ORAL_TABLET | Freq: Four times a day (QID) | ORAL | 0 refills | Status: DC | PRN

## 2024-02-21 NOTE — Progress Notes (Signed)
 Subjective:    Patient ID: George Villa, male    DOB: [birth year], 58 y.o.   MRN: [id number]  HPI: George Villa is a 58 y.o. male who returns for follow up appointment for chronic pain and medication refill. He states his pain is located in his lower back. He rates his pain 8. His current exercise regime is walking and performing stretching exercises.  George Villa Morphine  equivalent is 60.00 MME.   Last Oral Swab was Performed on 12/20/2023, it was consistent.      Pain Inventory Average Pain 8 Pain Right Now 8 My pain is intermittent and dull  In the last 24 hours, has pain interfered with the following? General activity 8 Relation with others 8 Enjoyment of life 8 What TIME of day is your pain at its worst? evening Sleep (in general) Fair  Pain is worse with: walking and bending Pain improves with: medication Relief from Meds: 8  Family History  Problem Relation Age of Onset   Heart Problems Father    Hypertension Mother    Stroke Other    Social History   Socioeconomic History   Marital status: Single    Spouse name: Not on file   Number of children: Not on file   Years of education: Not on file   Highest education level: Not on file  Occupational History   Occupation: umemployed     Comment: Works in [employer]   Tobacco Use   Smoking status: Never   Smokeless tobacco: Never  Vaping Use   Vaping status: Never Used  Substance and Sexual Activity   Alcohol  use: Yes    Alcohol /week: 6.0 standard drinks of alcohol     Types: 6 Cans of beer per week    Comment: OCCASSIONALLY    Drug use: No   Sexual activity: Yes  Other Topics Concern   Not on file  Social History Narrative   7 cups of caffeine a week    Social Drivers of Corporate Investment Banker Strain: Not on file  Food Insecurity: Not on file  Transportation Needs: Not on file  Physical Activity: Not on file  Stress: Not on file  Social Connections: Not on file   Past Surgical  History:  Procedure Laterality Date   BACK SURGERY     BRAIN HEMATOMA EVACUATION  2006   LEFT HEART CATHETERIZATION WITH CORONARY ANGIOGRAM N/A 02/17/2014   Procedure: LEFT HEART CATHETERIZATION WITH CORONARY ANGIOGRAM;  Surgeon: Victory LELON Sharps DOUGLAS, MD;  Location: [facility] CATH LAB;  Service: Cardiovascular;  Laterality: N/A;   LUMBAR DISC SURGERY  2013   PERCUTANEOUS CORONARY STENT INTERVENTION (PCI-S)  02/17/2014   Procedure: PERCUTANEOUS CORONARY STENT INTERVENTION (PCI-S);  Surgeon: Victory LELON Sharps DOUGLAS, MD;  Location: [facility] CATH LAB;  Service: Cardiovascular;;   TRACHEOSTOMY  05/2004   VENTRICULOSTOMY  2006   Past Surgical History:  Procedure Laterality Date   BACK SURGERY     BRAIN HEMATOMA EVACUATION  2006   LEFT HEART CATHETERIZATION WITH CORONARY ANGIOGRAM N/A 02/17/2014   Procedure: LEFT HEART CATHETERIZATION WITH CORONARY ANGIOGRAM;  Surgeon: Victory LELON Sharps DOUGLAS, MD;  Location: [facility] CATH LAB;  Service: Cardiovascular;  Laterality: N/A;   LUMBAR DISC SURGERY  2013   PERCUTANEOUS CORONARY STENT INTERVENTION (PCI-S)  02/17/2014   Procedure: PERCUTANEOUS CORONARY STENT INTERVENTION (PCI-S);  Surgeon: Victory LELON Sharps DOUGLAS, MD;  Location: [facility] CATH LAB;  Service: Cardiovascular;;   TRACHEOSTOMY  05/2004   VENTRICULOSTOMY  2006  Past Medical History:  Diagnosis Date   Acid reflux    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  Opioid Risk Score:   Fall Risk Score:  `1  Depression screen PHQ 2/9     12/20/2023    1:22 PM 10/25/2023    1:39 PM 09/27/2023    1:03 PM 08/30/2023    1:06 PM 08/02/2023    1:34 PM 07/05/2023   12:00 PM 06/07/2023    1:40 PM  Depression screen PHQ 2/9  Decreased Interest 0  0 0 1 0 0 0  Down, Depressed, Hopeless 0 0 0 1 0 0 0  PHQ - 2 Score 0 0 0 2 0 0 0  Altered sleeping  0       Tired, decreased energy  1       Change in appetite  0       Feeling bad or failure about yourself   0       Trouble concentrating  0       Moving slowly or fidgety/restless  0       Suicidal thoughts  0       PHQ-9 Score  1        Difficult doing work/chores  Somewhat difficult          Data saved with a previous flowsheet row definition    Review of Systems  Musculoskeletal:  Positive for back pain and gait problem.  All other systems reviewed and are negative.      Objective:   Physical Exam Vitals and nursing note reviewed.  Constitutional:      Appearance: Normal appearance.  Cardiovascular:     Rate and Rhythm: Normal rate and regular rhythm.     Pulses: Normal pulses.     Heart sounds: Normal heart sounds.  Pulmonary:     Effort: Pulmonary effort is normal.     Breath sounds: Normal breath sounds.  Musculoskeletal:     Comments: Normal Muscle Bulk and Muscle Testing Reveals:  Upper Extremities: Full ROM and Muscle Strength 5/5 Lumbar Paraspinal Tenderness: L-3-L-5 Lower Extremities: Full ROM and Muscle Strength 5/5 Arises from Chair slowly using Cane for support Abnormality  Gait     Skin:    General: Skin is warm and dry.  Neurological:     Mental Status: He is alert and oriented to person, place, and time.  Psychiatric:        Mood and Affect: Mood normal.        Behavior: Behavior normal.          Assessment & Plan:  History of multiple strokes with gait disorder, ongoing ataxia and spastic left hemiparesis: Continue  HEP as Tolerated. 02/21/2024 2. Chronic low back pain with documented facet arthropathy and history of intradural defect/schwanomma requiring resection, per Dr. Babs Note.  Continue current medication regimen. Refilled: Percocet 10/325 one q6 prn #120.Second prescription sent for the following month.  02/21/2024  We will  continue the opioid monitoring program, this consists of regular clinic visits, examinations, urine drug screen, pill counts as well as use of [place]  Controlled Substance Reporting system. A 12  month History has been reviewed on the [place]  Controlled Substance Reporting System on 02/21/2024   F/U in 1 month

## 2024-04-17 ENCOUNTER — Encounter: Payer: Self-pay | Admitting: Registered Nurse

## 2024-04-17 ENCOUNTER — Encounter: Attending: Physical Medicine & Rehabilitation | Admitting: Registered Nurse

## 2024-04-17 VITALS — BP 131/93 | HR 92 | Ht 67.0 in | Wt 205.0 lb

## 2024-04-17 DIAGNOSIS — M47816 Spondylosis without myelopathy or radiculopathy, lumbar region: Secondary | ICD-10-CM | POA: Insufficient documentation

## 2024-04-17 DIAGNOSIS — Z5181 Encounter for therapeutic drug level monitoring: Secondary | ICD-10-CM | POA: Diagnosis not present

## 2024-04-17 DIAGNOSIS — R269 Unspecified abnormalities of gait and mobility: Secondary | ICD-10-CM | POA: Insufficient documentation

## 2024-04-17 DIAGNOSIS — G894 Chronic pain syndrome: Secondary | ICD-10-CM | POA: Insufficient documentation

## 2024-04-17 DIAGNOSIS — Z79891 Long term (current) use of opiate analgesic: Secondary | ICD-10-CM | POA: Diagnosis not present

## 2024-04-17 MED ORDER — OXYCODONE-ACETAMINOPHEN 10-325 MG PO TABS: 1.0000 | ORAL_TABLET | Freq: Four times a day (QID) | ORAL | 0 refills | Status: DC | PRN

## 2024-04-17 MED ORDER — OXYCODONE-ACETAMINOPHEN 10-325 MG PO TABS: 1.0000 | ORAL_TABLET | Freq: Four times a day (QID) | ORAL | 0 refills | Status: AC | PRN

## 2024-04-17 NOTE — Progress Notes (Signed)
 "  Subjective:    Patient ID: George Villa, male    DOB: Jun 10, 1965, 59 y.o.   MRN: [id number]  HPI: George Villa is a 59 y.o. male who returns for follow up appointment for chronic pain and medication refill. He states his pain is located in his lower back. He rates his pain 8. His current exercise regime is walking and riding his stationary bicycle for 30 minutes 5 days a week.   George Villa Morphine  equivalent is 60.00 MME.   Oral Swab was Performed today.     Pain Inventory Average Pain 8 Pain Right Now 8 My pain is dull  In the last 24 hours, has pain interfered with the following? General activity 8 Relation with others 8 Enjoyment of life 8 What TIME of day is your pain at its worst? evening Sleep (in general) Fair  Pain is worse with: . Pain improves with: rest and medication Relief from Meds: 8  Family History  Problem Relation Age of Onset   Heart Problems Father    Hypertension Mother    Stroke Other    Social History   Socioeconomic History   Marital status: Single    Spouse name: Not on file   Number of children: Not on file   Years of education: Not on file   Highest education level: Not on file  Occupational History   Occupation: umemployed     Comment: Works in [employer]   Tobacco Use   Smoking status: Never   Smokeless tobacco: Never  Vaping Use   Vaping status: Never Used  Substance and Sexual Activity   Alcohol  use: Yes    Alcohol /week: 6.0 standard drinks of alcohol     Types: 6 Cans of beer per week    Comment: OCCASSIONALLY    Drug use: No   Sexual activity: Yes  Other Topics Concern   Not on file  Social History Narrative   7 cups of caffeine a week    Social Drivers of Health   Tobacco Use: Low Risk (12/30/2023)   Patient History    Smoking Tobacco Use: Never    Smokeless Tobacco Use: Never    Passive Exposure: Not on file  Financial Resource Strain: Not on file  Food Insecurity: Not on file  Transportation Needs: Not  on file  Physical Activity: Not on file  Stress: Not on file  Social Connections: Not on file  Depression (PHQ2-9): Low Risk (02/21/2024)   Depression (PHQ2-9)    PHQ-2 Score: 0  Alcohol  Screen: Not on file  Housing: Not on file  Utilities: Not on file  Health Literacy: Not on file   Past Surgical History:  Procedure Laterality Date   BACK SURGERY     BRAIN HEMATOMA EVACUATION  2006   LEFT HEART CATHETERIZATION WITH CORONARY ANGIOGRAM N/A 02/17/2014   Procedure: LEFT HEART CATHETERIZATION WITH CORONARY ANGIOGRAM;  Surgeon: Victory LELON Sharps DOUGLAS, MD;  Location: [facility] CATH LAB;  Service: Cardiovascular;  Laterality: N/A;   LUMBAR DISC SURGERY  2013   PERCUTANEOUS CORONARY STENT INTERVENTION (PCI-S)  02/17/2014   Procedure: PERCUTANEOUS CORONARY STENT INTERVENTION (PCI-S);  Surgeon: Victory LELON Sharps DOUGLAS, MD;  Location: [facility] CATH LAB;  Service: Cardiovascular;;   TRACHEOSTOMY  05/2004   VENTRICULOSTOMY  2006   Past Surgical History:  Procedure Laterality Date   BACK SURGERY     BRAIN HEMATOMA EVACUATION  2006   LEFT HEART CATHETERIZATION WITH CORONARY ANGIOGRAM N/A 02/17/2014   Procedure: LEFT HEART CATHETERIZATION  WITH CORONARY ANGIOGRAM;  Surgeon: Victory LELON Claudene DOUGLAS, MD;  Location: [facility] CATH LAB;  Service: Cardiovascular;  Laterality: N/A;   LUMBAR DISC SURGERY  2013   PERCUTANEOUS CORONARY STENT INTERVENTION (PCI-S)  02/17/2014   Procedure: PERCUTANEOUS CORONARY STENT INTERVENTION (PCI-S);  Surgeon: Victory LELON Claudene DOUGLAS, MD;  Location: [facility] CATH LAB;  Service: Cardiovascular;;   TRACHEOSTOMY  05/2004   VENTRICULOSTOMY  2006   Past Medical History:  Diagnosis Date   Acid reflux    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   thelbert 02/17/2014   Stroke (HCC) 2006   left leg weak since (02/18/2014)   BP (!) 149/92    Pulse (!) 102   Ht 5' 7 (1.702 m)   Wt 205 lb (93 kg)   SpO2 97%   BMI 32.11 kg/m   Opioid Risk Score:   Fall Risk Score:  `1  Depression screen PHQ 2/9     02/21/2024    1:07 PM 12/20/2023    1:22 PM 10/25/2023    1:39 PM 09/27/2023    1:03 PM 08/30/2023    1:06 PM 08/02/2023    1:34 PM 07/05/2023   12:00 PM  Depression screen PHQ 2/9  Decreased Interest 0 0 0 0 1 0 0  Down, Depressed, Hopeless 0 0 0 0 1 0 0  PHQ - 2 Score 0 0 0 0 2 0 0  Altered sleeping   0      Tired, decreased energy   1      Change in appetite   0      Feeling bad or failure about yourself    0      Trouble concentrating   0      Moving slowly or fidgety/restless   0      Suicidal thoughts   0      PHQ-9 Score   1       Difficult doing work/chores   Somewhat difficult         Data saved with a previous flowsheet row definition     Review of Systems  Musculoskeletal:  Positive for back pain.  All other systems reviewed and are negative.      Objective:   Physical Exam Vitals and nursing note reviewed.  Constitutional:      Appearance: Normal appearance.  Cardiovascular:     Rate and Rhythm: Normal rate and regular rhythm.     Pulses: Normal pulses.     Heart sounds: Normal heart sounds.  Pulmonary:     Effort: Pulmonary effort is normal.     Breath sounds: Normal breath sounds.  Musculoskeletal:     Comments: Normal Muscle Bulk and Muscle Testing Reveals:  Upper Extremities: Full ROM and Muscle Strength 5/5  Lumbar Paraspinal Tenderness: L-3-L-5 Lower Extremities: Full ROM and Muscle Strength 5/5 Arises from chair slowly using cane for support Abnormality of  Gait     Skin:    General: Skin is warm and dry.  Neurological:     Mental Status: He is alert and oriented to person, place, and time.  Psychiatric:        Mood and Affect: Mood normal.        Behavior: Behavior normal.  Assessment & Plan:  History of multiple strokes with gait disorder, ongoing ataxia and  spastic left hemiparesis: Continue  HEP as Tolerated. 04/17/2024 2. Chronic low back pain with documented facet arthropathy and history of intradural defect/schwanomma requiring resection, per Dr. Babs Note.  Continue current medication regimen. Refilled: Percocet 10/325 one q6 prn #120.Second prescription sent for the following month.  04/17/2024  We will continue the opioid monitoring program, this consists of regular clinic visits, examinations, urine drug screen, pill counts as well as use of [place]  Controlled Substance Reporting system. A 12 month History has been reviewed on the   Controlled Substance Reporting System on 04/17/2024   F/U in 1 month  "

## 2024-04-21 LAB — DRUG TOX MONITOR 1 W/CONF, ORAL FLD
Amphetamines: NEGATIVE ng/mL
Barbiturates: NEGATIVE ng/mL
Benzodiazepines: NEGATIVE ng/mL
Buprenorphine: NEGATIVE ng/mL
Cocaine: NEGATIVE ng/mL
Codeine: NEGATIVE ng/mL
Dihydrocodeine: NEGATIVE ng/mL
Fentanyl: NEGATIVE ng/mL
Heroin Metabolite: NEGATIVE ng/mL
Hydrocodone: NEGATIVE ng/mL
Hydromorphone: NEGATIVE ng/mL
MARIJUANA: NEGATIVE ng/mL
MDMA: NEGATIVE ng/mL
Meprobamate: NEGATIVE ng/mL
Methadone: NEGATIVE ng/mL
Morphine: NEGATIVE ng/mL
Nicotine Metabolite: NEGATIVE ng/mL
Norhydrocodone: NEGATIVE ng/mL
Noroxycodone: 16 ng/mL — ABNORMAL HIGH
Opiates: POSITIVE ng/mL — AB
Oxycodone: 76.7 ng/mL — ABNORMAL HIGH
Oxymorphone: NEGATIVE ng/mL
Phencyclidine: NEGATIVE ng/mL
Tapentadol: NEGATIVE ng/mL
Tramadol: NEGATIVE ng/mL
Zolpidem: NEGATIVE ng/mL

## 2024-04-21 LAB — DRUG TOX ALC METAB W/CON, ORAL FLD: Alcohol Metabolite: NEGATIVE ng/mL

## 2024-06-12 ENCOUNTER — Encounter: Admitting: Registered Nurse
